# Patient Record
Sex: Male | Born: 1960 | Race: White | Hispanic: No | Marital: Married | Smoking: Former smoker
Health system: Southern US, Community
[De-identification: ages and names within clinical notes are randomized; demographics above are authoritative.]

## PROBLEM LIST (undated history)

## (undated) ENCOUNTER — Emergency Department (HOSPITAL_COMMUNITY): Disposition: A | Discharge status: Home / Self Care | Payer: BC Managed Care – PPO

## (undated) DIAGNOSIS — F17200 Nicotine dependence, unspecified, uncomplicated: Secondary | ICD-10-CM

## (undated) DIAGNOSIS — F1010 Alcohol abuse, uncomplicated: Secondary | ICD-10-CM

## (undated) DIAGNOSIS — K645 Perianal venous thrombosis: Secondary | ICD-10-CM

## (undated) DIAGNOSIS — G4730 Sleep apnea, unspecified: Secondary | ICD-10-CM

## (undated) DIAGNOSIS — L723 Sebaceous cyst: Secondary | ICD-10-CM

## (undated) DIAGNOSIS — J984 Other disorders of lung: Secondary | ICD-10-CM

## (undated) DIAGNOSIS — E079 Disorder of thyroid, unspecified: Secondary | ICD-10-CM

## (undated) DIAGNOSIS — R21 Rash and other nonspecific skin eruption: Secondary | ICD-10-CM

## (undated) DIAGNOSIS — F411 Generalized anxiety disorder: Secondary | ICD-10-CM

## (undated) DIAGNOSIS — I10 Essential (primary) hypertension: Secondary | ICD-10-CM

## (undated) DIAGNOSIS — R05 Cough: Secondary | ICD-10-CM

## (undated) DIAGNOSIS — M79606 Pain in leg, unspecified: Secondary | ICD-10-CM

## (undated) DIAGNOSIS — E785 Hyperlipidemia, unspecified: Secondary | ICD-10-CM

## (undated) DIAGNOSIS — E119 Type 2 diabetes mellitus without complications: Secondary | ICD-10-CM

## (undated) DIAGNOSIS — M109 Gout, unspecified: Secondary | ICD-10-CM

## (undated) DIAGNOSIS — M1990 Unspecified osteoarthritis, unspecified site: Secondary | ICD-10-CM

## (undated) DIAGNOSIS — N498 Inflammatory disorders of other specified male genital organs: Secondary | ICD-10-CM

## (undated) DIAGNOSIS — E039 Hypothyroidism, unspecified: Secondary | ICD-10-CM

## (undated) HISTORY — DX: Type 2 diabetes mellitus without complications: E11.9

## (undated) HISTORY — DX: Disorder of thyroid, unspecified: E07.9

## (undated) HISTORY — DX: Sleep apnea, unspecified: G47.30

## (undated) HISTORY — DX: Unspecified osteoarthritis, unspecified site: M19.90

## (undated) HISTORY — DX: Hyperlipidemia, unspecified: E78.5

## (undated) HISTORY — DX: Pain in leg, unspecified: M79.606

## (undated) HISTORY — DX: Essential (primary) hypertension: I10

## (undated) HISTORY — DX: Inflammatory disorders of other specified male genital organs: N49.8

## (undated) HISTORY — DX: Gout, unspecified: M10.9

## (undated) HISTORY — DX: Sebaceous cyst: L72.3

## (undated) HISTORY — DX: Other disorders of lung: J98.4

## (undated) HISTORY — PX: NASAL SEPTUM SURGERY: SHX37

## (undated) HISTORY — DX: Generalized anxiety disorder: F41.1

## (undated) HISTORY — PX: COLONOSCOPY: SHX174

## (undated) HISTORY — DX: Rash and other nonspecific skin eruption: R21

## (undated) HISTORY — DX: Alcohol abuse, uncomplicated: F10.10

## (undated) HISTORY — DX: Cough: R05

## (undated) HISTORY — DX: Nicotine dependence, unspecified, uncomplicated: F17.200

## (undated) HISTORY — DX: Perianal venous thrombosis: K64.5

## (undated) HISTORY — DX: Hypothyroidism, unspecified: E03.9

---

## 1998-07-04 ENCOUNTER — Other Ambulatory Visit: Admission: RE | Admit: 1998-07-04 | Discharge: 1998-07-04 | Payer: Self-pay | Admitting: Urology

## 1998-11-21 ENCOUNTER — Ambulatory Visit (HOSPITAL_COMMUNITY): Admission: RE | Admit: 1998-11-21 | Discharge: 1998-11-21 | Payer: Self-pay | Admitting: Neurosurgery

## 1998-12-04 ENCOUNTER — Ambulatory Visit (HOSPITAL_COMMUNITY): Admission: RE | Admit: 1998-12-04 | Discharge: 1998-12-04 | Payer: Self-pay | Admitting: Neurosurgery

## 1998-12-19 ENCOUNTER — Ambulatory Visit (HOSPITAL_COMMUNITY): Admission: RE | Admit: 1998-12-19 | Discharge: 1998-12-19 | Payer: Self-pay | Admitting: Neurosurgery

## 1999-03-16 ENCOUNTER — Ambulatory Visit: Admission: RE | Admit: 1999-03-16 | Discharge: 1999-03-16 | Payer: Self-pay | Admitting: Endocrinology

## 1999-12-16 ENCOUNTER — Encounter: Admission: RE | Admit: 1999-12-16 | Discharge: 2000-03-15 | Payer: Self-pay | Admitting: Endocrinology

## 2002-02-14 ENCOUNTER — Ambulatory Visit (HOSPITAL_COMMUNITY): Admission: RE | Admit: 2002-02-14 | Discharge: 2002-02-14 | Payer: Self-pay | Admitting: Endocrinology

## 2002-02-14 ENCOUNTER — Encounter: Payer: Self-pay | Admitting: Endocrinology

## 2002-06-16 ENCOUNTER — Emergency Department (HOSPITAL_COMMUNITY): Admission: EM | Admit: 2002-06-16 | Discharge: 2002-06-16 | Payer: Self-pay | Admitting: Emergency Medicine

## 2002-10-02 ENCOUNTER — Ambulatory Visit (HOSPITAL_COMMUNITY): Admission: RE | Admit: 2002-10-02 | Discharge: 2002-10-02 | Payer: Self-pay | Admitting: Endocrinology

## 2002-10-02 ENCOUNTER — Encounter: Payer: Self-pay | Admitting: Endocrinology

## 2003-06-13 ENCOUNTER — Encounter: Admission: RE | Admit: 2003-06-13 | Discharge: 2003-06-13 | Payer: Self-pay | Admitting: Endocrinology

## 2003-06-26 ENCOUNTER — Ambulatory Visit (HOSPITAL_BASED_OUTPATIENT_CLINIC_OR_DEPARTMENT_OTHER): Admission: RE | Admit: 2003-06-26 | Discharge: 2003-06-26 | Payer: Self-pay | Admitting: Internal Medicine

## 2003-08-29 ENCOUNTER — Ambulatory Visit (HOSPITAL_COMMUNITY): Admission: RE | Admit: 2003-08-29 | Discharge: 2003-08-30 | Payer: Self-pay | Admitting: Otolaryngology

## 2004-04-19 ENCOUNTER — Emergency Department (HOSPITAL_COMMUNITY): Admission: EM | Admit: 2004-04-19 | Discharge: 2004-04-19 | Payer: Self-pay | Admitting: Emergency Medicine

## 2004-07-13 ENCOUNTER — Ambulatory Visit: Payer: Self-pay | Admitting: Endocrinology

## 2004-08-01 ENCOUNTER — Ambulatory Visit: Payer: Self-pay | Admitting: Family Medicine

## 2004-10-17 ENCOUNTER — Ambulatory Visit: Payer: Self-pay | Admitting: Internal Medicine

## 2004-10-30 ENCOUNTER — Ambulatory Visit: Payer: Self-pay | Admitting: Endocrinology

## 2004-11-09 ENCOUNTER — Ambulatory Visit: Payer: Self-pay | Admitting: Professional

## 2004-11-19 ENCOUNTER — Ambulatory Visit: Payer: Self-pay | Admitting: Professional

## 2004-12-10 ENCOUNTER — Ambulatory Visit: Payer: Self-pay | Admitting: Professional

## 2004-12-17 ENCOUNTER — Ambulatory Visit: Payer: Self-pay | Admitting: Professional

## 2005-01-14 ENCOUNTER — Ambulatory Visit: Payer: Self-pay | Admitting: Professional

## 2005-01-26 ENCOUNTER — Ambulatory Visit: Payer: Self-pay | Admitting: Endocrinology

## 2005-01-28 ENCOUNTER — Ambulatory Visit: Payer: Self-pay | Admitting: Professional

## 2005-02-10 ENCOUNTER — Ambulatory Visit: Payer: Self-pay | Admitting: Critical Care Medicine

## 2005-03-26 ENCOUNTER — Ambulatory Visit: Payer: Self-pay | Admitting: Endocrinology

## 2005-03-29 ENCOUNTER — Ambulatory Visit: Payer: Self-pay | Admitting: Endocrinology

## 2005-04-29 ENCOUNTER — Ambulatory Visit: Payer: Self-pay | Admitting: Endocrinology

## 2005-11-08 ENCOUNTER — Ambulatory Visit: Payer: Self-pay | Admitting: Endocrinology

## 2005-11-10 ENCOUNTER — Ambulatory Visit: Payer: Self-pay | Admitting: Endocrinology

## 2006-07-05 ENCOUNTER — Ambulatory Visit: Payer: Self-pay | Admitting: Endocrinology

## 2006-07-05 LAB — CONVERTED CEMR LAB
AST: 42 units/L — ABNORMAL HIGH (ref 0–37)
Albumin: 4 g/dL (ref 3.5–5.2)
BUN: 7 mg/dL (ref 6–23)
Basophils Absolute: 0.1 10*3/uL (ref 0.0–0.1)
Bilirubin Urine: NEGATIVE
CO2: 28 meq/L (ref 19–32)
Chloride: 101 meq/L (ref 96–112)
Eosinophil percent: 3 % (ref 0.0–5.0)
Glomerular Filtration Rate, Af Am: 157 mL/min/{1.73_m2}
HCT: 43.1 % (ref 39.0–52.0)
Hemoglobin, Urine: NEGATIVE
Ketones, ur: NEGATIVE mg/dL
Leukocytes, UA: NEGATIVE
Lymphocytes Relative: 24.7 % (ref 12.0–46.0)
MCV: 88.3 fL (ref 78.0–100.0)
Neutro Abs: 5.3 10*3/uL (ref 1.4–7.7)
Platelets: 244 10*3/uL (ref 150–400)
RDW: 12.5 % (ref 11.5–14.6)
Sodium: 138 meq/L (ref 135–145)
TSH: 3.61 microintl units/mL (ref 0.35–5.50)
Total Protein: 6.6 g/dL (ref 6.0–8.3)
Urine Glucose: NEGATIVE mg/dL
Urobilinogen, UA: 0.2 (ref 0.0–1.0)
VLDL: 79 mg/dL — ABNORMAL HIGH (ref 0–40)

## 2006-07-11 ENCOUNTER — Ambulatory Visit: Payer: Self-pay | Admitting: Endocrinology

## 2006-11-16 ENCOUNTER — Ambulatory Visit: Payer: Self-pay | Admitting: Endocrinology

## 2007-04-11 ENCOUNTER — Ambulatory Visit: Payer: Self-pay | Admitting: Endocrinology

## 2007-05-31 ENCOUNTER — Ambulatory Visit: Payer: Self-pay | Admitting: Endocrinology

## 2007-05-31 LAB — CONVERTED CEMR LAB
ALT: 58 units/L — ABNORMAL HIGH (ref 0–53)
Alkaline Phosphatase: 66 units/L (ref 39–117)
Calcium: 9.1 mg/dL (ref 8.4–10.5)
Creatinine, Ser: 0.6 mg/dL (ref 0.4–1.5)
Direct LDL: 53 mg/dL
Glucose, Bld: 190 mg/dL — ABNORMAL HIGH (ref 70–99)
Hgb A1c MFr Bld: 9 % — ABNORMAL HIGH (ref 4.6–6.0)
Potassium: 4.1 meq/L (ref 3.5–5.1)
TSH: 6.39 microintl units/mL — ABNORMAL HIGH (ref 0.35–5.50)
Total Bilirubin: 1.4 mg/dL — ABNORMAL HIGH (ref 0.3–1.2)
Total Protein: 6.6 g/dL (ref 6.0–8.3)
Triglycerides: 441 mg/dL (ref 0–149)
VLDL: 88 mg/dL — ABNORMAL HIGH (ref 0–40)

## 2007-06-02 ENCOUNTER — Ambulatory Visit: Payer: Self-pay | Admitting: Endocrinology

## 2007-06-07 ENCOUNTER — Telehealth (INDEPENDENT_AMBULATORY_CARE_PROVIDER_SITE_OTHER): Payer: Self-pay | Admitting: *Deleted

## 2007-08-21 ENCOUNTER — Encounter: Payer: Self-pay | Admitting: Endocrinology

## 2007-09-19 ENCOUNTER — Ambulatory Visit: Payer: Self-pay | Admitting: Endocrinology

## 2007-09-19 DIAGNOSIS — E119 Type 2 diabetes mellitus without complications: Secondary | ICD-10-CM

## 2007-09-19 DIAGNOSIS — I10 Essential (primary) hypertension: Secondary | ICD-10-CM

## 2007-09-19 DIAGNOSIS — E039 Hypothyroidism, unspecified: Secondary | ICD-10-CM

## 2007-09-19 DIAGNOSIS — F418 Other specified anxiety disorders: Secondary | ICD-10-CM | POA: Insufficient documentation

## 2007-09-19 DIAGNOSIS — F411 Generalized anxiety disorder: Secondary | ICD-10-CM

## 2007-09-19 DIAGNOSIS — M1990 Unspecified osteoarthritis, unspecified site: Secondary | ICD-10-CM | POA: Insufficient documentation

## 2007-09-19 DIAGNOSIS — E785 Hyperlipidemia, unspecified: Secondary | ICD-10-CM

## 2007-09-19 DIAGNOSIS — R21 Rash and other nonspecific skin eruption: Secondary | ICD-10-CM | POA: Insufficient documentation

## 2007-09-19 HISTORY — DX: Hyperlipidemia, unspecified: E78.5

## 2007-09-19 HISTORY — DX: Generalized anxiety disorder: F41.1

## 2007-09-19 HISTORY — DX: Type 2 diabetes mellitus without complications: E11.9

## 2007-09-19 HISTORY — DX: Rash and other nonspecific skin eruption: R21

## 2007-09-19 HISTORY — DX: Essential (primary) hypertension: I10

## 2007-09-19 HISTORY — DX: Hypothyroidism, unspecified: E03.9

## 2007-09-19 HISTORY — DX: Unspecified osteoarthritis, unspecified site: M19.90

## 2007-10-06 ENCOUNTER — Telehealth (INDEPENDENT_AMBULATORY_CARE_PROVIDER_SITE_OTHER): Payer: Self-pay | Admitting: *Deleted

## 2007-10-09 ENCOUNTER — Telehealth (INDEPENDENT_AMBULATORY_CARE_PROVIDER_SITE_OTHER): Payer: Self-pay | Admitting: *Deleted

## 2007-10-24 ENCOUNTER — Encounter: Payer: Self-pay | Admitting: Endocrinology

## 2008-04-06 ENCOUNTER — Encounter: Payer: Self-pay | Admitting: Endocrinology

## 2008-04-08 ENCOUNTER — Telehealth (INDEPENDENT_AMBULATORY_CARE_PROVIDER_SITE_OTHER): Payer: Self-pay | Admitting: *Deleted

## 2008-04-11 ENCOUNTER — Ambulatory Visit: Payer: Self-pay | Admitting: Endocrinology

## 2008-05-16 ENCOUNTER — Telehealth (INDEPENDENT_AMBULATORY_CARE_PROVIDER_SITE_OTHER): Payer: Self-pay | Admitting: *Deleted

## 2008-05-20 ENCOUNTER — Telehealth (INDEPENDENT_AMBULATORY_CARE_PROVIDER_SITE_OTHER): Payer: Self-pay | Admitting: *Deleted

## 2008-05-22 ENCOUNTER — Ambulatory Visit: Payer: Self-pay | Admitting: Endocrinology

## 2008-05-22 DIAGNOSIS — R05 Cough: Secondary | ICD-10-CM

## 2008-05-22 DIAGNOSIS — R059 Cough, unspecified: Secondary | ICD-10-CM

## 2008-05-22 HISTORY — DX: Cough, unspecified: R05.9

## 2008-05-28 ENCOUNTER — Encounter (INDEPENDENT_AMBULATORY_CARE_PROVIDER_SITE_OTHER): Payer: Self-pay | Admitting: *Deleted

## 2008-05-30 ENCOUNTER — Ambulatory Visit: Payer: Self-pay | Admitting: Endocrinology

## 2008-06-02 LAB — CONVERTED CEMR LAB: Hgb A1c MFr Bld: 11.3 % — ABNORMAL HIGH

## 2008-06-28 ENCOUNTER — Ambulatory Visit: Payer: Self-pay | Admitting: Endocrinology

## 2008-07-05 ENCOUNTER — Telehealth (INDEPENDENT_AMBULATORY_CARE_PROVIDER_SITE_OTHER): Payer: Self-pay | Admitting: *Deleted

## 2008-08-01 ENCOUNTER — Ambulatory Visit: Payer: Self-pay | Admitting: Endocrinology

## 2008-08-01 ENCOUNTER — Encounter: Payer: Self-pay | Admitting: Internal Medicine

## 2008-08-01 DIAGNOSIS — L723 Sebaceous cyst: Secondary | ICD-10-CM

## 2008-08-01 HISTORY — DX: Sebaceous cyst: L72.3

## 2008-08-23 ENCOUNTER — Ambulatory Visit: Payer: Self-pay | Admitting: Internal Medicine

## 2008-08-23 DIAGNOSIS — K645 Perianal venous thrombosis: Secondary | ICD-10-CM

## 2008-08-23 HISTORY — DX: Perianal venous thrombosis: K64.5

## 2008-09-17 ENCOUNTER — Telehealth (INDEPENDENT_AMBULATORY_CARE_PROVIDER_SITE_OTHER): Payer: Self-pay | Admitting: *Deleted

## 2008-09-26 ENCOUNTER — Encounter: Payer: Self-pay | Admitting: Endocrinology

## 2008-10-09 ENCOUNTER — Telehealth (INDEPENDENT_AMBULATORY_CARE_PROVIDER_SITE_OTHER): Payer: Self-pay | Admitting: *Deleted

## 2008-10-10 ENCOUNTER — Telehealth (INDEPENDENT_AMBULATORY_CARE_PROVIDER_SITE_OTHER): Payer: Self-pay | Admitting: *Deleted

## 2008-10-22 ENCOUNTER — Telehealth: Payer: Self-pay | Admitting: Endocrinology

## 2008-10-22 ENCOUNTER — Ambulatory Visit: Payer: Self-pay | Admitting: Endocrinology

## 2008-10-23 LAB — CONVERTED CEMR LAB
Cholesterol: 174 mg/dL (ref 0–200)
Direct LDL: 53.7 mg/dL
Hgb A1c MFr Bld: 10.2 % — ABNORMAL HIGH (ref 4.6–6.0)
Triglycerides: 670 mg/dL (ref 0–149)
VLDL: 134 mg/dL — ABNORMAL HIGH (ref 0–40)

## 2008-10-24 ENCOUNTER — Ambulatory Visit: Payer: Self-pay | Admitting: Endocrinology

## 2008-10-24 DIAGNOSIS — F1010 Alcohol abuse, uncomplicated: Secondary | ICD-10-CM

## 2008-10-24 HISTORY — DX: Alcohol abuse, uncomplicated: F10.10

## 2008-11-04 ENCOUNTER — Telehealth (INDEPENDENT_AMBULATORY_CARE_PROVIDER_SITE_OTHER): Payer: Self-pay | Admitting: *Deleted

## 2008-11-18 ENCOUNTER — Telehealth (INDEPENDENT_AMBULATORY_CARE_PROVIDER_SITE_OTHER): Payer: Self-pay | Admitting: *Deleted

## 2008-12-27 ENCOUNTER — Telehealth (INDEPENDENT_AMBULATORY_CARE_PROVIDER_SITE_OTHER): Payer: Self-pay | Admitting: *Deleted

## 2009-01-09 ENCOUNTER — Telehealth: Payer: Self-pay | Admitting: Endocrinology

## 2009-01-31 ENCOUNTER — Telehealth (INDEPENDENT_AMBULATORY_CARE_PROVIDER_SITE_OTHER): Payer: Self-pay | Admitting: *Deleted

## 2009-01-31 ENCOUNTER — Ambulatory Visit: Payer: Self-pay | Admitting: Endocrinology

## 2009-02-01 LAB — CONVERTED CEMR LAB
Basophils Relative: 0 % (ref 0.0–3.0)
Bilirubin Urine: NEGATIVE
CO2: 29 meq/L (ref 19–32)
Cholesterol: 161 mg/dL (ref 0–200)
Direct LDL: 48.8 mg/dL
Eosinophils Absolute: 0.3 10*3/uL (ref 0.0–0.7)
Eosinophils Relative: 4.2 % (ref 0.0–5.0)
GFR calc non Af Amer: 152.86 mL/min (ref >60)
Glucose, Bld: 141 mg/dL — ABNORMAL HIGH (ref 70–99)
HCT: 41.7 % (ref 39.0–52.0)
Hemoglobin, Urine: NEGATIVE
Hemoglobin: 14.9 g/dL (ref 13.0–17.0)
Monocytes Relative: 7.9 % (ref 3.0–12.0)
Neutro Abs: 3.6 10*3/uL (ref 1.4–7.7)
PSA: 3.88 ng/mL (ref 0.10–4.00)
Platelets: 191 10*3/uL (ref 150.0–400.0)
Potassium: 3.8 meq/L (ref 3.5–5.1)
RDW: 12.7 % (ref 11.5–14.6)
TSH: 8.47 microintl units/mL — ABNORMAL HIGH (ref 0.35–5.50)
Total CHOL/HDL Ratio: 4
Triglycerides: 746 mg/dL — ABNORMAL HIGH (ref 0.0–149.0)
Urine Glucose: 250 mg/dL
WBC: 6.5 10*3/uL (ref 4.5–10.5)

## 2009-02-03 ENCOUNTER — Ambulatory Visit: Payer: Self-pay | Admitting: Endocrinology

## 2009-05-30 ENCOUNTER — Telehealth: Payer: Self-pay | Admitting: Endocrinology

## 2009-06-13 ENCOUNTER — Telehealth: Payer: Self-pay | Admitting: Endocrinology

## 2009-08-21 ENCOUNTER — Telehealth: Payer: Self-pay | Admitting: Endocrinology

## 2009-08-22 ENCOUNTER — Telehealth: Payer: Self-pay | Admitting: Endocrinology

## 2009-08-22 DIAGNOSIS — G4730 Sleep apnea, unspecified: Secondary | ICD-10-CM | POA: Insufficient documentation

## 2009-08-22 HISTORY — DX: Sleep apnea, unspecified: G47.30

## 2009-09-02 ENCOUNTER — Ambulatory Visit: Payer: Self-pay | Admitting: Pulmonary Disease

## 2009-09-02 ENCOUNTER — Telehealth: Payer: Self-pay | Admitting: Internal Medicine

## 2009-09-12 ENCOUNTER — Ambulatory Visit: Payer: Self-pay | Admitting: Endocrinology

## 2009-09-12 LAB — CONVERTED CEMR LAB
Cholesterol: 134 mg/dL (ref 0–200)
Triglycerides: 447 mg/dL — ABNORMAL HIGH (ref 0.0–149.0)
VLDL: 89.4 mg/dL — ABNORMAL HIGH (ref 0.0–40.0)

## 2009-09-17 ENCOUNTER — Ambulatory Visit: Payer: Self-pay | Admitting: Endocrinology

## 2009-10-16 ENCOUNTER — Ambulatory Visit: Payer: Self-pay | Admitting: Endocrinology

## 2010-01-05 ENCOUNTER — Telehealth: Payer: Self-pay | Admitting: Endocrinology

## 2010-01-19 ENCOUNTER — Telehealth: Payer: Self-pay | Admitting: Endocrinology

## 2010-01-22 ENCOUNTER — Telehealth: Payer: Self-pay | Admitting: Endocrinology

## 2010-03-23 ENCOUNTER — Telehealth: Payer: Self-pay | Admitting: Endocrinology

## 2010-03-27 ENCOUNTER — Telehealth: Payer: Self-pay | Admitting: Endocrinology

## 2010-04-14 ENCOUNTER — Telehealth: Payer: Self-pay | Admitting: Endocrinology

## 2010-04-16 ENCOUNTER — Ambulatory Visit: Payer: Self-pay | Admitting: Endocrinology

## 2010-04-16 ENCOUNTER — Telehealth: Payer: Self-pay | Admitting: Endocrinology

## 2010-04-16 LAB — CONVERTED CEMR LAB
Cholesterol: 159 mg/dL (ref 0–200)
Direct LDL: 72 mg/dL
Hgb A1c MFr Bld: 10.6 % — ABNORMAL HIGH (ref 4.6–6.5)
Triglycerides: 479 mg/dL — ABNORMAL HIGH (ref 0.0–149.0)
VLDL: 95.8 mg/dL — ABNORMAL HIGH (ref 0.0–40.0)

## 2010-04-17 ENCOUNTER — Ambulatory Visit: Payer: Self-pay | Admitting: Endocrinology

## 2010-04-17 ENCOUNTER — Encounter: Payer: Self-pay | Admitting: Endocrinology

## 2010-04-17 DIAGNOSIS — M109 Gout, unspecified: Secondary | ICD-10-CM

## 2010-04-17 DIAGNOSIS — F17200 Nicotine dependence, unspecified, uncomplicated: Secondary | ICD-10-CM

## 2010-04-17 HISTORY — DX: Nicotine dependence, unspecified, uncomplicated: F17.200

## 2010-04-17 HISTORY — DX: Gout, unspecified: M10.9

## 2010-04-30 ENCOUNTER — Telehealth: Payer: Self-pay | Admitting: Endocrinology

## 2010-05-27 ENCOUNTER — Ambulatory Visit: Payer: Self-pay | Admitting: Endocrinology

## 2010-05-27 DIAGNOSIS — N498 Inflammatory disorders of other specified male genital organs: Secondary | ICD-10-CM | POA: Insufficient documentation

## 2010-05-27 HISTORY — DX: Inflammatory disorders of other specified male genital organs: N49.8

## 2010-06-10 ENCOUNTER — Encounter: Payer: Self-pay | Admitting: Endocrinology

## 2010-06-10 LAB — CONVERTED CEMR LAB: Uric Acid, Serum: 7 mg/dL (ref 4.0–7.8)

## 2010-06-11 LAB — CONVERTED CEMR LAB: Fructosamine: 345 micromoles/L — ABNORMAL HIGH (ref <285)

## 2010-07-16 ENCOUNTER — Ambulatory Visit: Payer: Self-pay | Admitting: Endocrinology

## 2010-07-16 LAB — CONVERTED CEMR LAB
ALT: 27 units/L (ref 0–53)
BUN: 15 mg/dL (ref 6–23)
Basophils Absolute: 0 10*3/uL (ref 0.0–0.1)
Bilirubin Urine: NEGATIVE
Bilirubin, Direct: 0.1 mg/dL (ref 0.0–0.3)
Chloride: 96 meq/L (ref 96–112)
Cholesterol: 163 mg/dL (ref 0–200)
Creatinine, Ser: 0.7 mg/dL (ref 0.4–1.5)
Direct LDL: 63.5 mg/dL
Eosinophils Absolute: 0.2 10*3/uL (ref 0.0–0.7)
Eosinophils Relative: 2.9 % (ref 0.0–5.0)
Glucose, Bld: 277 mg/dL — ABNORMAL HIGH (ref 70–99)
HCT: 43.2 % (ref 39.0–52.0)
HDL: 37 mg/dL — ABNORMAL LOW (ref >39.00)
Leukocytes, UA: NEGATIVE
Lymphs Abs: 2.2 10*3/uL (ref 0.7–4.0)
MCV: 86.6 fL (ref 78.0–100.0)
Monocytes Absolute: 0.7 10*3/uL (ref 0.1–1.0)
Neutrophils Relative %: 61.5 % (ref 43.0–77.0)
Nitrite: NEGATIVE
PSA: 3.66 ng/mL (ref 0.10–4.00)
Platelets: 243 10*3/uL (ref 150.0–400.0)
RDW: 13.4 % (ref 11.5–14.6)
Specific Gravity, Urine: 1.02 (ref 1.000–1.030)
TSH: 3.86 microintl units/mL (ref 0.35–5.50)
Total Bilirubin: 0.6 mg/dL (ref 0.3–1.2)
VLDL: 115.8 mg/dL — ABNORMAL HIGH (ref 0.0–40.0)
WBC: 8.1 10*3/uL (ref 4.5–10.5)
pH: 6 (ref 5.0–8.0)

## 2010-07-20 ENCOUNTER — Encounter: Payer: Self-pay | Admitting: Internal Medicine

## 2010-07-20 ENCOUNTER — Ambulatory Visit: Payer: Self-pay | Admitting: Endocrinology

## 2010-07-20 DIAGNOSIS — R0989 Other specified symptoms and signs involving the circulatory and respiratory systems: Secondary | ICD-10-CM | POA: Insufficient documentation

## 2010-07-20 DIAGNOSIS — J984 Other disorders of lung: Secondary | ICD-10-CM

## 2010-07-20 HISTORY — DX: Other disorders of lung: J98.4

## 2010-09-13 LAB — CONVERTED CEMR LAB: TSH: 10.28 microintl units/mL — ABNORMAL HIGH (ref 0.35–5.50)

## 2010-09-15 NOTE — Progress Notes (Signed)
Summary: C-pap replacement mask  Phone Note Call from Patient Call back at Home Phone [phone]   Summary of Call: Patient called today regarding his c-pap mask. Patient needs a replacement mask and cannot remember what MD he saw in pulmonary a few years ago. Patient would like [place] to provide prescription for replacement mask to [facility]. Please advise. Initial call taken by: Lucious Groves,  August 22, 2009 3:26 PM  Follow-up for Phone Call        the mask is outside of the scope of my practice.  i requested appt back in pulmonary.  also, you are due to return here for f/u appt. Follow-up by: Minus Breeding MD,  August 22, 2009 3:50 PM  Additional Follow-up for Phone Call Additional follow up Details #1::        Left message on voicemail to call back to office. Additional Follow-up by: Lucious Groves,  August 25, 2009 10:47 AM  New Problems: SLEEP APNEA (ICD-780.57)   Additional Follow-up for Phone Call Additional follow up Details #2::    pt informed by [facility] while being told about appt with Pulmo Follow-up by: Margaret Pyle, CMA,  August 25, 2009 11:09 AM  New Problems: SLEEP APNEA (ICD-780.57)

## 2010-09-15 NOTE — Progress Notes (Signed)
Summary: metoprolol  Phone Note Refill Request Message from:  Fax from Pharmacy  Refills Requested: Medication #1:  METOPROLOL TARTRATE 50 MG TABS take 1 by mouth two times a day  Need physical appt for addtional refills   Dosage confirmed as above?Dosage Confirmed   Notes: 90 day supply Johnny Andrews [phone] please advise?   Method Requested: Electronic Initial call taken by: Brenton Grills MA,  March 23, 2010 3:26 PM  Follow-up for Phone Call        refill x 1 ov due Follow-up by: Minus Breeding MD,  March 23, 2010 3:38 PM  Additional Follow-up for Phone Call Additional follow up Details #1::        Appt scheduled 04/17/10 10:00am Additional Follow-up by: Brenton Grills MA,  March 23, 2010 3:48 PM    Prescriptions: METOPROLOL TARTRATE 50 MG TABS (METOPROLOL TARTRATE) take 1 by mouth two times a day  Need physical appt for addtional refills  #180 x 0   Entered by:   Brenton Grills MA   Authorized by:   Minus Breeding MD   Signed by:   Brenton Grills MA on 03/23/2010   Method used:   Electronically to        [facility] (retail)       [address]       [place], [place]  [address]       Ph: [phone]       Fax: [phone]   RxID:   [id number]

## 2010-09-15 NOTE — Assessment & Plan Note (Signed)
Summary: painful lump, cyst side leg/sae/cd   Vital Signs:  Patient profile:   50 year old male Height:      74 inches (187.96 cm) Weight:      338.13 pounds (153.70 kg) BMI:     43.57 O2 Sat:      96 % on Room air Temp:     98.5 degrees F (36.94 degrees C) oral Pulse rate:   96 / minute BP sitting:   148 / 86  (left arm) Cuff size:   large  Vitals Entered By: Brenton Grills MA (May 27, 2010 9:21 AM)  O2 Flow:  Room air CC: Pt c/o cyst between legs/aj Is Patient Diabetic? Yes   Referring Provider:  pcp Primary Provider:  Minus Breeding MD  CC:  Pt c/o cyst between legs/aj.  History of Present Illness: pt states few days of slight "bump," at the right scrotum, and assoc pain.  he gets 1 episode/year, of these infections.   he also has diffuse arthralgias, worst at the right shoulder, and pain at the left foot. no cbg record, but states cbg's are "all in the 100's."  he has successfully reduced his consumption of his alcohol.  Current Medications (verified): 1)  Diclofenac Sodium 75 Mg Tbec (Diclofenac Sodium) .... Two Times A Day As Needed Pain 2)  Metoprolol Tartrate 50 Mg Tabs (Metoprolol Tartrate) .... Take 1 By Mouth Two Times A Day  Need Physical Appt For Addtional Refills 3)  Alprazolam 0.25 Mg  Tabs (Alprazolam) .... 2 At Bedtime As Needed Sleep 4)  Prilosec Otc 20 Mg Tbec (Omeprazole Magnesium) .... Take Prn 5)  Levothyroxine Sodium 150 Mcg Tabs (Levothyroxine Sodium) .Marland Kitchen.. 1 Qd 6)  Bd Pen Needle Short U/f 31g X 8 Mm Misc (Insulin Pen Needle) .... 2/day 7)  Levemir Flexpen 100 Unit/ml Soln (Insulin Detemir) .... 280 Units Each Am 8)  Simvastatin 80 Mg Tabs (Simvastatin) .... 1/2 Tab Qhs 9)  Onetouch Ultra Test  Strp (Glucose Blood) .... Two Times A Day, and Lancets 250.01 10)  Fenofibrate 54 Mg Tabs (Fenofibrate) .Marland Kitchen.. 1 Once Daily  Allergies (verified): No Known Drug Allergies  Past History:  Past Medical History: Last updated: 08/01/2008 COUGH  (ICD-786.2) RASH AND OTHER NONSPECIFIC SKIN ERUPTION (ICD-782.1) HYPOTHYROIDISM (ICD-244.9) OSTEOARTHRITIS (ICD-715.90) HYPERTENSION (ICD-401.9) HYPERLIPIDEMIA (ICD-272.4) DIABETES MELLITUS, TYPE II (ICD-250.00) ANXIETY (ICD-300.00)  Review of Systems  The patient denies fever.         the right scrotum lesion is draining for the past day.    Physical Exam  General:  obese.  no distress  Genitalia:  right scrotum:  there is a 5 cm draining abscess.  no surrounding erythema.   Impression & Recommendations:  Problem # 1:  SCROTAL ABSCESS (ICD-608.4) Assessment New  Problem # 2:  DIABETES MELLITUS, TYPE II (ICD-250.00) Assessment: Improved  Problem # 3:  arthralgias uncertain etiology  Medications Added to Medication List This Visit: 1)  Doxycycline Hyclate 100 Mg Caps (Doxycycline hyclate) .Marland Kitchen.. 1 tab two times a day  Other Orders: TLB-Uric Acid, Blood (84550-URIC) T-Fructosamine (87564-33295) Est. Patient Level IV (18841)  Patient Instructions: 1)  take warm baths at least once daily 2)  doxycycline 100 mg two times a day 3)  blood tests are being ordered for you today.  please call [phone] to hear your test results. 4)  Please schedule a follow-up appointment in 1 month. Prescriptions: DOXYCYCLINE HYCLATE 100 MG CAPS (DOXYCYCLINE HYCLATE) 1 tab two times a day  #14 x 0  Entered and Authorized by:   Minus Breeding MD   Signed by:   Minus Breeding MD on 05/27/2010   Method used:   [facility] to        [facility] [phone][facility] (retail)       [address]       [place], [place]  [address]       Ph: [phone] or [phone]       Fax: [phone]   RxID:   [id number]

## 2010-09-15 NOTE — Progress Notes (Signed)
Summary: Rx refill req  Phone Note Call from Patient Call back at Home Phone [phone]   Caller: Patient Summary of Call: pt called stating that he was given samples of Benicar at last OV in place of Hyzaar. Pt was not given prescriptions and is requesting Rx [facility] [address] Initial call taken by: Margaret Pyle, CMA,  Jan 05, 2010 3:28 PM    New/Updated Medications: BENICAR HCT 40-25 MG TABS (OLMESARTAN MEDOXOMIL-HCTZ) 1 by mouth once daily Prescriptions: BENICAR HCT 40-25 MG TABS (OLMESARTAN MEDOXOMIL-HCTZ) 1 by mouth once daily  #30 x 11   Entered by:   Margaret Pyle, CMA   Authorized by:   Minus Breeding MD   Signed by:   Margaret Pyle, CMA on 01/05/2010   Method used:   Electronically to        [facility] [phone]* (retail)       [address]       [place], [place]  [address]       Ph: [phone] or [phone]       Fax: [phone]   RxID:   [id number]

## 2010-09-15 NOTE — Progress Notes (Signed)
Summary: Xanax/ Dr. Everardo All pt  Phone Note Call from Patient Call back at Home Phone [phone]   Caller: Patient Summary of Call: pt called requestin prior labs to [facility] 02.02.2011. Labs scheduled for 01.27.2011 (fasting). Pt also requests refill of Xanax. Okay to fill? Initial call taken by: Margaret Pyle, CMA,  September 02, 2009 10:55 AM  Follow-up for Phone Call        done hardcopy to LIM side B - dahlia  Follow-up by: Corwin Levins MD,  September 02, 2009 12:03 PM  Additional Follow-up for Phone Call Additional follow up Details #1::        pt informed via VM, rx faxed to pharmacy  Additional Follow-up by: Margaret Pyle, CMA,  September 02, 2009 1:15 PM    New/Updated Medications: ALPRAZOLAM 0.25 MG  TABS (ALPRAZOLAM) 2 at bedtime as needed sleep Prescriptions: ALPRAZOLAM 0.25 MG  TABS (ALPRAZOLAM) 2 at bedtime as needed sleep  #60 x 5   Entered and Authorized by:   Corwin Levins MD   Signed by:   Corwin Levins MD on 09/02/2009   Method used:   Print then Give to Patient   RxID:   [id number]

## 2010-09-15 NOTE — Progress Notes (Signed)
Summary: Benicar PA  Phone Note From Pharmacy   Details of Request: [facility] [phone] YT:[id number] Summary of Call: PA request--Benicar. Usually the preferred meds are Losartan, Diovan, and Micardis. Please advise. Initial call taken by: Lucious Groves,  January 22, 2010 8:46 AM  Follow-up for Phone Call        ov is due.  we'll address then Follow-up by: Minus Breeding MD,  January 22, 2010 9:27 AM  Additional Follow-up for Phone Call Additional follow up Details #1::        pt advised via VM to schedule appt. Can discuss PA/ ALT med for Benicar at OV. Additional Follow-up by: Margaret Pyle, CMA,  January 22, 2010 9:31 AM

## 2010-09-15 NOTE — Assessment & Plan Note (Signed)
Summary: UNDER ARM SWOLLEN AREA  STC   Vital Signs:  Patient profile:   50 year old male Height:      74 inches (187.96 cm) Weight:      360.38 pounds (163.81 kg) O2 Sat:      98 % on Room air Temp:     96.4 degrees F (35.78 degrees C) oral Pulse rate:   95 / minute BP sitting:   140 / 90  (left arm) Cuff size:   large  Vitals Entered By: Sydell Axon (October 16, 2009 10:49 AM)  O2 Flow:  Room air CC: swollen area under r arm   Referring Provider:  pcp Primary Provider:  Minus Breeding MD  CC:  swollen area under r arm.  History of Present Illness: pt states of 1 week of slight pain at the right axilla, and associated pain. pt states his hemorrhoids are bleeding again x a few days, and slight associated pain. pt says he ran out of test strips, but says it is "over 200."  Current Medications (verified): 1)  Diclofenac Sodium 75 Mg Tbec (Diclofenac Sodium) .... Two Times A Day As Needed Pain 2)  Metoprolol Tartrate 50 Mg Tabs (Metoprolol Tartrate) .... Take 1 By Mouth Two Times A Day  Need Physical Appt For Addtional Refills 3)  Hyzaar 100-25 Mg  Tabs (Losartan Potassium-Hctz) .... Take 1 By Mouth Qd 4)  Alprazolam 0.25 Mg  Tabs (Alprazolam) .... 2 At Bedtime As Needed Sleep 5)  Prilosec Otc 20 Mg Tbec (Omeprazole Magnesium) .... Take Prn 6)  Levothyroxine Sodium 150 Mcg Tabs (Levothyroxine Sodium) .Marland Kitchen.. 1 Qd 7)  Bd Pen Needle Short U/f 31g X 8 Mm Misc (Insulin Pen Needle) .... 2/day 8)  Levemir Flexpen 100 Unit/ml Soln (Insulin Detemir) .... 225 Units Each Am 9)  Simvastatin 80 Mg Tabs (Simvastatin) .... 1/2 Tab Qhs 10)  Fenofibrate Micronized 67 Mg Caps (Fenofibrate Micronized) .Marland Kitchen.. 1 Qd  Allergies (verified): No Known Drug Allergies  Past History:  Past Medical History: Last updated: 08/01/2008 COUGH (ICD-786.2) RASH AND OTHER NONSPECIFIC SKIN ERUPTION (ICD-782.1) HYPOTHYROIDISM (ICD-244.9) OSTEOARTHRITIS (ICD-715.90) HYPERTENSION (ICD-401.9) HYPERLIPIDEMIA  (ICD-272.4) DIABETES MELLITUS, TYPE II (ICD-250.00) ANXIETY (ICD-300.00)  Review of Systems  The patient denies hypoglycemia and fever.    Physical Exam  General:  morbidly obese.  no distress  Skin:  right anxilla: has 6x2 cm area of slight swelling/tenderness/erythema.  no fluctuance or drainage.   Impression & Recommendations:  Problem # 1:  cellulitis with ? small pustule, at the right axilla  Problem # 2:  DIABETES MELLITUS, TYPE II (ICD-250.00)  Orders: Est. Patient Level IV (28315)  Problem # 3:  hemorrhoids recurrent  Medications Added to Medication List This Visit: 1)  Levemir Flexpen 100 Unit/ml Soln (Insulin detemir) .... 280 units each am 2)  Doxycycline Hyclate 100 Mg Caps (Doxycycline hyclate) .Marland Kitchen.. 1 two times a day 3)  Anusol-hc 2.5 % Crea (Hydrocortisone) .... Three times a day as needed for hemorrhoids 4)  Onetouch Ultra Test Strp (Glucose blood) .... Two times a day, and lancets 250.01   Patient Instructions: 1)  same medications for blood pressure. 2)  increase levemir to 280 units each am. 3)  Please schedule a follow-up appointment in 6 weeks. 4)  doxycycline 100 mg two times a day.   5)  return sooner if the right armpit area gets worse.  keep the area clean as best you can. 6)  anusol-hc cream three times a day as needed  for hemorrhoids. 7)  take metamucil, stool softener, and citrucel daily. 8)  check your blood sugar 2 times a day.  vary the time of day when you check, between before the 3 meals, and at bedtime.  also check if you have symptoms of your blood sugar being too high or too low.  please keep a record of the readings and bring it to your next appointment here.  please call us sooner if you are having low blood sugar episodes. 9)  here are some samples of benicar-hct 40/25, to take 1 once daily, instead of hyzaar Prescriptions: LEVEMIR FLEXPEN 100 UNIT/ML SOLN (INSULIN DETEMIR) 280 units each am  #7 boxes x 11   Entered and Authorized  by:   Minus Breeding MD   Signed by:   Minus Breeding MD on 10/16/2009   Method used:   Electronically to        [facility] [phone]* (retail)       [address]       [place], [place]  [address]       Ph: [phone] or [phone]       Fax: [phone]   RxID:   [id number] ONETOUCH ULTRA TEST  STRP (GLUCOSE BLOOD) two times a day, and lancets 250.01  #100 x 11   Entered and Authorized by:   Minus Breeding MD   Signed by:   Minus Breeding MD on 10/16/2009   Method used:   Electronically to        [facility] [phone]* (retail)       [address]       [place], [place]  [address]       Ph: [phone] or [phone]       Fax: [phone]   RxID:   [id number] ANUSOL-HC 2.5 % CREA (HYDROCORTISONE) three times a day as needed for hemorrhoids  #1 med tube x 2   Entered and Authorized by:   Minus Breeding MD   Signed by:   Minus Breeding MD on 10/16/2009   Method used:   Electronically to        [facility] [phone]* (retail)       [address]       [place], [place]  [address]       Ph: [phone] or [phone]       Fax: [phone]   RxID:   [id number] DOXYCYCLINE HYCLATE 100 MG CAPS (DOXYCYCLINE HYCLATE) 1 two times a day  #20 x 0   Entered and Authorized by:   Minus Breeding MD   Signed by:   Minus Breeding MD on 10/16/2009   Method used:   Electronically to        [facility] [phone]* (retail)       [address]       [place], [place]  [address]       Ph: [phone] or [phone]       Fax: [phone]   RxID:   [id number]

## 2010-09-15 NOTE — Progress Notes (Signed)
  Phone Note Refill Request Message from:  Fax from Pharmacy on August 21, 2009 2:39 PM  Refills Requested: Medication #1:  SIMVASTATIN 80 MG TABS TAKE 1 by mouth QD   Dosage confirmed as above?Dosage Confirmed Initial call taken by: Josph Macho CMA,  August 21, 2009 2:39 PM    Prescriptions: SIMVASTATIN 80 MG TABS (SIMVASTATIN) TAKE 1 by mouth QD  #90 x 2   Entered by:   Josph Macho CMA   Authorized by:   Minus Breeding MD   Signed by:   Josph Macho CMA on 08/21/2009   Method used:   Electronically to        [facility] (retail)       [address]       [place], [place]  [address]       Ph: [phone]       Fax: [phone]   RxID:   [id number]

## 2010-09-15 NOTE — Progress Notes (Signed)
Summary: alprazolam  Phone Note Refill Request Message from:  Pharmacy on April 30, 2010 11:46 AM  Refills Requested: Medication #1:  ALPRAZOLAM 0.25 MG  TABS 2 at bedtime as needed sleep   Dosage confirmed as above?Dosage Confirmed   Last Refilled: 03/27/2010   Notes: [facility] fax [phone] Initial call taken by: Brenton Grills MA,  April 30, 2010 11:47 AM  Follow-up for Phone Call        i printed Follow-up by: Minus Breeding MD,  April 30, 2010 12:09 PM  Additional Follow-up for Phone Call Additional follow up Details #1::        [facility]ed script to [facility] @ [phone] Additional Follow-up by: Orlan Leavens RMA,  April 30, 2010 1:40 PM    Prescriptions: ALPRAZOLAM 0.25 MG  TABS (ALPRAZOLAM) 2 at bedtime as needed sleep  #60 x 5   Entered and Authorized by:   Minus Breeding MD   Signed by:   Minus Breeding MD on 04/30/2010   Method used:   Print then Give to Patient   RxID:   [id number]

## 2010-09-15 NOTE — Assessment & Plan Note (Signed)
Summary: FOLLOW UP-LB   Vital Signs:  Patient profile:   50 year old male Height:      74 inches (187.96 cm) Weight:      365.38 pounds (166.08 kg) O2 Sat:      94 % on Room air Temp:     97.2 degrees F (36.22 degrees C) oral Pulse rate:   83 / minute BP sitting:   128 / 84  (left arm) Cuff size:   large  Vitals Entered By: Josph Macho CMA (September 17, 2009 9:30 AM)  O2 Flow:  Room air CC: Follow-up visit/ CF Is Patient Diabetic? Yes   Referring Provider:  pcp Primary Provider:  Minus Breeding MD  CC:  Follow-up visit/ CF.  History of Present Illness: no cbg record, but states cbg was low once when he took his am insulin but did not eat.  he says there is not much trend in his cbg throughout the day. he is concerned that 4-5 beers/day is affecting his health. he takes zocor as rx'ed.   Current Medications (verified): 1)  Diclofenac Sodium 75 Mg Tbec (Diclofenac Sodium) .... Two Times A Day As Needed Pain 2)  Metoprolol Tartrate 50 Mg Tabs (Metoprolol Tartrate) .... Take 1 By Mouth Two Times A Day  Need Physical Appt For Addtional Refills 3)  Simvastatin 80 Mg Tabs (Simvastatin) .... Take 1 By Mouth Qd 4)  Hyzaar 100-25 Mg  Tabs (Losartan Potassium-Hctz) .... Take 1 By Mouth Qd 5)  Alprazolam 0.25 Mg  Tabs (Alprazolam) .... 2 At Bedtime As Needed Sleep 6)  Prilosec Otc 20 Mg Tbec (Omeprazole Magnesium) .... Take Prn 7)  Humalog Mix 50/50 Kwikpen 50-50 % Susp (Insulin Lispro Prot & Lispro) .Marland Kitchen.. 120 Units Bid 8)  Levothyroxine Sodium 150 Mcg Tabs (Levothyroxine Sodium) .Marland Kitchen.. 1 Qd 9)  Bd Pen Needle Short U/f 31g X 8 Mm Misc (Insulin Pen Needle) .... Use As Directed  Allergies (verified): No Known Drug Allergies  Past History:  Past Medical History: Last updated: 08/01/2008 COUGH (ICD-786.2) RASH AND OTHER NONSPECIFIC SKIN ERUPTION (ICD-782.1) HYPOTHYROIDISM (ICD-244.9) OSTEOARTHRITIS (ICD-715.90) HYPERTENSION (ICD-401.9) HYPERLIPIDEMIA (ICD-272.4) DIABETES  MELLITUS, TYPE II (ICD-250.00) ANXIETY (ICD-300.00)  Review of Systems  The patient denies syncope.         denies hypoglycemia  Physical Exam  General:  obese.  no distress  Psych:  Alert and cooperative; normal mood and affect; normal attention span and concentration.   Additional Exam:  Hemoglobin A1C       [H]  10.1 %                      4.6-6.5 Cholesterol               134 mg/dL                   0-272 Triglycerides        [H]  447.0 mg/dL          Impression & Recommendations:  Problem # 1:  DIABETES MELLITUS, TYPE II (ICD-250.00) needs increased rx  Problem # 2:  HYPERLIPIDEMIA (ICD-272.4) needs increaed rx for tg's  Problem # 3:  ALCOHOL USE (ICD-305.00)  Medications Added to Medication List This Visit: 1)  Bd Pen Needle Short U/f 31g X 8 Mm Misc (Insulin pen needle) .... 2/day 2)  Levemir Flexpen 100 Unit/ml Soln (Insulin detemir) .... 225 units each am 3)  Simvastatin 80 Mg Tabs (Simvastatin) .... 1/2 tab qhs  4)  Fenofibrate Micronized 67 Mg Caps (Fenofibrate micronized) .Marland Kitchen.. 1 qd  Other Orders: Est. Patient Level IV ([address])  Patient Instructions: 1)  same medications for blood pressure. 2)  change insulin to levemir 225 units each am. 3)  Please schedule a follow-up appointment in 6 weeks. 4)  reduce simvastatin to 1/2 of 80 mg at bedtime. 5)  start fenofibrate 67 mg once daily. 6)  please reconsider weight-loss surgery.  i am happy to refer you to an informational meeting if you wish. 7)  minimize or discontinue alcohol intake Prescriptions: FENOFIBRATE MICRONIZED 67 MG CAPS (FENOFIBRATE MICRONIZED) 1 qd  #30 x 11   Entered and Authorized by:   Minus Breeding MD   Signed by:   Minus Breeding MD on 09/17/2009   Method used:   Electronically to        [facility] [phone]* (retail)       [address]       [place], [place]  [address]       Ph: [phone] or [phone]       Fax: [phone]   RxID:   [id number] SIMVASTATIN 80 MG TABS  (SIMVASTATIN) 1/2 tab qhs  #90 x 3   Entered and Authorized by:   Minus Breeding MD   Signed by:   Minus Breeding MD on 09/17/2009   Method used:   Electronically to        [facility] (retail)       [address]       [place], [place]  [address]       Ph: [phone]       Fax: [phone]   RxID:   [id number] BD PEN NEEDLE SHORT U/F 31G X 8 MM MISC (INSULIN PEN NEEDLE) 2/day  #60 x 11   Entered and Authorized by:   Minus Breeding MD   Signed by:   Minus Breeding MD on 09/17/2009   Method used:   Electronically to        [facility] [phone]* (retail)       [address]       [place], [place]  [address]       Ph: [phone] or [phone]       Fax: [phone]   RxID:   [id number] LEVEMIR FLEXPEN 100 UNIT/ML SOLN (INSULIN DETEMIR) 225 units each am  #5 boxes x 11   Entered and Authorized by:   Minus Breeding MD   Signed by:   Minus Breeding MD on 09/17/2009   Method used:   Electronically to        [facility] [phone]* (retail)       [address]       [place], [place]  [address]       Ph: [phone] or [phone]       Fax: [phone]   RxID:   [id number]

## 2010-09-15 NOTE — Progress Notes (Signed)
  Phone Note Call from Patient Call back at Home Phone [phone]   Caller: Patient Call For: Minus Breeding MD Summary of Call: pt requesing labs a1c,cholesterol and whatever labs needed prior to 9/2 appt. Please let pt know if he can do labs.  Initial call taken by: Verdell Face,  April 14, 2010 4:57 PM  Follow-up for Phone Call        pt notified. pt will come in for labs on 04/16/10 before appointment Follow-up by: Brenton Grills MA,  April 15, 2010 8:34 AM

## 2010-09-15 NOTE — Progress Notes (Signed)
Summary: Rx question  Phone Note From Pharmacy   Summary of Call: [facility] sent fax regarding pts Fenofibrate. They can no longer get 67 mg capsule in. They want to know if they can change rx to 54 mg. Initial call taken by: Brenton Grills MA,  April 16, 2010 12:02 PM  Follow-up for Phone Call        ok, x 1 only, as ov is overdue Follow-up by: Minus Breeding MD,  April 16, 2010 1:04 PM    New/Updated Medications: FENOFIBRATE 54 MG TABS (FENOFIBRATE) 1 once daily Prescriptions: FENOFIBRATE 54 MG TABS (FENOFIBRATE) 1 once daily  #30 x 1   Entered by:   Brenton Grills MA   Authorized by:   Minus Breeding MD   Signed by:   Brenton Grills MA on 04/16/2010   Method used:   Electronically to        [facility] [phone]* (retail)       [address]       [place], [place]  [address]       Ph: [phone] or [phone]       Fax: [phone]   RxID:   [id number]

## 2010-09-15 NOTE — Assessment & Plan Note (Signed)
Summary: FOLLOW UP FOR  PER MD-LB   Vital Signs:  Patient profile:   49 year old male Height:      74 inches (187.96 cm) Weight:      338.25 pounds (153.75 kg) BMI:     43.59 O2 Sat:      97 % Temp:     98.2 degrees F (36.78 degrees C) Pulse rate:   65 / minute Pulse rhythm:   regular BP sitting:   128 / 86  (left arm)  Vitals Entered By: Jarome Lamas (April 17, 2010 10:27 AM) CC: follow-up visit/pt is no longer taking doxycycline, Anusol, or Benicar/aj Is Patient Diabetic? Yes   Referring Provider:  pcp Primary Provider:  Minus Breeding MD  CC:  follow-up visit/pt is no longer taking doxycycline, Anusol, and or Benicar/aj.  History of Present Illness: htn:  he has been off the benicar x 3 mos. he has lost weight, due to his efforts.   no alcohol x 6 days--says this is a good trend for him.  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  no cbg record, but states cbg's are consistently in the 200's. he has few days of slight pain at the mtp of the right great toe, but no assoc numbness.   Current Medications (verified): 1)  Diclofenac Sodium 75 Mg Tbec (Diclofenac Sodium) .... Two Times A Day As Needed Pain 2)  Metoprolol Tartrate 50 Mg Tabs (Metoprolol Tartrate) .... Take 1 By Mouth Two Times A Day  Need Physical Appt For Addtional Refills 3)  Benicar Hct 40-25 Mg Tabs (Olmesartan Medoxomil-Hctz) .Marland Kitchen.. 1 By Mouth Once Daily 4)  Alprazolam 0.25 Mg  Tabs (Alprazolam) .... 2 At Bedtime As Needed Sleep 5)  Prilosec Otc 20 Mg Tbec (Omeprazole Magnesium) .... Take Prn 6)  Levothyroxine Sodium 150 Mcg Tabs (Levothyroxine Sodium) .Marland Kitchen.. 1 Qd 7)  Bd Pen Needle Short U/f 31g X 8 Mm Misc (Insulin Pen Needle) .... 2/day 8)  Levemir Flexpen 100 Unit/ml Soln (Insulin Detemir) .... 280 Units Each Am 9)  Simvastatin 80 Mg Tabs (Simvastatin) .... 1/2 Tab Qhs 10)  Fenofibrate Micronized 67 Mg Caps (Fenofibrate Micronized) .Marland Kitchen.. 1 Qd 11)  Doxycycline  Hyclate 100 Mg Caps (Doxycycline Hyclate) .Marland Kitchen.. 1 Two Times A Day 12)  Anusol-Hc 2.5 % Crea (Hydrocortisone) .... Three Times A Day As Needed For Hemorrhoids 13)  Onetouch Ultra Test  Strp (Glucose Blood) .... Two Times A Day, and Lancets 250.01 14)  Fenofibrate 54 Mg Tabs (Fenofibrate) .Marland Kitchen.. 1 Once Daily  Allergies (verified): No Known Drug Allergies  Past History:  Past Medical History: Last updated: 08/01/2008 COUGH (ICD-786.2) RASH AND OTHER NONSPECIFIC SKIN ERUPTION (ICD-782.1) HYPOTHYROIDISM (ICD-244.9) OSTEOARTHRITIS (ICD-715.90) HYPERTENSION (ICD-401.9) HYPERLIPIDEMIA (ICD-272.4) DIABETES MELLITUS, TYPE II (ICD-250.00) ANXIETY (ICD-300.00)  Review of Systems  The patient denies fever and dyspnea on exertion.    Physical Exam  General:  obese.  no distress  Pulses:  dorsalis pedis intact bilat.  Extremities:  no deformity.  no ulcer on the feet.  feet are of normal color and temp.  no edema right great toe mtp:  slight erythema/warmth/tenderness  Neurologic:  sensation is intact to touch on the feet.     Impression & Recommendations:  Problem # 1:  DIABETES MELLITUS, TYPE II (ICD-250.00) needs increased rx  Problem # 2:  GOUT (ICD-274.9) Assessment: Improved is prob the cause of toe pain  Problem # 3:  ALCOHOL USE (ICD-305.00)  Assessment: Improved  Problem # 4:  HYPERTENSION (ICD-401.9) improved prob due to improvement in #3  Other Orders: EKG w/ Interpretation (93000) Est. Patient Level IV (11914)  Patient Instructions: 1)  stay-off the benicar for now. 2)  continue your abstinence from alcohol. 3)  good diet and exercise habits significanly improve the control of your diabetes.  please let me know if you wish to be referred to a dietician.  high blood sugar is very risky to your health.  you should see an eye doctor every year. 4)  controlling your blood pressure and cholesterol drastically reduces the damage diabetes does to your body.  this also  applies to quitting smoking.  please discuss these with your doctor.  you should take an aspirin every day, unless you have been advised by a doctor not to.   5)  for now, just take levemir 280 units each am.   6)  continue the same fenofibrate for now also.  abstinence from alcohol and improvement in your diabetes will improve the triglycerides 7)  Please schedule a follow-up appointment in 2 weeks.

## 2010-09-15 NOTE — Assessment & Plan Note (Signed)
Summary: SLEEP APNEA///kp   Visit Type:  Initial Consult Copy to:  pcp Primary Provider/Referring Provider:  Minus Breeding MD  CC:  Pt here for sleep consult. Pt states need new mask for CPAP machine . Pt has used [facility] in the past..  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 He was diagnosed with obstructive sleep apnea 50 yrs ago based on overnight PSG where he could not sleep due to multiple arousals & placed on a nasal CPAP. he has done well sinc ethen except for  awt gain of about 20 lbs & some sonnolence after lunch & when inactive. Epworth Sleepiness Score is 12/24.He drinks a six-pack almost everyday from 5.30 to dinner time -8.30p. Bedtime is 10p, sleep latency is minimal, 2-3 awakenings for BR visits, gets up at 6 30 A feeling tired with dryness of mouth. He sometimes wakes up at 2 A & then needs to take 0.25 mg of xanax for insomnia. There is no history suggestive of cataplexy, sleep paralysis or parasomnias  reports compliance with CPAP, wife has not noted snoring, leak + occasional  Preventive Screening-Counseling & Management  Alcohol-Tobacco     Alcohol drinks/day: >5     Alcohol type: beer   History of Present Illness: Sleep Apnea  What time do you typically go to bed?(between what hours): 10pm  How long does it take you to fall asleep? 2 min  How many times during the night do you wake up? 3 x  What time do you get out of bed to start your day? 6:30  Do you drive or operate heavy machinery in your occupation? yes  How much has your weight changed (up or down) over the past two years? (in pounds): up 20 lbs  Have you ever had a sleep study before?  If yes,when and where: not completed  Do you currently use CPAP ? If so , at what pressure? yes ?  Do you wear oxygen at any time? If yes, how many liters per minute? no Current Medications (verified): 1)  Diclofenac Sodium 75 Mg Tbec (Diclofenac Sodium) ....  Two Times A Day As Needed Pain 2)  Metoprolol Tartrate 50 Mg Tabs (Metoprolol Tartrate) .... Take 1 By Mouth Two Times A Day  Need Physical Appt For Addtional Refills 3)  Simvastatin 80 Mg Tabs (Simvastatin) .... Take 1 By Mouth Qd 4)  Hyzaar 100-25 Mg  Tabs (Losartan Potassium-Hctz) .... Take 1 By Mouth Qd 5)  Alprazolam 0.25 Mg  Tabs (Alprazolam) .... 2 At Bedtime As Needed Sleep 6)  Prilosec Otc 20 Mg Tbec (Omeprazole Magnesium) .... Take Prn 7)  Humalog Mix 50/50 Kwikpen 50-50 % Susp (Insulin Lispro Prot & Lispro) .Marland Kitchen.. 120 Units Bid 8)  Levothyroxine Sodium 150 Mcg Tabs (Levothyroxine Sodium) .Marland Kitchen.. 1 Qd 9)  Bd Pen Needle Short U/f 31g X 8 Mm Misc (Insulin Pen Needle) .... Use As Directed  Allergies (verified): No Known Drug Allergies  Past History:  Past Medical History: Last updated: 08/01/2008 COUGH (ICD-786.2) RASH AND OTHER NONSPECIFIC SKIN ERUPTION (ICD-782.1) HYPOTHYROIDISM (ICD-244.9) OSTEOARTHRITIS (ICD-715.90) HYPERTENSION (ICD-401.9) HYPERLIPIDEMIA (ICD-272.4) DIABETES MELLITUS, TYPE II (ICD-250.00) ANXIETY (ICD-300.00)  Past Surgical History: Last updated: 08/23/2008 Denies surgical history  Family History: Last updated: 02/03/2009 mother had breast cancer  Social History: Last updated: 08/23/2008 married works Hospital doctor at [employer]  Never Smoked Alcohol use-no Drug use-no Regular exercise-no  Social History: Alcohol drinks/day:  >5  Review of Systems  The patient complains of shortness of breath with activity, productive cough, and anxiety.  The patient denies shortness of breath at rest, non-productive cough, coughing up blood, chest pain, irregular heartbeats, acid heartburn, indigestion, loss of appetite, weight change, abdominal pain, difficulty swallowing, sore throat, tooth/dental problems, headaches, nasal congestion/difficulty breathing through nose, sneezing, itching, ear ache, depression, hand/feet swelling, joint stiffness or pain, rash, change in  color of mucus, and fever.    Vital Signs:  Patient profile:   50 year old male Height:      74 inches Weight:      373.13 pounds O2 Sat:      95 % on Room air Temp:     98.1 degrees F oral Pulse rate:   92 / minute BP sitting:   158 / 86  (left arm) Cuff size:   regular  Vitals Entered By: Zackery Barefoot CMA (September 02, 2009 3:44 PM)  O2 Flow:  Room air CC: Pt here for sleep consult. Pt states need new mask for CPAP machine . Pt has used [facility] in the past. Comments Medications reviewed with patient Zackery Barefoot [facility]  September 02, 2009 3:44 PM    Physical Exam  Additional Exam:  wt 373 September 02, 2009  Gen. Pleasant,obese, in no distress, normal affect ENT - no lesions, no post nasal drip, class 3 airway Neck: No JVD, no thyromegaly, no carotid bruits Lungs: no use of accessory muscles, no dullness to percussion, clear without rales or rhonchi  Cardiovascular: Rhythm regular, heart sounds  normal, no murmurs or gallops, no peripheral edema Abdomen: soft and non-tender, no hepatosplenomegaly, BS normal. Musculoskeletal: No deformities, no cyanosis or clubbing Neuro:  alert, non focal     Impression & Recommendations:  Problem # 1:  SLEEP APNEA (ICD-780.57) The pathophysiology of obstructive sleep apnea, it's cardiovascular consequences and modes of treatment including CPAP were discussed with the patient in great detail.  Rx sent for supplies. Compliance encouraged, wt loss emphasized, asked to avoid meds with sedative side effects, cautioned against driving when sleepy.  If symptomatic excessive daytime somnolence , will obtain CPAP download & review. Avoid alcohol 3 hrs priro to bedtime. Orders: DME Referral (DME) Consultation Level III (04540)  Problem # 2:  COUGH (ICD-786.2) smoking cessation discussed  Patient Instructions: 1)  Copy sent to: Dr Everardo All 2)  Please schedule a follow-up appointment in 1 year. 3)  Rx sent for supplies 4)  Call us sooner if  increased somnolence, snoring noted    Appended Document: SLEEP APNEA///kp reviewed PSG 7/00 >> severe obstructive sleep apnea with RDI 125/h, wt 335 rpt 11/04, wt 350 >> severe obstructive sleep apnea RDI 54/h, corrected su optimally with CPAP 8 cm,  - could not tolerate due to nasal congestion

## 2010-09-15 NOTE — Progress Notes (Signed)
Summary: levothyroxine  Phone Note Refill Request Message from:  Fax from Pharmacy on January 19, 2010 9:42 AM  Refills Requested: Medication #1:  LEVOTHYROXINE SODIUM 150 MCG TABS 1 qd   Last Refilled: 01/18/2010  Method Requested: Electronic Initial call taken by: Orlan Leavens,  January 19, 2010 9:42 AM    Prescriptions: LEVOTHYROXINE SODIUM 150 MCG TABS (LEVOTHYROXINE SODIUM) 1 qd  #30 x 6   Entered by:   Orlan Leavens   Authorized by:   Minus Breeding MD   Signed by:   Orlan Leavens on 01/19/2010   Method used:   Electronically to        [facility] (retail)       [address]       [place], [place]  [address]       Ph: [phone]       Fax: [phone]   RxID:   [id number]

## 2010-09-15 NOTE — Progress Notes (Signed)
Summary: Alprazolam  Phone Note Refill Request Message from:  Fax from Pharmacy  Refills Requested: Medication #1:  ALPRAZOLAM 0.25 MG  TABS 2 at bedtime as needed sleep   Dosage confirmed as above?Dosage Confirmed   Last Refilled: 09/02/2009   Notes: 90 day supply requested [facility] [phone] Initial call taken by: Brenton Grills MA,  March 27, 2010 9:50 AM  Follow-up for Phone Call        refill x 30 days pending ov Follow-up by: Minus Breeding MD,  March 27, 2010 9:58 AM    Prescriptions: ALPRAZOLAM 0.25 MG  TABS (ALPRAZOLAM) 2 at bedtime as needed sleep  #60 x 0   Entered by:   Brenton Grills MA   Authorized by:   Minus Breeding MD   Signed by:   Brenton Grills MA on 03/27/2010   Method used:   Telephoned to ...       [facility] (retail)       [address]       [place], [place]  [address]       Ph: [phone]       Fax: [phone]   RxID:   [id number]

## 2010-09-17 NOTE — Assessment & Plan Note (Signed)
Summary: cpx-lb   Vital Signs:  Patient profile:   50 year old male Height:      74 inches (187.96 cm) Weight:      338.38 pounds (153.81 kg) BMI:     43.60 O2 Sat:      97 % on Room air Temp:     98.5 degrees F (36.94 degrees C) oral Pulse rate:   80 / minute Pulse rhythm:   regular BP sitting:   130 / 88  (left arm) Cuff size:   large  Vitals Entered By: Brenton Grills CMA Duncan Dull) (July 20, 2010 11:10 AM)  O2 Flow:  Room air CC: Physical/pt is no longer taking Doxycycline Hyclate/aj Is Patient Diabetic? Yes   Referring Provider:  pcp Primary Provider:  Minus Breeding MD  CC:  Physical/pt is no longer taking Doxycycline Hyclate/aj.  History of Present Illness: here for regular wellness examination.  He's feeling pretty well in general.  Current Medications (verified): 1)  Diclofenac Sodium 75 Mg Tbec (Diclofenac Sodium) .... Two Times A Day As Needed Pain 2)  Metoprolol Tartrate 50 Mg Tabs (Metoprolol Tartrate) .... Take 1 By Mouth Two Times A Day  Need Physical Appt For Addtional Refills 3)  Alprazolam 0.25 Mg  Tabs (Alprazolam) .... 2 At Bedtime As Needed Sleep 4)  Prilosec Otc 20 Mg Tbec (Omeprazole Magnesium) .... Take Prn 5)  Levothyroxine Sodium 150 Mcg Tabs (Levothyroxine Sodium) .Marland Kitchen.. 1 Qd 6)  Bd Pen Needle Short U/f 31g X 8 Mm Misc (Insulin Pen Needle) .... 2/day 7)  Levemir Flexpen 100 Unit/ml Soln (Insulin Detemir) .... 280 Units Each Am 8)  Simvastatin 80 Mg Tabs (Simvastatin) .... 1/2 Tab Qhs 9)  Onetouch Ultra Test  Strp (Glucose Blood) .... Two Times A Day, and Lancets 250.01 10)  Fenofibrate 54 Mg Tabs (Fenofibrate) .Marland Kitchen.. 1 Once Daily 11)  Doxycycline Hyclate 100 Mg Caps (Doxycycline Hyclate) .Marland Kitchen.. 1 Tab Two Times A Day  Allergies (verified): No Known Drug Allergies  Past History:  Past Medical History: COUGH (ICD-786.2) RASH AND OTHER NONSPECIFIC SKIN ERUPTION (ICD-782.1) HYPOTHYROIDISM (ICD-244.9) OSTEOARTHRITIS (ICD-715.90) HYPERTENSION  (ICD-401.9) HYPERLIPIDEMIA (ICD-272.4) DIABETES MELLITUS, TYPE II (ICD-250.00) ANXIETY (ICD-300.00)  pulm: dr Vassie Loll opthal: dr [place]  Family History: Reviewed history from 02/03/2009 and no changes required. mother had breast cancer  Social History: Reviewed history from 08/23/2008 and no changes required. married works Hospital doctor at [employer] Alcohol use-pt says he needs to  minimize. Drug use-no Regular exercise-no  Review of Systems  The patient denies fever, weight loss, weight gain, vision loss, decreased hearing, chest pain, syncope, dyspnea on exertion, prolonged cough, headaches, abdominal pain, melena, hematochezia, severe indigestion/heartburn, hematuria, suspicious skin lesions, and depression.    Physical Exam  General:  morbidly obese.  no distress  Head:  head: no deformity eyes: no periorbital swelling, no proptosis external nose and ears are normal mouth: no lesion seen Ears:  TM's intact and clear with normal canals (slight cerumen at the left tm), with grossly normal hearing.   Neck:  Supple without thyroid enlargement or tenderness.  Lungs:  Clear to auscultation bilaterally. Normal respiratory effort.  Heart:  Regular rate and rhythm without murmurs or gallops noted. Normal S1,S2.   Abdomen:  abdomen is soft, nontender.  no hepatosplenomegaly.   not distended.  there is a self-reducing ventral hernia.  Rectal:  normal external and internal exam.  heme neg  Prostate:  Normal size prostate without masses or tenderness.  Msk:  muscle bulk  and strength are grossly normal.  no obvious joint swelling.  gait is normal and steady  Neurologic:  cn 2-12 grossly intact.   readily moves all 4's.   sensation is intact to touch on the feet  Skin:  normal texture and temp.  no rash.  not diaphoretic multiple skin tags Cervical Nodes:  No significant adenopathy.  Psych:  Alert and cooperative; normal mood and affect; normal attention span and concentration.     Additional Exam:  SEPARATE EVALUATION FOLLOWS--EACH PROBLEM HERE IS NEW, NOT RESPONDING TO TREATMENT, OR POSES SIGNIFICANT RISK TO THE PATIENT'S HEALTH: HISTORY OF THE PRESENT ILLNESS: no cbg record, but states cbg's vary 180-320. pt takes and tolerates zocor well. PAST MEDICAL HISTORY reviewed and up to date today REVIEW OF SYSTEMS: denies hypoglycemia PHYSICAL EXAMINATION: no deformity.  no ulcer on the feet.  feet are of normal color and temp.  no edema sensation is intact to touch on the feet see vs page LABS: a1c and lipids are noted IMPRESSION: dm, needs increased rx PLAN: see instruction sheet.     Impression & Recommendations:  Problem # 1:  ROUTINE GENERAL MEDICAL EXAM@HEALTH  CARE FACL (ICD-V70.0)  Medications Added to Medication List This Visit: 1)  Metoprolol Tartrate 50 Mg Tabs (Metoprolol tartrate) .... Take 1 by mouth two times a day 2)  Levothyroxine Sodium 150 Mcg Tabs (Levothyroxine sodium) .Marland Kitchen.. 1 tab once daily 3)  Levemir Flexpen 100 Unit/ml Soln (Insulin detemir) .... 325 units each am 4)  Simvastatin 40 Mg Tabs (Simvastatin) .... 1/2 tab at bedtime 5)  Fenofibrate Micronized 134 Mg Caps (Fenofibrate micronized) .Marland Kitchen.. 1 tab once daily  Other Orders: Est. Patient Level III (59563) Est. Patient 40-64 years (87564)  Patient Instructions: 1)  go to lab in 1 month for a1c 250.01, and chest x-ray 518.89. 2)  reduce simvastatin to 1/2 of 40 mg once daily. 3)  increase fenofibrate to 134 mg once daily. 4)  please consider these measures for your health:  minimize alcohol.  do not use tobacco products.  have a colonoscopy at least every 10 years from age 29.  keep firearms safely stored.  always use seat belts.  have working smoke alarms in your home.  see an eye doctor and dentist regularly.  never drive under the influence of alcohol or drugs (including prescription drugs).  those with fair skin should take precautions against the sun. 5)  please let me know what  your wishes would be, if artificial life support measures should become necessary.  it is critically important to prevent falling down (keep floor areas well-lit, dry, and free of loose objects). 6)  increase levemir to 325 units each am. 7)  check your blood sugar 2 times a day.  vary the time of day when you check, between before the 3 meals, and at bedtime.  also check if you have symptoms of your blood sugar being too high or too low.  please keep a record of the readings and bring it to your next appointment here.  please call us sooner if you are having low blood sugar episodes.   Prescriptions: ONETOUCH ULTRA TEST  STRP (GLUCOSE BLOOD) two times a day, and lancets 250.01  #100 x 11   Entered and Authorized by:   Minus Breeding MD   Signed by:   Minus Breeding MD on 07/20/2010   Method used:   Electronically to        [facility] (retail)  [address]       [place], [place]  [address]       Ph: [phone] or [phone]       Fax: [phone]   RxID:   [id number] BD PEN NEEDLE SHORT U/F 31G X 8 MM MISC (INSULIN PEN NEEDLE) 2/day  #60 x 11   Entered and Authorized by:   Minus Breeding MD   Signed by:   Minus Breeding MD on 07/20/2010   Method used:   Electronically to        [facility] (retail)       [address]       [place], [place]  [address]       Ph: [phone] or [phone]       Fax: [phone]   RxID:   [id number] LEVOTHYROXINE SODIUM 150 MCG TABS (LEVOTHYROXINE SODIUM) 1 tab once daily  #30 x 11   Entered and Authorized by:   Minus Breeding MD   Signed by:   Minus Breeding MD on 07/20/2010   Method used:   Electronically to        [facility] (retail)       [address]       [place], [place]  [address]       Ph: [phone] or [phone]       Fax: [phone]   RxID:   [id number] METOPROLOL TARTRATE 50 MG TABS (METOPROLOL TARTRATE) take 1 by mouth two times a day  #60 x 11   Entered and Authorized by:   Minus Breeding  MD   Signed by:   Minus Breeding MD on 07/20/2010   Method used:   Electronically to        [facility] (retail)       [address]       [place], [place]  [address]       Ph: [phone] or [phone]       Fax: [phone]   RxID:   [id number] DICLOFENAC SODIUM 75 MG TBEC (DICLOFENAC SODIUM) two times a day as needed pain  #60 x 11   Entered and Authorized by:   Minus Breeding MD   Signed by:   Minus Breeding MD on 07/20/2010   Method used:   Electronically to        [facility] (retail)       [address]       [place], [place]  [address]       Ph: [phone] or [phone]       Fax: [phone]   RxID:   [id number] LEVEMIR FLEXPEN 100 UNIT/ML SOLN (INSULIN DETEMIR) 325 units each am  #8 boxes x 1   Entered and Authorized by:   Minus Breeding MD   Signed by:   Minus Breeding MD on 07/20/2010   Method used:   Electronically to        [facility] (retail)       [address]       [place], [place]  [address]       Ph: [phone] or [phone]       Fax: [phone]   RxID:   [id number] FENOFIBRATE MICRONIZED 134 MG CAPS (FENOFIBRATE MICRONIZED) 1 tab once daily  #30 x 11   Entered and Authorized by:   Minus Breeding MD   Signed by:   Minus Breeding MD on 07/20/2010  Method used:   Electronically to        [facility] (retail)       [address]       [place], [place]  [address]       Ph: [phone] or [phone]       Fax: [phone]   RxID:   [id number] SIMVASTATIN 40 MG TABS (SIMVASTATIN) 1/2 tab at bedtime  #30 x 5   Entered and Authorized by:   Minus Breeding MD   Signed by:   Minus Breeding MD on 07/20/2010   Method used:   Electronically to        [facility] (retail)       [address]       [place], [place]  [address]       Ph: [phone] or [phone]       Fax: [phone]   RxID:   [id number]    Orders Added: 1)  Est. Patient Level III [57322] 2)  Est. Patient 40-64 years [phone]

## 2010-09-17 NOTE — Letter (Signed)
Summary: 2010 [facility]  2010 [facility]   Imported By: Lester [place] 08/04/2010 10:12:03  _____________________________________________________________________  External Attachment:    Type:   Image     Comment:   External Document

## 2010-09-30 ENCOUNTER — Telehealth: Payer: Self-pay | Admitting: Endocrinology

## 2010-10-07 NOTE — Progress Notes (Signed)
Summary: Alprazolam  Phone Note Refill Request Message from:  Patient  Refills Requested: Medication #1:  ALPRAZOLAM 0.25 MG  TABS 2 at bedtime as needed sleep [facility] - pt req a call when complete, he is out of med today  Initial call taken by: Lamar Sprinkles, CMA,  September 30, 2010 2:05 PM  Follow-up for Phone Call        i printed Follow-up by: Minus Breeding MD,  October 01, 2010 7:52 AM  Additional Follow-up for Phone Call Additional follow up Details #1::        Rx faxed to pharmacy Additional Follow-up by: Margaret Pyle, CMA,  October 01, 2010 8:18 AM    Prescriptions: ALPRAZOLAM 0.25 MG  TABS (ALPRAZOLAM) 2 at bedtime as needed sleep  #60 x 1   Entered and Authorized by:   Minus Breeding MD   Signed by:   Minus Breeding MD on 10/01/2010   Method used:   Print then Give to Patient   RxID:   [id number]

## 2010-11-24 ENCOUNTER — Other Ambulatory Visit: Payer: Self-pay | Admitting: Endocrinology

## 2010-11-25 NOTE — Telephone Encounter (Signed)
Rx telephoned in to pharmacy, error when sent electronically.

## 2010-12-29 NOTE — Assessment & Plan Note (Signed)
[facility]                                 ON-CALL NOTE   DAKODA, LAVENTURE                      MRN:          [id number]  DATE:03/31/2007                            DOB:          09-21-1960    Telephone # [phone] at 7:16 p.m. on March 31, 2007.   He was originally calling about some medication that was called in but  when I called him back he states he called the pharmacy back and his  medicine was there so he no longer needed [place].     Lelon Perla, DO  Electronically Signed    Shawnie Dapper  DD: 03/31/2007  DT: 04/01/2007  Job #: [id number]   cc:   Gregary Signs A. Everardo All, MD

## 2010-12-31 ENCOUNTER — Other Ambulatory Visit: Payer: Self-pay | Admitting: *Deleted

## 2010-12-31 NOTE — Telephone Encounter (Signed)
R'cd fax from [facility] for refill of pt's Alprazolam  Last Filled-12/04/2010

## 2011-01-01 ENCOUNTER — Other Ambulatory Visit: Payer: Self-pay | Admitting: Endocrinology

## 2011-01-01 ENCOUNTER — Encounter: Payer: Self-pay | Admitting: *Deleted

## 2011-01-01 MED ORDER — ALPRAZOLAM 0.25 MG PO TABS
0.2500 mg | ORAL_TABLET | Freq: Every evening | ORAL | Status: DC | PRN
Start: 2011-01-01 — End: 2011-02-05

## 2011-01-01 NOTE — Telephone Encounter (Signed)
i printed 

## 2011-01-01 NOTE — Op Note (Signed)
NAME:  Johnny Andrews, Johnny Andrews                       ACCOUNT NO.:  [id number]   MEDICAL RECORD NO.:  [id number]                   PATIENT TYPE:  OIB   LOCATION:  2550                                 FACILITY:  [facility]   PHYSICIAN:  Suzanna Obey, M.D.                    DATE OF BIRTH:  1961/08/05   DATE OF PROCEDURE:  08/29/2003  DATE OF DISCHARGE:                                 OPERATIVE REPORT   PREOPERATIVE DIAGNOSIS:  Deviated septum and turbinate hypertrophy with  obstructive sleep apnea.   POSTOPERATIVE DIAGNOSIS:  Deviated septum and turbinate hypertrophy with  obstructive sleep apnea.   PROCEDURE:  Septoplasty and submucous resection of inferior turbinates.   ANESTHESIA:  General endotracheal tube.   ESTIMATED BLOOD LOSS:  Less than 25 mL.   INDICATIONS FOR PROCEDURE:  This is a 50 year old who has had significant  obstructive sleep apnea and is not able to tolerate the CPAP.  He has sleep  apnea that is significant enough to where the palatal surgery probably would  not benefit him profoundly and as long as he is able to tolerate the CPAP,  that would be a better mode of treatment.  His nose is a problem with a  deviation of the septum to the right and turbinate hypertrophy.  He was  informed of the risks and benefits of the procedure including bleeding,  infection, septal perforation, change in the external appearance of the  nose, chronic crusting and drying, numbness of the teeth, and risks of the  anesthetic.  All questions were answered and consent was obtained.   DESCRIPTION OF PROCEDURE:  The patient was taken to the operating room and  placed in the supine position.  After adequate general endotracheal tube  anesthesia was prepped and draped in the usual sterile fashion.  1%  lidocaine with 1:100,000 epinephrine was injected into the septum and  inferior turbinates.  A right hemitransfixion incision was performed raising  the mucoperichondrial and osteal flap.  All of  the cartilage and bone seemed  to be still present even though he has had a previous septoplasty.  The  cartilage was divided about 2 cm posterior to the caudal strut and the  deviated portion of the cartilage was removed posteriorly.  The deviated  portion of the bone was removed with the Jansen-Middleton forceps.  The  inferior spur was removed with a 4 mm osteotome.  This corrected the septal  deflection.  The inferior turbinates were outfractured and a midline  incision made with a 15 blade and mucosal flap elevated superiorly.  The  left side had an obvious previous resection posteriorly, but there was still  a large piece posteriorly that was obstructing the choana and anterior the  turbinate was huge.  This anterior portion was trimmed and the posterior  aspect was cauterized to remove it completely to open up the back side of  the nose.  The right side was trimmed with the turbinate scissors.  The edge  was cauterized with suction cautery.  Flaps were laid back down over both  raw surfaces and the turbinates were outfractured.  The hemitransfixion  incision was closed with interrupted 4-0 chromic and a 4-0 plain gut  quilting stitch placed through the septum.  Oral cavity and oropharynx were  suctioned out of all blood and debris as well as the nasopharynx under  direct visualization.  This  was done with nasal endoscopy which was how the turbinate reduction was  performed.  The Telfa roll soaked in bacitracin was placed into the nose  bilaterally and secured with a 3-0 nylon.  The patient was awakened, brought  to the recovery room in stable condition.  Needle, sponge, and instrument  counts correct.                                               Suzanna Obey, M.D.    Cordelia Pen  D:  08/29/2003  T:  08/29/2003  Job:  [id number]   cc:   Shan Levans, M.D. [facility]   Sean A. Everardo All, M.D. [facility]

## 2011-01-01 NOTE — Telephone Encounter (Signed)
Please preload this med, and i'll refill x 1 Ov is due

## 2011-01-04 NOTE — Telephone Encounter (Signed)
Rx faxed to pharmacy, see previous refill req

## 2011-02-05 ENCOUNTER — Other Ambulatory Visit: Payer: Self-pay

## 2011-02-05 MED ORDER — ALPRAZOLAM 0.25 MG PO TABS: 0.5000 mg | ORAL_TABLET | Freq: Every evening | ORAL | Status: DC | PRN

## 2011-02-05 NOTE — Telephone Encounter (Signed)
Rx faxed to pharmacy  

## 2011-03-01 ENCOUNTER — Ambulatory Visit: Payer: Self-pay | Admitting: Endocrinology

## 2011-03-31 ENCOUNTER — Other Ambulatory Visit: Payer: Self-pay | Admitting: *Deleted

## 2011-03-31 MED ORDER — INSULIN DETEMIR 100 UNIT/ML ~~LOC~~ SOLN: SUBCUTANEOUS | Status: DC

## 2011-03-31 NOTE — Telephone Encounter (Signed)
R'cd fax from [facility] for refill of pt's Levemir   Last OV-012/12/2009  Last filled-02/23/2011

## 2011-04-11 ENCOUNTER — Other Ambulatory Visit: Payer: Self-pay | Admitting: Endocrinology

## 2011-04-15 ENCOUNTER — Other Ambulatory Visit: Payer: Self-pay

## 2011-04-15 MED ORDER — ALPRAZOLAM 0.25 MG PO TABS: 0.5000 mg | ORAL_TABLET | Freq: Every evening | ORAL | Status: DC | PRN

## 2011-04-15 NOTE — Telephone Encounter (Signed)
Rx faxed to [facility], pt has acute visit tomorrow, he will make an OV tomorrow.

## 2011-04-15 NOTE — Telephone Encounter (Signed)
i printed Ov is due 

## 2011-04-16 ENCOUNTER — Ambulatory Visit (INDEPENDENT_AMBULATORY_CARE_PROVIDER_SITE_OTHER): Payer: BC Managed Care – PPO | Admitting: Internal Medicine

## 2011-04-16 DIAGNOSIS — I10 Essential (primary) hypertension: Secondary | ICD-10-CM

## 2011-04-16 DIAGNOSIS — E119 Type 2 diabetes mellitus without complications: Secondary | ICD-10-CM

## 2011-04-16 DIAGNOSIS — R059 Cough, unspecified: Secondary | ICD-10-CM

## 2011-04-16 DIAGNOSIS — R05 Cough: Secondary | ICD-10-CM

## 2011-04-16 MED ORDER — AZITHROMYCIN 500 MG PO TABS: 500.0000 mg | ORAL_TABLET | Freq: Every day | ORAL | Status: DC

## 2011-04-16 NOTE — Assessment & Plan Note (Signed)
Subopitmal control.  NO change in medication. Needs life-style: exercise, weight loss

## 2011-04-16 NOTE — Progress Notes (Signed)
  Subjective:    Patient ID: Johnny Andrews, male    DOB: September 21, 1960, 50 y.o.   MRN: [id number]  HPI Johnny Andrews presents with a several day h/o cough and nasal drainage. No fever, no sinus pain. Scratchy throat. No SOB.  He is a poorly controlled diabetic. Talked at length about life-style management: exercise, diet.  I have reviewed the patient's medical history in detail and updated the computerized patient record.    Review of Systems System review is negative for any constitutional, cardiac, pulmonary, GI or neuro symptoms or complaints     Objective:   Physical Exam Vitals - noted and BP mildly elevated Gen'l- obese white male in no distress HEENT - TMs normal, throat clear, no sinus tenderness Chest - clear Cor - RRR       Assessment & Plan:

## 2011-04-16 NOTE — Patient Instructions (Signed)
URI - concern for viral infection vs allergic type symptoms. No evidence today of a bacterial infection. Plan - robitussin DM equivalent ( dextromethorophan, guafenesin) as directed; vitamin C 1500mg  daily, hydrate, claritin 10 mg once a day ( antihistamine), Tri-pak if needed for bacterial symptoms = fever, bitter metallic snot or mucus, facial pain.  Diabetes - see Dr. Everardo All for medication adjustment. REALLY work on diet: NO sugar, low carbs. You need to read labels! You need to watch the portion size when you eat. You need to exercise every day - a goal of 30 minutes of exercise that gets your heart rate to 130.   Blood pressure - running a little high. The life-style management will dramatically help with blood pressure too.   Upper Respiratory Infection (URI), Adult You have an upper respiratory infection (URI). This is also known as the common cold. The upper respiratory tract includes the nose, sinuses, throat, trachea, and bronchi (airways leading to the lungs).   CAUSE Many different viruses can infect the tissues lining the upper respiratory tract. The tissues become irritated and inflamed and often become very moist. A cold is contagious. You can easily spread the virus to others by oral contact. This includes kissing, sharing a glass, and coughing or sneezing. It can also be spread by touching your mouth or nose and then touching a surface which is then touched by another person. Symptoms typically develop one to three days after you come in contact with a cold virus. SYMPTOMS Symptoms vary from person to person. They may include runny nose, sneezing, nasal congestion, sinus irritation, sore throat, laryngitis, or cough. Fatigue, muscle aches, headache, and low grade fever may also occur. DIAGNOSIS You may diagnose your own upper respiratory infection based on familiar symptoms, since most people get a cold 2-3 times a year. Your caregiver can confirm this based on your examination. Most  importantly, your caregiver can check that your symptoms are not due to another disease such as strep throat, sinusitis, pneumonia, asthma, epiglottis, or others. Blood tests, throat tests, and x-rays are not necessary to diagnose an upper respiratory infection.   PREVENTION The best way to protect against getting a cold is to practice good hygiene. Avoid oral or hand contact with people with cold symptoms. Wash hands often if contact occurs. There is no clear evidence that vitamin C, vitamin E, Echinacea, or exercise reduces the chance of developing an upper respiratory infection. PROGNOSIS An upper respiratory infection is benign. Most people improve within a week, but symptoms can last two weeks. A residual cough may last a week longer. Complications can develop. RISKS AND COMPLICATIONS You may be at risk for a more severe case of the common cold if you smoke cigarettes, have chronic heart or lung disease, or if you have a weakened immune system. Bacterial sinusitis, middle ear infections, and bacterial pneumonia can complicate the common cold. The common cold can worsen asthma and COPD (chronic obstructive pulmonary disease). Sometimes these complications can be life threatening. TREATMENT Treatment is directed at relieving symptoms. There is no cure. Antibiotics are not effective. In fact, improper use of antibiotics can lead to the development of drug resistant bacteria. Non-medical treatment is safe and may be helpful:  Increased fluid intake.   Inhale heated mist or steam (vaporizer or shower).   Sip chicken soup.   Get plenty of rest.   Use gargles or lozenges for comfort.  Zinc gel and zinc lozenges, taken in the first 24 hours of the common  cold, can shorten the duration and lessen the severity of symptoms. Pain medications may help fever, muscle aches, and throat pain. A variety of non-prescription medications are available to treat congestion and runny nose. Your caregiver can make  recommendations and may suggest nasal or lung inhalers for other symptoms. Once again, antibiotics are not effective for upper respiratory infections.   HOME CARE INSTRUCTIONS  Only take over-the-counter or prescription medicines for pain, discomfort, or fever as directed by your caregiver.   Use a warm mist humidifier or inhale steam from a shower to increase air moisture. This may keep secretions moist and make it easier to breathe.   Drink plenty of clear liquids. You are drinking enough fluids if the urine remains a light yellow color rather than a dark color.   Rest as needed.   Return to work when your temperature has returned to normal or as your caregiver advises. You may need to stay home longer to avoid infecting others. You can also use a face mask and careful hand washing to prevent spread of virus.  SEEK MEDICAL CARE IF:  After the first few days, you feel you are getting worse rather than better.   You need your caregiver's advice about medications to control symptoms.   You develop symptoms that you think suggest complications such as pneumonia, sinusitis, or strep throat.  SEEK IMMEDIATE MEDICAL CARE IF:  You develop an oral temperature above 100 or if the fever lasts more than 2 days.   You develop severe or persistent headache, ear pain, sinus pain, or chest pain.   You develop a prolonged cough, cough up blood, have a change in your usual mucus (if you have chronic lung disease), or develop wheezing.   You develop sore muscles, stiff neck, or severe headache not controlled with medications.  Document Released: 01/26/2001 Document Re-Released: 05/11/2008 [facility] Patient Information 2011 [place], [place].

## 2011-04-16 NOTE — Assessment & Plan Note (Signed)
No evidence of  Bacterial infection.   Plan - supportive care, see pt instructions           Tri-pak if symptoms get worse           Robitussin DM or equivalent, Allegra, etc.

## 2011-04-16 NOTE — Assessment & Plan Note (Signed)
Poor control - discussed life-style management. He will continue present meds and see Dr. Everardo All. He is encourged to improve his diet and to exercise every day.

## 2011-06-10 ENCOUNTER — Other Ambulatory Visit: Payer: Self-pay

## 2011-06-10 MED ORDER — ALPRAZOLAM 0.25 MG PO TABS: 0.5000 mg | ORAL_TABLET | Freq: Every evening | ORAL | Status: DC | PRN

## 2011-06-10 NOTE — Telephone Encounter (Signed)
Rx faxed to [facility], pt states he will callback to make an appointment.

## 2011-06-10 NOTE — Telephone Encounter (Signed)
i refilled F/u ov is due

## 2011-07-26 ENCOUNTER — Other Ambulatory Visit: Payer: Self-pay | Admitting: Endocrinology

## 2011-07-29 ENCOUNTER — Other Ambulatory Visit: Payer: Self-pay | Admitting: Endocrinology

## 2011-08-05 ENCOUNTER — Other Ambulatory Visit: Payer: Self-pay | Admitting: Endocrinology

## 2011-08-12 ENCOUNTER — Other Ambulatory Visit: Payer: Self-pay | Admitting: *Deleted

## 2011-08-12 MED ORDER — ALPRAZOLAM 0.25 MG PO TABS: 0.5000 mg | ORAL_TABLET | Freq: Every evening | ORAL | Status: DC | PRN

## 2011-08-12 NOTE — Telephone Encounter (Signed)
Faxed hardcopy to pharmacy. 

## 2011-08-12 NOTE — Telephone Encounter (Signed)
Done hardcopy to robin  

## 2011-08-12 NOTE — Telephone Encounter (Signed)
R'cd fax from [facility] for refill of Alprazolam-last written 06/10/2011 #60 with 1 refill-please advise in SAE's absence-thanks

## 2011-09-12 ENCOUNTER — Other Ambulatory Visit: Payer: Self-pay | Admitting: Endocrinology

## 2011-10-08 ENCOUNTER — Other Ambulatory Visit: Payer: Self-pay

## 2011-10-08 MED ORDER — ALPRAZOLAM 0.25 MG PO TABS: 0.5000 mg | ORAL_TABLET | Freq: Every evening | ORAL | Status: DC | PRN

## 2011-10-08 NOTE — Telephone Encounter (Signed)
i refilled x 1 Ov is due 

## 2011-10-11 NOTE — Telephone Encounter (Signed)
Faxed hardcopy to pharmacy. 

## 2011-10-19 ENCOUNTER — Other Ambulatory Visit: Payer: Self-pay | Admitting: Endocrinology

## 2011-10-27 ENCOUNTER — Other Ambulatory Visit: Payer: Self-pay | Admitting: Endocrinology

## 2011-11-06 ENCOUNTER — Other Ambulatory Visit: Payer: Self-pay | Admitting: Endocrinology

## 2011-11-08 ENCOUNTER — Other Ambulatory Visit: Payer: Self-pay | Admitting: *Deleted

## 2011-11-08 MED ORDER — ALPRAZOLAM 0.25 MG PO TABS: 0.5000 mg | ORAL_TABLET | Freq: Every evening | ORAL | Status: DC | PRN

## 2011-11-08 NOTE — Telephone Encounter (Signed)
i printed refill x 1 No further refill until ov

## 2011-11-08 NOTE — Telephone Encounter (Signed)
Rx faxed to [facility]. Informed pt to make OV for additional refills. Pt states that he will callback to make regular physical appointment.

## 2011-11-08 NOTE — Telephone Encounter (Signed)
R'cd fax from [facility] for refill of Alprazolam-last written 10/08/2011 #60 with 0 refills-please advise.

## 2011-11-09 ENCOUNTER — Telehealth: Payer: Self-pay | Admitting: Endocrinology

## 2011-11-09 NOTE — Telephone Encounter (Signed)
The pt made his cpe appt and is hoping to get his xanax refilled.  I told the patient it looked like it was sent yesterday (according to the med profile) but the patient stated [facility] afternoon and they did not have the rx.  Thanks!

## 2011-11-09 NOTE — Telephone Encounter (Signed)
Called pharmacy, rx not received. Rx called into pharmacy. Pt informed.

## 2011-11-13 ENCOUNTER — Encounter (HOSPITAL_COMMUNITY): Payer: Self-pay | Admitting: *Deleted

## 2011-11-13 ENCOUNTER — Emergency Department (HOSPITAL_COMMUNITY)
Admission: EM | Admit: 2011-11-13 | Discharge: 2011-11-13 | Disposition: A | Discharge status: Home / Self Care | Payer: BC Managed Care – PPO | Attending: Emergency Medicine | Admitting: Emergency Medicine

## 2011-11-13 DIAGNOSIS — L02219 Cutaneous abscess of trunk, unspecified: Secondary | ICD-10-CM | POA: Insufficient documentation

## 2011-11-13 DIAGNOSIS — I10 Essential (primary) hypertension: Secondary | ICD-10-CM | POA: Insufficient documentation

## 2011-11-13 DIAGNOSIS — L03319 Cellulitis of trunk, unspecified: Secondary | ICD-10-CM | POA: Insufficient documentation

## 2011-11-13 DIAGNOSIS — Z79899 Other long term (current) drug therapy: Secondary | ICD-10-CM | POA: Insufficient documentation

## 2011-11-13 DIAGNOSIS — E119 Type 2 diabetes mellitus without complications: Secondary | ICD-10-CM | POA: Insufficient documentation

## 2011-11-13 DIAGNOSIS — M109 Gout, unspecified: Secondary | ICD-10-CM | POA: Insufficient documentation

## 2011-11-13 DIAGNOSIS — F17200 Nicotine dependence, unspecified, uncomplicated: Secondary | ICD-10-CM | POA: Insufficient documentation

## 2011-11-13 DIAGNOSIS — L0291 Cutaneous abscess, unspecified: Secondary | ICD-10-CM

## 2011-11-13 DIAGNOSIS — E785 Hyperlipidemia, unspecified: Secondary | ICD-10-CM | POA: Insufficient documentation

## 2011-11-13 DIAGNOSIS — E039 Hypothyroidism, unspecified: Secondary | ICD-10-CM | POA: Insufficient documentation

## 2011-11-13 DIAGNOSIS — G4730 Sleep apnea, unspecified: Secondary | ICD-10-CM | POA: Insufficient documentation

## 2011-11-13 MED ORDER — HYDROCORTISONE 2.5 % RE CREA: TOPICAL_CREAM | RECTAL | Status: AC

## 2011-11-13 MED ORDER — OXYCODONE-ACETAMINOPHEN 5-325 MG PO TABS: 1.0000 | ORAL_TABLET | ORAL | Status: AC | PRN

## 2011-11-13 MED ORDER — OXYCODONE-ACETAMINOPHEN 5-325 MG PO TABS
2.0000 | ORAL_TABLET | Freq: Once | ORAL | Status: AC
  Administered 2011-11-13: 2 via ORAL
  Filled 2011-11-13: qty 2

## 2011-11-13 MED ORDER — SULFAMETHOXAZOLE-TRIMETHOPRIM 800-160 MG PO TABS: 1.0000 | ORAL_TABLET | Freq: Two times a day (BID) | ORAL | Status: AC

## 2011-11-13 MED ORDER — LIDOCAINE HCL 2 % IJ SOLN
INTRAMUSCULAR | Status: AC
  Filled 2011-11-13: qty 1

## 2011-11-13 NOTE — ED Notes (Signed)
Pt from home with reports of a swollen, painful area that started on Thursday to the area between scrotum and rectum. Pt denies injury, hx of hemorrhoids but reports that area of concern is not in the rectum but between.

## 2011-11-13 NOTE — Discharge Instructions (Signed)
FOLLOW UP WITH DR. Everardo All IN 2 DAYS FOR RECHECK OF ABSCESS AND TO HAVE PACKING REMOVED. RETURN HERE WITH ANY WORSENING SYMPTOMS PRIOR TO 2 DAYS INCLUDING FEVER, SEVERE PAIN. TAKE MEDICATIONS AS PRESCRIBED.  Abscess An abscess (boil or furuncle) is an infected area that contains a collection of pus.  SYMPTOMS Signs and symptoms of an abscess include pain, tenderness, redness, or hardness. You may feel a moveable soft area under your skin. An abscess can occur anywhere in the body.  TREATMENT  A surgical cut (incision) may be made over your abscess to drain the pus. Gauze may be packed into the space or a drain may be looped through the abscess cavity (pocket). This provides a drain that will allow the cavity to heal from the inside outwards. The abscess may be painful for a few days, but should feel much better if it was drained.  Your abscess, if seen early, may not have localized and may not have been drained. If not, another appointment may be required if it does not get better on its own or with medications. HOME CARE INSTRUCTIONS   Only take over-the-counter or prescription medicines for pain, discomfort, or fever as directed by your caregiver.   Take your antibiotics as directed if they were prescribed. Finish them even if you start to feel better.   Keep the skin and clothes clean around your abscess.   If the abscess was drained, you will need to use gauze dressing to collect any draining pus. Dressings will typically need to be changed 3 or more times a day.   The infection may spread by skin contact with others. Avoid skin contact as much as possible.   Practice good hygiene. This includes regular hand washing, cover any draining skin lesions, and do not share personal care items.   If you participate in sports, do not share athletic equipment, towels, whirlpools, or personal care items. Shower after every practice or tournament.   If a draining area cannot be adequately covered:     Do not participate in sports.   Children should not participate in day care until the wound has healed or drainage stops.   If your caregiver has given you a follow-up appointment, it is very important to keep that appointment. Not keeping the appointment could result in a much worse infection, chronic or permanent injury, pain, and disability. If there is any problem keeping the appointment, you must call back to this facility for assistance.  SEEK MEDICAL CARE IF:   You develop increased pain, swelling, redness, drainage, or bleeding in the wound site.   You develop signs of generalized infection including muscle aches, chills, fever, or a general ill feeling.   You have an oral temperature above 102 F (38.9 C).  MAKE SURE YOU:   Understand these instructions.   Will watch your condition.   Will get help right away if you are not doing well or get worse.  Document Released: 05/12/2005 Document Revised: 07/22/2011 Document Reviewed: 03/05/2008 [facility] Patient Information 2012 [place], [place].

## 2011-11-13 NOTE — ED Provider Notes (Signed)
Medical screening examination/treatment/procedure(s) were performed by non-physician practitioner and as supervising physician I was immediately available for consultation/collaboration.       Shelda Jakes, MD 11/13/11 1115

## 2011-11-13 NOTE — ED Provider Notes (Signed)
History     CSN: [id number]  Arrival date & time 11/13/11  0905   First MD Initiated Contact with Patient 11/13/11 1044      Chief Complaint  Patient presents with  . Hemorrhoids  . Abscess    (Consider location/radiation/quality/duration/timing/severity/associated sxs/prior treatment) Patient is a 51 y.o. male presenting with abscess. The history is provided by the patient.  Abscess  This is a new problem. The current episode started less than one week ago. The problem occurs continuously. The problem has been gradually worsening. Affected Location: C/O perirectal pain and swelling with pain extending forward to scrotum. The problem is moderate. The abscess is characterized by painfulness and swelling. Pertinent negatives include no fever. Associated symptoms comments: He has a history of hemorrhoids and has been using steroidal cream without improvement. . There were no sick contacts.    Past Medical History  Diagnosis Date  . HYPOTHYROIDISM 09/19/2007  . DIABETES MELLITUS, TYPE II 09/19/2007  . HYPERLIPIDEMIA 09/19/2007  . GOUT 04/17/2010  . ANXIETY 09/19/2007  . ALCOHOL USE 10/24/2008  . SMOKER 04/17/2010  . HYPERTENSION 09/19/2007  . External thrombosed hemorrhoids 08/23/2008  . OTHER DISEASES OF LUNG NOT ELSEWHERE CLASSIFIED 07/20/2010  . SCROTAL ABSCESS 05/27/2010  . Sebaceous cyst 08/01/2008  . OSTEOARTHRITIS 09/19/2007  . SLEEP APNEA 08/22/2009  . Rash and other nonspecific skin eruption 09/19/2007  . Cough 05/22/2008    Past Surgical History  Procedure Date  . Nasal septum surgery     Family History  Problem Relation Age of Onset  . Cancer Mother     Breast Cancer    History  Substance Use Topics  . Smoking status: Current Everyday Smoker -- 0.5 packs/day  . Smokeless tobacco: Never Used  . Alcohol Use: Yes     pt says he needs to minimize      Review of Systems  Constitutional: Negative for fever and chills.  Gastrointestinal: Negative.   Genitourinary:       See  HPI.  Musculoskeletal: Negative.   Skin: Negative.     Allergies  Review of patient's allergies indicates no known allergies.  Home Medications   Current Outpatient Rx  Name Route Sig Dispense Refill  . ALPRAZOLAM 0.25 MG PO TABS Oral Take 0.5 mg by mouth at bedtime as needed. For sleep    . BD PEN NEEDLE ULTRAFINE 29G X 12.7MM MISC  USE TWICE A DAY AS DIRECTED 100 each 3  . DICLOFENAC SODIUM 75 MG PO TBEC  TAKE 1 TABLET TWICE A DAY AS NEEDED FOR PAIN 180 tablet 0  . FENOFIBRATE MICRONIZED 134 MG PO CAPS  TAKE ONE CAPSULE BY MOUTH EVERY DAY 30 capsule 4  . GLUCOSE BLOOD VI STRP  Two times a day dx 250.01     . INSULIN PEN NEEDLE 31G X 8 MM MISC  Use as directed two times a day    . LEVEMIR FLEXPEN 100 UNIT/ML [place] SOLN  INJECT 325 UNITS EVERY MORNING AS DIRECTED 100 mL 5  . LEVOTHYROXINE SODIUM 150 MCG PO TABS  TAKE 1 TABLET EVERY DAY 30 tablet 3  . METOPROLOL TARTRATE 50 MG PO TABS  TAKE 1 TABLET BY MOUTH TWICE A DAY 60 tablet 4  . SIMVASTATIN 40 MG PO TABS Oral Take 20 mg by mouth at bedtime. Take 1/2 at bedtime    . OMEPRAZOLE MAGNESIUM 20 MG PO TBEC Oral Take 20 mg by mouth daily as needed. For indigestion      BP 161/72  Pulse 97  Temp(Src) 97.8 F (36.6 C) (Oral)  Resp 18  SpO2 99%  Physical Exam  Constitutional: He is oriented to person, place, and time. He appears well-developed and well-nourished.  Neck: Normal range of motion.  Pulmonary/Chest: Effort normal.  Genitourinary:       Scrotum non-tender, without swelling. No testicular tenderness. Large area of significant tenderness, induration and redness in peroneum c/w cutaneous abscess without active drainage. Large peri-rectal skin tag without tenderness, thrombosis or inflammation. Anus is non-tender. No bleeding.   Musculoskeletal: Normal range of motion.  Neurological: He is alert and oriented to person, place, and time.  Skin: Skin is warm and dry.  Psychiatric: He has a normal mood and affect.    ED Course    Procedures (including critical care time) INCISION AND DRAINAGE Performed by: Langley Adie A Consent: Verbal consent obtained. Risks and benefits: risks, benefits and alternatives were discussed Type: abscess  Body area: perineum  Anesthesia: local infiltration  Local anesthetic: lidocaine 1% w/o epinephrine  Anesthetic total: 2 ml  Complexity: complex Blunt dissection to break up loculations  Drainage: purulent  Drainage amount: large  Packing material: 1/4 in iodoform gauze  Patient tolerance: Patient tolerated the procedure well with no immediate complications.    Labs Reviewed - No data to display No results found.   No diagnosis found.    MDM          Rodena Medin, PA-C 11/13/11 1109

## 2011-11-15 ENCOUNTER — Telehealth: Payer: Self-pay | Admitting: Pulmonary Disease

## 2011-11-15 ENCOUNTER — Telehealth: Payer: Self-pay

## 2011-11-15 LAB — WOUND CULTURE

## 2011-11-15 NOTE — Telephone Encounter (Signed)
Spoke with pt. He is requesting an order for "gel cpap mask"- states that he can buy one on line and this would be cheaper than his DME. Pt states that his mask is "basically falling apart" and needs new mask asap. I advised will be happy to ask Dr. Vassie Loll about prescription for a new mask, but needs ov to be scheduled since he has been non compliant with followup. He was last seen 09/02/09 and was told to follow up in 1 yr. He refused ov stating can not afford and this is not necessary since he is doing fine and nothing has changed. I advised will send msg to [facility] and see if he feels ok with giving rx for new mask. Please advise thanks!

## 2011-11-15 NOTE — Telephone Encounter (Signed)
I spoke with pt and he states he is going to see his pcp tomorrow and work this out with him. Will sign off message

## 2011-11-15 NOTE — Telephone Encounter (Signed)
Needs OV - if not seen in 2 yrs OK to make with TP if this si the only issue

## 2011-11-15 NOTE — Telephone Encounter (Signed)
[facility] Triage Call Report Triage Record Num: [id number] Operator: Micah Flesher Patient Name: Johnny Andrews Call Date & Time: 11/12/2011 2:08:05PM Patient Phone: [phone] PCP: Patient Gender: Male PCP Fax : Patient DOB: [birth year] [facility] Name[facility] Reason for Call: Caller: Johnny Andrews/Patient; PCP: Romero Belling; CB#: [phone]; Call regarding Hemorrhoids; Onset- approx several days ago. Guideline: Rectal Symptoms. Disposition: See ED Immediately due to "Constant pain in rectum or rectal area AND swelling, boil or abscess around rectum, with or without foul-smelling pus-like discharge." Pt rather wait till next week for an appt. Protocol(s) Used: Rectal Symptoms Recommended Outcome per Protocol: See ED Immediately Reason for Outcome: Constant pain in rectum or rectal area AND swelling, boil or abscess around rectum, with or without foul-smelling, pus-like drainage Care Advice: ~ IMMEDIATE ACTION 11/12/2011 2:23:12PM Page 1 of 1 [email or web address]

## 2011-11-16 ENCOUNTER — Encounter: Payer: Self-pay | Admitting: Internal Medicine

## 2011-11-16 ENCOUNTER — Ambulatory Visit (INDEPENDENT_AMBULATORY_CARE_PROVIDER_SITE_OTHER): Payer: BC Managed Care – PPO | Admitting: Internal Medicine

## 2011-11-16 VITALS — BP 144/82 | HR 92 | Temp 97.3°F | Ht 75.0 in | Wt 348.1 lb

## 2011-11-16 DIAGNOSIS — K612 Anorectal abscess: Secondary | ICD-10-CM

## 2011-11-16 DIAGNOSIS — I10 Essential (primary) hypertension: Secondary | ICD-10-CM

## 2011-11-16 DIAGNOSIS — E119 Type 2 diabetes mellitus without complications: Secondary | ICD-10-CM

## 2011-11-16 DIAGNOSIS — K611 Rectal abscess: Secondary | ICD-10-CM

## 2011-11-16 NOTE — Assessment & Plan Note (Signed)
Improved, to finish antibx,  to f/u any worsening symptoms or concerns  

## 2011-11-16 NOTE — Progress Notes (Signed)
Subjective:    Patient ID: Johnny Andrews, male    DOB: [birth year], 51 y.o.   MRN: [id number]  HPI  Here to f/u left perirectal abscess, seen and tx in ER 3 days ago, advised f/u here;  Packing fallen out, pain/red/sweling much improved, no further drainage, no fever, chills.  No new complaints.   Needs new CPAP mask, trying to avoid cost of pulm f/u.  Pt denies chest pain, increased sob or doe, wheezing, orthopnea, PND, increased LE swelling, palpitations, dizziness or syncope.  Pt denies new neurological symptoms such as new headache, or facial or extremity weakness or numbness   Pt denies polydipsia, polyuria.  Sees Dr Glenna Fellows for DM.  No recent a1c on record.  Not seen in quite some time.   Lab Results  Component Value Date   HGBA1C 10.6* 04/16/2010    Past Medical History  Diagnosis Date  . HYPOTHYROIDISM 09/19/2007  . DIABETES MELLITUS, TYPE II 09/19/2007  . HYPERLIPIDEMIA 09/19/2007  . GOUT 04/17/2010  . ANXIETY 09/19/2007  . ALCOHOL USE 10/24/2008  . SMOKER 04/17/2010  . HYPERTENSION 09/19/2007  . External thrombosed hemorrhoids 08/23/2008  . OTHER DISEASES OF LUNG NOT ELSEWHERE CLASSIFIED 07/20/2010  . SCROTAL ABSCESS 05/27/2010  . Sebaceous cyst 08/01/2008  . OSTEOARTHRITIS 09/19/2007  . SLEEP APNEA 08/22/2009  . Rash and other nonspecific skin eruption 09/19/2007  . Cough 05/22/2008   Past Surgical History  Procedure Date  . Nasal septum surgery     reports that he has been smoking.  He has never used smokeless tobacco. He reports that he drinks alcohol. He reports that he does not use illicit drugs. family history includes Cancer in his mother. No Known Allergies Current Outpatient Prescriptions on File Prior to Visit  Medication Sig Dispense Refill  . ALPRAZolam (XANAX) 0.25 MG tablet Take 0.5 mg by mouth at bedtime as needed. For sleep      . BD ULTRA-FINE PEN NEEDLES 29G X 12.7MM MISC USE TWICE A DAY AS DIRECTED  100 each  3  . diclofenac (VOLTAREN) 75 MG EC tablet TAKE 1 TABLET  TWICE A DAY AS NEEDED FOR PAIN  180 tablet  0  . fenofibrate micronized (LOFIBRA) 134 MG capsule TAKE ONE CAPSULE BY MOUTH EVERY DAY  30 capsule  4  . glucose blood (ONE TOUCH ULTRA TEST) test strip Two times a day dx 250.01       . hydrocortisone (ANUSOL-HC) 2.5 % rectal cream Apply rectally 2 times daily  30 g  0  . Insulin Pen Needle (B-D ULTRAFINE III SHORT PEN) 31G X 8 MM MISC Use as directed two times a day      . LEVEMIR FLEXPEN 100 UNIT/ML injection INJECT 325 UNITS EVERY MORNING AS DIRECTED  100 mL  5  . levothyroxine (SYNTHROID, LEVOTHROID) 150 MCG tablet TAKE 1 TABLET EVERY DAY  30 tablet  3  . metoprolol (LOPRESSOR) 50 MG tablet TAKE 1 TABLET BY MOUTH TWICE A DAY  60 tablet  4  . omeprazole (PRILOSEC OTC) 20 MG tablet Take 20 mg by mouth daily as needed. For indigestion      . oxyCODONE-acetaminophen (PERCOCET) 5-325 MG per tablet Take 1 tablet by mouth every 4 (four) hours as needed for pain.  12 tablet  0  . simvastatin (ZOCOR) 40 MG tablet Take 20 mg by mouth at bedtime. Take 1/2 at bedtime      . sulfamethoxazole-trimethoprim (SEPTRA DS) 800-160 MG per tablet Take 1 tablet by mouth every  12 (twelve) hours.  14 tablet  0   Review of Systems Review of Systems  Constitutional: Negative for diaphoresis and unexpected weight change.  Gastrointestinal: Negative for vomiting and blood in stool.  Genitourinary: Negative for hematuria and decreased urine volume.  Musculoskeletal: Negative for gait problem.    Objective:   Physical Exam BP 144/82  Pulse 92  Temp(Src) 97.3 F (36.3 C) (Oral)  Ht 6\' 3"  (1.905 m)  Wt 348 lb 2 oz (157.908 kg)  BMI 43.51 kg/m2  SpO2 95% Physical Exam  VS noted, morbid obese.  HENT: Head: Normocephalic.  Right Ear: External ear normal.  Left Ear: External ear normal.  Eyes: Conjunctivae and EOM are normal. Pupils are equal, round, and reactive to light.  Neck: Normal range of motion. Neck supple.  Cardiovascular: Normal rate and regular rhythm.     Pulmonary/Chest: Effort normal and breath sounds normal.  Skin: Skin is warm. No erythema. excet for 1 cm area left perirectal abscess with minor tender, nonfluctuant, nondraining Psychiatric: Pt behavior is normal. Thought content normal.     Assessment & Plan:

## 2011-11-16 NOTE — Assessment & Plan Note (Addendum)
Uncontrolled by a1c most recent, no recent endo f/u; encouraged pt to make f/u appt Lab Results  Component Value Date   HGBA1C 10.6* 04/16/2010

## 2011-11-16 NOTE — Assessment & Plan Note (Signed)
stable overall by hx and exam but not clear as well controlled as should be, goal < 140.90 at minimumn, most recent data reviewed with pt, and pt to continue medical treatment as before  BP Readings from Last 3 Encounters:  11/16/11 144/82  11/13/11 161/72  04/16/11 140/94

## 2011-11-16 NOTE — Patient Instructions (Signed)
No further packing to remove Please finish your antibiotic Continue all other medications as before Please have the pharmacy call with any refills you may need. Please call your pulmonary who should handle the CPAP supplies

## 2011-11-16 NOTE — ED Notes (Signed)
+   wound  Moderate group B strep Chart sent to EDP office for review.

## 2011-11-23 ENCOUNTER — Encounter: Payer: Self-pay | Admitting: Pulmonary Disease

## 2011-11-24 ENCOUNTER — Telehealth: Payer: Self-pay | Admitting: *Deleted

## 2011-11-24 ENCOUNTER — Ambulatory Visit (INDEPENDENT_AMBULATORY_CARE_PROVIDER_SITE_OTHER): Payer: BC Managed Care – PPO | Admitting: Pulmonary Disease

## 2011-11-24 ENCOUNTER — Encounter: Payer: Self-pay | Admitting: Pulmonary Disease

## 2011-11-24 VITALS — BP 138/96 | HR 112 | Temp 97.8°F | Ht 75.0 in | Wt 348.8 lb

## 2011-11-24 DIAGNOSIS — E119 Type 2 diabetes mellitus without complications: Secondary | ICD-10-CM

## 2011-11-24 DIAGNOSIS — G4730 Sleep apnea, unspecified: Secondary | ICD-10-CM

## 2011-11-24 DIAGNOSIS — F17200 Nicotine dependence, unspecified, uncomplicated: Secondary | ICD-10-CM

## 2011-11-24 DIAGNOSIS — Z0000 Encounter for general adult medical examination without abnormal findings: Secondary | ICD-10-CM

## 2011-11-24 DIAGNOSIS — Z0389 Encounter for observation for other suspected diseases and conditions ruled out: Secondary | ICD-10-CM

## 2011-11-24 MED ORDER — ADULT MASK MISC: 2.0000 | Freq: Once | Status: AC

## 2011-11-24 NOTE — Telephone Encounter (Signed)
Message copied by Carin Primrose on Wed Nov 24, 2011  3:25 PM ------      Message from: Newell Coral      Created: Tue Nov 09, 2011  4:01 PM      Regarding: cpe sch, needs labs       The pt is hoping to get labs done before his cpe.  He is hoping a liver panel also.

## 2011-11-24 NOTE — Assessment & Plan Note (Signed)
Weight loss encouraged, compliance with goal of at least 4-6 hrs every night is the expectation. Advised against medications with sedative side effects Cautioned against driving when sleepy - understanding that sleepiness will vary on a day to day basis Chk download

## 2011-11-24 NOTE — Assessment & Plan Note (Signed)
He enquired about nicotine patches & I have referred him to the smoking cessation program We discussed lung cancer screening with chest CT - discussed false positive results & he would prefer to avoid 'needless anxiety'

## 2011-11-24 NOTE — Telephone Encounter (Signed)
Labs enter into Epic for CPX appointment.

## 2011-11-24 NOTE — Patient Instructions (Signed)
We will have DME check out the CPAP pressure Rx for new mask OK to take allergy pill - claritin Nicotine patches ok - smoking cessation program at [facility] - phone no

## 2011-11-24 NOTE — Progress Notes (Signed)
  Subjective:    Patient ID: Johnny Andrews, male    DOB: 1961/07/18, 51 y.o.   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  PSG 7/00 >> severe obstructive sleep apnea with RDI 125/h, wt 335  rpt 11/04, wt 350 >> severe obstructive sleep apnea RDI 54/h, corrected sub- optimally with CPAP 8 cm .He drinks a six-pack almost everyday from 5.30 to dinner time -8.30p. Bedtime is 10p, sleep latency is minimal, 2-3 awakenings for BR visits, gets up at 6 30 A feeling tired with dryness of mouth. He sometimes wakes up at 2 A & then needs to take 0.25 mg of xanax for insomnia.   reports compliance with CPAP, wife has not noted snoring, leak + occasional   11/24/2011  2 yr FU - He is requesting an order for "gel cpap mask"- states that he can buy one on line and this would be cheaper than his DME. Pt states that his mask is "basically falling apart" and needs new mask He was last seen 09/02/09 and was told to follow up in 1 yr.  Does not use humidifier, no snoring  Uses xanax for anxiety/ insomnia on occasion  Got the 'flu' 1 month ago , now has nasal stuffiness -?allergies Smokes 1/2 PPD, enquires about cancer screening & nicotine patches Wt remains 348 lbs   Review of Systems Patient denies significant dyspnea,cough, hemoptysis,  chest pain, palpitations, pedal edema, orthopnea, paroxysmal nocturnal dyspnea, lightheadedness, nausea, vomiting, abdominal or  leg pains      Objective:   Physical Exam  Gen. Pleasant, obese, in no distress ENT - no lesions, no post nasal drip Neck: No JVD, no thyromegaly, no carotid bruits Lungs: no use of accessory muscles, no dullness to percussion, decreased without rales or rhonchi  Cardiovascular: Rhythm regular, heart sounds  normal, no murmurs or gallops, no peripheral edema Musculoskeletal: No deformities, no cyanosis or clubbing , no tremors       Assessment & Plan:

## 2011-11-30 ENCOUNTER — Ambulatory Visit (INDEPENDENT_AMBULATORY_CARE_PROVIDER_SITE_OTHER): Payer: BC Managed Care – PPO | Admitting: Endocrinology

## 2011-11-30 ENCOUNTER — Encounter: Payer: Self-pay | Admitting: Endocrinology

## 2011-11-30 ENCOUNTER — Other Ambulatory Visit (INDEPENDENT_AMBULATORY_CARE_PROVIDER_SITE_OTHER): Payer: BC Managed Care – PPO

## 2011-11-30 VITALS — BP 134/82 | HR 103 | Temp 98.4°F | Ht 75.0 in | Wt 348.4 lb

## 2011-11-30 DIAGNOSIS — Z0389 Encounter for observation for other suspected diseases and conditions ruled out: Secondary | ICD-10-CM

## 2011-11-30 DIAGNOSIS — R972 Elevated prostate specific antigen [PSA]: Secondary | ICD-10-CM

## 2011-11-30 DIAGNOSIS — Z0000 Encounter for general adult medical examination without abnormal findings: Secondary | ICD-10-CM

## 2011-11-30 DIAGNOSIS — F411 Generalized anxiety disorder: Secondary | ICD-10-CM

## 2011-11-30 DIAGNOSIS — E119 Type 2 diabetes mellitus without complications: Secondary | ICD-10-CM

## 2011-11-30 LAB — URINALYSIS, ROUTINE W REFLEX MICROSCOPIC
Specific Gravity, Urine: 1.025 (ref 1.000–1.030)
Urine Glucose: >=1000
Urobilinogen, UA: 0.2 (ref 0.0–1.0)

## 2011-11-30 LAB — BASIC METABOLIC PANEL
BUN: 11 mg/dL (ref 6–23)
Calcium: 9.3 mg/dL (ref 8.4–10.5)
Creatinine, Ser: 0.9 mg/dL (ref 0.4–1.5)
GFR: 97.11 mL/min (ref >60.00)
Glucose, Bld: 344 mg/dL — ABNORMAL HIGH (ref 70–99)

## 2011-11-30 LAB — CBC WITH DIFFERENTIAL/PLATELET
Basophils Relative: 0.1 % (ref 0.0–3.0)
Eosinophils Absolute: 0.1 10*3/uL (ref 0.0–0.7)
Eosinophils Relative: 1.5 % (ref 0.0–5.0)
HCT: 44.9 % (ref 39.0–52.0)
Lymphs Abs: 0.7 10*3/uL (ref 0.7–4.0)
MCHC: 34.4 g/dL (ref 30.0–36.0)
MCV: 87.6 fl (ref 78.0–100.0)
Monocytes Absolute: 0.6 10*3/uL (ref 0.1–1.0)
Neutro Abs: 6.4 10*3/uL (ref 1.4–7.7)
Neutrophils Relative %: 82.4 % — ABNORMAL HIGH (ref 43.0–77.0)
RBC: 5.13 Mil/uL (ref 4.22–5.81)
WBC: 7.7 10*3/uL (ref 4.5–10.5)

## 2011-11-30 LAB — PSA: PSA: 4.64 ng/mL — ABNORMAL HIGH (ref 0.10–4.00)

## 2011-11-30 LAB — HEPATIC FUNCTION PANEL
AST: 40 U/L — ABNORMAL HIGH (ref 0–37)
Albumin: 3.6 g/dL (ref 3.5–5.2)
Total Bilirubin: 0.6 mg/dL (ref 0.3–1.2)

## 2011-11-30 LAB — LIPID PANEL
Cholesterol: 172 mg/dL (ref 0–200)
HDL: 40.9 mg/dL (ref >39.00)
Total CHOL/HDL Ratio: 4
Triglycerides: 613 mg/dL — ABNORMAL HIGH (ref 0.0–149.0)
VLDL: 122.6 mg/dL — ABNORMAL HIGH (ref 0.0–40.0)

## 2011-11-30 LAB — HEMOGLOBIN A1C: Hgb A1c MFr Bld: 11.4 % — ABNORMAL HIGH (ref 4.6–6.5)

## 2011-11-30 MED ORDER — ALPRAZOLAM 1 MG PO TABS: 1.0000 mg | ORAL_TABLET | Freq: Every evening | ORAL | Status: DC | PRN

## 2011-11-30 MED ORDER — LEVOTHYROXINE SODIUM 175 MCG PO TABS: 175.0000 ug | ORAL_TABLET | Freq: Every day | ORAL | Status: DC

## 2011-11-30 NOTE — Progress Notes (Signed)
Subjective:    Patient ID: Johnny Andrews, male    DOB: Dec 03, 1960, 51 y.o.   MRN: [id number]  HPI Pt says he has h/o low-back pain, but it has been much better over the past few years.  He now has few weeks of moderate pain at the lower back, but no assoc numbness.  He feels this was caused by excessive lifting and pushing/pulling, but no specific injury.  voltaren helps, but does not relieve it.   no cbg record, but states cbg's are persistently over 200. Past Medical History  Diagnosis Date  . HYPOTHYROIDISM 09/19/2007  . DIABETES MELLITUS, TYPE II 09/19/2007  . HYPERLIPIDEMIA 09/19/2007  . GOUT 04/17/2010  . ANXIETY 09/19/2007  . ALCOHOL USE 10/24/2008  . SMOKER 04/17/2010  . HYPERTENSION 09/19/2007  . External thrombosed hemorrhoids 08/23/2008  . OTHER DISEASES OF LUNG NOT ELSEWHERE CLASSIFIED 07/20/2010  . SCROTAL ABSCESS 05/27/2010  . Sebaceous cyst 08/01/2008  . OSTEOARTHRITIS 09/19/2007  . SLEEP APNEA 08/22/2009  . Rash and other nonspecific skin eruption 09/19/2007  . Cough 05/22/2008    Past Surgical History  Procedure Date  . Nasal septum surgery     History   Social History  . Marital Status: Married    Spouse Name: N/A    Number of Children: N/A  . Years of Education: N/A   Occupational History  . [employer]   Social History Main Topics  . Smoking status: Current Everyday Smoker -- 0.5 packs/day  . Smokeless tobacco: Never Used  . Alcohol Use: Yes     pt says he needs to minimize  . Drug Use: No  . Sexually Active:    Other Topics Concern  . Not on file   Social History Narrative   Regular exercise-no    Current Outpatient Prescriptions on File Prior to Visit  Medication Sig Dispense Refill  . BD ULTRA-FINE PEN NEEDLES 29G X 12.7MM MISC USE TWICE A DAY AS DIRECTED  100 each  3  . diclofenac (VOLTAREN) 75 MG EC tablet TAKE 1 TABLET TWICE A DAY AS NEEDED FOR PAIN  180 tablet  0  . fenofibrate micronized (LOFIBRA) 134 MG capsule TAKE ONE CAPSULE BY MOUTH EVERY DAY   30 capsule  4  . glucose blood (ONE TOUCH ULTRA TEST) test strip Two times a day dx 250.01       . insulin detemir (LEVEMIR FLEXPEN) 100 UNIT/ML injection       . Insulin Pen Needle (B-D ULTRAFINE III SHORT PEN) 31G X 8 MM MISC Use as directed two times a day      . metoprolol (LOPRESSOR) 50 MG tablet TAKE 1 TABLET BY MOUTH TWICE A DAY  60 tablet  4  . omeprazole (PRILOSEC OTC) 20 MG tablet Take 20 mg by mouth daily as needed. For indigestion      . Respiratory Therapy Supplies (ADULT MASK) MISC 2 Devices by Does not apply route once.  2 each  0  . simvastatin (ZOCOR) 40 MG tablet Take 20 mg by mouth at bedtime.       Marland Kitchen levothyroxine (SYNTHROID, LEVOTHROID) 175 MCG tablet Take 1 tablet (175 mcg total) by mouth daily.  30 tablet  11    No Known Allergies  Family History  Problem Relation Age of Onset  . Breast cancer Mother     Breast Cancer    BP 134/82  Pulse 103  Temp(Src) 98.4 F (36.9 C) (Oral)  Ht 6\' 3"  (1.905 m)  Wt 348  lb 6.4 oz (158.033 kg)  BMI 43.55 kg/m2  SpO2 96%   Review of Systems  Respiratory: Negative for shortness of breath.   Cardiovascular: Negative for chest pain.  Gastrointestinal: Negative for blood in stool.  Genitourinary: Negative for hematuria.  Skin: Negative for rash.  Hematological: Negative for adenopathy.  Psychiatric/Behavioral: The patient is nervous/anxious.    Denies bowel or bladder retention. He has a few days of fatigue, tremor, excessive diaphoresis, myalgias, headache, and diarrhea.  No fever.  denies hypoglycemia. He has nausea but no vomiting.    Objective:   Physical Exam VITAL SIGNS:  See vs page GENERAL: no distress Spine: nontender. Gait: normal and steady ABDOMEN:  abdomen is soft, nontender.  no hepatosplenomegaly. not distended.  no hernia   Lab Results  Component Value Date   WBC 7.7 11/30/2011   HGB 15.4 11/30/2011   HCT 44.9 11/30/2011   PLT 224.0 11/30/2011   GLUCOSE 344* 11/30/2011   CHOL 172 11/30/2011   TRIG  613.0* 11/30/2011   HDL 40.90 11/30/2011   LDLDIRECT 58.7 11/30/2011   ALT 41 11/30/2011   AST 40* 11/30/2011   NA 134* 11/30/2011   K 4.1 11/30/2011   CL 98 11/30/2011   CREATININE 0.9 11/30/2011   BUN 11 11/30/2011   CO2 27 11/30/2011   TSH 7.96* 11/30/2011   PSA 4.64* 11/30/2011   HGBA1C 11.4* 11/30/2011      Assessment & Plan:  Low-back pain, recurrent DM.  needs increased rx.  therapy limited by noncompliance.  i'll do the best i can. elev psa.  new

## 2011-11-30 NOTE — Patient Instructions (Addendum)
blood tests are being requested for you today.  You will receive a letter with results. I hope you feel better soon.  If you don't feel better by next week, please call back. Increase insulin to 375 units each morning.  Take aprazolam 1 mg, 1 at night as needed for anxiety.   Refer to a psychiatry specialist.  you will receive a phone call, about a day and time for an appointment Please come in soon for a regular physical.

## 2011-12-04 ENCOUNTER — Other Ambulatory Visit: Payer: Self-pay | Admitting: Endocrinology

## 2011-12-06 ENCOUNTER — Other Ambulatory Visit: Payer: Self-pay | Admitting: Endocrinology

## 2011-12-06 ENCOUNTER — Other Ambulatory Visit: Payer: Self-pay | Admitting: *Deleted

## 2011-12-06 MED ORDER — INSULIN DETEMIR 100 UNIT/ML ~~LOC~~ SOLN: 375.0000 [IU] | Freq: Every morning | SUBCUTANEOUS | Status: DC

## 2011-12-06 NOTE — Telephone Encounter (Signed)
R'cd call from [facility] pharmacy for updated Levemir rx- dosage increase.

## 2011-12-13 ENCOUNTER — Ambulatory Visit (INDEPENDENT_AMBULATORY_CARE_PROVIDER_SITE_OTHER): Payer: BC Managed Care – PPO | Admitting: Endocrinology

## 2011-12-13 ENCOUNTER — Encounter: Payer: Self-pay | Admitting: Endocrinology

## 2011-12-13 VITALS — BP 132/92 | HR 95 | Temp 98.1°F | Ht 75.0 in | Wt 344.0 lb

## 2011-12-13 DIAGNOSIS — E119 Type 2 diabetes mellitus without complications: Secondary | ICD-10-CM

## 2011-12-13 DIAGNOSIS — Z23 Encounter for immunization: Secondary | ICD-10-CM

## 2011-12-13 NOTE — Progress Notes (Signed)
Subjective:    Patient ID: Johnny Andrews, male    DOB: [birth year], 51 y.o.   MRN: [id number]  HPI here for regular wellness examination.  He's feeling pretty well in general, and says chronic med probs are stable, except as noted below.   Past Medical History  Diagnosis Date  . HYPOTHYROIDISM 09/19/2007  . DIABETES MELLITUS, TYPE II 09/19/2007  . HYPERLIPIDEMIA 09/19/2007  . GOUT 04/17/2010  . ANXIETY 09/19/2007  . ALCOHOL USE 10/24/2008  . SMOKER 04/17/2010  . HYPERTENSION 09/19/2007  . External thrombosed hemorrhoids 08/23/2008  . OTHER DISEASES OF LUNG NOT ELSEWHERE CLASSIFIED 07/20/2010  . SCROTAL ABSCESS 05/27/2010  . Sebaceous cyst 08/01/2008  . OSTEOARTHRITIS 09/19/2007  . SLEEP APNEA 08/22/2009  . Rash and other nonspecific skin eruption 09/19/2007  . Cough 05/22/2008    Past Surgical History  Procedure Date  . Nasal septum surgery     History   Social History  . Marital Status: Married    Spouse Name: N/A    Number of Children: N/A  . Years of Education: N/A   Occupational History  . [employer]   Social History Main Topics  . Smoking status: Current Everyday Smoker -- 0.5 packs/day  . Smokeless tobacco: Never Used  . Alcohol Use: Yes     pt says he needs to minimize  . Drug Use: No  . Sexually Active:    Other Topics Concern  . Not on file   Social History Narrative   Regular exercise-no    Current Outpatient Prescriptions on File Prior to Visit  Medication Sig Dispense Refill  . ALPRAZolam (XANAX) 1 MG tablet Take 1 tablet (1 mg total) by mouth at bedtime as needed for sleep.  30 tablet  2  . BD ULTRA-FINE PEN NEEDLES 29G X 12.7MM MISC USE TWICE A DAY AS DIRECTED  100 each  3  . diclofenac (VOLTAREN) 75 MG EC tablet TAKE 1 TABLET TWICE A DAY AS NEEDED FOR PAIN  180 tablet  1  . fenofibrate micronized (LOFIBRA) 134 MG capsule TAKE ONE CAPSULE BY MOUTH EVERY DAY  30 capsule  4  . glucose blood (ONE TOUCH ULTRA TEST) test strip Two times a day dx 250.01       .  insulin detemir (LEVEMIR) 100 UNIT/ML injection Inject 450 Units into the skin every morning. As directed      . Insulin Pen Needle (B-D ULTRAFINE III SHORT PEN) 31G X 8 MM MISC Use as directed two times a day      . levothyroxine (SYNTHROID, LEVOTHROID) 175 MCG tablet Take 1 tablet (175 mcg total) by mouth daily.  30 tablet  11  . metoprolol (LOPRESSOR) 50 MG tablet TAKE 1 TABLET BY MOUTH TWICE A DAY  60 tablet  4  . omeprazole (PRILOSEC OTC) 20 MG tablet Take 20 mg by mouth daily as needed. For indigestion      . Respiratory Therapy Supplies (ADULT MASK) MISC 2 Devices by Does not apply route once.  2 each  0  . simvastatin (ZOCOR) 40 MG tablet Take 20 mg by mouth at bedtime.         No Known Allergies  Family History  Problem Relation Age of Onset  . Breast cancer Mother     Breast Cancer    BP 132/92  Pulse 95  Temp(Src) 98.1 F (36.7 C) (Oral)  Ht 6\' 3"  (1.905 m)  Wt 344 lb (156.037 kg)  BMI 43.00 kg/m2  SpO2 97%  Review of Systems  Constitutional: Negative for fever.  HENT: Negative for hearing loss.   Eyes: Negative for visual disturbance.  Respiratory: Negative for shortness of breath.   Cardiovascular: Negative for chest pain.  Gastrointestinal: Negative for anal bleeding.  Genitourinary: Negative for hematuria and difficulty urinating.  Musculoskeletal: Negative for back pain.  Skin: Negative for rash.  Neurological: Negative for syncope and numbness.  Hematological: Does not bruise/bleed easily.  Psychiatric/Behavioral: Negative for dysphoric mood.      Objective:   Physical Exam VS: see vs page GEN: no distress HEAD: head: no deformity eyes: no periorbital swelling, no proptosis external nose and ears are normal mouth: no lesion seen NECK: supple, thyroid is not enlarged CHEST WALL: no deformity LUNGS: clear to auscultation BREASTS:  No gynecomastia CV: reg rate and rhythm, no murmur ABD: abdomen is soft, nontender.  no hepatosplenomegaly.  not  distended.  no hernia GENITALIA/RECTAL/PROSTATE:  Will see urol soon MUSCULOSKELETAL: muscle bulk and strength are grossly normal.  no obvious joint swelling.  gait is normal and steady EXTEMITIES: no deformity.  no ulcer on the feet.  feet are of normal color and temp.  no edema.  There is bilateral onychomycosis. NEURO:  cn 2-12 grossly intact.   readily moves all 4's.  SKIN:  Normal texture and temperature.  No rash or suspicious lesion is visible.   NODES:  None palpable at the neck.   PSYCH: alert, oriented x3.  Does not appear anxious nor depressed.         Assessment & Plan:  Wellness visit today, with problems stable, except as noted.   SEPARATE EVALUATION FOLLOWS--EACH PROBLEM HERE IS NEW, NOT RESPONDING TO TREATMENT, OR POSES SIGNIFICANT RISK TO THE PATIENT'S HEALTH: HISTORY OF THE PRESENT ILLNESS: Pt says cbg's are still elevated on days when he is inactive.  However, on days when he is active, he has mild hypoglycemia.   PAST MEDICAL HISTORY reviewed and up to date today.   REVIEW OF SYSTEMS: Denies weight change PHYSICAL EXAMINATION: VITAL SIGNS:  See vs page GENERAL: no distress Pulses: dorsalis pedis intact bilat.  no carotid bruit Neuro:  sensation is intact to touch on the feet, but slightly decreased from normal LAB/XRAY RESULTS: Lab Results  Component Value Date   HGBA1C 11.4* 11/30/2011  IMPRESSION: DM.  needs increased rx HTN, with ? Of situational component. PLAN: See instruction page We'll recheck BP upon return.

## 2011-12-13 NOTE — Patient Instructions (Addendum)
On days when you are not active, take levemir 450 unit that morning.  On days when you are active, take only 200 units.   please consider these measures for your health:  minimize alcohol.  do not use tobacco products.  have a colonoscopy at least every 10 years from age 51.  keep firearms safely stored.  always use seat belts.  have working smoke alarms in your home.  see an eye doctor and dentist regularly.  never drive under the influence of alcohol or drugs (including prescription drugs).  those with fair skin should take precautions against the sun. Please come back for a follow-up appointment in 3 months. good diet and exercise habits significanly improve the control of your diabetes.  please let me know if you wish to be referred to a dietician.  high blood sugar is very risky to your health.  you should see an eye doctor every year. controlling your blood pressure and cholesterol drastically reduces the damage diabetes does to your body.  this also applies to quitting smoking.  please discuss these with your doctor.  you should take an aspirin every day, unless you have been advised by a doctor not to. check your blood sugar 2 times a day.  vary the time of day when you check, between before the 3 meals, and at bedtime.  also check if you have symptoms of your blood sugar being too high or too low.  please keep a record of the readings and bring it to your next appointment here.  please call us sooner if your blood sugar goes below 70, or if it stays over 200.

## 2011-12-19 ENCOUNTER — Emergency Department (HOSPITAL_COMMUNITY)
Admission: EM | Admit: 2011-12-19 | Discharge: 2011-12-19 | Disposition: A | Discharge status: Home / Self Care | Payer: BC Managed Care – PPO | Source: Home / Self Care | Attending: Emergency Medicine | Admitting: Emergency Medicine

## 2011-12-19 ENCOUNTER — Encounter (HOSPITAL_COMMUNITY): Payer: Self-pay | Admitting: Emergency Medicine

## 2011-12-19 DIAGNOSIS — K644 Residual hemorrhoidal skin tags: Secondary | ICD-10-CM

## 2011-12-19 DIAGNOSIS — K645 Perianal venous thrombosis: Secondary | ICD-10-CM

## 2011-12-19 MED ORDER — DOXYCYCLINE HYCLATE 100 MG PO CAPS: 100.0000 mg | ORAL_CAPSULE | Freq: Two times a day (BID) | ORAL | Status: DC

## 2011-12-19 MED ORDER — HYDROCODONE-ACETAMINOPHEN 5-500 MG PO TABS: 1.0000 | ORAL_TABLET | Freq: Four times a day (QID) | ORAL | Status: AC | PRN

## 2011-12-19 NOTE — ED Provider Notes (Addendum)
History     CSN: [id number]  Arrival date & time 12/19/11  1226   First MD Initiated Contact with Patient 12/19/11 1249      Chief Complaint  Patient presents with  . Cyst    (Consider location/radiation/quality/duration/timing/severity/associated sxs/prior treatment) HPI Comments: Images be notices a new abscess on his left perirectal area. It's been about 4 days and has been increasing in size and tenderness. Describes as area continues to be irritating him and he not too long ago had another infection in the same area. Denies any fevers, diarrheas or abdominal pain.  The history is provided by the patient.    Past Medical History  Diagnosis Date  . HYPOTHYROIDISM 09/19/2007  . DIABETES MELLITUS, TYPE II 09/19/2007  . HYPERLIPIDEMIA 09/19/2007  . GOUT 04/17/2010  . ANXIETY 09/19/2007  . ALCOHOL USE 10/24/2008  . SMOKER 04/17/2010  . HYPERTENSION 09/19/2007  . External thrombosed hemorrhoids 08/23/2008  . OTHER DISEASES OF LUNG NOT ELSEWHERE CLASSIFIED 07/20/2010  . SCROTAL ABSCESS 05/27/2010  . Sebaceous cyst 08/01/2008  . OSTEOARTHRITIS 09/19/2007  . SLEEP APNEA 08/22/2009  . Rash and other nonspecific skin eruption 09/19/2007  . Cough 05/22/2008    Past Surgical History  Procedure Date  . Nasal septum surgery     Family History  Problem Relation Age of Onset  . Breast cancer Mother     Breast Cancer    History  Substance Use Topics  . Smoking status: Current Everyday Smoker -- 0.5 packs/day  . Smokeless tobacco: Never Used  . Alcohol Use: Yes     pt says he needs to minimize      Review of Systems  Constitutional: Negative for fever and diaphoresis.  Gastrointestinal: Negative for abdominal pain and diarrhea.  Skin: Negative for rash.    Allergies  Review of patient's allergies indicates no known allergies.  Home Medications   Current Outpatient Rx  Name Route Sig Dispense Refill  . ALPRAZOLAM 1 MG PO TABS Oral Take 1 tablet (1 mg total) by mouth at bedtime as  needed for sleep. 30 tablet 2  . BD PEN NEEDLE ULTRAFINE 29G X 12.7MM MISC  USE TWICE A DAY AS DIRECTED 100 each 3  . DICLOFENAC SODIUM 75 MG PO TBEC  TAKE 1 TABLET TWICE A DAY AS NEEDED FOR PAIN 180 tablet 1  . DOXYCYCLINE HYCLATE 100 MG PO CAPS Oral Take 1 capsule (100 mg total) by mouth 2 (two) times daily. 20 capsule 0  . FENOFIBRATE MICRONIZED 134 MG PO CAPS  TAKE ONE CAPSULE BY MOUTH EVERY DAY 30 capsule 4  . GLUCOSE BLOOD VI STRP  Two times a day dx 250.01     . HYDROCODONE-ACETAMINOPHEN 5-500 MG PO TABS Oral Take 1-2 tablets by mouth every 6 (six) hours as needed for pain. 15 tablet 0  . INSULIN DETEMIR 100 UNIT/ML [place] SOLN Subcutaneous Inject 450 Units into the skin every morning. As directed    . INSULIN PEN NEEDLE 31G X 8 MM MISC  Use as directed two times a day    . LEVOTHYROXINE SODIUM 175 MCG PO TABS Oral Take 1 tablet (175 mcg total) by mouth daily. 30 tablet 11  . METOPROLOL TARTRATE 50 MG PO TABS  TAKE 1 TABLET BY MOUTH TWICE A DAY 60 tablet 4  . OMEPRAZOLE MAGNESIUM 20 MG PO TBEC Oral Take 20 mg by mouth daily as needed. For indigestion    . ADULT MASK MISC Does not apply 2 Devices by Does not apply  route once. 2 each 0    Mask of choice  . SIMVASTATIN 40 MG PO TABS Oral Take 20 mg by mouth at bedtime.       BP 170/100  Pulse 93  Temp(Src) 97.6 F (36.4 C) (Oral)  Resp 20  SpO2 95%  Physical Exam  Nursing note and vitals reviewed. Constitutional: No distress.  Skin:       ED Course  Thrombectomy Performed by: Luismiguel Lamere Authorized by: Jimmie Molly Consent: Verbal consent obtained. Written consent not obtained. Consent given by: patient Patient understanding: patient states understanding of the procedure being performed Patient identity confirmed: verbally with patient Local anesthesia used: yes Anesthesia: local infiltration Local anesthetic: lidocaine 1% with epinephrine Anesthetic total: 6 ml Comments: Social localized duration use 11 blade opened a 1 cm  linear laceration obtained frank moderate amount of pus followed subsequently with 2-3 big blood clots   (including critical care time)   Labs Reviewed  CULTURE, ROUTINE-ABSCESS   No results found.   1. Thrombosed hemorrhoids   2. External hemorrhoids       MDM  Patient presented to urgent care today with a recurrent localize infection which on exam and procedure turn out to be a thrombosed and infected external hemorrhoid. samples cultured and patient was started with antibiotic treatment.      Jimmie Molly, MD 12/19/11 1732  Jimmie Molly, MD 12/19/11 [phone]

## 2011-12-19 NOTE — Discharge Instructions (Signed)
Take his medications as prescribed change pad as needed we discuss other measures that could be prevented. You had a thrombosed external hemorrhoids with secondarily infected. We have to obtain a sample for cultures. Try to avoid pressure in the area or sitting for long periods of time. Follow up with your doctor return if any further concerns or changes.

## 2011-12-19 NOTE — ED Notes (Signed)
Abscess inner thigh, left was seen at [facility] long a month go, site i/d, packing.  Reports site improved and healed.  approx 3-4 days ago noticed fullness in same area, pain is worsening, no comfortable position "constant irritant"

## 2011-12-21 ENCOUNTER — Telehealth (HOSPITAL_COMMUNITY): Payer: Self-pay | Admitting: *Deleted

## 2011-12-21 LAB — CULTURE, ROUTINE-ABSCESS

## 2011-12-21 NOTE — ED Notes (Signed)
Abscess perineal region: Abundant E. Coli.  Pt. treated with Doxycycline- not on sensitivity report.  Lab shown to Dr. Tressia Danas and she said to call for clinical improvement. If not improved change antibiotic to Cipro 500 mg. BID x 7 days # 14. I called and left message to call. Johnny Andrews 12/21/2011

## 2011-12-23 ENCOUNTER — Telehealth (HOSPITAL_COMMUNITY): Payer: Self-pay | Admitting: *Deleted

## 2011-12-23 NOTE — ED Notes (Signed)
I called pt. and he said it was a little better but sounded anxious about E. Coli. I explained that the I and D usually will clear it up but the antibiotic should make it completely go away. It should not come back. Pt. wants to switch to the other antibiotic and wants it called to [facility] on [address]  Rx. called to pharmacist @ [phone]. Vassie Moselle 12/23/2011

## 2012-01-13 ENCOUNTER — Other Ambulatory Visit: Payer: Self-pay | Admitting: Endocrinology

## 2012-02-17 ENCOUNTER — Other Ambulatory Visit: Payer: Self-pay | Admitting: Endocrinology

## 2012-03-13 ENCOUNTER — Other Ambulatory Visit: Payer: Self-pay | Admitting: *Deleted

## 2012-03-13 MED ORDER — ALPRAZOLAM 1 MG PO TABS: 1.0000 mg | ORAL_TABLET | Freq: Every evening | ORAL | Status: DC | PRN

## 2012-03-13 NOTE — Telephone Encounter (Signed)
Rx faxed to [facility], pt informed to schedule F/U OV with MD. Pt states that he is having some financial difficulties and will try to schedule an OV when he can.

## 2012-03-13 NOTE — Telephone Encounter (Signed)
R'cd fax from [facility] for refill of Alprazolam-last written 11/30/2011 #30 with 2 refills-please advise.

## 2012-03-13 NOTE — Telephone Encounter (Signed)
i printed Ov is due 

## 2012-04-11 ENCOUNTER — Other Ambulatory Visit: Payer: Self-pay | Admitting: Endocrinology

## 2012-04-19 ENCOUNTER — Other Ambulatory Visit: Payer: Self-pay | Admitting: *Deleted

## 2012-04-19 NOTE — Telephone Encounter (Signed)
R'cd fax from [facility] for refill of Alprazolam last written 03/13/2012 #30 with 0 refills-please advise.

## 2012-04-20 MED ORDER — ALPRAZOLAM 1 MG PO TABS: 1.0000 mg | ORAL_TABLET | Freq: Every evening | ORAL | Status: AC | PRN

## 2012-04-20 NOTE — Telephone Encounter (Signed)
Rx faxed to [facility]. Pt informed no additional refills without OV.

## 2012-04-20 NOTE — Telephone Encounter (Signed)
i printed No more refills until ov

## 2012-05-29 ENCOUNTER — Other Ambulatory Visit: Payer: Self-pay | Admitting: Endocrinology

## 2012-05-29 ENCOUNTER — Other Ambulatory Visit: Payer: Self-pay | Admitting: General Practice

## 2012-05-29 DIAGNOSIS — E039 Hypothyroidism, unspecified: Secondary | ICD-10-CM

## 2012-05-29 DIAGNOSIS — E119 Type 2 diabetes mellitus without complications: Secondary | ICD-10-CM

## 2012-05-29 NOTE — Telephone Encounter (Signed)
Please refill x 1  

## 2012-05-29 NOTE — Telephone Encounter (Signed)
Pt called to request refill of XANAX, med last filled on 04/20/2012. Pt made an appt for 06/23/12. Ok to refill?

## 2012-05-30 NOTE — Telephone Encounter (Signed)
rxfaxed 05/29/12

## 2012-06-11 ENCOUNTER — Other Ambulatory Visit: Payer: Self-pay | Admitting: Endocrinology

## 2012-06-21 ENCOUNTER — Other Ambulatory Visit (INDEPENDENT_AMBULATORY_CARE_PROVIDER_SITE_OTHER): Payer: BC Managed Care – PPO

## 2012-06-21 DIAGNOSIS — E039 Hypothyroidism, unspecified: Secondary | ICD-10-CM

## 2012-06-21 DIAGNOSIS — E119 Type 2 diabetes mellitus without complications: Secondary | ICD-10-CM

## 2012-06-23 ENCOUNTER — Ambulatory Visit (INDEPENDENT_AMBULATORY_CARE_PROVIDER_SITE_OTHER): Payer: BC Managed Care – PPO | Admitting: Endocrinology

## 2012-06-23 VITALS — BP 160/82 | HR 84 | Temp 97.6°F | Wt 346.0 lb

## 2012-06-23 DIAGNOSIS — E119 Type 2 diabetes mellitus without complications: Secondary | ICD-10-CM

## 2012-06-23 DIAGNOSIS — Z23 Encounter for immunization: Secondary | ICD-10-CM

## 2012-06-23 MED ORDER — LEVOTHYROXINE SODIUM 200 MCG PO TABS: 200.0000 ug | ORAL_TABLET | Freq: Every day | ORAL | Status: DC

## 2012-06-23 MED ORDER — TRAZODONE HCL 150 MG PO TABS: 150.0000 mg | ORAL_TABLET | Freq: Every day | ORAL | Status: DC

## 2012-06-23 MED ORDER — INSULIN GLARGINE 100 UNIT/ML ~~LOC~~ SOLN: 200.0000 [IU] | Freq: Every morning | SUBCUTANEOUS | Status: DC

## 2012-06-23 NOTE — Progress Notes (Signed)
Subjective:    Patient ID: Johnny Andrews, male    DOB: 1961/05/01, 51 y.o.   MRN: [id number]  HPI The state of at least three ongoing medical problems is addressed today, with interval history of each noted here: Pt returns for f/u of insulin-requiring DM (dx'ed 1997; no known complications; characterized by severe insulin resistance; he needs a simple qd insulin regimen; therapy has been limited by noncompliance with f/u ov's and cbg recording, being chronically late for appts, alcoholism, morbid obesity, and intermittent crash dieting). no cbg record, but states cbg's are persistently high.  He says he never misses his insulin.   Insomnia: he wishes to continue xanax, but he continue to consume alcohol. Hypothyroidism: denies weight change Past Medical History  Diagnosis Date  . HYPOTHYROIDISM 09/19/2007  . DIABETES MELLITUS, TYPE II 09/19/2007  . HYPERLIPIDEMIA 09/19/2007  . GOUT 04/17/2010  . ANXIETY 09/19/2007  . ALCOHOL USE 10/24/2008  . SMOKER 04/17/2010  . HYPERTENSION 09/19/2007  . External thrombosed hemorrhoids 08/23/2008  . OTHER DISEASES OF LUNG NOT ELSEWHERE CLASSIFIED 07/20/2010  . SCROTAL ABSCESS 05/27/2010  . Cyst of skin and subcutaneous tissue 08/01/2008  . OSTEOARTHRITIS 09/19/2007  . SLEEP APNEA 08/22/2009  . Rash and other nonspecific skin eruption 09/19/2007  . Cough 05/22/2008    Past Surgical History  Procedure Date  . Nasal septum surgery     History   Social History  . Marital Status: Married    Spouse Name: N/A    Number of Children: N/A  . Years of Education: N/A   Occupational History  . [employer]   Social History Main Topics  . Smoking status: Current Every Day Smoker -- 0.5 packs/day  . Smokeless tobacco: Never Used  . Alcohol Use: Yes     Comment: pt says he needs to minimize  . Drug Use: No  . Sexually Active:    Other Topics Concern  . Not on file   Social History Narrative   Regular exercise-no    Current Outpatient Prescriptions on File  Prior to Visit  Medication Sig Dispense Refill  . BD ULTRA-FINE PEN NEEDLES 29G X 12.7MM MISC USE TWICE A DAY AS DIRECTED  100 each  3  . diclofenac (VOLTAREN) 75 MG EC tablet TAKE 1 TABLET TWICE A DAY AS NEEDED FOR PAIN  180 tablet  0  . fenofibrate micronized (LOFIBRA) 134 MG capsule TAKE ONE CAPSULE BY MOUTH EVERY DAY  30 capsule  7  . glucose blood (ONE TOUCH ULTRA TEST) test strip Two times a day dx 250.01       . Insulin Pen Needle (B-D ULTRAFINE III SHORT PEN) 31G X 8 MM MISC Use as directed two times a day      . metoprolol (LOPRESSOR) 50 MG tablet TAKE 1 TABLET BY MOUTH TWICE A DAY  60 tablet  7  . omeprazole (PRILOSEC OTC) 20 MG tablet Take 20 mg by mouth daily as needed. For indigestion      . Respiratory Therapy Supplies (ADULT MASK) MISC 2 Devices by Does not apply route once.  2 each  0  . simvastatin (ZOCOR) 40 MG tablet TAKE 1/2 TABLET AT BEDTIME  30 tablet  5  . insulin glargine (LANTUS SOLOSTAR) 100 UNIT/ML injection Inject 200 Units into the skin every morning.  75 mL  PRN  . traZODone (DESYREL) 150 MG tablet Take 1 tablet (150 mg total) by mouth at bedtime.  30 tablet  3    No Known Allergies  Family History  Problem Relation Age of Onset  . Breast cancer Mother     Breast Cancer    BP 160/82  Pulse 84  Temp 97.6 F (36.4 C) (Oral)  Wt 346 lb (156.945 kg)  SpO2 98%  Review of Systems denies hypoglycemia and edema.      Objective:   Physical Exam VITAL SIGNS:  See vs page GENERAL: no distress.  Morbid obesity. Pulses: dorsalis pedis intact bilat.   Feet: no deformity.  no ulcer on the feet.  feet are of normal color and temp.  no edema Neuro: sensation is intact to touch on the feet, but decreased from normal   Lab Results  Component Value Date   HGBA1C 11.5* 06/21/2012   Lab Results  Component Value Date   TSH 6.43* 06/21/2012      Assessment & Plan:  DM: poor control for the reasons noted in HPI. Hypothyroidism, needs increased rx Alcoholism:  this is a contraindication to xanax

## 2012-06-23 NOTE — Patient Instructions (Addendum)
Please increase the levothyroxine.  i have sent a prescription to your pharmacy. Please change levemir to lantus. i have sent a prescription to your pharmacy. Please change alprazolam to trazodone.  i have sent a prescription to your pharmacy.   Please come back for a follow-up appointment for 1 month.

## 2012-08-22 ENCOUNTER — Other Ambulatory Visit: Payer: Self-pay | Admitting: *Deleted

## 2012-08-22 MED ORDER — DICLOFENAC SODIUM 75 MG PO TBEC: 75.0000 mg | DELAYED_RELEASE_TABLET | Freq: Two times a day (BID) | ORAL | Status: DC

## 2012-08-28 ENCOUNTER — Ambulatory Visit (INDEPENDENT_AMBULATORY_CARE_PROVIDER_SITE_OTHER): Payer: BC Managed Care – PPO | Admitting: Endocrinology

## 2012-08-28 VITALS — BP 140/74 | HR 93 | Temp 97.8°F | Wt 353.0 lb

## 2012-08-28 DIAGNOSIS — E119 Type 2 diabetes mellitus without complications: Secondary | ICD-10-CM

## 2012-08-28 DIAGNOSIS — M545 Low back pain: Secondary | ICD-10-CM

## 2012-08-28 LAB — BASIC METABOLIC PANEL
Calcium: 9.3 mg/dL (ref 8.4–10.5)
GFR: 134.95 mL/min (ref >60.00)
Glucose, Bld: 286 mg/dL — ABNORMAL HIGH (ref 70–99)
Potassium: 4.4 mEq/L (ref 3.5–5.1)
Sodium: 133 mEq/L — ABNORMAL LOW (ref 135–145)

## 2012-08-28 LAB — SEDIMENTATION RATE: Sed Rate: 11 mm/hr (ref 0–22)

## 2012-08-28 MED ORDER — TRAMADOL HCL 50 MG PO TABS: 50.0000 mg | ORAL_TABLET | Freq: Four times a day (QID) | ORAL | Status: DC | PRN

## 2012-08-28 NOTE — Patient Instructions (Addendum)
Let's recheck the MRI.  you will receive a phone call, about a day and time for an appointment.   blood tests are being requested for you today.  We'll contact you with results.  Change trazodone to diphenhydramine 25-50 mg at bedtime as needed for sleep.   Please come back for a follow-up appointment in 3 months.     Insomnia Insomnia is frequent trouble falling and/or staying asleep. Insomnia can be a long term problem or a short term problem. Both are common. Insomnia can be a short term problem when the wakefulness is related to a certain stress or worry. Long term insomnia is often related to ongoing stress during waking hours and/or poor sleeping habits. Overtime, sleep deprivation itself can make the problem worse. Every little thing feels more severe because you are overtired and your ability to cope is decreased. CAUSES    Stress, anxiety, and depression.   Poor sleeping habits.   Distractions such as TV in the bedroom.   Naps close to bedtime.   Engaging in emotionally charged conversations before bed.   Technical reading before sleep.   Alcohol and other sedatives. They may make the problem worse. They can hurt normal sleep patterns and normal dream activity.   Stimulants such as caffeine for several hours prior to bedtime.   Pain syndromes and shortness of breath can cause insomnia.   Exercise late at night.   Changing time zones may cause sleeping problems (jet lag).  It is sometimes helpful to have someone observe your sleeping patterns. They should look for periods of not breathing during the night (sleep apnea). They should also look to see how long those periods last. If you live alone or observers are uncertain, you can also be observed at a sleep clinic where your sleep patterns will be professionally monitored. Sleep apnea requires a checkup and treatment. Give your caregivers your medical history. Give your caregivers observations your family has made about your  sleep.   SYMPTOMS    Not feeling rested in the morning.   Anxiety and restlessness at bedtime.   Difficulty falling and staying asleep.  TREATMENT    Your caregiver may prescribe treatment for an underlying medical disorders. Your caregiver can give advice or help if you are using alcohol or other drugs for self-medication. Treatment of underlying problems will usually eliminate insomnia problems.   Medications can be prescribed for short time use. They are generally not recommended for lengthy use.   Over-the-counter sleep medicines are not recommended for lengthy use. They can be habit forming.   You can promote easier sleeping by making lifestyle changes such as:   Using relaxation techniques that help with breathing and reduce muscle tension.   Exercising earlier in the day.   Changing your diet and the time of your last meal. No night time snacks.   Establish a regular time to go to bed.   Counseling can help with stressful problems and worry.   Soothing music and white noise may be helpful if there are background noises you cannot remove.   Stop tedious detailed work at least one hour before bedtime.  HOME CARE INSTRUCTIONS    Keep a diary. Inform your caregiver about your progress. This includes any medication side effects. See your caregiver regularly. Take note of:   Times when you are asleep.   Times when you are awake during the night.   The quality of your sleep.   How you feel the next day.  This information will help your caregiver care for you.  Get out of bed if you are still awake after 15 minutes. Read or do some quiet activity. Keep the lights down. Wait until you feel sleepy and go back to bed.   Keep regular sleeping and waking hours. Avoid naps.   Exercise regularly.   Avoid distractions at bedtime. Distractions include watching television or engaging in any intense or detailed activity like attempting to balance the household checkbook.    Develop a bedtime ritual. Keep a familiar routine of bathing, brushing your teeth, climbing into bed at the same time each night, listening to soothing music. Routines increase the success of falling to sleep faster.   Use relaxation techniques. This can be using breathing and muscle tension release routines. It can also include visualizing peaceful scenes. You can also help control troubling or intruding thoughts by keeping your mind occupied with boring or repetitive thoughts like the old concept of counting sheep. You can make it more creative like imagining planting one beautiful flower after another in your backyard garden.   During your day, work to eliminate stress. When this is not possible use some of the previous suggestions to help reduce the anxiety that accompanies stressful situations.  MAKE SURE YOU:    Understand these instructions.   Will watch your condition.   Will get help right away if you are not doing well or get worse.  Document Released: 07/30/2000 Document Revised: 10/25/2011 Document Reviewed: 08/30/2007 [facility] Patient Information 2013 [place], [place].

## 2012-08-28 NOTE — Progress Notes (Signed)
Subjective:    Patient ID: Johnny Andrews, male    DOB: [birth year], 52 y.o.   MRN: [id number]  HPI Pt returns for f/u of insulin-requiring DM (dx'ed 1997; no known complications; characterized by severe insulin resistance; he needs a simple qd insulin regimen; therapy has been chronically limited by noncompliance with f/u ov's and cbg recording, being late for appts, alcoholism, morbid obesity, and intermittent crash dieting). no cbg record, but states cbg's are in the mid-100's.  He says he never misses his insulin.  denies hypoglycemia.   Pt states 1 week of moderate pain at the lower back, with assoc pain at the right buttock.  pain is worse with sitting or lying supine.  No assoc numbness.  No help with voltaren, even at a double dosage.  He says desyrel does not help insomnia, and causes dizziness.  He may consume 3-5 beers in the evening.   Past Medical History  Diagnosis Date  . HYPOTHYROIDISM 09/19/2007  . DIABETES MELLITUS, TYPE II 09/19/2007  . HYPERLIPIDEMIA 09/19/2007  . GOUT 04/17/2010  . ANXIETY 09/19/2007  . ALCOHOL USE 10/24/2008  . SMOKER 04/17/2010  . HYPERTENSION 09/19/2007  . External thrombosed hemorrhoids 08/23/2008  . OTHER DISEASES OF LUNG NOT ELSEWHERE CLASSIFIED 07/20/2010  . SCROTAL ABSCESS 05/27/2010  . Cyst of skin and subcutaneous tissue 08/01/2008  . OSTEOARTHRITIS 09/19/2007  . SLEEP APNEA 08/22/2009  . Rash and other nonspecific skin eruption 09/19/2007  . Cough 05/22/2008    Past Surgical History  Procedure Date  . Nasal septum surgery     History   Social History  . Marital Status: Married    Spouse Name: N/A    Number of Children: N/A  . Years of Education: N/A   Occupational History  . [employer]   Social History Main Topics  . Smoking status: Current Every Day Smoker -- 0.5 packs/day  . Smokeless tobacco: Never Used  . Alcohol Use: Yes     Comment: pt says he needs to minimize  . Drug Use: No  . Sexually Active:    Other Topics Concern  . Not on  file   Social History Narrative   Regular exercise-no    Current Outpatient Prescriptions on File Prior to Visit  Medication Sig Dispense Refill  . BD ULTRA-FINE PEN NEEDLES 29G X 12.7MM MISC USE TWICE A DAY AS DIRECTED  100 each  3  . diclofenac (VOLTAREN) 75 MG EC tablet Take 1 tablet (75 mg total) by mouth 2 (two) times daily. As needed for pain  180 tablet  0  . diphenhydrAMINE (BENADRYL) 50 MG capsule Take 50 mg by mouth at bedtime as needed.      . fenofibrate micronized (LOFIBRA) 134 MG capsule TAKE ONE CAPSULE BY MOUTH EVERY DAY  30 capsule  7  . glucose blood (ONE TOUCH ULTRA TEST) test strip Two times a day dx 250.01       . Insulin Pen Needle (B-D ULTRAFINE III SHORT PEN) 31G X 8 MM MISC Use as directed two times a day      . levothyroxine (SYNTHROID, LEVOTHROID) 200 MCG tablet Take 1 tablet (200 mcg total) by mouth daily.  30 tablet  11  . metoprolol (LOPRESSOR) 50 MG tablet TAKE 1 TABLET BY MOUTH TWICE A DAY  60 tablet  7  . omeprazole (PRILOSEC OTC) 20 MG tablet Take 20 mg by mouth daily as needed. For indigestion      . Respiratory Therapy Supplies (ADULT MASK) MISC  2 Devices by Does not apply route once.  2 each  0  . simvastatin (ZOCOR) 40 MG tablet TAKE 1/2 TABLET AT BEDTIME  30 tablet  5  . insulin glargine (LANTUS) 100 UNIT/ML injection Inject 250 Units into the skin every morning.  10 mL  6    No Known Allergies  Family History  Problem Relation Age of Onset  . Breast cancer Mother     Breast Cancer    BP 140/74  Pulse 93  Temp 97.8 F (36.6 C) (Oral)  Wt 353 lb (160.12 kg)  SpO2 96%   Review of Systems Denies bowel and bladder retention.      Objective:   Physical Exam VITAL SIGNS:  See vs page.   GENERAL: no distress.   Spine: nontender.   Gait: normal and steady. Neuro: sensation is intact to touch on the lower extremities.    Lab Results  Component Value Date   HGBA1C 11.4* 08/28/2012      Assessment & Plan:  Low-back ain, recurrent,  uncertain etiology DM, therapy limited by noncompliance.  i'll do the best i can.  needs increased rx HTN, possibly exac by pain Insomnia, exac by alcoholism.

## 2012-08-29 LAB — HEMOGLOBIN A1C: Hgb A1c MFr Bld: 11.4 % — ABNORMAL HIGH (ref 4.6–6.5)

## 2012-08-30 ENCOUNTER — Other Ambulatory Visit: Payer: Self-pay

## 2012-08-30 MED ORDER — INSULIN GLARGINE 100 UNIT/ML ~~LOC~~ SOLN: 250.0000 [IU] | Freq: Every morning | SUBCUTANEOUS | Status: DC

## 2012-09-01 ENCOUNTER — Telehealth: Payer: Self-pay

## 2012-09-01 ENCOUNTER — Telehealth: Payer: Self-pay | Admitting: Endocrinology

## 2012-09-01 DIAGNOSIS — M545 Low back pain: Secondary | ICD-10-CM

## 2012-09-01 NOTE — Telephone Encounter (Signed)
Johnny Andrews, [facility], please change pt's order for with and without in order for ins. pay

## 2012-09-01 NOTE — Telephone Encounter (Signed)
Please refer this call to [facility] 

## 2012-09-01 NOTE — Telephone Encounter (Signed)
The patient called to request that his referral for an MRI be sent to [facility], whose phone number is [phone].  The patient requests return call to [phone] to let him know this has been handled.  The patient requests referral appointment be made after the 1st of February.

## 2012-09-01 NOTE — Telephone Encounter (Signed)
i reordered 

## 2012-09-05 NOTE — Telephone Encounter (Signed)
Please refer this call to [facility]

## 2012-09-05 NOTE — Telephone Encounter (Signed)
Patient last seen August of 12. Dr. Everardo All ordered the MRI presumably based on exam findings. Will ask [facility] to change the location but the order is Dr. George Hugh. I will need to see the patient if I am expected to order the test.

## 2012-09-07 ENCOUNTER — Telehealth: Payer: Self-pay | Admitting: *Deleted

## 2012-09-12 ENCOUNTER — Telehealth: Payer: Self-pay

## 2012-09-12 MED ORDER — INSULIN GLARGINE 100 UNIT/ML ~~LOC~~ SOLN: 250.0000 [IU] | SUBCUTANEOUS | Status: DC

## 2012-09-12 NOTE — Telephone Encounter (Signed)
Pt advised and states an understanding 

## 2012-09-12 NOTE — Telephone Encounter (Signed)
Pt called stating that his rx should have been for lantus pens, not vials also dosage was incorrect on rx, please resend

## 2012-09-12 NOTE — Telephone Encounter (Signed)
i have sent a prescription to your pharmacy, for pens Please increase to 250 units qam

## 2012-09-14 NOTE — Telephone Encounter (Signed)
Encounter opened in error

## 2012-09-18 ENCOUNTER — Other Ambulatory Visit: Payer: BC Managed Care – PPO

## 2012-10-26 ENCOUNTER — Telehealth: Payer: Self-pay | Admitting: *Deleted

## 2012-10-26 NOTE — Telephone Encounter (Signed)
Left msg on triage stating needing to confirm has pt seen md last 6 months. Also need confirm fax #. Called back spoke with rep gave her Dr. Everardo All new fax # & office #...Raechel Chute

## 2012-11-10 ENCOUNTER — Telehealth: Payer: Self-pay | Admitting: Endocrinology

## 2012-11-10 NOTE — Telephone Encounter (Signed)
Called the patient, he stated he "was in the middle of something" and would call back when he was ready to schedule a follow up appointment.

## 2012-11-13 ENCOUNTER — Other Ambulatory Visit: Payer: Self-pay | Admitting: *Deleted

## 2012-11-13 MED ORDER — DICLOFENAC SODIUM 75 MG PO TBEC: 75.0000 mg | DELAYED_RELEASE_TABLET | Freq: Two times a day (BID) | ORAL | Status: DC

## 2012-12-08 ENCOUNTER — Other Ambulatory Visit: Payer: Self-pay

## 2012-12-08 MED ORDER — FENOFIBRATE MICRONIZED 134 MG PO CAPS: ORAL_CAPSULE | ORAL | Status: DC

## 2012-12-11 ENCOUNTER — Other Ambulatory Visit: Payer: Self-pay | Admitting: *Deleted

## 2012-12-11 MED ORDER — INSULIN DETEMIR 100 UNIT/ML ~~LOC~~ SOLN: SUBCUTANEOUS | Status: DC

## 2013-01-09 ENCOUNTER — Other Ambulatory Visit: Payer: Self-pay

## 2013-01-09 MED ORDER — METOPROLOL TARTRATE 50 MG PO TABS: ORAL_TABLET | ORAL | Status: DC

## 2013-02-05 ENCOUNTER — Ambulatory Visit
Admission: RE | Admit: 2013-02-05 | Discharge: 2013-02-05 | Disposition: A | Discharge status: Home / Self Care | Payer: BC Managed Care – PPO | Source: Ambulatory Visit | Attending: Endocrinology | Admitting: Endocrinology

## 2013-02-05 ENCOUNTER — Ambulatory Visit (INDEPENDENT_AMBULATORY_CARE_PROVIDER_SITE_OTHER): Payer: BC Managed Care – PPO | Admitting: Endocrinology

## 2013-02-05 ENCOUNTER — Encounter: Payer: Self-pay | Admitting: Endocrinology

## 2013-02-05 ENCOUNTER — Other Ambulatory Visit: Payer: Self-pay | Admitting: *Deleted

## 2013-02-05 VITALS — BP 142/88 | HR 83 | Temp 98.8°F | Resp 12 | Ht 75.0 in | Wt 365.0 lb

## 2013-02-05 DIAGNOSIS — R05 Cough: Secondary | ICD-10-CM

## 2013-02-05 DIAGNOSIS — R059 Cough, unspecified: Secondary | ICD-10-CM

## 2013-02-05 DIAGNOSIS — I10 Essential (primary) hypertension: Secondary | ICD-10-CM

## 2013-02-05 DIAGNOSIS — E785 Hyperlipidemia, unspecified: Secondary | ICD-10-CM

## 2013-02-05 DIAGNOSIS — E291 Testicular hypofunction: Secondary | ICD-10-CM

## 2013-02-05 DIAGNOSIS — E039 Hypothyroidism, unspecified: Secondary | ICD-10-CM

## 2013-02-05 DIAGNOSIS — N529 Male erectile dysfunction, unspecified: Secondary | ICD-10-CM

## 2013-02-05 DIAGNOSIS — M109 Gout, unspecified: Secondary | ICD-10-CM

## 2013-02-05 DIAGNOSIS — Z79899 Other long term (current) drug therapy: Secondary | ICD-10-CM

## 2013-02-05 DIAGNOSIS — E119 Type 2 diabetes mellitus without complications: Secondary | ICD-10-CM

## 2013-02-05 DIAGNOSIS — R972 Elevated prostate specific antigen [PSA]: Secondary | ICD-10-CM

## 2013-02-05 LAB — URINALYSIS, ROUTINE W REFLEX MICROSCOPIC
Bilirubin Urine: NEGATIVE
Ketones, ur: NEGATIVE
Specific Gravity, Urine: >=1.03 (ref 1.000–1.030)
Urine Glucose: >=1000
pH: 6 (ref 5.0–8.0)

## 2013-02-05 LAB — BASIC METABOLIC PANEL
CO2: 28 mEq/L (ref 19–32)
Chloride: 99 mEq/L (ref 96–112)
GFR: 134.72 mL/min (ref >60.00)
Glucose, Bld: 297 mg/dL — ABNORMAL HIGH (ref 70–99)
Potassium: 4.2 mEq/L (ref 3.5–5.1)
Sodium: 135 mEq/L (ref 135–145)

## 2013-02-05 LAB — CBC WITH DIFFERENTIAL/PLATELET
Basophils Relative: 1.1 % (ref 0.0–3.0)
HCT: 43.2 % (ref 39.0–52.0)
Hemoglobin: 14.8 g/dL (ref 13.0–17.0)
Lymphocytes Relative: 29.4 % (ref 12.0–46.0)
Lymphs Abs: 2.4 10*3/uL (ref 0.7–4.0)
MCHC: 34.2 g/dL (ref 30.0–36.0)
Monocytes Relative: 7.7 % (ref 3.0–12.0)
Neutro Abs: 4.7 10*3/uL (ref 1.4–7.7)
RBC: 4.93 Mil/uL (ref 4.22–5.81)

## 2013-02-05 LAB — HEPATIC FUNCTION PANEL
AST: 28 U/L (ref 0–37)
Albumin: 3.6 g/dL (ref 3.5–5.2)
Alkaline Phosphatase: 81 U/L (ref 39–117)
Total Bilirubin: 0.6 mg/dL (ref 0.3–1.2)

## 2013-02-05 LAB — LIPID PANEL
Cholesterol: 223 mg/dL — ABNORMAL HIGH (ref 0–200)
HDL: 39.2 mg/dL (ref >39.00)
Triglycerides: 809 mg/dL — ABNORMAL HIGH (ref 0.0–149.0)

## 2013-02-05 LAB — HEMOGLOBIN A1C: Hgb A1c MFr Bld: 11.2 % — ABNORMAL HIGH (ref 4.6–6.5)

## 2013-02-05 LAB — PSA: PSA: 2.94 ng/mL (ref 0.10–4.00)

## 2013-02-05 MED ORDER — INSULIN GLARGINE 100 UNIT/ML SOLOSTAR PEN: 300.0000 [IU] | PEN_INJECTOR | Freq: Every day | SUBCUTANEOUS | Status: DC

## 2013-02-05 MED ORDER — SIMVASTATIN 40 MG PO TABS: ORAL_TABLET | ORAL | Status: DC

## 2013-02-05 NOTE — Patient Instructions (Addendum)
blood tests are being requested for you today.  We'll contact you with results. Please come in soon for a regular physical.   

## 2013-02-05 NOTE — Progress Notes (Signed)
Subjective:    Patient ID: Johnny Andrews, male    DOB: 10-25-1960, 52 y.o.   MRN: [id number]  HPI Pt returns for f/u of insulin-requiring DM (dx'ed 1997; He has mild if any neuropathy of the lower extremities.  no known associated complications; characterized by severe insulin resistance; he needs a simple qd insulin regimen; therapy has been chronically limited by noncompliance with f/u ov's and cbg recording, being late for appts, alcoholism, morbid obesity, and intermittent crash dieting). He says he never misses his insulin.  He denies hypoglycemia.  He has quit smoking.  He takes lantus, 250 units qam.  this was changed a few mos ago, apparently due to insurance demand.  no cbg record, but states cbg's are 160-320.   Past Medical History  Diagnosis Date  . HYPOTHYROIDISM 09/19/2007  . DIABETES MELLITUS, TYPE II 09/19/2007  . HYPERLIPIDEMIA 09/19/2007  . GOUT 04/17/2010  . ANXIETY 09/19/2007  . ALCOHOL USE 10/24/2008  . SMOKER 04/17/2010  . HYPERTENSION 09/19/2007  . External thrombosed hemorrhoids 08/23/2008  . OTHER DISEASES OF LUNG NOT ELSEWHERE CLASSIFIED 07/20/2010  . SCROTAL ABSCESS 05/27/2010  . Sebaceous cyst 08/01/2008  . OSTEOARTHRITIS 09/19/2007  . SLEEP APNEA 08/22/2009  . Rash and other nonspecific skin eruption 09/19/2007  . Cough 05/22/2008    Past Surgical History  Procedure Laterality Date  . Nasal septum surgery      History   Social History  . Marital Status: Married    Spouse Name: N/A    Number of Children: N/A  . Years of Education: N/A   Occupational History  . [employer]   Social History Main Topics  . Smoking status: Former Smoker -- 0.50 packs/day    Quit date: 11/05/2012  . Smokeless tobacco: Never Used  . Alcohol Use: Yes     Comment: pt says he needs to minimize  . Drug Use: No  . Sexually Active: Not on file   Other Topics Concern  . Not on file   Social History Narrative   Regular exercise-no    Current Outpatient Prescriptions on File Prior to  Visit  Medication Sig Dispense Refill  . BD ULTRA-FINE PEN NEEDLES 29G X 12.7MM MISC USE TWICE A DAY AS DIRECTED  100 each  3  . diclofenac (VOLTAREN) 75 MG EC tablet Take 1 tablet (75 mg total) by mouth 2 (two) times daily. As needed for pain  180 tablet  0  . diphenhydrAMINE (BENADRYL) 50 MG capsule Take 50 mg by mouth at bedtime as needed.      . fenofibrate micronized (LOFIBRA) 134 MG capsule TAKE ONE CAPSULE BY MOUTH EVERY DAY  30 capsule  7  . glucose blood (ONE TOUCH ULTRA TEST) test strip Two times a day dx 250.01       . Insulin Pen Needle (B-D ULTRAFINE III SHORT PEN) 31G X 8 MM MISC Use as directed two times a day      . levothyroxine (SYNTHROID, LEVOTHROID) 200 MCG tablet Take 1 tablet (200 mcg total) by mouth daily.  30 tablet  11  . metoprolol (LOPRESSOR) 50 MG tablet TAKE 1 TABLET BY MOUTH TWICE A DAY  60 tablet  7  . omeprazole (PRILOSEC OTC) 20 MG tablet Take 20 mg by mouth daily as needed. For indigestion      . Respiratory Therapy Supplies (ADULT MASK) MISC 2 Devices by Does not apply route once.  2 each  0  . traMADol (ULTRAM) 50 MG tablet Take 1 tablet (  50 mg total) by mouth every 6 (six) hours as needed for pain.  50 tablet  3   No current facility-administered medications on file prior to visit.    No Known Allergies  Family History  Problem Relation Age of Onset  . Breast cancer Mother     Breast Cancer    BP 142/88  Pulse 83  Temp(Src) 98.8 F (37.1 C) (Oral)  Resp 12  Ht 6\' 3"  (1.905 m)  Wt 365 lb (165.563 kg)  BMI 45.62 kg/m2  SpO2 96%  Review of Systems He has weight gain and LOC.  He has a slight cough    Objective:   Physical Exam VITAL SIGNS:  See vs page GENERAL: no distress  Lab Results  Component Value Date   WBC 8.0 02/05/2013   HGB 14.8 02/05/2013   HCT 43.2 02/05/2013   PLT 216.0 02/05/2013   GLUCOSE 297* 02/05/2013   CHOL 223* 02/05/2013   TRIG 809.0* 02/05/2013   HDL 39.20 02/05/2013   LDLDIRECT 88.3 02/05/2013   ALT 30 02/05/2013    AST 28 02/05/2013   NA 135 02/05/2013   K 4.2 02/05/2013   CL 99 02/05/2013   CREATININE 0.7 02/05/2013   BUN 12 02/05/2013   CO2 28 02/05/2013   TSH 7.50* 02/05/2013   PSA 2.94 02/05/2013   HGBA1C 11.2* 02/05/2013   MICROALBUR 96.5* 02/05/2013   Lab Results  Component Value Date   TESTOSTERONE 150.32* 02/05/2013      Assessment & Plan:  DM: therapy limited by noncompliance.  i'll do the best i can.  He needs increased rx. Hypogonadism: central, idiopathic. Hypothyroidism.  tsh is close enough to normal that he can continue same rx for now.

## 2013-02-06 ENCOUNTER — Ambulatory Visit: Payer: BC Managed Care – PPO

## 2013-02-06 DIAGNOSIS — Z79899 Other long term (current) drug therapy: Secondary | ICD-10-CM

## 2013-02-06 LAB — PROLACTIN: Prolactin: 3.9 ng/mL (ref 2.1–17.1)

## 2013-02-06 LAB — LUTEINIZING HORMONE: LH: 2.78 m[IU]/mL (ref 1.50–9.30)

## 2013-02-07 ENCOUNTER — Other Ambulatory Visit: Payer: Self-pay | Admitting: Endocrinology

## 2013-02-07 MED ORDER — CLOMIPHENE CITRATE 50 MG PO TABS: ORAL_TABLET | ORAL | Status: DC

## 2013-02-20 ENCOUNTER — Telehealth: Payer: Self-pay

## 2013-02-20 NOTE — Telephone Encounter (Signed)
Dr Everardo All sent Clomiphene, not testosterone (see lab results note) >> please advise him that this is a po med to increase testosterone, he can go to pick it up and start.

## 2013-02-20 NOTE — Telephone Encounter (Signed)
Pt called stating he did not receive a rx for testosterone, based on his lab results [address]

## 2013-02-21 NOTE — Telephone Encounter (Signed)
Pt advised and states an understanding 

## 2013-04-13 ENCOUNTER — Other Ambulatory Visit: Payer: Self-pay

## 2013-04-13 MED ORDER — DICLOFENAC SODIUM 75 MG PO TBEC: 75.0000 mg | DELAYED_RELEASE_TABLET | Freq: Two times a day (BID) | ORAL | Status: DC

## 2013-05-09 ENCOUNTER — Other Ambulatory Visit: Payer: Self-pay | Admitting: *Deleted

## 2013-05-09 ENCOUNTER — Other Ambulatory Visit: Payer: Self-pay

## 2013-05-11 ENCOUNTER — Other Ambulatory Visit: Payer: Self-pay | Admitting: *Deleted

## 2013-05-11 MED ORDER — INSULIN PEN NEEDLE 32G X 4 MM MISC: Status: DC

## 2013-05-18 ENCOUNTER — Telehealth: Payer: Self-pay | Admitting: Endocrinology

## 2013-05-18 NOTE — Telephone Encounter (Signed)
done

## 2013-07-30 ENCOUNTER — Other Ambulatory Visit: Payer: Self-pay | Admitting: *Deleted

## 2013-07-30 MED ORDER — LEVOTHYROXINE SODIUM 200 MCG PO TABS: 200.0000 ug | ORAL_TABLET | Freq: Every day | ORAL | Status: DC

## 2013-08-21 ENCOUNTER — Other Ambulatory Visit: Payer: Self-pay | Admitting: Endocrinology

## 2013-08-21 NOTE — Telephone Encounter (Signed)
please call patient: Refill x 1 Ov is due

## 2013-10-01 ENCOUNTER — Other Ambulatory Visit: Payer: Self-pay

## 2013-10-01 ENCOUNTER — Emergency Department (INDEPENDENT_AMBULATORY_CARE_PROVIDER_SITE_OTHER)
Admission: EM | Admit: 2013-10-01 | Discharge: 2013-10-01 | Disposition: A | Discharge status: Home / Self Care | Payer: BC Managed Care – PPO | Source: Home / Self Care | Attending: Family Medicine | Admitting: Family Medicine

## 2013-10-01 ENCOUNTER — Encounter (HOSPITAL_COMMUNITY): Payer: Self-pay | Admitting: Emergency Medicine

## 2013-10-01 DIAGNOSIS — J40 Bronchitis, not specified as acute or chronic: Secondary | ICD-10-CM

## 2013-10-01 MED ORDER — IPRATROPIUM-ALBUTEROL 0.5-2.5 (3) MG/3ML IN SOLN
3.0000 mL | Freq: Once | RESPIRATORY_TRACT | Status: AC
  Administered 2013-10-01: 3 mL via RESPIRATORY_TRACT

## 2013-10-01 MED ORDER — PREDNISONE 10 MG PO TABS: 30.0000 mg | ORAL_TABLET | Freq: Every day | ORAL | Status: DC

## 2013-10-01 MED ORDER — FENOFIBRATE MICRONIZED 134 MG PO CAPS: ORAL_CAPSULE | ORAL | Status: DC

## 2013-10-01 MED ORDER — ALBUTEROL SULFATE HFA 108 (90 BASE) MCG/ACT IN AERS: 2.0000 | INHALATION_SPRAY | Freq: Four times a day (QID) | RESPIRATORY_TRACT | Status: DC | PRN

## 2013-10-01 MED ORDER — AZITHROMYCIN 250 MG PO TABS: 250.0000 mg | ORAL_TABLET | Freq: Every day | ORAL | Status: DC

## 2013-10-01 MED ORDER — IPRATROPIUM BROMIDE 0.02 % IN SOLN
RESPIRATORY_TRACT | Status: AC
  Filled 2013-10-01: qty 2.5

## 2013-10-01 MED ORDER — ALBUTEROL SULFATE (2.5 MG/3ML) 0.083% IN NEBU
INHALATION_SOLUTION | RESPIRATORY_TRACT | Status: AC
  Filled 2013-10-01: qty 3

## 2013-10-01 NOTE — Discharge Instructions (Signed)
Thank you for coming in today. Take prednisone daily for 5 days.  Use azithromycin daily for 5 days.  Use albuterol as needed.  Call or go to the emergency room if you get worse, have trouble breathing, have chest pains, or palpitations.  Your blood sugar will be elevated over the next few days with prednisone.   Bronchitis Bronchitis is inflammation of the airways that extend from the windpipe into the lungs (bronchi). The inflammation often causes mucus to develop, which leads to a cough. If the inflammation becomes severe, it may cause shortness of breath. CAUSES  Bronchitis may be caused by:   Viral infections.   Bacteria.   Cigarette smoke.   Allergens, pollutants, and other irritants.  SIGNS AND SYMPTOMS  The most common symptom of bronchitis is a frequent cough that produces mucus. Other symptoms include:  Fever.   Body aches.   Chest congestion.   Chills.   Shortness of breath.   Sore throat.  DIAGNOSIS  Bronchitis is usually diagnosed through a medical history and physical exam. Tests, such as chest X-rays, are sometimes done to rule out other conditions.  TREATMENT  You may need to avoid contact with whatever caused the problem (smoking, for example). Medicines are sometimes needed. These may include:  Antibiotics. These may be prescribed if the condition is caused by bacteria.  Cough suppressants. These may be prescribed for relief of cough symptoms.   Inhaled medicines. These may be prescribed to help open your airways and make it easier for you to breathe.   Steroid medicines. These may be prescribed for those with recurrent (chronic) bronchitis. HOME CARE INSTRUCTIONS  Get plenty of rest.   Drink enough fluids to keep your urine clear or pale yellow (unless you have a medical condition that requires fluid restriction). Increasing fluids may help thin your secretions and will prevent dehydration.   Only take over-the-counter or  prescription medicines as directed by your health care provider.  Only take antibiotics as directed. Make sure you finish them even if you start to feel better.  Avoid secondhand smoke, irritating chemicals, and strong fumes. These will make bronchitis worse. If you are a smoker, quit smoking. Consider using nicotine gum or skin patches to help control withdrawal symptoms. Quitting smoking will help your lungs heal faster.   Put a cool-mist humidifier in your bedroom at night to moisten the air. This may help loosen mucus. Change the water in the humidifier daily. You can also run the hot water in your shower and sit in the bathroom with the door closed for 5 10 minutes.   Follow up with your health care provider as directed.   Wash your hands frequently to avoid catching bronchitis again or spreading an infection to others.  SEEK MEDICAL CARE IF: Your symptoms do not improve after 1 week of treatment.  SEEK IMMEDIATE MEDICAL CARE IF:  Your fever increases.  You have chills.   You have chest pain.   You have worsening shortness of breath.   You have bloody sputum.  You faint.  You have lightheadedness.  You have a severe headache.   You vomit repeatedly. MAKE SURE YOU:   Understand these instructions.  Will watch your condition.  Will get help right away if you are not doing well or get worse. Document Released: 08/02/2005 Document Revised: 05/23/2013 Document Reviewed: 03/27/2013 [facility] Patient Information 2014 [place], [place].

## 2013-10-01 NOTE — ED Notes (Addendum)
C/o tightness in chest x 1 week, worse past couple of days, hist sleep apnea. Sleeps on side, so thought related to apnea. Denies N/V/sweats, dizziness. NAD a tpresent. Out of plavix  ( for his blood pressure)for a couple of days

## 2013-10-01 NOTE — ED Provider Notes (Signed)
Mahalia LongestMichael S Blaschke is a 53 y.o. male who presents to Urgent Care today for shortness of breath. Patient has mild worsening shortness of breath over the past week.  He notes some mild tightness. He denies any central or exertional or crushing chest pain. He notes some mild wheezing and cough as well. He has not tried any medications yet. He feels well otherwise. History of former smoking about one year ago.   Past Medical History  Diagnosis Date  . HYPOTHYROIDISM 09/19/2007  . DIABETES MELLITUS, TYPE II 09/19/2007  . HYPERLIPIDEMIA 09/19/2007  . GOUT 04/17/2010  . ANXIETY 09/19/2007  . ALCOHOL USE 10/24/2008  . SMOKER 04/17/2010  . HYPERTENSION 09/19/2007  . External thrombosed hemorrhoids 08/23/2008  . OTHER DISEASES OF LUNG NOT ELSEWHERE CLASSIFIED 07/20/2010  . SCROTAL ABSCESS 05/27/2010  . Sebaceous cyst 08/01/2008  . OSTEOARTHRITIS 09/19/2007  . SLEEP APNEA 08/22/2009  . Rash and other nonspecific skin eruption 09/19/2007  . Cough 05/22/2008   History  Substance Use Topics  . Smoking status: Former Smoker -- 0.50 packs/day    Quit date: 11/05/2012  . Smokeless tobacco: Never Used  . Alcohol Use: Yes     Comment: pt says he needs to minimize   ROS as above Medications: No current facility-administered medications for this encounter.   Current Outpatient Prescriptions  Medication Sig Dispense Refill  . albuterol (PROVENTIL HFA;VENTOLIN HFA) 108 (90 BASE) MCG/ACT inhaler Inhale 2 puffs into the lungs every 6 (six) hours as needed for wheezing or shortness of breath.  1 Inhaler  2  . azithromycin (ZITHROMAX) 250 MG tablet Take 1 tablet (250 mg total) by mouth daily. Take first 2 tablets together, then 1 every day until finished.  6 tablet  0  . BD ULTRA-FINE PEN NEEDLES 29G X 12.7MM MISC USE TWICE A DAY AS DIRECTED  100 each  3  . clomiPHENE (CLOMID) 50 MG tablet 1/4 tab daily  10 tablet  5  . diclofenac (VOLTAREN) 75 MG EC tablet TAKE 1 TABLET BY MOUTH TWICE A DAY AS NEEDED FOR PAIN  180 tablet  0  .  diphenhydrAMINE (BENADRYL) 50 MG capsule Take 50 mg by mouth at bedtime as needed.      . fenofibrate micronized (LOFIBRA) 134 MG capsule TAKE ONE CAPSULE BY MOUTH EVERY DAY  30 capsule  3  . glucose blood (ONE TOUCH ULTRA TEST) test strip Two times a day dx 250.01       . Insulin Glargine (LANTUS SOLOSTAR) 100 UNIT/ML SOPN Inject 300 Units into the skin daily. And pen needles 1/day  35 pen  11  . Insulin Pen Needle (B-D ULTRAFINE III SHORT PEN) 31G X 8 MM MISC Use as directed two times a day      . Insulin Pen Needle 32G X 4 MM MISC USE AS DIRECTED TWO TIMES DAILY  100 each  2  . levothyroxine (SYNTHROID, LEVOTHROID) 200 MCG tablet Take 1 tablet (200 mcg total) by mouth daily.  30 tablet  6  . metoprolol (LOPRESSOR) 50 MG tablet TAKE 1 TABLET BY MOUTH TWICE A DAY  60 tablet  7  . omeprazole (PRILOSEC OTC) 20 MG tablet Take 20 mg by mouth daily as needed. For indigestion      . predniSONE (DELTASONE) 10 MG tablet Take 3 tablets (30 mg total) by mouth daily.  15 tablet  0  . Respiratory Therapy Supplies (ADULT MASK) MISC 2 Devices by Does not apply route once.  2 each  0  .  simvastatin (ZOCOR) 40 MG tablet TAKE 1/2 TABLET AT BEDTIME  30 tablet  5  . traMADol (ULTRAM) 50 MG tablet Take 1 tablet (50 mg total) by mouth every 6 (six) hours as needed for pain.  50 tablet  3    Exam:  BP 180/81  Pulse 104  Temp(Src) 98.5 F (36.9 C) (Oral)  Resp 16  SpO2 97% Gen: Well NAD obese HEENT: EOMI,  MMM Lungs: Normal work of breathing. Coarse breath sounds and wheezing present bilaterally on expiration Heart: RRR no MRG Abd: NABS, Soft. NT, ND Exts: Brisk capillary refill, warm and well perfused.   Patient was given a DuoNeb nebulizer treatment and had complete resolution of symptoms and normalization of his lung exam.  Twelve-lead EKG shows normal sinus rhythm at 99 beats per minute. No ST segment depression. Plan the lateral precordial leads. Unchanged from prior EKG    Assessment and Plan: 53  y.o. male with bronchitis. Doubtful for ACS given unchanged EKG lack of central chest pain and improvement with albuterol. Plan to use prednisone albuterol and azithromycin. Followup with primary care provider  Discussed warning signs or symptoms. Please see discharge instructions. Patient expresses understanding.    Rodolph Bong, MD 10/01/13 1500

## 2013-10-05 ENCOUNTER — Telehealth: Payer: Self-pay

## 2013-10-05 NOTE — Telephone Encounter (Signed)
Pt called stating that he is out of insulin and needs a refill. However, pt is currently taking 300 units Lantus and his sugars are running high. Pt states that his blood sugars have been in the high 200s and now that he is on prednisone his numbers have been in the high 300's. Pt was advised that medication can spike blood sugar readings, but was concerned with the high numbers before. Pt wanted to know if changes could be made and new rx can be sent in. Please advise, Thanks!

## 2013-10-05 NOTE — Telephone Encounter (Signed)
Refill x 1 Ov < 1 week

## 2013-10-08 ENCOUNTER — Telehealth: Payer: Self-pay

## 2013-10-08 NOTE — Telephone Encounter (Signed)
Pt contacted and coming in for OV tomorrow.

## 2013-10-08 NOTE — Telephone Encounter (Signed)
The patient called and stated he was instructed to increase his lantus pens (due to his sickness and having to take a steroid med) - he is hoping to have his rx changed to 350 units per day.  He states he is down to his last pen.    Pt callback - [phone]

## 2013-10-08 NOTE — Telephone Encounter (Signed)
Pt contacted and coming in for visit tomorrow.

## 2013-10-09 ENCOUNTER — Ambulatory Visit (INDEPENDENT_AMBULATORY_CARE_PROVIDER_SITE_OTHER): Payer: BC Managed Care – PPO | Admitting: Endocrinology

## 2013-10-09 ENCOUNTER — Encounter: Payer: Self-pay | Admitting: Endocrinology

## 2013-10-09 ENCOUNTER — Ambulatory Visit: Payer: BC Managed Care – PPO | Admitting: Endocrinology

## 2013-10-09 VITALS — BP 140/100 | HR 90 | Temp 97.9°F | Ht 75.0 in | Wt 379.0 lb

## 2013-10-09 DIAGNOSIS — R0989 Other specified symptoms and signs involving the circulatory and respiratory systems: Secondary | ICD-10-CM

## 2013-10-09 DIAGNOSIS — E039 Hypothyroidism, unspecified: Secondary | ICD-10-CM

## 2013-10-09 DIAGNOSIS — R0600 Dyspnea, unspecified: Secondary | ICD-10-CM

## 2013-10-09 DIAGNOSIS — E291 Testicular hypofunction: Secondary | ICD-10-CM

## 2013-10-09 DIAGNOSIS — R0609 Other forms of dyspnea: Secondary | ICD-10-CM

## 2013-10-09 DIAGNOSIS — E119 Type 2 diabetes mellitus without complications: Secondary | ICD-10-CM

## 2013-10-09 DIAGNOSIS — M109 Gout, unspecified: Secondary | ICD-10-CM

## 2013-10-09 DIAGNOSIS — E785 Hyperlipidemia, unspecified: Secondary | ICD-10-CM

## 2013-10-09 LAB — CBC WITH DIFFERENTIAL/PLATELET
BASOS ABS: 0 10*3/uL (ref 0.0–0.1)
Basophils Relative: 0.3 % (ref 0.0–3.0)
EOS ABS: 0.2 10*3/uL (ref 0.0–0.7)
Eosinophils Relative: 2.7 % (ref 0.0–5.0)
HCT: 44.4 % (ref 39.0–52.0)
HEMOGLOBIN: 14.6 g/dL (ref 13.0–17.0)
LYMPHS PCT: 27.5 % (ref 12.0–46.0)
Lymphs Abs: 2.3 10*3/uL (ref 0.7–4.0)
MCHC: 32.9 g/dL (ref 30.0–36.0)
MCV: 88.9 fl (ref 78.0–100.0)
MONOS PCT: 8.1 % (ref 3.0–12.0)
Monocytes Absolute: 0.7 10*3/uL (ref 0.1–1.0)
NEUTROS PCT: 61.4 % (ref 43.0–77.0)
Neutro Abs: 5.2 10*3/uL (ref 1.4–7.7)
PLATELETS: 217 10*3/uL (ref 150.0–400.0)
RBC: 4.99 Mil/uL (ref 4.22–5.81)
RDW: 13.6 % (ref 11.5–14.6)
WBC: 8.4 10*3/uL (ref 4.5–10.5)

## 2013-10-09 LAB — LIPID PANEL
CHOLESTEROL: 166 mg/dL (ref 0–200)
HDL: 42.9 mg/dL (ref >39.00)
Total CHOL/HDL Ratio: 4
Triglycerides: 521 mg/dL — ABNORMAL HIGH (ref 0.0–149.0)
VLDL: 104.2 mg/dL — AB (ref 0.0–40.0)

## 2013-10-09 LAB — TSH: TSH: 12.24 u[IU]/mL — ABNORMAL HIGH (ref 0.35–5.50)

## 2013-10-09 LAB — BASIC METABOLIC PANEL WITH GFR
BUN: 17 mg/dL (ref 6–23)
CO2: 28 mEq/L (ref 19–32)
CREATININE: 0.9 mg/dL (ref 0.50–1.35)
Calcium: 10.3 mg/dL (ref 8.4–10.5)
Chloride: 98 mEq/L (ref 96–112)
GLUCOSE: 300 mg/dL — AB (ref 70–99)
POTASSIUM: 4.4 meq/L (ref 3.5–5.3)
Sodium: 138 mEq/L (ref 135–145)

## 2013-10-09 LAB — LDL CHOLESTEROL, DIRECT: LDL DIRECT: 75.7 mg/dL

## 2013-10-09 LAB — URIC ACID: Uric Acid, Serum: 6.7 mg/dL (ref 4.0–7.8)

## 2013-10-09 LAB — TESTOSTERONE: Testosterone: 142.76 ng/dL — ABNORMAL LOW (ref 350.00–890.00)

## 2013-10-09 LAB — PSA: PSA: 2.64 ng/mL (ref 0.10–4.00)

## 2013-10-09 LAB — HEMOGLOBIN A1C: Hgb A1c MFr Bld: 10.6 % — ABNORMAL HIGH (ref 4.6–6.5)

## 2013-10-09 MED ORDER — TERBINAFINE HCL 250 MG PO TABS: 250.0000 mg | ORAL_TABLET | Freq: Every day | ORAL | Status: DC

## 2013-10-09 MED ORDER — INSULIN GLARGINE 100 UNIT/ML SOLOSTAR PEN: 330.0000 [IU] | PEN_INJECTOR | Freq: Every day | SUBCUTANEOUS | Status: DC

## 2013-10-09 NOTE — Patient Instructions (Addendum)
Please increase the lantus to 330 units daily. check your blood sugar twice a day.  vary the time of day when you check, between before the 3 meals, and at bedtime.  also check if you have symptoms of your blood sugar being too high or too low.  please keep a record of the readings and bring it to your next appointment here.  You can write it on any piece of paper.  please call us sooner if your blood sugar goes below 70, or if you have a lot of readings over 200. Weight-loss helps the breathing.   Please come back for a regular physical appointment in 3 months.   i have sent a prescription to your pharmacy, for the toenail fungus.  Please start taking this only if today's liver blood tests are normal.

## 2013-10-09 NOTE — Progress Notes (Signed)
Subjective:    Patient ID: Johnny Andrews, male    DOB: May 20, 1961, 53 y.o.   MRN: [id number]  HPI Pt returns for f/u of insulin-requiring DM (dx'ed 1997; he has mild if any neuropathy of the lower extremities.  no known associated complications; characterized by severe insulin resistance; therapy has been chronically limited by noncompliance, so he needs a simple insulin schedule). He says he takes the insulin only intermittently.  When he takes the insulin, it is 300 units qd.  On this, cbg's are still 160-320.   He was rx'ed for acute bronchitis at urgent care 2 weeks ago.  He just finished prednisone, and feels better now.  He has chronic doe.  He has quit smoking.   Past Medical History  Diagnosis Date  . HYPOTHYROIDISM 09/19/2007  . DIABETES MELLITUS, TYPE II 09/19/2007  . HYPERLIPIDEMIA 09/19/2007  . GOUT 04/17/2010  . ANXIETY 09/19/2007  . ALCOHOL USE 10/24/2008  . SMOKER 04/17/2010  . HYPERTENSION 09/19/2007  . External thrombosed hemorrhoids 08/23/2008  . OTHER DISEASES OF LUNG NOT ELSEWHERE CLASSIFIED 07/20/2010  . SCROTAL ABSCESS 05/27/2010  . Sebaceous cyst 08/01/2008  . OSTEOARTHRITIS 09/19/2007  . SLEEP APNEA 08/22/2009  . Rash and other nonspecific skin eruption 09/19/2007  . Cough 05/22/2008    Past Surgical History  Procedure Laterality Date  . Nasal septum surgery      History   Social History  . Marital Status: Married    Spouse Name: N/A    Number of Children: N/A  . Years of Education: N/A   Occupational History  . [employer]   Social History Main Topics  . Smoking status: Former Smoker -- 0.50 packs/day    Quit date: 11/05/2012  . Smokeless tobacco: Never Used  . Alcohol Use: Yes     Comment: pt says he needs to minimize  . Drug Use: No  . Sexual Activity: Not on file   Other Topics Concern  . Not on file   Social History Narrative   Regular exercise-no    Current Outpatient Prescriptions on File Prior to Visit  Medication Sig Dispense Refill  .  albuterol (PROVENTIL HFA;VENTOLIN HFA) 108 (90 BASE) MCG/ACT inhaler Inhale 2 puffs into the lungs every 6 (six) hours as needed for wheezing or shortness of breath.  1 Inhaler  2  . BD ULTRA-FINE PEN NEEDLES 29G X 12.7MM MISC USE TWICE A DAY AS DIRECTED  100 each  3  . clomiPHENE (CLOMID) 50 MG tablet 1/4 tab daily  10 tablet  5  . diclofenac (VOLTAREN) 75 MG EC tablet TAKE 1 TABLET BY MOUTH TWICE A DAY AS NEEDED FOR PAIN  180 tablet  0  . diphenhydrAMINE (BENADRYL) 50 MG capsule Take 50 mg by mouth at bedtime as needed.      . fenofibrate micronized (LOFIBRA) 134 MG capsule TAKE ONE CAPSULE BY MOUTH EVERY DAY  30 capsule  3  . glucose blood (ONE TOUCH ULTRA TEST) test strip Two times a day dx 250.01       . Insulin Pen Needle (B-D ULTRAFINE III SHORT PEN) 31G X 8 MM MISC Use as directed two times a day      . Insulin Pen Needle 32G X 4 MM MISC USE AS DIRECTED TWO TIMES DAILY  100 each  2  . levothyroxine (SYNTHROID, LEVOTHROID) 200 MCG tablet Take 1 tablet (200 mcg total) by mouth daily.  30 tablet  6  . metoprolol (LOPRESSOR) 50 MG tablet TAKE 1  TABLET BY MOUTH TWICE A DAY  60 tablet  7  . omeprazole (PRILOSEC OTC) 20 MG tablet Take 20 mg by mouth daily as needed. For indigestion      . predniSONE (DELTASONE) 10 MG tablet Take 3 tablets (30 mg total) by mouth daily.  15 tablet  0  . Respiratory Therapy Supplies (ADULT MASK) MISC 2 Devices by Does not apply route once.  2 each  0  . simvastatin (ZOCOR) 40 MG tablet TAKE 1/2 TABLET AT BEDTIME  30 tablet  5  . traMADol (ULTRAM) 50 MG tablet Take 1 tablet (50 mg total) by mouth every 6 (six) hours as needed for pain.  50 tablet  3   No current facility-administered medications on file prior to visit.    No Known Allergies  Family History  Problem Relation Age of Onset  . Breast cancer Mother     Breast Cancer    BP 140/100  Pulse 90  Temp(Src) 97.9 F (36.6 C) (Oral)  Ht 6\' 3"  (1.905 m)  Wt 379 lb (171.913 kg)  BMI 47.37 kg/m2  SpO2  93%    Review of Systems  Constitutional:       He has gained weight  Eyes: Negative for visual disturbance.  Cardiovascular: Negative for chest pain.  Gastrointestinal: Negative for nausea.  Endocrine: Negative for cold intolerance.  Genitourinary: Negative for frequency.  Musculoskeletal: Negative for back pain.  Skin: Negative for rash.  Neurological: Negative for syncope.  Hematological: Does not bruise/bleed easily.  Psychiatric/Behavioral: Positive for sleep disturbance.   ED symptoms persist.  He denies hypoglycemia. He has toenail fungus.     Objective:   Physical Exam VITAL SIGNS:  See vs page GENERAL: no distress LUNGS:  Clear to auscultation.     (i reviewed spirometry results) Lab Results  Component Value Date   WBC 8.4 10/09/2013   HGB 14.6 10/09/2013   HCT 44.4 10/09/2013   PLT 217.0 10/09/2013   GLUCOSE 300* 10/09/2013   CHOL 166 10/09/2013   TRIG 521.0 Triglyceride is over 400; calculations on Lipids are invalid.* 10/09/2013   HDL 42.90 10/09/2013   LDLDIRECT 75.7 10/09/2013   ALT 32 10/09/2013   AST 24 10/09/2013   NA 138 10/09/2013   K 4.4 10/09/2013   CL 98 10/09/2013   CREATININE 0.90 10/09/2013   BUN 17 10/09/2013   CO2 28 10/09/2013   TSH 12.24* 10/09/2013   PSA 2.64 10/09/2013   HGBA1C 10.6* 10/09/2013   MICROALBUR 96.5* 02/05/2013      Assessment & Plan:  DM: very poor control.  This causes very high risk to his health Noncompliance.  This compromises the therapy of his diabetes.  i'll do the best i can. Hypothyroidism: therapy is probably  limited by noncompliance.  i'll do the best i can. Hypogonadism: therapy probably  limited by noncompliance.  i'll do the best i can. Sob: due to obesity. Onychomycosis: he requests rx.  ED: he probably has multiple causes for this.

## 2013-10-10 LAB — HEPATIC FUNCTION PANEL (6)
ALBUMIN: 4 g/dL (ref 3.5–5.5)
ALK PHOS: 93 IU/L (ref 39–117)
ALT: 32 IU/L (ref 0–44)
AST: 24 IU/L (ref 0–40)
BILIRUBIN DIRECT: 0.08 mg/dL (ref 0.00–0.40)
BILIRUBIN TOTAL: 0.4 mg/dL (ref 0.0–1.2)

## 2013-10-11 ENCOUNTER — Other Ambulatory Visit: Payer: Self-pay | Admitting: Endocrinology

## 2013-10-11 MED ORDER — LEVOTHYROXINE SODIUM 112 MCG PO TABS: ORAL_TABLET | ORAL | Status: DC

## 2013-10-12 ENCOUNTER — Telehealth: Payer: Self-pay

## 2013-10-12 NOTE — Telephone Encounter (Deleted)
Call Monday

## 2013-10-15 NOTE — Telephone Encounter (Signed)
Pt called and instructed to check with [facility] on generic for lamisil and clomid.

## 2013-10-28 ENCOUNTER — Ambulatory Visit (INDEPENDENT_AMBULATORY_CARE_PROVIDER_SITE_OTHER): Payer: BC Managed Care – PPO | Admitting: Family Medicine

## 2013-10-28 VITALS — BP 154/94 | HR 101 | Temp 98.3°F | Resp 20 | Ht 75.0 in | Wt 279.0 lb

## 2013-10-28 DIAGNOSIS — N529 Male erectile dysfunction, unspecified: Secondary | ICD-10-CM

## 2013-10-28 DIAGNOSIS — R059 Cough, unspecified: Secondary | ICD-10-CM

## 2013-10-28 DIAGNOSIS — G4730 Sleep apnea, unspecified: Secondary | ICD-10-CM

## 2013-10-28 DIAGNOSIS — R05 Cough: Secondary | ICD-10-CM

## 2013-10-28 DIAGNOSIS — J209 Acute bronchitis, unspecified: Secondary | ICD-10-CM

## 2013-10-28 DIAGNOSIS — E291 Testicular hypofunction: Secondary | ICD-10-CM

## 2013-10-28 DIAGNOSIS — E119 Type 2 diabetes mellitus without complications: Secondary | ICD-10-CM

## 2013-10-28 MED ORDER — HYDROCOD POLST-CHLORPHEN POLST 10-8 MG/5ML PO LQCR: 5.0000 mL | Freq: Two times a day (BID) | ORAL | Status: DC | PRN

## 2013-10-28 MED ORDER — FLUTICASONE-SALMETEROL 250-50 MCG/DOSE IN AEPB: 1.0000 | INHALATION_SPRAY | Freq: Two times a day (BID) | RESPIRATORY_TRACT | Status: DC

## 2013-10-28 MED ORDER — INSULIN PEN NEEDLE 29G X 12.7MM MISC: 200.0000 "application " | Freq: Two times a day (BID) | Status: DC

## 2013-10-28 MED ORDER — TESTOSTERONE 20.25 MG/1.25GM (1.62%) TD GEL: 1.2500 g | Freq: Every day | TRANSDERMAL | Status: DC

## 2013-10-28 NOTE — Progress Notes (Signed)
Is a 53 year old man works in [employer]. Has a long history of diabetes and hypothyroidism. Over the years, his thyroid isn't increased. He did not note that he is not supposed to take thyroid medicine is same time as his other medicines.  He's had a cough for over a month. The cough is nonproductive but it does cause him to have chest pain and occasionally comes close to vomiting. He's been on antibiotic and some prednisone which helped but over the last week his situation as gotten worse.  He also has hypogonadism with a testosterone reported as 150. He is married but has not been able to have a normal erection or sex life for over year. He's been taking Clomid prescribed by his doctor which has not helped him.  Objective: No acute distress , morbidly obese Chest: Expiratory wheezes Neck: Supple no adenopathy HEENT: Unremarkable  Assessment: Complicated individual multiple problems. Regarding the hypothyroidism, I spent time talking the patient about taking his thyroid on the empty stomach and reducing it back to 112 mcg per day so that it would be a more reasonable dose.  We also talked about erectile dysfunction hypogonadism and he's willing to take AndroGel for this.  Regarding the chronic cough, it appears he has a cyclical cough and this needs some suppression a long with proper dilators.  Overall, patient understands that he needs to exercise regularly, change his diet including cutting back from 6 beers every night to a more reasonable one beer per night. He needs to lose weight and he is aware that things have to change.  Past patient come back and axilla 10 days as well as get a 3 month check for reviewing his AndroGel.  Cough - Plan: Fluticasone-Salmeterol (ADVAIR DISKUS) 250-50 MCG/DOSE AEPB, chlorpheniramine-HYDROcodone (TUSSIONEX PENNKINETIC ER) 10-8 MG/5ML LQCR  Hypogonadism male - Plan: Testosterone (ANDROGEL) 20.25 MG/1.25GM (1.62%) GEL  Diabetes - Plan: Insulin Pen  Needle (BD ULTRA-FINE PEN NEEDLES) 29G X 12.7MM MISC  Erectile dysfunction  Sleep apnea  Acute bronchitis  Signed, Elvina SidleKurt Ayron Fillinger, MD

## 2013-10-28 NOTE — Patient Instructions (Signed)
Testosterone skin gel What is this medicine? TESTOSTERONE (tes TOS ter one) is the main male hormone. It supports normal male traits such as muscle growth, facial hair, and deep voice. This gel is used in males to treat low testosterone levels. This medicine may be used for other purposes; ask your health care provider or pharmacist if you have questions. COMMON BRAND NAME(S): AndroGel, FORTESTA, Testim What should I tell my health care provider before I take this medicine? They need to know if you have any of these conditions: -breast cancer -diabetes -heart disease -if a male partner is pregnant or trying to get pregnant -kidney disease -liver disease -lung disease -prostate cancer, enlargement -an unusual or allergic reaction to testosterone, soy proteins, other medicines, foods, dyes, or preservatives -pregnant or trying to get pregnant -breast-feeding How should I use this medicine? This medicine is for external use only. This medicine is applied at the same time every day (preferably in the morning) to clean, dry, intact skin. If you take a bath or shower in the morning, apply the gel after the bath or shower. Follow the directions on the prescription label. Make sure that you are using your testosterone gel product correctly and applying it only to the appropriate skin area (see below). Allow the skin to dry a few minutes then cover with clothing to prevent others from coming in contact with the medicine on your skin. The gel is flammable. Avoid fire, flame, or smoking until the gel has dried. Wash your hands with soap and water after use. For AndroGel Packets: Open the packet(s) needed for your dose. You can put the entire dose into your palm all at once or just a little at a time to apply. If you prefer, you can instead squeeze the gel directly onto the area you are applying it to. Apply on the shoulders, upper arm, or abdomen as directed. Do not apply to the scrotum or genitals. Be  sure you use the correct total dose. It is best to wait 5 to 6 hours after application of the gel before showering or swimming. For AndroGel 1%: Pump the dose into the palm of your hand. You can put the entire dose into your palm all at once or just a little at a time to apply. If you prefer, you can instead pump the gel directly onto the area you are applying it to. Apply on the shoulders, upper arm, or abdomen as directed. Do not apply to the scrotum or genitals. Be sure you use the correct total dose. It is best to wait for 5 to 6 hours after application of the gel before showering or swimming. For AndroGel 1.62%: Pump the dose into the palm of your hand. Dispense one pump of gel at a time into the palm of your hand before applying it. If you prefer, you can instead pump the gel directly onto the area you are applying it to. Apply on the shoulders and upper arms as directed. Do not apply to other parts of the body including the abdomen or genitals. Be sure you use the correct total dose. It is best to wait 2 hours after application of the gel before washing, showering, or swimming. For Testim: Open the tube(s) needed for your dose. Squeeze the gel from the tube into the palm of your hand. Apply on the shoulders or upper arms as directed. Do not apply to the scrotum, genitals, or abdomen. Be sure you use the correct total dose. Do not shower   or swim for at least 2 hours after application of the gel. For Fortesta: Use the multi-dose pump to pump the gel directly onto the area you are applying it to. Apply on the thighs as directed. Do not apply to the abdomen, penis, scrotum, shoulders or upper arms. Gently rub the gel onto the skin using your finger. Be sure you use the correct total dose. Do not shower or swim for at least 2 hours after application of the gel. A special MedGuide will be given to you by the pharmacist with each prescription and refill. Be sure to read this information carefully each  time. Talk to your pediatrician regarding the use of this medicine in children. Special care may be needed. Overdosage: If you think you have taken too much of this medicine contact a poison control center or emergency room at once. NOTE: This medicine is only for you. Do not share this medicine with others. What if I miss a dose? If you miss a dose, use it as soon as you can. If it is almost time for your next dose, use only that dose. Do not use double or extra doses. What may interact with this medicine? -medicines for diabetes -medicines that treat or prevent blood clots like warfarin -oxyphenbutazone -propranolol -steroid medicines like prednisone or cortisone This list may not describe all possible interactions. Give your health care provider a list of all the medicines, herbs, non-prescription drugs, or dietary supplements you use. Also tell them if you smoke, drink alcohol, or use illegal drugs. Some items may interact with your medicine. What should I watch for while using this medicine? Visit your doctor or health care professional for regular checks on your progress. They will need to check the level of testosterone in your blood. This medicine can transfer from your body to others. If a person or pet comes in contact with the area where this medicine was applied to your skin, they may have a serious risk of side effects. If you cannot avoid skin-to-skin contact with another person, make sure the site where this medicine was applied is covered with clothing. If accidental contact happens, the skin of the person or pet should be washed right away with soap and water. Also, a male partner who is pregnant or trying to get pregnant should avoid contact with the gel or treated skin. This medicine may affect blood sugar levels. If you have diabetes, check with your doctor or health care professional before you change your diet or the dose of your diabetic medicine. This drug is banned from  use in athletes by most athletic organizations. What side effects may I notice from receiving this medicine? Side effects that you should report to your doctor or health care professional as soon as possible: -allergic reactions like skin rash, itching or hives, swelling of the face, lips, or tongue -breast enlargement -breathing problems -changes in mood, especially anger, depression, or rage -dark urine -general ill feeling or flu-like symptoms -light-colored stools -loss of appetite, nausea -nausea, vomiting -right upper belly pain -stomach pain -swelling of ankles -too frequent or persistent erections -trouble passing urine or change in the amount of urine -unusually weak or tired -yellowing of the eyes or skin Side effects that usually do not require medical attention (report to your doctor or health care professional if they continue or are bothersome): -acne -change in sex drive or performance -hair loss -headache This list may not describe all possible side effects. Call your doctor for medical   advice about side effects. You may report side effects to FDA at 1-800-FDA-1088. Where should I keep my medicine? Keep out of the reach of children. This medicine can be abused. Keep your medicine in a safe place to protect it from theft. Do not share this medicine with anyone. Selling or giving away this medicine is dangerous and against the law. Store at room temperature between 15 to 30 degrees C (59 to 86 degrees F). Keep closed until use. Protect from heat and light. This medicine is flammable. Avoid exposure to heat, fire, flame, and smoking. Throw away any unused medicine after the expiration date. NOTE: This sheet is a summary. It may not cover all possible information. If you have questions about this medicine, talk to your doctor, pharmacist, or health care provider.  2014, Elsevier/Gold Standard. (2009-12-15 16:45:50) Cough, Adult  A cough is a reflex that helps clear your  throat and airways. It can help heal the body or may be a reaction to an irritated airway. A cough may only last 2 or 3 weeks (acute) or may last more than 8 weeks (chronic).  CAUSES Acute cough:  Viral or bacterial infections. Chronic cough:  Infections.  Allergies.  Asthma.  Post-nasal drip.  Smoking.  Heartburn or acid reflux.  Some medicines.  Chronic lung problems (COPD).  Cancer. SYMPTOMS   Cough.  Fever.  Chest pain.  Increased breathing rate.  High-pitched whistling sound when breathing (wheezing).  Colored mucus that you cough up (sputum). TREATMENT   A bacterial cough may be treated with antibiotic medicine.  A viral cough must run its course and will not respond to antibiotics.  Your caregiver may recommend other treatments if you have a chronic cough. HOME CARE INSTRUCTIONS   Only take over-the-counter or prescription medicines for pain, discomfort, or fever as directed by your caregiver. Use cough suppressants only as directed by your caregiver.  Use a cold steam vaporizer or humidifier in your bedroom or home to help loosen secretions.  Sleep in a semi-upright position if your cough is worse at night.  Rest as needed.  Stop smoking if you smoke. SEEK IMMEDIATE MEDICAL CARE IF:   You have pus in your sputum.  Your cough starts to worsen.  You cannot control your cough with suppressants and are losing sleep.  You begin coughing up blood.  You have difficulty breathing.  You develop pain which is getting worse or is uncontrolled with medicine.  You have a fever. MAKE SURE YOU:   Understand these instructions.  Will watch your condition.  Will get help right away if you are not doing well or get worse. Document Released: 01/29/2011 Document Revised: 10/25/2011 Document Reviewed: 01/29/2011 [facility] Patient Information 2014 [employer], [place].

## 2013-10-31 ENCOUNTER — Telehealth: Payer: Self-pay

## 2013-10-31 NOTE — Telephone Encounter (Signed)
Pt states he needs a prior authorization on his testosterone medication.   [facility] [address]- Pt would like a rtn call.

## 2013-10-31 NOTE — Telephone Encounter (Signed)
Checked Prior Authorizations we have not received anything.  Called and asked Pharmacy to refax prior auth to us.  [facility] [address]

## 2013-10-31 NOTE — Telephone Encounter (Signed)
Patient is calling about his testosterone cream that the pharmacy has faxed over to see if this has been done or not please call patient with questions at [phone]

## 2013-11-01 NOTE — Telephone Encounter (Signed)
Pt called to report that his pharmacist stated that their fax was not working and when he offered to give the pt's ins info over the phone he was told it had to be faxed. I assured pt that I would be happy to call his pharmacy and get the info needed. Pt had info and gave me number to [employer] and ID #. Called and received approval through 11/01/14 and notified pt and pharm by phone.

## 2013-12-12 ENCOUNTER — Other Ambulatory Visit: Payer: Self-pay

## 2013-12-12 MED ORDER — METOPROLOL TARTRATE 50 MG PO TABS: ORAL_TABLET | ORAL | Status: DC

## 2013-12-13 ENCOUNTER — Other Ambulatory Visit: Payer: Self-pay | Admitting: Endocrinology

## 2014-01-24 ENCOUNTER — Other Ambulatory Visit: Payer: Self-pay | Admitting: Endocrinology

## 2014-02-04 ENCOUNTER — Other Ambulatory Visit: Payer: Self-pay | Admitting: Family Medicine

## 2014-02-15 ENCOUNTER — Other Ambulatory Visit: Payer: Self-pay | Admitting: Endocrinology

## 2014-03-04 ENCOUNTER — Telehealth: Payer: Self-pay

## 2014-03-04 NOTE — Telephone Encounter (Signed)
Called pt to schedule follow up appt with MD and they stated they are currently going to the Urgent Care on [address] for medical care concerning DM.

## 2014-03-14 ENCOUNTER — Other Ambulatory Visit: Payer: Self-pay | Admitting: Family Medicine

## 2014-03-15 ENCOUNTER — Other Ambulatory Visit: Payer: Self-pay | Admitting: Endocrinology

## 2014-03-15 NOTE — Telephone Encounter (Signed)
Please advise in Dr Ellison's absence.  

## 2014-03-19 ENCOUNTER — Telehealth: Payer: Self-pay

## 2014-05-03 ENCOUNTER — Ambulatory Visit (INDEPENDENT_AMBULATORY_CARE_PROVIDER_SITE_OTHER): Payer: BC Managed Care – PPO | Admitting: Family Medicine

## 2014-05-03 VITALS — BP 146/94 | HR 110 | Temp 98.0°F | Resp 16 | Ht 74.75 in | Wt 373.8 lb

## 2014-05-03 DIAGNOSIS — J209 Acute bronchitis, unspecified: Secondary | ICD-10-CM

## 2014-05-03 DIAGNOSIS — I10 Essential (primary) hypertension: Secondary | ICD-10-CM

## 2014-05-03 DIAGNOSIS — E785 Hyperlipidemia, unspecified: Secondary | ICD-10-CM

## 2014-05-03 DIAGNOSIS — E119 Type 2 diabetes mellitus without complications: Secondary | ICD-10-CM

## 2014-05-03 DIAGNOSIS — R05 Cough: Secondary | ICD-10-CM

## 2014-05-03 DIAGNOSIS — E039 Hypothyroidism, unspecified: Secondary | ICD-10-CM

## 2014-05-03 DIAGNOSIS — R059 Cough, unspecified: Secondary | ICD-10-CM

## 2014-05-03 DIAGNOSIS — N529 Male erectile dysfunction, unspecified: Secondary | ICD-10-CM

## 2014-05-03 LAB — POCT GLYCOSYLATED HEMOGLOBIN (HGB A1C): HEMOGLOBIN A1C: 10

## 2014-05-03 MED ORDER — ALBUTEROL SULFATE HFA 108 (90 BASE) MCG/ACT IN AERS: 2.0000 | INHALATION_SPRAY | Freq: Four times a day (QID) | RESPIRATORY_TRACT | Status: DC | PRN

## 2014-05-03 MED ORDER — HYDROCODONE-HOMATROPINE 5-1.5 MG/5ML PO SYRP: 5.0000 mL | ORAL_SOLUTION | ORAL | Status: DC | PRN

## 2014-05-03 MED ORDER — FENOFIBRATE MICRONIZED 134 MG PO CAPS: 134.0000 mg | ORAL_CAPSULE | Freq: Every day | ORAL | Status: DC

## 2014-05-03 MED ORDER — SILDENAFIL CITRATE 100 MG PO TABS: 50.0000 mg | ORAL_TABLET | Freq: Every day | ORAL | Status: DC | PRN

## 2014-05-03 MED ORDER — AMOXICILLIN 875 MG PO TABS: 875.0000 mg | ORAL_TABLET | Freq: Two times a day (BID) | ORAL | Status: DC

## 2014-05-03 NOTE — Patient Instructions (Signed)
Amoxicillin and albuterol Hycodan Cough syrup Drink plenty of fluids and get enough rest Prescription for Viagra to try to use Advise seeing an endocrinologist to try to the diabetes entered under control. The patient needs to really work hard on complaints himself.

## 2014-05-03 NOTE — Progress Notes (Signed)
Subjective: 53 year old man who is here with a respiratory tract infection for the past 10 days. He's been coughing and wheezing and has felt febrile. He has kept working. He no longer smokes. He has been coughing up phlegm. He is wheezing. He just generally feels lousy.  He is concerned he has allergy problems, and like to send them to check out that what he is allergic to.  He has numerous health concerns, and currently does not have anyone primary physician. He knows that some point he probably needs to be seeing an endocrinologist. He is diabetic and has run hemoglobin A1c is in the 10-11 range quite consistently for many years. He takes a high dose of insulin, Lantus,  325 units daily. He has a history of high lipids, and has been on simvastatin and fenofibrate for that. He has a history of hypertension. He has sleep apnea. He does not get a lot of consistent exercise, and as a erratic job of trying to watch his diet. He formerly smoked but has not. He has been having problems with erectile dysfunction. He has chronic wheezing.  Objective: Obese gentleman, 53 years old. TMs are normal. Throat clear. Neck supple without nodes. Chest has soft wheezing bilaterally. Heart regular without murmurs. He has some pigmented changes of his ankles under the sock line, darker pigmentation.  Assessment: Bronchitis with wheezing Diabetes, poorly controlled Hypertension Obesity Erectile dysfunction Hyperlipidemia History of sleep apnea  Plan: Amoxicillin and albuterol Hycodan Cough syrup Drink plenty of fluids and get enough rest Prescription for Viagra to try to use Advise seeing an endocrinologist to try to the diabetes entered under control. The patient needs to really work hard on complaints himself.   Results for orders placed in visit on 05/03/14  POCT GLYCOSYLATED HEMOGLOBIN (HGB A1C)      Result Value Ref Range   Hemoglobin A1C 10.0

## 2014-05-04 ENCOUNTER — Encounter: Payer: Self-pay | Admitting: Family Medicine

## 2014-05-04 LAB — LIPID PANEL
CHOLESTEROL: 187 mg/dL (ref 0–200)
HDL: 36 mg/dL — AB (ref >39)
Total CHOL/HDL Ratio: 5.2 Ratio
Triglycerides: 469 mg/dL — ABNORMAL HIGH (ref <150)

## 2014-05-04 LAB — COMPREHENSIVE METABOLIC PANEL
ALBUMIN: 4.1 g/dL (ref 3.5–5.2)
ALT: 38 U/L (ref 0–53)
AST: 31 U/L (ref 0–37)
Alkaline Phosphatase: 87 U/L (ref 39–117)
BUN: 10 mg/dL (ref 6–23)
CO2: 26 meq/L (ref 19–32)
Calcium: 9.3 mg/dL (ref 8.4–10.5)
Chloride: 98 mEq/L (ref 96–112)
Creat: 0.67 mg/dL (ref 0.50–1.35)
GLUCOSE: 199 mg/dL — AB (ref 70–99)
POTASSIUM: 4.1 meq/L (ref 3.5–5.3)
Sodium: 136 mEq/L (ref 135–145)
Total Bilirubin: 0.6 mg/dL (ref 0.2–1.2)
Total Protein: 7.1 g/dL (ref 6.0–8.3)

## 2014-05-14 ENCOUNTER — Other Ambulatory Visit: Payer: Self-pay | Admitting: Endocrinology

## 2014-05-17 ENCOUNTER — Other Ambulatory Visit: Payer: Self-pay | Admitting: Endocrinology

## 2014-05-31 ENCOUNTER — Encounter: Payer: Self-pay | Admitting: Endocrinology

## 2014-05-31 ENCOUNTER — Ambulatory Visit (INDEPENDENT_AMBULATORY_CARE_PROVIDER_SITE_OTHER): Payer: BC Managed Care – PPO | Admitting: Endocrinology

## 2014-05-31 VITALS — BP 132/88 | HR 91 | Temp 98.3°F | Ht 74.75 in | Wt 380.0 lb

## 2014-05-31 DIAGNOSIS — R200 Anesthesia of skin: Secondary | ICD-10-CM | POA: Insufficient documentation

## 2014-05-31 MED ORDER — INSULIN GLARGINE 100 UNIT/ML SOLOSTAR PEN: 400.0000 [IU] | PEN_INJECTOR | SUBCUTANEOUS | Status: DC

## 2014-05-31 MED ORDER — BENZONATATE 100 MG PO CAPS: 100.0000 mg | ORAL_CAPSULE | Freq: Three times a day (TID) | ORAL | Status: DC | PRN

## 2014-05-31 NOTE — Patient Instructions (Addendum)
Please increase the lantus to 400 units daily.  i have sent a prescription to your pharmacy.  check your blood sugar twice a day.  vary the time of day when you check, between before the 3 meals, and at bedtime.  also check if you have symptoms of your blood sugar being too high or too low.  please keep a record of the readings and bring it to your next appointment here.  You can write it on any piece of paper.  please call us sooner if your blood sugar goes below 70, or if you have a lot of readings over 200. Please come back for a regular physical appointment in 3 months.   i have sent a prescription to your pharmacy, for an anti-cough pill.   Let's check a nerve-ending test. you will receive a phone call, about a day and time for an appointment.   Please see a weight-loss surgery specialist.  you will receive a phone call, about a day and time for an appointment.

## 2014-05-31 NOTE — Progress Notes (Signed)
Subjective:    Patient ID: Johnny Andrews, male    DOB: [birth year], 53 y.o.   MRN: [id number]  HPI Pt returns for f/u of diabetes mellitus:  DM type: 1997 Dx'ed: Complications:  Therapy: insulin DKA: never Severe hypoglycemia: never Pancreatitis: never Other: severe insulin resistance; therapy has been chronically limited by noncompliance, so he needs a simple insulin schedule. Interval history:  He takes lantus, 300 units qam and 50 units qpm. He says he never misses any doses.  no cbg record, but states cbg's vary from 180-350.  There is no trend throughout the day. He has 3 weeks of slightly prod cough, but no assoc sob.  This started in the context of a URI, which is now better.   He stopped androgel, due to lack of improvement of ED sxs. Past Medical History  Diagnosis Date  . HYPOTHYROIDISM 09/19/2007  . DIABETES MELLITUS, TYPE II 09/19/2007  . HYPERLIPIDEMIA 09/19/2007  . GOUT 04/17/2010  . ANXIETY 09/19/2007  . ALCOHOL USE 10/24/2008  . SMOKER 04/17/2010  . HYPERTENSION 09/19/2007  . External thrombosed hemorrhoids 08/23/2008  . OTHER DISEASES OF LUNG NOT ELSEWHERE CLASSIFIED 07/20/2010  . SCROTAL ABSCESS 05/27/2010  . Sebaceous cyst 08/01/2008  . OSTEOARTHRITIS 09/19/2007  . SLEEP APNEA 08/22/2009  . Rash and other nonspecific skin eruption 09/19/2007  . Cough 05/22/2008    Past Surgical History  Procedure Laterality Date  . Nasal septum surgery      History   Social History  . Marital Status: Married    Spouse Name: N/A    Number of Children: N/A  . Years of Education: N/A   Occupational History  . [employer]   Social History Main Topics  . Smoking status: Former Smoker -- 0.50 packs/day    Quit date: 11/05/2012  . Smokeless tobacco: Never Used  . Alcohol Use: Yes     Comment: pt says he needs to minimize  . Drug Use: No  . Sexual Activity: Not on file   Other Topics Concern  . Not on file   Social History Narrative   Regular exercise-no    Current  Outpatient Prescriptions on File Prior to Visit  Medication Sig Dispense Refill  . diclofenac (VOLTAREN) 75 MG EC tablet TAKE 1 TABLET BY MOUTH TWICE A DAY AS NEEDED FOR PAIN  180 tablet  0  . fenofibrate micronized (LOFIBRA) 134 MG capsule Take 1 capsule (134 mg total) by mouth daily before breakfast. NEED LABS!!  30 capsule  1  . Fluticasone-Salmeterol (ADVAIR DISKUS) 250-50 MCG/DOSE AEPB Inhale 1 puff into the lungs 2 (two) times daily.  60 each  1  . glucose blood (ONE TOUCH ULTRA TEST) test strip Two times a day dx 250.01       . Insulin Pen Needle (BD ULTRA-FINE PEN NEEDLES) 29G X 12.7MM MISC Apply 200 application topically 2 (two) times daily.  200 each  3  . levothyroxine (SYNTHROID, LEVOTHROID) 112 MCG tablet TAKE 2 TABLETS DAILY  60 tablet  0  . metoprolol (LOPRESSOR) 50 MG tablet TAKE 1 TABLET BY MOUTH TWICE A DAY  60 tablet  7  . Respiratory Therapy Supplies (ADULT MASK) MISC 2 Devices by Does not apply route once.  2 each  0  . sildenafil (VIAGRA) 100 MG tablet Take 0.5-1 tablets (50-100 mg total) by mouth daily as needed for erectile dysfunction.  5 tablet  3  . simvastatin (ZOCOR) 40 MG tablet TAKE 1/2 TABLET BY MOUTH AT BEDTIME  30  tablet  1  . traMADol (ULTRAM) 50 MG tablet Take 1 tablet (50 mg total) by mouth every 6 (six) hours as needed for pain.  50 tablet  3   No current facility-administered medications on file prior to visit.    No Known Allergies  Family History  Problem Relation Age of Onset  . Breast cancer Mother     Breast Cancer  . Heart disease Father     BP 132/88  Pulse 91  Temp(Src) 98.3 F (36.8 C) (Oral)  Ht 6' 2.75" (1.899 m)  Wt 380 lb (172.367 kg)  BMI 47.80 kg/m2  SpO2 95%  Review of Systems He has weight gain.  Chronic foot pain and numbness persists.      Objective:   Physical Exam VITAL SIGNS:  See vs page.   GENERAL: no distress.  LUNGS:  Clear to auscultation.   Pulses: dorsalis pedis intact bilat.  Feet: no deformity. normal  color and temp. 1+ bilat leg edema. There is bilateral onychomycosis and dry skin.  Skin: no ulcer on the feet.   Neuro: sensation is intact to touch on the feet.  Motor is grossly 5/5 throughout the LE's.    Lab Results  Component Value Date   HGBA1C 10.0 05/03/2014      Assessment & Plan:  Cough, new to me.  Persistent after recent URI.   DM: poor control.  Morbid obesity: worse.  ED: not improved with androgel.     Patient is advised the following: Patient Instructions  Please increase the lantus to 400 units daily.  i have sent a prescription to your pharmacy.  check your blood sugar twice a day.  vary the time of day when you check, between before the 3 meals, and at bedtime.  also check if you have symptoms of your blood sugar being too high or too low.  please keep a record of the readings and bring it to your next appointment here.  You can write it on any piece of paper.  please call us sooner if your blood sugar goes below 70, or if you have a lot of readings over 200. Please come back for a regular physical appointment in 3 months.   i have sent a prescription to your pharmacy, for an anti-cough pill.   Let's check a nerve-ending test. you will receive a phone call, about a day and time for an appointment.   Please see a weight-loss surgery specialist.  you will receive a phone call, about a day and time for an appointment.

## 2014-06-03 ENCOUNTER — Other Ambulatory Visit: Payer: Self-pay | Admitting: *Deleted

## 2014-06-03 DIAGNOSIS — R200 Anesthesia of skin: Secondary | ICD-10-CM

## 2014-06-05 ENCOUNTER — Telehealth: Payer: Self-pay

## 2014-06-05 ENCOUNTER — Ambulatory Visit (INDEPENDENT_AMBULATORY_CARE_PROVIDER_SITE_OTHER): Payer: BC Managed Care – PPO | Admitting: Neurology

## 2014-06-05 DIAGNOSIS — E119 Type 2 diabetes mellitus without complications: Secondary | ICD-10-CM

## 2014-06-05 DIAGNOSIS — R200 Anesthesia of skin: Secondary | ICD-10-CM

## 2014-06-05 NOTE — Telephone Encounter (Signed)
Lvom advising [facility]. Requested call back if need be.

## 2014-06-05 NOTE — Procedures (Signed)
[facility]  [address], [address]  [place], [place] [address] Tel: [phone] Fax:  [phone] Test Date:  06/05/2014  Patient: Johnny Andrews DOB: 1961/01/30 Physician: Nita Sickleonika Azelea Seguin  Sex: Male Height: 6\' 3"  Ref Phys: Romero Bellingllison, Sean  ID#: [id number] Temp: 35.0C Technician: Ala BentSusan Reid R. NCS T.   Patient Complaints: Patient is a 53 year old male here for evaluation of bilateral foot numbness for past year.  NCV & EMG Findings: Extensive electrodiagnostic testing of the right lower extremity and additional studies of the left reveals:  1. Bilateral sural and superficial peroneal sensory responses are absent. 2. Bilateral peroneal (EDB) and tibial motor responses are essentially absent. Bilateral peroneal motor responses recording at the tibialis anterior are reduced in amplitude. 3. Chronic motor axon loss changes are seen affecting the anterior tibialis, gastrocnemius, and flexor digitorum longus muscles, without accompanied active denervation.   Impression: 1. 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 Overall, these findings are moderate-to-severe in degree electrically. 2. There is no definite evidence of a superimposed lumbosacral radiculopathy.    ___________________________ Nita Sickleonika Nabila Albarracin    Nerve Conduction Studies Anti Sensory Summary Table   Stim Site NR Peak (ms) Norm Peak (ms) P-T Amp (V) Norm P-T Amp  Left Sup Peroneal Anti Sensory (Ant Lat Mall)  12 cm NR  <4.6  >4  Right Sup Peroneal Anti Sensory (Ant Lat Mall)  12 cm NR  <4.6  >4  Left Sural Anti Sensory (Lat Mall)  Calf NR  <4.6  >4  Right Sural Anti Sensory (Lat Mall)  Calf NR  <4.6  >4   Motor Summary Table   Stim Site NR Onset (ms) Norm Onset (ms) O-P Amp (mV) Norm O-P Amp Site1 Site2 Delta-0 (ms) Dist (cm) Vel (m/s) Norm Vel (m/s)  Left Peroneal Motor (Ext Dig  Brev)  Ankle NR  <6.0  >2.5        Right Peroneal Motor (Ext Dig Brev)  Ankle NR  <6.0  >2.5        Left Peroneal TA Motor (Tib Ant)  Fib Head    4.5 <4.5 2.6 >3 Poplit Fib Head 2.6 11.0 42 >40  Poplit    7.1  2.4         Right Peroneal TA Motor (Tib Ant)  Fib Head    4.1 <4.5 2.4 >3 Poplit Fib Head 1.7 8.5 50 >40  Poplit    5.8  2.3         Left Tibial Motor (Abd Hall Brev)  Ankle NR  <6.0  >4        Right Tibial Motor (Abd Hall Brev)  Ankle    5.9 <6.0 0.4 >4 Knee Ankle  0.0  >40  Knee NR             EMG   Side Muscle Ins Act Fibs Psw Fasc Number Recrt Dur Dur. Amp Amp. Poly Poly. Comment  Right AntTibialis Nml Nml Nml Nml 2- Rapid Many 1+ Many 1+ Many 1+ N/A  Right Gastroc Nml Nml Nml Nml 2- Mod-R Some 1+ Some 1+ Nml Nml N/A  Right Flex Dig Long Nml Nml Nml Nml 3- Rapid Many 1+ Many 1+ Nml Nml N/A  Right RectFemoris Nml Nml Nml Nml Nml Nml Nml Nml Nml Nml Nml Nml N/A  Right BicepsFemS Nml Nml Nml Nml Nml Nml Nml Nml Nml Nml Nml Nml N/A  Right GluteusMed Nml Nml Nml Nml Nml Nml Nml Nml Nml Nml Nml Nml N/A  Left AntTibialis Nml Nml Nml Nml 2- Rapid Many 1+ Many 1+ Nml Nml N/A  Left Gastroc Nml Nml Nml Nml 2- Mod-R Some 1+ Some 1+ Nml Nml N/A  Left Flex Dig Long Nml Nml Nml Nml 3- Rapid Many 1+ Nml Nml Nml Nml N/A  Left RectFemoris Nml Nml Nml Nml Nml Nml Nml Nml Nml Nml Nml Nml N/A  Left BicepsFemS Nml Nml Nml Nml Nml Nml Nml Nml Nml Nml Nml Nml N/A      Waveforms:

## 2014-06-05 NOTE — Telephone Encounter (Signed)
Sorry, should be diabetes.  i redid the referral

## 2014-06-05 NOTE — Telephone Encounter (Signed)
[facility]. Wanted to verify the dx, numbness is correct.  Please advise, Thanks!

## 2014-06-28 ENCOUNTER — Other Ambulatory Visit: Payer: Self-pay | Admitting: Endocrinology

## 2014-07-03 ENCOUNTER — Other Ambulatory Visit: Payer: Self-pay | Admitting: Family Medicine

## 2014-07-04 ENCOUNTER — Other Ambulatory Visit: Payer: Self-pay | Admitting: Endocrinology

## 2014-07-05 NOTE — Telephone Encounter (Signed)
Please advise if ok to refill rx. Last time pt was seen was 09/29/2013. Thanks!

## 2014-07-13 ENCOUNTER — Other Ambulatory Visit: Payer: Self-pay | Admitting: Family Medicine

## 2014-08-13 ENCOUNTER — Other Ambulatory Visit: Payer: Self-pay | Admitting: Family Medicine

## 2014-08-13 ENCOUNTER — Other Ambulatory Visit: Payer: Self-pay | Admitting: Endocrinology

## 2014-08-20 ENCOUNTER — Ambulatory Visit (INDEPENDENT_AMBULATORY_CARE_PROVIDER_SITE_OTHER): Payer: BC Managed Care – PPO | Admitting: Endocrinology

## 2014-08-20 ENCOUNTER — Encounter: Payer: Self-pay | Admitting: Endocrinology

## 2014-08-20 VITALS — BP 150/82 | HR 96 | Temp 98.7°F | Ht 74.75 in | Wt 380.0 lb

## 2014-08-20 DIAGNOSIS — F1010 Alcohol abuse, uncomplicated: Secondary | ICD-10-CM

## 2014-08-20 DIAGNOSIS — R200 Anesthesia of skin: Secondary | ICD-10-CM

## 2014-08-20 DIAGNOSIS — E291 Testicular hypofunction: Secondary | ICD-10-CM

## 2014-08-20 DIAGNOSIS — Z0000 Encounter for general adult medical examination without abnormal findings: Secondary | ICD-10-CM

## 2014-08-20 DIAGNOSIS — E039 Hypothyroidism, unspecified: Secondary | ICD-10-CM

## 2014-08-20 DIAGNOSIS — E119 Type 2 diabetes mellitus without complications: Secondary | ICD-10-CM

## 2014-08-20 NOTE — Patient Instructions (Addendum)
blood tests are being requested for you today.  We'll let you know about the results. Please continue to pursue the weight loss surgery.  check your blood sugar twice a day.  vary the time of day when you check, between before the 3 meals, and at bedtime.  also check if you have symptoms of your blood sugar being too high or too low.  please keep a record of the readings and bring it to your next appointment here.  You can write it on any piece of paper.  please call us sooner if your blood sugar goes below 70, or if you have a lot of readings over 200. please consider these measures for your health:  minimize alcohol.  do not use tobacco products.  have a colonoscopy at least every 10 years from age 54.  keep firearms safely stored.  always use seat belts.  have working smoke alarms in your home.  see an eye doctor and dentist regularly.  never drive under the influence of alcohol or drugs (including prescription drugs).  those with fair skin should take precautions against the sun. Please come back for a follow-up appointment in 3 months.

## 2014-08-20 NOTE — Progress Notes (Signed)
Subjective:    Patient ID: Johnny Andrews, male    DOB: 1961-05-08, 54 y.o.   MRN: [id number]  HPI Pt is here for regular wellness examination, and is feeling pretty well in general, and says chronic med probs are stable, except as noted below Past Medical History  Diagnosis Date  . HYPOTHYROIDISM 09/19/2007  . DIABETES MELLITUS, TYPE II 09/19/2007  . HYPERLIPIDEMIA 09/19/2007  . GOUT 04/17/2010  . ANXIETY 09/19/2007  . ALCOHOL USE 10/24/2008  . SMOKER 04/17/2010  . HYPERTENSION 09/19/2007  . External thrombosed hemorrhoids 08/23/2008  . OTHER DISEASES OF LUNG NOT ELSEWHERE CLASSIFIED 07/20/2010  . SCROTAL ABSCESS 05/27/2010  . Sebaceous cyst 08/01/2008  . OSTEOARTHRITIS 09/19/2007  . SLEEP APNEA 08/22/2009  . Rash and other nonspecific skin eruption 09/19/2007  . Cough 05/22/2008    Past Surgical History  Procedure Laterality Date  . Nasal septum surgery      History   Social History  . Marital Status: Married    Spouse Name: N/A    Number of Children: N/A  . Years of Education: N/A   Occupational History  . [employer]   Social History Main Topics  . Smoking status: Former Smoker -- 0.50 packs/day    Quit date: 11/05/2012  . Smokeless tobacco: Never Used  . Alcohol Use: Yes     Comment: pt says he needs to minimize  . Drug Use: No  . Sexual Activity: Not on file   Other Topics Concern  . Not on file   Social History Narrative   Regular exercise-no    Current Outpatient Prescriptions on File Prior to Visit  Medication Sig Dispense Refill  . diclofenac (VOLTAREN) 75 MG EC tablet TAKE 1 TABLET BY MOUTH TWICE A DAY AS NEEDED FOR PAIN 180 tablet 0  . fenofibrate micronized (LOFIBRA) 134 MG capsule TAKE 1 CAPSULE (134 MG TOTAL) BY MOUTH DAILY BEFORE BREAKFAST. 30 capsule 3  . glucose blood (ONE TOUCH ULTRA TEST) test strip Two times a day dx 250.01     . Insulin Pen Needle (BD ULTRA-FINE PEN NEEDLES) 29G X 12.7MM MISC Apply 200 application topically 2 (two) times daily. 200 each  3  . metoprolol (LOPRESSOR) 50 MG tablet TAKE 1 TABLET BY MOUTH TWICE A DAY 60 tablet 7  . Respiratory Therapy Supplies (ADULT MASK) MISC 2 Devices by Does not apply route once. 2 each 0  . sildenafil (VIAGRA) 100 MG tablet Take 0.5-1 tablets (50-100 mg total) by mouth daily as needed for erectile dysfunction. (Patient not taking: Reported on 08/20/2014) 5 tablet 3   No current facility-administered medications on file prior to visit.    No Known Allergies  Family History  Problem Relation Age of Onset  . Breast cancer Mother     Breast Cancer  . Heart disease Father     BP 150/82 mmHg  Pulse 96  Temp(Src) 98.7 F (37.1 C) (Oral)  Ht 6' 2.75" (1.899 m)  Wt 380 lb (172.367 kg)  BMI 47.80 kg/m2  SpO2 96%  Review of Systems  Constitutional: Negative for fever.  HENT: Negative for hearing loss.   Eyes: Negative for visual disturbance.  Respiratory: Negative for cough.   Cardiovascular: Negative for chest pain.  Gastrointestinal: Negative for anal bleeding.  Endocrine: Positive for heat intolerance.  Genitourinary: Negative for hematuria and difficulty urinating.  Allergic/Immunologic: Positive for environmental allergies.  Neurological: Negative for syncope and headaches.  Hematological: Does not bruise/bleed easily.  Psychiatric/Behavioral: Negative for dysphoric mood.  Objective:   Physical Exam VS: see vs page GEN: no distress HEAD: head: no deformity eyes: no periorbital swelling, no proptosis external nose and ears are normal mouth: no lesion seen NECK: supple, thyroid is not enlarged CHEST WALL: no deformity LUNGS: clear to auscultation BREASTS:  No gynecomastia CV: reg rate and rhythm, no murmur ABD: abdomen is soft, nontender.  no hepatosplenomegaly.  not distended.  Self-reducing ventral hernia RECTAL: normal external and internal exam.  heme neg. PROSTATE:  Normal size.  No nodule MUSCULOSKELETAL: muscle bulk and strength are grossly normal.  no  obvious joint swelling.  gait is normal and steady PULSES: no carotid bruit NEURO:  cn 2-12 grossly intact.   readily moves all 4's.   SKIN:  Normal texture and temperature.  No rash or suspicious lesion is visible.   NODES:  None palpable at the neck PSYCH: alert, well-oriented.  Does not appear anxious nor depressed.       Assessment & Plan:  <ECG> Wellness visit today, with problems stable, except as noted.   SEPARATE EVALUATION FOLLOWS--EACH PROBLEM HERE IS NEW, NOT RESPONDING TO TREATMENT, OR POSES SIGNIFICANT RISK TO THE PATIENT'S HEALTH: HISTORY OF THE PRESENT ILLNESS: Pt returns for f/u of diabetes mellitus:  DM type: Insulin-requiring type 2 Dx'ed: 1997 Complications: nephropathy and polyneuropathy.   Therapy: insulin DKA: never Severe hypoglycemia: never Pancreatitis: never Other: severe insulin resistance; therapy has been chronically limited by noncompliance, so he needs a simple insulin schedule.   Interval history: He says he never misses any doses.  no cbg record, but states cbg's are in the 200's.  There is no trend throughout the day.  He denies hypoglycemia.  PAST MEDICAL HISTORY reviewed and up to date today REVIEW OF SYSTEMS: He denies hypoglycemia and weight change. PHYSICAL EXAMINATION: VITAL SIGNS:  See vs page GENERAL: no distress Pulses: dorsalis pedis intact bilat.  Feet: no deformity. normal color and temp. 1+ bilat leg edema. There is bilateral onychomycosis and dry skin.  Skin: no ulcer on the feet, but there is hyperpigmentation.   Neuro: sensation is intact to touch on the feet, but decreased from normal.   LAB/XRAY RESULTS:  Lab Results  Component Value Date   CHOL 218* 08/21/2014   HDL 37.30* 08/21/2014   LDLCALC NOT CALC 05/03/2014   LDLDIRECT 112.3 08/21/2014   TRIG * 08/21/2014    457.0 Triglyceride is over 400; calculations on Lipids are invalid.   CHOLHDL 6 08/21/2014   Lab Results  Component Value Date   TSH 12.22* 08/21/2014     Lab Results  Component Value Date   HGBA1C 11.4* 08/21/2014  IMPRESSION: DM: severe exacerbation. Hypothyroidism: he needs increased rx. Dyslipidemia: severe exacerbation, prob due to noncompliance. PLAN:  Increase synthroid. increase lantus to 500 units qam. Resume zocor i have sent prescriptions to your pharmacy, for all 3 of these.   Pt also advised to d/c EtOH

## 2014-08-21 ENCOUNTER — Other Ambulatory Visit (INDEPENDENT_AMBULATORY_CARE_PROVIDER_SITE_OTHER): Payer: BC Managed Care – PPO

## 2014-08-21 DIAGNOSIS — E039 Hypothyroidism, unspecified: Secondary | ICD-10-CM

## 2014-08-21 DIAGNOSIS — E119 Type 2 diabetes mellitus without complications: Secondary | ICD-10-CM

## 2014-08-21 DIAGNOSIS — R200 Anesthesia of skin: Secondary | ICD-10-CM

## 2014-08-21 DIAGNOSIS — F1010 Alcohol abuse, uncomplicated: Secondary | ICD-10-CM

## 2014-08-21 LAB — MICROALBUMIN / CREATININE URINE RATIO
Creatinine,U: 40.5 mg/dL
MICROALB UR: 38.2 mg/dL — AB (ref 0.0–1.9)
MICROALB/CREAT RATIO: 94.2 mg/g — AB (ref 0.0–30.0)

## 2014-08-21 LAB — LIPID PANEL
Cholesterol: 218 mg/dL — ABNORMAL HIGH (ref 0–200)
HDL: 37.3 mg/dL — ABNORMAL LOW (ref >39.00)
NonHDL: 180.7
Total CHOL/HDL Ratio: 6
VLDL: 91.4 mg/dL — AB (ref 0.0–40.0)

## 2014-08-21 LAB — HEPATIC FUNCTION PANEL
ALK PHOS: 80 U/L (ref 39–117)
ALT: 30 U/L (ref 0–53)
AST: 28 U/L (ref 0–37)
Albumin: 3.9 g/dL (ref 3.5–5.2)
BILIRUBIN TOTAL: 0.9 mg/dL (ref 0.2–1.2)
Bilirubin, Direct: 0.1 mg/dL (ref 0.0–0.3)
TOTAL PROTEIN: 7.2 g/dL (ref 6.0–8.3)

## 2014-08-21 LAB — VITAMIN B12: Vitamin B-12: 301 pg/mL (ref 211–911)

## 2014-08-21 LAB — LDL CHOLESTEROL, DIRECT: LDL DIRECT: 112.3 mg/dL

## 2014-08-21 LAB — HEMOGLOBIN A1C: Hgb A1c MFr Bld: 11.4 % — ABNORMAL HIGH (ref 4.6–6.5)

## 2014-08-21 LAB — TSH: TSH: 12.22 u[IU]/mL — ABNORMAL HIGH (ref 0.35–4.50)

## 2014-08-21 MED ORDER — INSULIN GLARGINE 100 UNIT/ML SOLOSTAR PEN: 500.0000 [IU] | PEN_INJECTOR | SUBCUTANEOUS | Status: DC

## 2014-08-21 MED ORDER — SIMVASTATIN 40 MG PO TABS: 20.0000 mg | ORAL_TABLET | Freq: Every day | ORAL | Status: DC

## 2014-08-21 MED ORDER — LEVOTHYROXINE SODIUM 125 MCG PO TABS: ORAL_TABLET | ORAL | Status: DC

## 2014-08-23 LAB — HM DIABETES EYE EXAM

## 2014-08-26 ENCOUNTER — Telehealth: Payer: Self-pay | Admitting: Endocrinology

## 2014-08-26 NOTE — Telephone Encounter (Signed)
Contacted pharmacy and verified quantity of Lantus.

## 2014-08-26 NOTE — Telephone Encounter (Signed)
Pharmacy calling for clarifiacation on lantus solostar rx  [phone] Maralyn SagoSarah

## 2014-09-03 ENCOUNTER — Other Ambulatory Visit: Payer: Self-pay

## 2014-09-03 MED ORDER — METOPROLOL TARTRATE 50 MG PO TABS: ORAL_TABLET | ORAL | Status: DC

## 2014-09-11 ENCOUNTER — Telehealth: Payer: Self-pay | Admitting: Endocrinology

## 2014-09-11 DIAGNOSIS — E119 Type 2 diabetes mellitus without complications: Secondary | ICD-10-CM

## 2014-09-11 MED ORDER — INSULIN PEN NEEDLE 29G X 12.7MM MISC: 200.0000 "application " | Freq: Two times a day (BID) | Status: DC

## 2014-09-11 NOTE — Telephone Encounter (Signed)
Pt needs rx for insulin needles called into [facility] 12 mm long for the pen

## 2014-09-11 NOTE — Telephone Encounter (Signed)
Rx sent to pharmacy   

## 2014-09-23 ENCOUNTER — Other Ambulatory Visit: Payer: Self-pay | Admitting: Endocrinology

## 2014-10-29 ENCOUNTER — Ambulatory Visit (INDEPENDENT_AMBULATORY_CARE_PROVIDER_SITE_OTHER): Payer: BC Managed Care – PPO | Admitting: Emergency Medicine

## 2014-10-29 VITALS — BP 144/82 | HR 105 | Temp 98.2°F | Resp 17 | Ht 75.0 in | Wt 387.6 lb

## 2014-10-29 DIAGNOSIS — J301 Allergic rhinitis due to pollen: Secondary | ICD-10-CM | POA: Diagnosis not present

## 2014-10-29 DIAGNOSIS — Z9109 Other allergy status, other than to drugs and biological substances: Secondary | ICD-10-CM

## 2014-10-29 MED ORDER — ALBUTEROL SULFATE HFA 108 (90 BASE) MCG/ACT IN AERS: 2.0000 | INHALATION_SPRAY | RESPIRATORY_TRACT | Status: DC | PRN

## 2014-10-29 MED ORDER — TRIAMCINOLONE ACETONIDE 55 MCG/ACT NA AERO: 2.0000 | INHALATION_SPRAY | Freq: Every day | NASAL | Status: DC

## 2014-10-29 NOTE — Progress Notes (Signed)
[facility] [address], [place] [id number] [phone]  Date:  10/29/2014   Name:  Johnny Andrews   DOB:  18-Dec-1960   MRN:  [id number]  PCP:  Romero BellingELLISON, SEAN, MD    Chief Complaint: Respiratory Distress; Cough; and Nasal Congestion   History of Present Illness:  Johnny Andrews is a 54 y.o. very pleasant male patient who presents with the following:  Has nasal congestion and post nasal drip.  Clear nasal drainage No fever or chills. Non productive cough.  Wheezing.  Some exertional shortness of breath No nausea or vomiting No stool change No rash Taking OTC allergy meds No improvement with over the counter medications or other home remedies. Denies other complaint or health concern today.   Patient Active Problem List   Diagnosis Date Noted  . Numbness 05/31/2014  . Encounter for long-term (current) use of other medications 02/05/2013  . 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 Hypogonadism male 02/05/2013  . Low back pain 08/28/2012  . Elevated PSA 11/30/2011  . Perirectal abscess 11/16/2011  . OTHER DISEASES OF LUNG NOT ELSEWHERE CLASSIFIED 07/20/2010  . SCROTAL ABSCESS 05/27/2010  . GOUT 04/17/2010  . SMOKER 04/17/2010  . SLEEP APNEA 08/22/2009  . Alcohol abuse 10/24/2008  . Sebaceous cyst 08/01/2008  . Hypothyroidism 09/19/2007  . Diabetes 09/19/2007  . HYPERLIPIDEMIA 09/19/2007  . ANXIETY 09/19/2007  . HYPERTENSION 09/19/2007  . OSTEOARTHRITIS 09/19/2007  . Rash and other nonspecific skin eruption 09/19/2007    Past Medical History  Diagnosis Date  . HYPOTHYROIDISM 09/19/2007  . DIABETES MELLITUS, TYPE II 09/19/2007  . HYPERLIPIDEMIA 09/19/2007  . GOUT 04/17/2010  . ANXIETY 09/19/2007  . ALCOHOL USE 10/24/2008  . SMOKER 04/17/2010  . HYPERTENSION 09/19/2007  . External thrombosed hemorrhoids 08/23/2008  . OTHER DISEASES OF LUNG NOT ELSEWHERE CLASSIFIED 07/20/2010  . SCROTAL ABSCESS 05/27/2010  . Sebaceous cyst 08/01/2008  .  OSTEOARTHRITIS 09/19/2007  . SLEEP APNEA 08/22/2009  . Rash and other nonspecific skin eruption 09/19/2007  . Cough 05/22/2008    Past Surgical History  Procedure Laterality Date  . Nasal septum surgery      History  Substance Use Topics  . Smoking status: Former Smoker -- 0.50 packs/day    Quit date: 11/05/2012  . Smokeless tobacco: Never Used  . Alcohol Use: Yes     Comment: pt says he needs to minimize    Family History  Problem Relation Age of Onset  . Breast cancer Mother     Breast Cancer  . Heart disease Father     No Known Allergies  Medication list has been reviewed and updated.  Current Outpatient Prescriptions on File Prior to Visit  Medication Sig Dispense Refill  . diclofenac (VOLTAREN) 75 MG EC tablet TAKE 1 TABLET BY MOUTH TWICE A DAY AS NEEDED FOR PAIN 180 tablet 0  . fenofibrate micronized (LOFIBRA) 134 MG capsule TAKE 1 CAPSULE (134 MG TOTAL) BY MOUTH DAILY BEFORE BREAKFAST. 30 capsule 3  . glucose blood (ONE TOUCH ULTRA TEST) test strip Two times a day dx 250.01     . Insulin Glargine (LANTUS SOLOSTAR) 100 UNIT/ML Solostar Pen Inject 500 Units into the skin every morning. And pen needles 3/day 60 pen PRN  . Insulin Pen Needle (BD ULTRA-FINE PEN NEEDLES) 29G X 12.7MM MISC Apply 200 application topically 2 (two) times daily. 200 each 3  . levothyroxine (SYNTHROID, LEVOTHROID) 125 MCG tablet 2 tabs daily 60 tablet 11  . metoprolol (  LOPRESSOR) 50 MG tablet TAKE 1 TABLET BY MOUTH TWICE A DAY 60 tablet 7  . Respiratory Therapy Supplies (ADULT MASK) MISC 2 Devices by Does not apply route once. 2 each 0  . simvastatin (ZOCOR) 40 MG tablet Take 0.5 tablets (20 mg total) by mouth at bedtime. 30 tablet 11  . sildenafil (VIAGRA) 100 MG tablet Take 0.5-1 tablets (50-100 mg total) by mouth daily as needed for erectile dysfunction. (Patient not taking: Reported on 08/20/2014) 5 tablet 3   No current facility-administered medications on file prior to visit.    Review of  Systems:  As per HPI, otherwise negative.    Physical Examination: Filed Vitals:   10/29/14 1410  BP: 144/82  Pulse: 105  Temp: 98.2 F (36.8 C)  Resp: 17   Filed Vitals:   10/29/14 1410  Height:  (1.905 m)  Weight: 387 lb 9.6 oz (175.814 kg)   Body mass index is 48.45 kg/(m^2). Ideal Body Weight: Weight in (lb) to have BMI = 25: 199.6  GEN: morbidly obese, NAD, Non-toxic, A & O x 3 HEENT: Atraumatic, Normocephalic. Neck supple. No masses, No LAD.  Pallid and swollen nasal mucosa Ears and Nose: No external deformity. CV: RRR, No M/G/R. No JVD. No thrill. No extra heart sounds. PULM: CTA B, no wheezes, crackles, rhonchi. No retractions. No resp. distress. No accessory muscle use. ABD: S, NT, ND, +BS. No rebound. No HSM. EXTR: No c/c/e NEURO Normal gait.  PSYCH: Normally interactive. Conversant. Not depressed or anxious appearing.  Calm demeanor.    Assessment and Plan: SAR nasacort Albuterol  Signed,  Phillips Odor, MD

## 2014-10-29 NOTE — Patient Instructions (Signed)

## 2014-12-12 ENCOUNTER — Telehealth: Payer: Self-pay | Admitting: Endocrinology

## 2014-12-12 NOTE — Telephone Encounter (Signed)
Please call pt back regarding medication the pharmacy is not giving him the correct amounts  , call pt @[phone]

## 2014-12-13 MED ORDER — LEVOTHYROXINE SODIUM 125 MCG PO TABS: ORAL_TABLET | ORAL | Status: DC

## 2014-12-13 NOTE — Telephone Encounter (Signed)
Rx sent to pharmacy for Levothyroixe 125 mcg bid. Pt stated the pharmacy did not have refills for this medication.

## 2015-01-09 ENCOUNTER — Telehealth: Payer: Self-pay | Admitting: Endocrinology

## 2015-01-09 NOTE — Telephone Encounter (Signed)
Pt will have his B/P rechecked on 01/21/2015

## 2015-01-10 ENCOUNTER — Telehealth: Payer: Self-pay | Admitting: Endocrinology

## 2015-01-10 NOTE — Telephone Encounter (Signed)
Pt will have his HbgA1C rechecked on his up coming appt on 01/21/2015

## 2015-01-21 ENCOUNTER — Ambulatory Visit: Payer: BC Managed Care – PPO | Admitting: Endocrinology

## 2015-02-07 ENCOUNTER — Other Ambulatory Visit: Payer: Self-pay | Admitting: Endocrinology

## 2015-03-05 ENCOUNTER — Ambulatory Visit (INDEPENDENT_AMBULATORY_CARE_PROVIDER_SITE_OTHER): Payer: BC Managed Care – PPO

## 2015-03-05 ENCOUNTER — Ambulatory Visit (INDEPENDENT_AMBULATORY_CARE_PROVIDER_SITE_OTHER): Payer: BC Managed Care – PPO | Admitting: Family Medicine

## 2015-03-05 VITALS — BP 150/85 | HR 98 | Temp 98.3°F | Resp 18 | Ht 75.0 in | Wt 398.0 lb

## 2015-03-05 DIAGNOSIS — R0602 Shortness of breath: Secondary | ICD-10-CM

## 2015-03-05 DIAGNOSIS — E785 Hyperlipidemia, unspecified: Secondary | ICD-10-CM | POA: Diagnosis not present

## 2015-03-05 DIAGNOSIS — M1990 Unspecified osteoarthritis, unspecified site: Secondary | ICD-10-CM

## 2015-03-05 DIAGNOSIS — E038 Other specified hypothyroidism: Secondary | ICD-10-CM | POA: Diagnosis not present

## 2015-03-05 DIAGNOSIS — E1142 Type 2 diabetes mellitus with diabetic polyneuropathy: Secondary | ICD-10-CM | POA: Diagnosis not present

## 2015-03-05 DIAGNOSIS — E1165 Type 2 diabetes mellitus with hyperglycemia: Secondary | ICD-10-CM | POA: Diagnosis not present

## 2015-03-05 DIAGNOSIS — M7989 Other specified soft tissue disorders: Secondary | ICD-10-CM | POA: Diagnosis not present

## 2015-03-05 DIAGNOSIS — IMO0002 Reserved for concepts with insufficient information to code with codable children: Secondary | ICD-10-CM

## 2015-03-05 LAB — POCT UA - MICROSCOPIC ONLY
Bacteria, U Microscopic: NEGATIVE
Casts, Ur, LPF, POC: NEGATIVE
Crystals, Ur, HPF, POC: NEGATIVE
Mucus, UA: NEGATIVE
Yeast, UA: NEGATIVE

## 2015-03-05 LAB — POCT CBC
Granulocyte percent: 61.4 % (ref 37–80)
HCT, POC: 46.8 % (ref 43.5–53.7)
Hemoglobin: 15.6 g/dL (ref 14.1–18.1)
Lymph, poc: 2.4 (ref 0.6–3.4)
MCH, POC: 28.1 pg (ref 27–31.2)
MCHC: 33.4 g/dL (ref 31.8–35.4)
MCV: 84.3 fL (ref 80–97)
MID (cbc): 0.6 (ref 0–0.9)
MPV: 6.9 fL (ref 0–99.8)
POC Granulocyte: 4.8 (ref 2–6.9)
POC LYMPH PERCENT: 31.2 % (ref 10–50)
POC MID %: 7.4 %M (ref 0–12)
Platelet Count, POC: 247 10*3/uL (ref 142–424)
RBC: 5.56 M/uL (ref 4.69–6.13)
RDW, POC: 13.7 %
WBC: 7.8 10*3/uL (ref 4.6–10.2)

## 2015-03-05 LAB — POCT URINALYSIS DIPSTICK
Bilirubin, UA: NEGATIVE
Glucose, UA: >=1000
Ketones, UA: NEGATIVE
Leukocytes, UA: NEGATIVE
Nitrite, UA: NEGATIVE
Protein, UA: 100
Spec Grav, UA: 1.02
Urobilinogen, UA: 0.2
pH, UA: 7

## 2015-03-05 LAB — POCT GLYCOSYLATED HEMOGLOBIN (HGB A1C): Hemoglobin A1C: 9.8

## 2015-03-05 MED ORDER — FUROSEMIDE 20 MG PO TABS
20.0000 mg | ORAL_TABLET | ORAL | Status: DC | PRN
Start: 2015-03-05 — End: 2015-03-30

## 2015-03-05 MED ORDER — SIMVASTATIN 40 MG PO TABS: 20.0000 mg | ORAL_TABLET | Freq: Every day | ORAL | Status: DC

## 2015-03-05 MED ORDER — METOPROLOL TARTRATE 50 MG PO TABS: ORAL_TABLET | ORAL | Status: DC

## 2015-03-05 MED ORDER — FENOFIBRATE MICRONIZED 134 MG PO CAPS: ORAL_CAPSULE | ORAL | Status: DC

## 2015-03-05 MED ORDER — GABAPENTIN 100 MG PO CAPS: 100.0000 mg | ORAL_CAPSULE | Freq: Every day | ORAL | Status: DC

## 2015-03-05 MED ORDER — INSULIN GLARGINE 100 UNIT/ML SOLOSTAR PEN: 500.0000 [IU] | PEN_INJECTOR | SUBCUTANEOUS | Status: DC

## 2015-03-05 NOTE — Progress Notes (Signed)
Chief Complaint:  Chief Complaint  Patient presents with  . Leg Swelling    both x6 mths   . Medication Refill    all meds in epic     HPI: Johnny Andrews is a 54 y.o. male who reports to [facility] today complaining of:    1. DM-not well controlled, has neuropathy, sees Dr Everardo All, last year he has some msk lost, not walking much.Putting on more weight His feet gets really tingly, his knees are starting to hurt to bend, he ahs to lift his leg up. He has no hip pain, more knee pain.  2. Acute on chronic Short windedness  for the last 1 year, heating and air for 25 year, no lung disease in family and he stopped smoking 3 years ago, he smoked 1/2 ppd x 10-15 years off and on .Denies orthopena uses 1 pillow, sleeps on flat bed not on incline, has SOB with exertion working and also walking across parking lot. Has more leg swelling than normal due to    3. Hypothyroidism-taking meds at night with other meds before dinner. No SEs. No CP or palpitations.    Wt Readings from Last 3 Encounters:  03/05/15 398 lb (180.532 kg)  10/29/14 387 lb 9.6 oz (175.814 kg)  08/20/14 380 lb (172.367 kg)    BP Readings from Last 3 Encounters:  03/05/15 150/85  10/29/14 144/82  08/20/14 150/82   Lab Results  Component Value Date   HGBA1C 9.8 03/05/2015   HGBA1C 11.4* 08/21/2014   HGBA1C 10.0 05/03/2014   Lab Results  Component Value Date   MICROALBUR 31.6* 03/05/2015   LDLCALC NOT CALC 05/03/2014   CREATININE 0.65 03/05/2015   Lab Results  Component Value Date   TSH 9.512* 03/05/2015      Past Medical History  Diagnosis Date  . HYPOTHYROIDISM 09/19/2007  . DIABETES MELLITUS, TYPE II 09/19/2007  . HYPERLIPIDEMIA 09/19/2007  . GOUT 04/17/2010  . ANXIETY 09/19/2007  . ALCOHOL USE 10/24/2008  . SMOKER 04/17/2010  . HYPERTENSION 09/19/2007  . External thrombosed hemorrhoids 08/23/2008  . OTHER DISEASES OF LUNG NOT ELSEWHERE CLASSIFIED 07/20/2010  . SCROTAL ABSCESS 05/27/2010  . Sebaceous cyst  08/01/2008  . OSTEOARTHRITIS 09/19/2007  . SLEEP APNEA 08/22/2009  . Rash and other nonspecific skin eruption 09/19/2007  . Cough 05/22/2008   Past Surgical History  Procedure Laterality Date  . Nasal septum surgery     History   Social History  . Marital Status: Married    Spouse Name: N/A  . Number of Children: N/A  . Years of Education: N/A   Occupational History  . [employer]   Social History Main Topics  . Smoking status: Former Smoker -- 0.50 packs/day    Quit date: 11/05/2012  . Smokeless tobacco: Never Used  . Alcohol Use: Yes     Comment: pt says he needs to minimize  . Drug Use: No  . Sexual Activity: Not on file   Other Topics Concern  . None   Social History Narrative   Regular exercise-no   Family History  Problem Relation Age of Onset  . Breast cancer Mother     Breast Cancer  . Heart disease Father    No Known Allergies Prior to Admission medications   Medication Sig Start Date End Date Taking? Authorizing Provider  albuterol (PROVENTIL HFA;VENTOLIN HFA) 108 (90 BASE) MCG/ACT inhaler Inhale 2 puffs into the lungs every 4 (four) hours as needed for wheezing or  shortness of breath (cough, shortness of breath or wheezing.). 10/29/14  Yes Carmelina Dane, MD  diclofenac (VOLTAREN) 75 MG EC tablet TAKE 1 TABLET BY MOUTH TWICE A DAY AS NEEDED FOR PAIN 02/10/15  Yes Romero Belling, MD  fenofibrate micronized (LOFIBRA) 134 MG capsule TAKE 1 CAPSULE (134 MG TOTAL) BY MOUTH DAILY BEFORE BREAKFAST. 07/15/14  Yes Peyton Najjar, MD  glucose blood (ONE TOUCH ULTRA TEST) test strip Two times a day dx 250.01    Yes Historical Provider, MD  Insulin Glargine (LANTUS SOLOSTAR) 100 UNIT/ML Solostar Pen Inject 500 Units into the skin every morning. And pen needles 3/day 08/21/14  Yes Romero Belling, MD  Insulin Pen Needle (BD ULTRA-FINE PEN NEEDLES) 29G X 12.7MM MISC Apply 200 application topically 2 (two) times daily. 09/11/14  Yes Romero Belling, MD  levothyroxine (SYNTHROID,  LEVOTHROID) 125 MCG tablet 2 tabs daily 12/13/14  Yes Romero Belling, MD  metoprolol (LOPRESSOR) 50 MG tablet TAKE 1 TABLET BY MOUTH TWICE A DAY 09/03/14  Yes Romero Belling, MD  Respiratory Therapy Supplies (ADULT MASK) MISC 2 Devices by Does not apply route once. 11/24/11  Yes Oretha Milch, MD  simvastatin (ZOCOR) 40 MG tablet Take 0.5 tablets (20 mg total) by mouth at bedtime. 08/21/14  Yes Romero Belling, MD  triamcinolone (NASACORT AQ) 55 MCG/ACT AERO nasal inhaler Place 2 sprays into the nose daily. 10/29/14  Yes Carmelina Dane, MD  sildenafil (VIAGRA) 100 MG tablet Take 0.5-1 tablets (50-100 mg total) by mouth daily as needed for erectile dysfunction. Patient not taking: Reported on 08/20/2014 05/03/14   Peyton Najjar, MD     ROS: The patient denies fevers, chills, night sweats, unintentional weight loss, chest pain, palpitations, wheezing, nausea, vomiting, abdominal pain, dysuria, hematuria, melena, numbness, weakness, or tingling.   All other systems have been reviewed and were otherwise negative with the exception of those mentioned in the HPI and as above.    PHYSICAL EXAM: Filed Vitals:   03/05/15 1212  BP: 150/85  Pulse: 98  Temp: 98.3 F (36.8 C)  Resp: 18   Body mass index is 49.75 kg/(m^2).   General: Alert, no acute distress, morbidly obese HEENT:  Normocephalic, atraumatic, oropharynx patent. EOMI, PERRLA Cardiovascular:  Regular rate and rhythm, no rubs murmurs or gallops.  No Carotid bruits, radial pulse intact. + minimal nonon pedal edema.  Respiratory: Clear to auscultation bilaterally.  No cyanosis, no use of accessory musculature Abdominal: No organomegaly, abdomen is soft and non-tender, positive bowel sounds. No masses. Skin: No rashes. Neurologic: Facial musculature symmetric. Psychiatric: Patient acts appropriately throughout our interaction. Lymphatic: No cervical or submandibular lymphadenopathy Musculoskeletal: Gait intact. 5/5 strength + mircofilament exam,  + brisk  Dorsalis pedal pulses, no ulcers  LABS: Results for orders placed or performed in visit on 03/05/15  COMPLETE METABOLIC PANEL WITH GFR  Result Value Ref Range   Sodium 139 135 - 145 mEq/L   Potassium 4.6 3.5 - 5.3 mEq/L   Chloride 99 96 - 112 mEq/L   CO2 25 19 - 32 mEq/L   Glucose, Bld 218 (H) 70 - 99 mg/dL   BUN 15 6 - 23 mg/dL   Creat 1.61 0.96 - 0.45 mg/dL   Total Bilirubin 0.5 0.2 - 1.2 mg/dL   Alkaline Phosphatase 72 39 - 117 U/L   AST 29 0 - 37 U/L   ALT 32 0 - 53 U/L   Total Protein 6.8 6.0 - 8.3 g/dL   Albumin 4.1 3.5 -  5.2 g/dL   Calcium 9.6 8.4 - 30.810.5 mg/dL   GFR, Est African American >89 mL/min   GFR, Est Non African American >89 mL/min  TSH  Result Value Ref Range   TSH 9.512 (H) 0.350 - 4.500 uIU/mL  Microalbumin, urine  Result Value Ref Range   Microalb, Ur 31.6 (H) <2.0 mg/dL  POCT CBC  Result Value Ref Range   WBC 7.8 4.6 - 10.2 K/uL   Lymph, poc 2.4 0.6 - 3.4   POC LYMPH PERCENT 31.2 10 - 50 %L   MID (cbc) 0.6 0 - 0.9   POC MID % 7.4 0 - 12 %M   POC Granulocyte 4.8 2 - 6.9   Granulocyte percent 61.4 37 - 80 %G   RBC 5.56 4.69 - 6.13 M/uL   Hemoglobin 15.6 14.1 - 18.1 g/dL   HCT, POC 65.746.8 84.643.5 - 53.7 %   MCV 84.3 80 - 97 fL   MCH, POC 28.1 27 - 31.2 pg   MCHC 33.4 31.8 - 35.4 g/dL   RDW, POC 96.213.7 %   Platelet Count, POC 247 142 - 424 K/uL   MPV 6.9 0 - 99.8 fL  POCT glycosylated hemoglobin (Hb A1C)  Result Value Ref Range   Hemoglobin A1C 9.8   POCT UA - Microscopic Only  Result Value Ref Range   WBC, Ur, HPF, POC 0-1    RBC, urine, microscopic 0-1    Bacteria, U Microscopic neg    Mucus, UA neg    Epithelial cells, urine per micros 0-3    Crystals, Ur, HPF, POC neg    Casts, Ur, LPF, POC neg    Yeast, UA neg   POCT urinalysis dipstick  Result Value Ref Range   Color, UA yellow    Clarity, UA clear    Glucose, UA >=1000    Bilirubin, UA neg    Ketones, UA neg    Spec Grav, UA 1.020    Blood, UA tr-lysed    pH, UA 7.0     Protein, UA 100    Urobilinogen, UA 0.2    Nitrite, UA neg    Leukocytes, UA Negative Negative     EKG/XRAY:   [facility]. Parenchymal scarring, no acute findings   ASSESSMENT/PLAN: Encounter Diagnoses  Name Primary?  . Type 2 diabetes mellitus with diabetic polyneuropathy Yes  . SOB (shortness of breath)   . Other specified hypothyroidism   . Morbid obesity   . Arthritis   . Uncontrolled diabetes mellitus   . Leg swelling   . Hyperlipidemia    54 y/o morbidly obese male with PMH of poorly controlled DM, hypothyroidism, HTN, OSA, hyperlipidemia, alcohol use, h/o lung scarring  who presents with acute on chronic SOB/DOE for the last 1 year and chronic leg swelling for the last 6 months.  Rx lasix 20 mg daily prn leg swelling Refer to pulmonology Refer to cardiology He is ? Supposed to be on metformin, has not refilled it in sometime but not sure why not, we called pharmacy and he has not picked it up in a while.  He is on Lantus 50 units  Refilled meds, will call Dr George HughEllison's office after i get labs to see if he needs to be on metformin once Creatinine returns and before his next OV.  He has difficulty making appt to endocrinology, would like referral Fu prn    Gross sideeffects, risk and benefits, and alternatives of medications d/w  patient. Patient is aware that all medications have potential sideeffects and we are unable to predict every sideeffect or drug-drug interaction that may occur.  Hamilton Capri DO  03/07/2015 10:01 AM   Spoke with Dr Herminio Commons would like patient to come in to dw patient labs and any necessary adjustment, will do it himself  Spoke with patient about labs and how to take thyroid medication and can recheck on next apt with Dr Everardo All, I went ahead and referred him to cardiology as well based on his PMH and complaints and radiology results.

## 2015-03-05 NOTE — Patient Instructions (Signed)
Gabapentin capsules or tablets What is this medicine? GABAPENTIN (GA ba pen tin) is used to control partial seizures in adults with epilepsy. It is also used to treat certain types of nerve pain. This medicine may be used for other purposes; ask your health care provider or pharmacist if you have questions. COMMON BRAND NAME(S): Orpha Bur, Neurontin What should I tell my health care provider before I take this medicine? They need to know if you have any of these conditions: -kidney disease -suicidal thoughts, plans, or attempt; a previous suicide attempt by you or a family member -an unusual or allergic reaction to gabapentin, other medicines, foods, dyes, or preservatives -pregnant or trying to get pregnant -breast-feeding How should I use this medicine? Take this medicine by mouth with a glass of water. Follow the directions on the prescription label. You can take it with or without food. If it upsets your stomach, take it with food.Take your medicine at regular intervals. Do not take it more often than directed. Do not stop taking except on your doctor's advice. If you are directed to break the 600 or 800 mg tablets in half as part of your dose, the extra half tablet should be used for the next dose. If you have not used the extra half tablet within 28 days, it should be thrown away. A special MedGuide will be given to you by the pharmacist with each prescription and refill. Be sure to read this information carefully each time. Talk to your pediatrician regarding the use of this medicine in children. Special care may be needed. Overdosage: If you think you have taken too much of this medicine contact a poison control center or emergency room at once. NOTE: This medicine is only for you. Do not share this medicine with others. What if I miss a dose? If you miss a dose, take it as soon as you can. If it is almost time for your next dose, take only that dose. Do not take double or extra  doses. What may interact with this medicine? Do not take this medicine with any of the following medications: -other gabapentin products This medicine may also interact with the following medications: -alcohol -antacids -antihistamines for allergy, cough and cold -certain medicines for anxiety or sleep -certain medicines for depression or psychotic disturbances -homatropine; hydrocodone -naproxen -narcotic medicines (opiates) for pain -phenothiazines like chlorpromazine, mesoridazine, prochlorperazine, thioridazine This list may not describe all possible interactions. Give your health care provider a list of all the medicines, herbs, non-prescription drugs, or dietary supplements you use. Also tell them if you smoke, drink alcohol, or use illegal drugs. Some items may interact with your medicine. What should I watch for while using this medicine? Visit your doctor or health care professional for regular checks on your progress. You may want to keep a record at home of how you feel your condition is responding to treatment. You may want to share this information with your doctor or health care professional at each visit. You should contact your doctor or health care professional if your seizures get worse or if you have any new types of seizures. Do not stop taking this medicine or any of your seizure medicines unless instructed by your doctor or health care professional. Stopping your medicine suddenly can increase your seizures or their severity. Wear a medical identification bracelet or chain if you are taking this medicine for seizures, and carry a card that lists all your medications. You may get drowsy, dizzy, or have blurred  vision. Do not drive, use machinery, or do anything that needs mental alertness until you know how this medicine affects you. To reduce dizzy or fainting spells, do not sit or stand up quickly, especially if you are an older patient. Alcohol can increase drowsiness and  dizziness. Avoid alcoholic drinks. Your mouth may get dry. Chewing sugarless gum or sucking hard candy, and drinking plenty of water will help. The use of this medicine may increase the chance of suicidal thoughts or actions. Pay special attention to how you are responding while on this medicine. Any worsening of mood, or thoughts of suicide or dying should be reported to your health care professional right away. Women who become pregnant while using this medicine may enroll in the [place] Pregnancy Registry by calling [phone]. This registry collects information about the safety of antiepileptic drug use during pregnancy. What side effects may I notice from receiving this medicine? Side effects that you should report to your doctor or health care professional as soon as possible: -allergic reactions like skin rash, itching or hives, swelling of the face, lips, or tongue -worsening of mood, thoughts or actions of suicide or dying Side effects that usually do not require medical attention (report to your doctor or health care professional if they continue or are bothersome): -constipation -difficulty walking or controlling muscle movements -dizziness -nausea -slurred speech -tiredness -tremors -weight gain This list may not describe all possible side effects. Call your doctor for medical advice about side effects. You may report side effects to FDA at 1-800-FDA-1088. Where should I keep my medicine? Keep out of reach of children. Store at room temperature between 15 and 30 degrees C (59 and 86 degrees F). Throw away any unused medicine after the expiration date. NOTE: This sheet is a summary. It may not cover all possible information. If you have questions about this medicine, talk to your doctor, pharmacist, or health care provider.  2015, Elsevier/Gold Standard. (2013-04-05 09:12:48)    Diabetes and Standards of Medical Care Diabetes is complicated. You  may find that your diabetes team includes a dietitian, nurse, diabetes educator, eye doctor, and more. To help everyone know what is going on and to help you get the care you deserve, the following schedule of care was developed to help keep you on track. Below are the tests, exams, vaccines, medicines, education, and plans you will need. HbA1c test This test shows how well you have controlled your glucose over the past 2-3 months. It is used to see if your diabetes management plan needs to be adjusted.   It is performed at least 2 times a year if you are meeting treatment goals.  It is performed 4 times a year if therapy has changed or if you are not meeting treatment goals. Blood pressure test  This test is performed at every routine medical visit. The goal is less than 140/90 mm Hg for most people, but 130/80 mm Hg in some cases. Ask your health care provider about your goal. Dental exam  Follow up with the dentist regularly. Eye exam  If you are diagnosed with type 1 diabetes as a child, get an exam upon reaching the age of 18 years or older and have had diabetes for 3-5 years. Yearly eye exams are recommended after that initial eye exam.  If you are diagnosed with type 1 diabetes as an adult, get an exam within 5 years of diagnosis and then yearly.  If you are diagnosed with type  2 diabetes, get an exam as soon as possible after the diagnosis and then yearly. Foot care exam  Visual foot exams are performed at every routine medical visit. The exams check for cuts, injuries, or other problems with the feet.  A comprehensive foot exam should be done yearly. This includes visual inspection as well as assessing foot pulses and testing for loss of sensation.  Check your feet nightly for cuts, injuries, or other problems with your feet. Tell your health care provider if anything is not healing. Kidney function test (urine microalbumin)  This test is performed once a year.  Type 1  diabetes: The first test is performed 5 years after diagnosis.  Type 2 diabetes: The first test is performed at the time of diagnosis.  A serum creatinine and estimated glomerular filtration rate (eGFR) test is done once a year to assess the level of chronic kidney disease (CKD), if present. Lipid profile (cholesterol, HDL, LDL, triglycerides)  Performed every 5 years for most people.  The goal for LDL is less than 100 mg/dL. If you are at high risk, the goal is less than 70 mg/dL.  The goal for HDL is 40 mg/dL-50 mg/dL for men and 50 mg/dL-60 mg/dL for women. An HDL cholesterol of 60 mg/dL or higher gives some protection against heart disease.  The goal for triglycerides is less than 150 mg/dL. Influenza vaccine, pneumococcal vaccine, and hepatitis B vaccine  The influenza vaccine is recommended yearly.  It is recommended that people with diabetes who are over 30 years old get the pneumonia vaccine. In some cases, two separate shots may be given. Ask your health care provider if your pneumonia vaccination is up to date.  The hepatitis B vaccine is also recommended for adults with diabetes. Diabetes self-management education  Education is recommended at diagnosis and ongoing as needed. Treatment plan  Your treatment plan is reviewed at every medical visit. Document Released: 05/30/2009 Document Revised: 12/17/2013 Document Reviewed: 01/02/2013 [facility] Patient Information 2015 [place], [place]. This information is not intended to replace advice given to you by your health care provider. Make sure you discuss any questions you have with your health care provider.

## 2015-03-06 LAB — COMPLETE METABOLIC PANEL WITH GFR
AST: 29 U/L (ref 0–37)
Alkaline Phosphatase: 72 U/L (ref 39–117)
BUN: 15 mg/dL (ref 6–23)
Calcium: 9.6 mg/dL (ref 8.4–10.5)
Chloride: 99 mEq/L (ref 96–112)
Creat: 0.65 mg/dL (ref 0.50–1.35)
GFR, Est Non African American: >89 mL/min
Glucose, Bld: 218 mg/dL — ABNORMAL HIGH (ref 70–99)
Total Bilirubin: 0.5 mg/dL (ref 0.2–1.2)
Total Protein: 6.8 g/dL (ref 6.0–8.3)

## 2015-03-06 LAB — COMPLETE METABOLIC PANEL WITHOUT GFR
ALT: 32 U/L (ref 0–53)
Albumin: 4.1 g/dL (ref 3.5–5.2)
CO2: 25 meq/L (ref 19–32)
GFR, Est African American: >89 mL/min
Potassium: 4.6 meq/L (ref 3.5–5.3)
Sodium: 139 meq/L (ref 135–145)

## 2015-03-06 LAB — MICROALBUMIN, URINE: Microalb, Ur: 31.6 mg/dL — ABNORMAL HIGH (ref <2.0)

## 2015-03-06 LAB — TSH: TSH: 9.512 u[IU]/mL — ABNORMAL HIGH (ref 0.350–4.500)

## 2015-03-07 ENCOUNTER — Telehealth: Payer: Self-pay | Admitting: Endocrinology

## 2015-03-07 NOTE — Telephone Encounter (Signed)
You had thyroid checked 2 days ago, and it was just a little low.  Do you ever miss the thyroid pill?  If so, please take as rx'ed.  If so, please let us know, so we can increase dosage.  If you want to come back in 1 month, please make appt now, so we can get you in.

## 2015-03-07 NOTE — Telephone Encounter (Signed)
Pt stated he takes his medication faithfully, but had not been taking it on a empty stomach. Pt has made an appointment for 8/29 and his lab visit has been made for 8/24.

## 2015-03-07 NOTE — Telephone Encounter (Signed)
I contacted the pt. He stated he wanted to wait one month before coming. Pt stated he would like his blood test done before coming in for his office visit so the levels can be checked.. Pt would like these orders to be placed. Pt is under the impression his thyroid labs are going to be checked.

## 2015-03-07 NOTE — Telephone Encounter (Signed)
i was called by urgent care, where he was seen  please call patient: Needs f/u ov here, next avail appt

## 2015-03-14 ENCOUNTER — Ambulatory Visit (INDEPENDENT_AMBULATORY_CARE_PROVIDER_SITE_OTHER): Payer: BC Managed Care – PPO | Admitting: Pulmonary Disease

## 2015-03-14 ENCOUNTER — Encounter: Payer: Self-pay | Admitting: Pulmonary Disease

## 2015-03-14 ENCOUNTER — Other Ambulatory Visit (INDEPENDENT_AMBULATORY_CARE_PROVIDER_SITE_OTHER): Payer: BC Managed Care – PPO

## 2015-03-14 ENCOUNTER — Other Ambulatory Visit: Payer: Self-pay | Admitting: Endocrinology

## 2015-03-14 VITALS — BP 156/82 | HR 107 | Temp 98.0°F | Ht 75.0 in | Wt 395.2 lb

## 2015-03-14 DIAGNOSIS — R609 Edema, unspecified: Secondary | ICD-10-CM

## 2015-03-14 DIAGNOSIS — R0600 Dyspnea, unspecified: Secondary | ICD-10-CM | POA: Diagnosis not present

## 2015-03-14 DIAGNOSIS — G4733 Obstructive sleep apnea (adult) (pediatric): Secondary | ICD-10-CM | POA: Diagnosis not present

## 2015-03-14 DIAGNOSIS — Z9989 Dependence on other enabling machines and devices: Secondary | ICD-10-CM

## 2015-03-14 LAB — BRAIN NATRIURETIC PEPTIDE: Pro B Natriuretic peptide (BNP): 10 pg/mL (ref 0.0–100.0)

## 2015-03-14 NOTE — Patient Instructions (Signed)
1. We are going to repeat her CPAP titration to ensure that the pressure is appropriate for your device as she will likely require a new CPAP device. 2. I'm checking a CT scan of her chest to get a better picture of your lungs to determine if there is any true scar tissue. 3. We are scheduling you for a walking and breathing test to determine how well your lungs are functioning and to make sure you are not having low oxygen contributing to your shortness of breath. 4. Please avoid eating within 2-3 hours of bedtime and remember to take Zantac (ranitidine - generic) 150 mg before bedtime to help prevent any reflux that could be contributing to your throat sensation. 5. I am checking a blood test today to determine if you're having buildup of fluid from your heart. If this is abnormal we will need to check an ultrasound of your heart. 6. You will return to clinic in 4-8 weeks to review your test results but please contact me if you have any further questions or concerns.

## 2015-03-14 NOTE — Progress Notes (Signed)
Subjective:    Patient ID: Johnny Andrews, male    DOB: May 16, 1961, 54 y.o.   MRN: [id number]  HPI He reports historically he has been very active playing sports and running marathons. He denies any history of breathing problems. He reports that he usually only gets "bronchitis" in the Spring that is usually treated with Azithromycin. He reports previously he was evaluated by an Allergist and diagnosed with a dust mite allergy. He feels his dyspnea on exertion is attributable to his weight gain and lack of activity. He reports he has dyspnea after walking only 100 ft or 1 flight of stairs but doesn't have to stop. He reports he was diagnosed with sleep apnea about 15 years ago and has been on CPAP therapy. He has a nonproductive cough but more reports he has to clear his throat frequently feeling that there's something in the back of his throat. He has had intermittent wheezing particularly if he wakes up in the middle of the night. He denies any nocturnal awakenings with wheezing or dyspnea but will sometimes wake up coughing. He does admit he still dozes off easily. He denies any reflux, dyspepsia, morning brash water taste, dysphagia or odynophagia. He denies any rashes. No joint swelling but does have knee pain bilaterally. He denies any joint stiffness. He reports he is having some loss of sensation in his feet bilaterally.   Review of Systems No fever or chills. He does sweat easily. He denies any dysuria or hematuria. He denies any urinary hesitancy. He does get up on average 4 times nightly to urinate. A pertinent 14 point review of systems is negative except as per the history of presenting illness.  No Known Allergies  Current Outpatient Prescriptions on File Prior to Visit  Medication Sig Dispense Refill  . albuterol (PROVENTIL HFA;VENTOLIN HFA) 108 (90 BASE) MCG/ACT inhaler Inhale 2 puffs into the lungs every 4 (four) hours as needed for wheezing or shortness of breath (cough, shortness  of breath or wheezing.). 1 Inhaler 12  . diclofenac (VOLTAREN) 75 MG EC tablet TAKE 1 TABLET BY MOUTH TWICE A DAY AS NEEDED FOR PAIN 180 tablet 0  . fenofibrate micronized (LOFIBRA) 134 MG capsule TAKE 1 CAPSULE (134 MG TOTAL) BY MOUTH DAILY BEFORE BREAKFAST. 30 capsule 3  . furosemide (LASIX) 20 MG tablet Take 1 tablet (20 mg total) by mouth as needed for edema. 20 tablet 0  . gabapentin (NEURONTIN) 100 MG capsule Take 1 capsule (100 mg total) by mouth at bedtime. 30 capsule 0  . glucose blood (ONE TOUCH ULTRA TEST) test strip Two times a day dx 250.01     . Insulin Glargine (LANTUS SOLOSTAR) 100 UNIT/ML Solostar Pen Inject 500 Units into the skin every morning. And pen needles 3/day 60 pen PRN  . Insulin Pen Needle (BD ULTRA-FINE PEN NEEDLES) 29G X 12.7MM MISC Apply 200 application topically 2 (two) times daily. 200 each 3  . levothyroxine (SYNTHROID, LEVOTHROID) 125 MCG tablet 2 tabs daily 60 tablet 11  . metoprolol (LOPRESSOR) 50 MG tablet TAKE 1 TABLET BY MOUTH TWICE A DAY 60 tablet 5  . simvastatin (ZOCOR) 40 MG tablet Take 0.5 tablets (20 mg total) by mouth at bedtime. 30 tablet 11  . triamcinolone (NASACORT AQ) 55 MCG/ACT AERO nasal inhaler Place 2 sprays into the nose daily. (Patient taking differently: Place 2 sprays into the nose as needed. ) 1 Inhaler 12  . Respiratory Therapy Supplies (ADULT MASK) MISC 2 Devices by Does not apply  route once. 2 each 0  . sildenafil (VIAGRA) 100 MG tablet Take 0.5-1 tablets (50-100 mg total) by mouth daily as needed for erectile dysfunction. (Patient not taking: Reported on 08/20/2014) 5 tablet 3   No current facility-administered medications on file prior to visit.   Past Medical History  Diagnosis Date  . HYPOTHYROIDISM 09/19/2007  . DIABETES MELLITUS, TYPE II 09/19/2007  . HYPERLIPIDEMIA 09/19/2007  . GOUT 04/17/2010  . ANXIETY 09/19/2007  . ALCOHOL USE 10/24/2008  . SMOKER 04/17/2010  . HYPERTENSION 09/19/2007  . External thrombosed hemorrhoids 08/23/2008  .  OTHER DISEASES OF LUNG NOT ELSEWHERE CLASSIFIED 07/20/2010  . SCROTAL ABSCESS 05/27/2010  . Sebaceous cyst 08/01/2008  . OSTEOARTHRITIS 09/19/2007  . SLEEP APNEA 08/22/2009  . Rash and other nonspecific skin eruption 09/19/2007  . Cough 05/22/2008   Past Surgical History  Procedure Laterality Date  . Nasal septum surgery    . Colonoscopy     Family History  Problem Relation Age of Onset  . Breast cancer Mother     Breast Cancer  . Heart disease Father   . Lung disease Neg Hx   . Autoimmune disease Neg Hx    History   Social History  . Marital Status: Married    Spouse Name: N/A  . Number of Children: N/A  . Years of Education: N/A   Occupational History  . [employer]   Social History Main Topics  . Smoking status: Former Smoker -- 0.50 packs/day for 30 years    Quit date: 11/05/2012  . Smokeless tobacco: Never Used     Comment: 30 years at most off and on smoking 03/14/15  . Alcohol Use: 0.0 oz/week    0 Standard drinks or equivalent per week     Comment: 6 beers a day  . Drug Use: Yes     Comment: Remote Marijuana  . Sexual Activity: Not on file   Other Topics Concern  . None   Social History Narrative   Originally from [place]. Has lived in [place], [place], [place], & [place] since 1993. 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 Currently has a dog & cats. Previously had a parrot (conure) 12 years ago for 1 year but in same house.  Has also had excessive exposure to pigeon feces. He has had very brief exposure to asbestos around 2000. No hot tub exposure. He has inhaled chemical fumes/gases/refridgerants through his work.      Objective:   Physical Exam Blood pressure 156/82, pulse 107, temperature 98 F (36.7 C), temperature source Oral, height 6\' 3"  (1.905 m), weight 395 lb 3.2 oz (179.262 kg), SpO2 96 %. General:  Morbidly obese Caucasian male. Awake. Alert. No acute distress.  Integument:  Warm & dry. No rash on exposed skin. No  bruising. Lymphatics:  No appreciated cervical or supraclavicular lymphadenoapthy. HEENT:  Moist mucus membranes. No oral ulcers. No scleral injection or icterus. Minimal nasal turbinate swelling bilaterally. Mallampati class IV. PERRL. Cardiovascular:  Regular rate. 1-2+ pitting bilateral lower extremity edema. Unable to appreciate JVD given body habitus.  Pulmonary:  Mild bilateral basilar crackles. Symmetric chest wall expansion. No accessory muscle use. Abdomen: Soft. Normal bowel sounds. Protuberant. Grossly nontender. Ventral hernia noted. Musculoskeletal:  Normal bulk and tone. Hand grip strength 5/5 bilaterally. No joint deformity or effusion appreciated. Neurological:  CN 2-12 grossly in tact. No meningismus. Moving all 4 extremities equally. Symmetric patellar deep tendon reflexes. Psychiatric:  Mood and affect congruent. Speech normal rhythm, rate & tone.   PFT 10/09/13:  FVC 3.45 L (61%) FEV1 2.84 L (64%) FEV1/FVC 0.82 FEF 25-75 2.80 L (69%)  IMAGING CXR PA/LAT 03/05/15 (personally reviewed by me): Mildly prominent bilateral interstitial markings. No cardiomegaly or pleural effusion appreciated. No parenchymal mass or consolidation appreciated.  LABS 03/05/15 BMP: 139/4.6/99/25/15/0.65/218/9.6 LFT: 4.1/6.8/0.5/72/29/32 CBC: 7.8/15.6/46.8/247 TSH: 9.51 to    Assessment & Plan:  The patient is a 54 year old male who is morbidly obese. He was diagnosed with sleep apnea 15 years ago and has used same device all this time. With his weight gain and frequent dosing I do not feel that his current pressure setting is sufficient and a repeat titration is warranted. I personally reviewed his chest x-ray which does show some prominent interstitial markings that could be related to some pulmonary edema given the lower extremity edema I'm seeing on physical exam today. The patient has never had a transthoracic echocardiogram and I am screening him first with a serum test. Certainly his morbid  obesity could be contributing to his dyspnea through physical deconditioning. However, he does have prior exposure to a parrot in the same house which she is living now as well as to asbestos. In further discussing the abnormal sensation in his retropharynx this could quite probably due to silent laryngo-esophageal reflux as he is consuming food and alcohol immediately prior to going to bed. I instructed the patient to contact my office if you have any further questions or concerns before his next appointment.  1. Dyspnea: Likely multifactorial. Checking full pulmonary function testing & 6 minute walk test before next appointment. 2. OSA on CPAP: I have given the patient a prescription for CPAP supplies. We are ordering a repeat CPAP titration study. 3. Edema: Checking serum BNP. If this is abnormal I will plan for transthoracic echocardiogram with contrast. 4. 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 5. 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 6. Morbid obesity: We did discuss his need for exercise and dietary restriction to help him lose weight to improve not only his pulmonary function but also his longevity. 7. Follow-up: Patient to return to clinic in 4-8 weeks or sooner if needed.

## 2015-03-19 ENCOUNTER — Encounter: Payer: Self-pay | Admitting: Internal Medicine

## 2015-03-19 ENCOUNTER — Encounter: Payer: Self-pay | Admitting: Pulmonary Disease

## 2015-03-20 ENCOUNTER — Encounter: Payer: Self-pay | Admitting: Family Medicine

## 2015-03-21 ENCOUNTER — Ambulatory Visit (HOSPITAL_COMMUNITY): Payer: BC Managed Care – PPO

## 2015-03-25 ENCOUNTER — Ambulatory Visit (INDEPENDENT_AMBULATORY_CARE_PROVIDER_SITE_OTHER)
Admission: RE | Admit: 2015-03-25 | Discharge: 2015-03-25 | Disposition: A | Discharge status: Home / Self Care | Payer: BC Managed Care – PPO | Source: Ambulatory Visit | Attending: Pulmonary Disease | Admitting: Pulmonary Disease

## 2015-03-25 DIAGNOSIS — R0600 Dyspnea, unspecified: Secondary | ICD-10-CM | POA: Diagnosis not present

## 2015-03-26 ENCOUNTER — Telehealth: Payer: Self-pay | Admitting: *Deleted

## 2015-03-26 NOTE — Telephone Encounter (Signed)
Pt reports he is not able to afford the CPAP titration study. He found a CPAP machine online that he can buy that has an auto setting. He thinks this will work out better for him. Please advise Dr. Jamison Neighbor thanks

## 2015-03-26 NOTE — Telephone Encounter (Signed)
[name].  Please print out a Rx for the following CPAP settings, etc. Below. Johnny Andrews will pick it up at the front desk. Thanks.  Auto-titrating CPAP Pressure:  5-14 cm H20 Heated Humidification CPAP download after 30 days  Diagnosis:  OSA

## 2015-03-27 NOTE — Telephone Encounter (Signed)
Will have Dr. Jamison Neighbor sign this when he comes in this afternoon

## 2015-03-27 NOTE — Telephone Encounter (Signed)
RX signed and placed for pick up. Nothing further needed

## 2015-03-30 ENCOUNTER — Other Ambulatory Visit: Payer: Self-pay | Admitting: Family Medicine

## 2015-04-01 NOTE — Telephone Encounter (Signed)
Dr Conley Rolls, do you want to put RFs on Lasix? Checked Referral notes when I saw you referred pt to cardiologist and pt declined visit at this time, telling cardiologist he would call back to reschedule after talking with his PCP and his review of EKG.

## 2015-04-09 ENCOUNTER — Other Ambulatory Visit: Payer: BC Managed Care – PPO

## 2015-04-14 ENCOUNTER — Telehealth: Payer: Self-pay

## 2015-04-14 ENCOUNTER — Ambulatory Visit: Payer: BC Managed Care – PPO | Admitting: Endocrinology

## 2015-04-14 NOTE — Telephone Encounter (Signed)
Pt is longer a pt here in the office.He is seeing another Endocrinologist for a second opinion.

## 2015-04-16 ENCOUNTER — Telehealth: Payer: Self-pay

## 2015-04-16 NOTE — Telephone Encounter (Signed)
Pt will have B/P rechecked on upcoming f/u visit 09/19

## 2015-05-02 LAB — PULMONARY FUNCTION TEST
DL/VA % pred: 117 %
DL/VA: 5.8 ml/min/mmHg/L
DLCO UNC % PRED: 90 %
DLCO UNC: 36.39 ml/min/mmHg
FEF 25-75 PRE: 3.63 L/s
FEF 25-75 Post: 4.64 L/sec
FEF2575-%CHANGE-POST: 27 %
FEF2575-%PRED-POST: 120 %
FEF2575-%PRED-PRE: 94 %
FEV1-%Change-Post: 3 %
FEV1-%PRED-POST: 72 %
FEV1-%Pred-Pre: 70 %
FEV1-POST: 3.32 L
FEV1-Pre: 3.22 L
FEV1FVC-%CHANGE-POST: 3 %
FEV1FVC-%PRED-PRE: 109 %
FEV6-%CHANGE-POST: 0 %
FEV6-%PRED-POST: 65 %
FEV6-%Pred-Pre: 65 %
FEV6-PRE: 3.8 L
FEV6-Post: 3.81 L
FEV6FVC-%CHANGE-POST: 0 %
FEV6FVC-%PRED-PRE: 103 %
FEV6FVC-%Pred-Post: 104 %
FVC-%CHANGE-POST: 0 %
FVC-%PRED-POST: 63 %
FVC-%Pred-Pre: 63 %
FVC-Post: 3.81 L
FVC-Pre: 3.82 L
POST FEV1/FVC RATIO: 87 %
PRE FEV1/FVC RATIO: 84 %
Post FEV6/FVC ratio: 100 %
Pre FEV6/FVC Ratio: 100 %
RV % PRED: 91 %
RV: 2.25 L
TLC % pred: 76 %
TLC: 6.29 L

## 2015-05-05 ENCOUNTER — Ambulatory Visit (INDEPENDENT_AMBULATORY_CARE_PROVIDER_SITE_OTHER): Payer: BC Managed Care – PPO | Admitting: Pulmonary Disease

## 2015-05-05 ENCOUNTER — Encounter: Payer: Self-pay | Admitting: Pulmonary Disease

## 2015-05-05 VITALS — BP 142/78 | HR 116 | Ht 76.0 in | Wt 394.0 lb

## 2015-05-05 DIAGNOSIS — J984 Other disorders of lung: Secondary | ICD-10-CM

## 2015-05-05 DIAGNOSIS — Z9989 Dependence on other enabling machines and devices: Secondary | ICD-10-CM

## 2015-05-05 DIAGNOSIS — R0600 Dyspnea, unspecified: Secondary | ICD-10-CM

## 2015-05-05 DIAGNOSIS — R0609 Other forms of dyspnea: Secondary | ICD-10-CM

## 2015-05-05 DIAGNOSIS — Z77090 Contact with and (suspected) exposure to asbestos: Secondary | ICD-10-CM

## 2015-05-05 DIAGNOSIS — G4733 Obstructive sleep apnea (adult) (pediatric): Secondary | ICD-10-CM | POA: Diagnosis not present

## 2015-05-05 DIAGNOSIS — K219 Gastro-esophageal reflux disease without esophagitis: Secondary | ICD-10-CM | POA: Diagnosis not present

## 2015-05-05 NOTE — Patient Instructions (Signed)
1. Please try aggressively to lose weight. 2. You can try taking Zantac 300 mg before bedtime to see if this helps her cough. 3. Please make sure I get a download of your new CPAP machine after you've been using it for 4 weeks. 4. I will see back in 3 months or sooner if needed.

## 2015-05-05 NOTE — Progress Notes (Signed)
PFT done today. 

## 2015-05-05 NOTE — Progress Notes (Signed)
Subjective:    Patient ID: Johnny Andrews, male    DOB: 1961/07/11, 54 y.o.   MRN: [id number]  C.C.:  Follow-up for Mild Restrictive Lung Disease, OSA, Asbestos Exposure, Silent Laryngoesophageal Reflux, & Morbid Obesity.  HPI Mild Restrictive Lung Disease:  Likely secondary to morbid obesity. No evidence of ILD on CT imaging. Continues to have dyspnea on exertion that limits his functional abilities. He admits he is not yet exercising. He continues to have frequent coughing & is productive at times.  OSA:  Patient continues to use his old CPAP machine. He is planning to purchase a new CPAP once he has enough money. He has switched to a new nasal mask that he doesn't think works as well. He reports his new machine will have a default pressure set at 11. He has been reluctant to go through a new titration study due to the cost. On 9cm H2O he doesn't feel well rested in the morning. He does drink alcohol at night before he sleeps. He goes to sleep at 9:30pm & wakes up around 1-2am spontaneously and feels "alert" with racing thoughts.;  Asbestos Exposure: No evidence of pleural or parenchymal disease on CT imaging.   Silent Laryngoesophageal Reflux: Started on empiric treatment with Zantac at last appointment. He denies any reflux, dyspepsia, or morning brash water taste.   Morbid Obesity:  He reports he has been continuing to have trouble losing weight. He has started seeing a new endocrinologist. He is trying to restrict carbohydrates from his diet.  Review of Systems He denies any chest pain or pressure. He does feel the lower extremity edema has improved. No fever or chills. Has chronic sweats.  No Known Allergies  Current Outpatient Prescriptions on File Prior to Visit  Medication Sig Dispense Refill  . albuterol (PROVENTIL HFA;VENTOLIN HFA) 108 (90 BASE) MCG/ACT inhaler Inhale 2 puffs into the lungs every 4 (four) hours as needed for wheezing or shortness of breath (cough, shortness of  breath or wheezing.). 1 Inhaler 12  . diclofenac (VOLTAREN) 75 MG EC tablet TAKE 1 TABLET BY MOUTH TWICE A DAY AS NEEDED FOR PAIN 60 tablet 0  . fenofibrate micronized (LOFIBRA) 134 MG capsule TAKE 1 CAPSULE (134 MG TOTAL) BY MOUTH DAILY BEFORE BREAKFAST. 30 capsule 3  . glucose blood (ONE TOUCH ULTRA TEST) test strip Two times a day dx 250.01     . Insulin Pen Needle (BD ULTRA-FINE PEN NEEDLES) 29G X 12.7MM MISC Apply 200 application topically 2 (two) times daily. 200 each 3  . levothyroxine (SYNTHROID, LEVOTHROID) 125 MCG tablet 2 tabs daily 60 tablet 11  . metoprolol (LOPRESSOR) 50 MG tablet TAKE 1 TABLET BY MOUTH TWICE A DAY 60 tablet 5  . Respiratory Therapy Supplies (ADULT MASK) MISC 2 Devices by Does not apply route once. 2 each 0  . simvastatin (ZOCOR) 40 MG tablet Take 0.5 tablets (20 mg total) by mouth at bedtime. 30 tablet 11   No current facility-administered medications on file prior to visit.   Past Medical History  Diagnosis Date  . HYPOTHYROIDISM 09/19/2007  . DIABETES MELLITUS, TYPE II 09/19/2007  . HYPERLIPIDEMIA 09/19/2007  . GOUT 04/17/2010  . ANXIETY 09/19/2007  . ALCOHOL USE 10/24/2008  . SMOKER 04/17/2010  . HYPERTENSION 09/19/2007  . External thrombosed hemorrhoids 08/23/2008  . OTHER DISEASES OF LUNG NOT ELSEWHERE CLASSIFIED 07/20/2010  . SCROTAL ABSCESS 05/27/2010  . Sebaceous cyst 08/01/2008  . OSTEOARTHRITIS 09/19/2007  . SLEEP APNEA 08/22/2009  . Rash and other  nonspecific skin eruption 09/19/2007  . Cough 05/22/2008   Past Surgical History  Procedure Laterality Date  . Nasal septum surgery    . Colonoscopy     Family History  Problem Relation Age of Onset  . Breast cancer Mother     Breast Cancer  . Heart disease Father   . Lung disease Neg Hx   . Autoimmune disease Neg Hx    Social History   Social History  . Marital Status: Married    Spouse Name: N/A  . Number of Children: N/A  . Years of Education: N/A   Occupational History  . [employer]   Social  History Main Topics  . Smoking status: Former Smoker -- 0.50 packs/day for 30 years    Quit date: 11/05/2012  . Smokeless tobacco: Never Used     Comment: 30 years at most off and on smoking 03/14/15  . Alcohol Use: 0.0 oz/week    0 Standard drinks or equivalent per week     Comment: 6 beers a day  . Drug Use: Yes     Comment: Remote Marijuana  . Sexual Activity: Not Asked   Other Topics Concern  . None   Social History Narrative   Originally from [place]. Has lived in [place], [place], [place], & [place] since 1993. 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 Currently has a dog & cats. Previously had a parrot (conure) 12 years ago for 1 year but in same house.  Has also had excessive exposure to pigeon feces. He has had very brief exposure to asbestos around 2000. No hot tub exposure. He has inhaled chemical fumes/gases/refridgerants through his work.      Objective:   Physical Exam Blood pressure 142/78, pulse 116, height  (1.93 m), weight 394 lb (178.717 kg), SpO2 94 %. General:  Morbidly obese Caucasian male. Awake. Alert.  Integument:  Warm & dry. No rash on exposed skin.  HEENT:  Moist mucus membranes. No oral ulcers. No scleral injection or icterus. Minimal nasal turbinate swelling bilaterally.  Cardiovascular:  Regular rate & rhythm. Persistent pitting lower extremity edema.  Pulmonary:  Good aeration & clear to auscultation bilaterally. Speaking in complete sentences. Abdomen: Soft. Normal bowel sounds. Protuberant. Nontender. Neurological:  CN 2-12 grossly in tact. No meningismus. Symmetric patellar deep tendon reflexes.  PFT 05/05/15: FVC 3.82 L (63%) FEV1 3.22 L (70%) FEV1/FVC 0.84 FEF 25-75 3.63 L (94%) no bronchodilator response TLC 6.29 L (76%) RV 91% ERV 6% DLCO uncorrected 90% 10/09/13: FVC 3.45 L (61%) FEV1 2.84 L (64%) FEV1/FVC 0.82 FEF 25-75 2.80 L (69%)  05/05/15:  Walked 342 meters / Baseline Sat 93% on RA / Nadir  Sat 93% on RA  IMAGING CT CHEST W/O 03/25/15 (personally reviewed by me): Dependent platelike atelectasis bilateral lower lung zones. No nodule or opacity appreciated. No pleural effusion or thickening. No pathologic mediastinal adenopathy. No pericardial effusion. No suggestion of intralobular septal thickening or honeycomb changes. Radiology comment on bronchiectasis particularly in the dependent lower lung zones but I do not appreciate this. If anything there is borderline bronchiectasis. Azygous lobe is present. Enlarged fatty infiltration of the liver is suggested.  CXR PA/LAT 03/05/15 (previously reviewed by me): Mildly prominent bilateral interstitial markings. No cardiomegaly or pleural effusion appreciated. No parenchymal mass or consolidation appreciated.  LABS 03/14/15 BNP:  10.0  03/05/15 BMP: 139/4.6/99/25/15/0.65/218/9.6 LFT: 4.1/6.8/0.5/72/29/32 CBC: 7.8/15.6/46.8/247 TSH: 9.51 to  Assessment & Plan:  54 year old male with morbid obesity and mild restrictive lung disease likely secondary to his obesity. I reviewed his chest CT scan with him today which shows no evidence of parenchymal disease other than for some mild scarring & atelectasis. There is no suggestion of interstitial lung disease or evidence of asbestosis or pleural disease secondary to asbestos exposure. We did discuss using an increased dose of Zantac to help with his intermittent coughing. We also discussed his morbid obesity at length and the need for continued weight loss to help improve his control of diabetes as well as his underlying sleep apnea. The patient plans on obtaining a new CPAP device and I requested that he supply my office with a download of the device after he has been using it for approximately 4 months. He will contact my office for any new breathing problems before next appointment.  1. Mild restrictive lung disease: Secondary to obesity. No need for further testing. 2. OSA: Patient continuing on  his current home CPAP. He plans on obtaining a new CPAP through an on line source. I requested that he provide my office if download after he has been using his new device for approximately 4 weeks. 3. Asbestos exposure: No evidence of pleural or parenchymal disease. Plan for yearly chest x-ray PA/LAT & for pulmonary function test. 4. Cough/GERD/silent laryngo-esophageal reflux: Recommended the patient try using 300 mg of Zantac at night before bed. Previously counseled him on avoiding alcohol of the evening as well as eating within 3 hours of bedtime. 5. Morbid obesity: Discussed the need for weight loss and exercise at length with the patient. He is going to try to limit carbohydrates in his diet. 6. Follow-up: Patient to return to clinic in 3 months or sooner if needed.

## 2015-05-06 NOTE — Progress Notes (Signed)
PFT 05/05/15: FVC 3.82 L (63%) FEV1 3.22 L (70%) FEV1/FVC 0.84 FEF 25-75 3.63 L (94%) no bronchodilator response TLC 6.29 L (76%) RV 91% ERV 6% DLCO uncorrected 90%  05/05/15: Walked 342 meters / Baseline Sat 93% on RA / Nadir Sat 93% on RA

## 2015-06-04 ENCOUNTER — Encounter (HOSPITAL_BASED_OUTPATIENT_CLINIC_OR_DEPARTMENT_OTHER): Payer: BC Managed Care – PPO

## 2015-09-01 ENCOUNTER — Ambulatory Visit (INDEPENDENT_AMBULATORY_CARE_PROVIDER_SITE_OTHER): Payer: BC Managed Care – PPO | Admitting: Family Medicine

## 2015-09-01 VITALS — BP 142/80 | HR 101 | Temp 98.1°F | Resp 18 | Ht 75.0 in | Wt 388.0 lb

## 2015-09-01 DIAGNOSIS — M791 Myalgia: Secondary | ICD-10-CM | POA: Diagnosis not present

## 2015-09-01 DIAGNOSIS — E1140 Type 2 diabetes mellitus with diabetic neuropathy, unspecified: Secondary | ICD-10-CM | POA: Diagnosis not present

## 2015-09-01 DIAGNOSIS — L989 Disorder of the skin and subcutaneous tissue, unspecified: Secondary | ICD-10-CM

## 2015-09-01 DIAGNOSIS — IMO0001 Reserved for inherently not codable concepts without codable children: Secondary | ICD-10-CM

## 2015-09-01 DIAGNOSIS — M609 Myositis, unspecified: Secondary | ICD-10-CM

## 2015-09-01 DIAGNOSIS — IMO0002 Reserved for concepts with insufficient information to code with codable children: Secondary | ICD-10-CM

## 2015-09-01 DIAGNOSIS — E1165 Type 2 diabetes mellitus with hyperglycemia: Secondary | ICD-10-CM

## 2015-09-01 LAB — POCT CBC
Granulocyte percent: 54.7 %G (ref 37–80)
HCT, POC: 43.6 % (ref 43.5–53.7)
Hemoglobin: 14.8 g/dL (ref 14.1–18.1)
Lymph, poc: 3.2 (ref 0.6–3.4)
MCH, POC: 28.6 pg (ref 27–31.2)
MCHC: 33.9 g/dL (ref 31.8–35.4)
MCV: 84.3 fL (ref 80–97)
MID (cbc): 0.7 (ref 0–0.9)
MPV: 6.8 fL (ref 0–99.8)
POC Granulocyte: 4.8 (ref 2–6.9)
POC LYMPH PERCENT: 36.9 %L (ref 10–50)
POC MID %: 8.4 %M (ref 0–12)
Platelet Count, POC: 229 10*3/uL (ref 142–424)
RBC: 5.17 M/uL (ref 4.69–6.13)
RDW, POC: 13.6 %
WBC: 8.8 10*3/uL (ref 4.6–10.2)

## 2015-09-01 LAB — COMPLETE METABOLIC PANEL WITH GFR
ALT: 28 U/L (ref 9–46)
AST: 19 U/L (ref 10–35)
Albumin: 4 g/dL (ref 3.6–5.1)
Alkaline Phosphatase: 85 U/L (ref 40–115)
BUN: 15 mg/dL (ref 7–25)
CO2: 29 mmol/L (ref 20–31)
Calcium: 9.5 mg/dL (ref 8.6–10.3)
Chloride: 98 mmol/L (ref 98–110)
Creat: 0.62 mg/dL — ABNORMAL LOW (ref 0.70–1.33)
GFR, Est African American: >89 mL/min (ref >=60)
GFR, Est Non African American: >89 mL/min (ref >=60)
Glucose, Bld: 199 mg/dL — ABNORMAL HIGH (ref 65–99)
Potassium: 4.5 mmol/L (ref 3.5–5.3)
Sodium: 133 mmol/L — ABNORMAL LOW (ref 135–146)
Total Bilirubin: 0.6 mg/dL (ref 0.2–1.2)
Total Protein: 6.5 g/dL (ref 6.1–8.1)

## 2015-09-01 LAB — CK: Total CK: 77 U/L (ref 7–232)

## 2015-09-01 LAB — POCT SEDIMENTATION RATE: POCT SED RATE: 16 mm/hr (ref 0–22)

## 2015-09-01 NOTE — Patient Instructions (Signed)
Stop the fenofibrate and simvastatin
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
## 2015-09-15 ENCOUNTER — Ambulatory Visit (INDEPENDENT_AMBULATORY_CARE_PROVIDER_SITE_OTHER): Payer: BC Managed Care – PPO | Admitting: Neurology

## 2015-09-15 ENCOUNTER — Encounter: Payer: Self-pay | Admitting: Neurology

## 2015-09-15 VITALS — BP 163/94 | HR 87 | Ht 75.0 in | Wt 394.0 lb

## 2015-09-15 DIAGNOSIS — R202 Paresthesia of skin: Secondary | ICD-10-CM | POA: Diagnosis not present

## 2015-09-15 DIAGNOSIS — R269 Unspecified abnormalities of gait and mobility: Secondary | ICD-10-CM | POA: Diagnosis not present
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
## 2015-09-26 ENCOUNTER — Ambulatory Visit
Admission: RE | Admit: 2015-09-26 | Discharge: 2015-09-26 | Disposition: A | Discharge status: Home / Self Care | Payer: BC Managed Care – PPO | Source: Ambulatory Visit | Attending: Neurology | Admitting: Neurology

## 2015-09-26 DIAGNOSIS — R202 Paresthesia of skin: Secondary | ICD-10-CM | POA: Diagnosis not present

## 2015-09-26 DIAGNOSIS — R269 Unspecified abnormalities of gait and mobility: Secondary | ICD-10-CM

## 2015-09-29 ENCOUNTER — Telehealth: Payer: Self-pay | Admitting: Neurology

## 2015-09-29 DIAGNOSIS — R269 Unspecified abnormalities of gait and mobility: Secondary | ICD-10-CM

## 2015-09-29 DIAGNOSIS — R200 Anesthesia of skin: Secondary | ICD-10-CM

## 2015-09-29 DIAGNOSIS — E119 Type 2 diabetes mellitus without complications: Secondary | ICD-10-CM

## 2015-09-29 DIAGNOSIS — M545 Low back pain: Secondary | ICD-10-CM

## 2015-09-29 NOTE — Telephone Encounter (Signed)
Patient aware of results - he is agreeable to have repeat NCV/EMG.

## 2015-09-29 NOTE — Telephone Encounter (Signed)
Please call patient MRI of the lumbar showed multilevel degenerative disc disease, moderate to severe canal stenosis at L2-3, L3-4,     spine shows severe degenerative changes as detailed above. Most significant findings are: 1. At L1-L2 there is mild spinal stenosis but there does not appear to be any nerve root compression. 2. At L2-L3, there is severe spinal stenosis and moderately severe lateral recess stenosis bilaterally that could lead to bilateral L3 nerve root compression. 3. At L3-L4, there is moderate spinal stenosis but there does not appear to be any nerve root depression 4. At L4-L5, there is a central disc herniation contributing to severe spinal stenosis and possible compression of either L5 nerve root 5. At L5-S1, there is a left paramedian disc herniation contributing to mild spinal stenosis, probable left S1 nerve root compression, probable right L5 nerve root compression and possible left L5 nerve root compression  I have ordered  repeat EMG/NCS.

## 2015-10-09 ENCOUNTER — Ambulatory Visit (INDEPENDENT_AMBULATORY_CARE_PROVIDER_SITE_OTHER): Payer: BC Managed Care – PPO | Admitting: Family Medicine

## 2015-10-09 VITALS — BP 150/80 | HR 104 | Temp 98.3°F | Resp 20 | Ht 75.0 in | Wt 396.8 lb

## 2015-10-09 DIAGNOSIS — L609 Nail disorder, unspecified: Secondary | ICD-10-CM

## 2015-10-09 DIAGNOSIS — L03031 Cellulitis of right toe: Secondary | ICD-10-CM | POA: Diagnosis not present

## 2015-10-09 DIAGNOSIS — B351 Tinea unguium: Secondary | ICD-10-CM | POA: Diagnosis not present

## 2015-10-09 MED ORDER — DOXYCYCLINE HYCLATE 100 MG PO CAPS: 100.0000 mg | ORAL_CAPSULE | Freq: Two times a day (BID) | ORAL | Status: DC

## 2015-10-09 NOTE — Progress Notes (Signed)
Risk and benefits discussed and verbal consent obtained.  Patient anesthetized with 1:1 mix of 2% Lidocaine without epi and Marcaine.  Toenail lifted with nail lifter and curved hemostats used to remove nail.  Nail removed and intact.  Xeroform placed.  Patient advised to follow up as needed.  Deliah Boston, MS, PA-C 10:02 AM, 10/09/2015

## 2015-10-09 NOTE — Progress Notes (Signed)
   Subjective:    Patient ID: Johnny Andrews, male    DOB: 1961-01-27, 55 y.o.   MRN: [id number]  HPI Patient reports today with thickened right great toe and bleeding under the nail. Has long standing issues with fungal infection of great toe and was given a prescription for a topical medication that was too expensive. Last night he noticed blood under his right toe. No known trauma, no pain. Wears steel toe shoes for work. He has some neuropathy and is scheduled for nerve conduction studies.   Has diabetes and sees Dr. Talmage Nap. Last HgbA1C 9s. Medication has recently been changed with improvement of home testing. Has appointment next week for follow up.   Past Medical History  Diagnosis Date  . HYPOTHYROIDISM 09/19/2007  . DIABETES MELLITUS, TYPE II 09/19/2007  . HYPERLIPIDEMIA 09/19/2007  . GOUT 04/17/2010  . ANXIETY 09/19/2007  . ALCOHOL USE 10/24/2008  . SMOKER 04/17/2010  . HYPERTENSION 09/19/2007  . External thrombosed hemorrhoids 08/23/2008  . OTHER DISEASES OF LUNG NOT ELSEWHERE CLASSIFIED 07/20/2010  . SCROTAL ABSCESS 05/27/2010  . Sebaceous cyst 08/01/2008  . OSTEOARTHRITIS 09/19/2007  . SLEEP APNEA 08/22/2009  . Rash and other nonspecific skin eruption 09/19/2007  . Cough 05/22/2008  . Restrictive lung disease   . Leg pain    Past Surgical History  Procedure Laterality Date  . Nasal septum surgery    . Colonoscopy     Family History  Problem Relation Age of Onset  . Breast cancer Mother     Breast Cancer  . Heart disease Father   . Lung disease Neg Hx   . Autoimmune disease Neg Hx    Social History  Substance Use Topics  . Smoking status: Former Smoker -- 0.50 packs/day for 30 years    Quit date: 11/05/2012  . Smokeless tobacco: Never Used     Comment: 30 years at most off and on smoking 03/14/15  . Alcohol Use: 0.0 oz/week    0 Standard drinks or equivalent per week     Comment: 2 drinks per day    Review of Systems Per HPI    Objective:   Physical Exam Obese male in  NAD Right great toe with thickened nail, loose nail. Dried blood under nail beds cleaned with soap and water. Some pustular drainage with manipulation of toenail. Culture obtained. Pedal pulses palpable. Skin dry with some cracking. No edema or erythema.    BP 150/80 mmHg  Pulse 104  Temp(Src) 98.3 F (36.8 C) (Oral)  Resp 20  Ht  (1.905 m)  Wt 396 lb 12.8 oz (179.987 kg)  BMI 49.60 kg/m2  SpO2 94% Wt Readings from Last 3 Encounters:  10/09/15 396 lb 12.8 oz (179.987 kg)  09/15/15 394 lb (178.717 kg)  09/01/15 388 lb (175.996 kg)       Assessment & Plan:  1. Onychomycosis of right great toe - Should be curative with removal of toe - provided information regarding preventing recurrence  2. Loose toenail - removed, post procedure instructions provided  3. Infection of nailbed of toe of right foot - Wound culture - doxycycline (VIBRAMYCIN) 100 MG capsule; Take 1 capsule (100 mg total) by mouth 2 (two) times daily.  Dispense: 20 capsule; Refill: 0 - RTC precautions reviewed  Olean Ree, FNP-BC  [facility], [facility]  10/09/2015 10:04 AM

## 2015-10-09 NOTE — Patient Instructions (Addendum)
Leave yellow dressing on until it falls off, keep dressing on it for comfort, protection. Can leave it off when no chance of injury.  Start antibiotic today Preventing Toenail Fungus from Recurring   Sanitize your shoes with Mycomist spray or a similar shoe sanitizer spray.  Follow the instructions on the bottle and dry them outside in the sun or with a hairdryer.  We also recommend repeating the sanitization once weekly in shoes you wear most often.   Throw away any shoes you have worn a significant amount without socks-fungus thrives in a warm moist environment and you want to avoid re-infection after your laser procedure   Bleach your socks with regular or color safe bleach   Change your socks regularly to keep your feet clean and dry (especially if you have sweaty feet)-if sweaty feet are a problem, let your doctor know-there is a great lotion that helps with this problem.   Clean your toenail clippers with alcohol before you use them if you do your own toenails and make sure to replace Emory boards and orange sticks regularly   If you get regular pedicures, bring your own instruments or go to a spa that sterilizes their instruments in an autoclave.

## 2015-10-11 ENCOUNTER — Other Ambulatory Visit: Payer: Self-pay | Admitting: Family Medicine

## 2015-10-11 LAB — WOUND CULTURE
GRAM STAIN: NONE SEEN
GRAM STAIN: NONE SEEN

## 2015-10-11 MED ORDER — AMOXICILLIN 875 MG PO TABS: 875.0000 mg | ORAL_TABLET | Freq: Two times a day (BID) | ORAL | Status: DC

## 2015-10-20 ENCOUNTER — Ambulatory Visit: Payer: BC Managed Care – PPO | Admitting: Neurology

## 2015-10-22 ENCOUNTER — Other Ambulatory Visit: Payer: Self-pay | Admitting: Endocrinology

## 2015-11-03 ENCOUNTER — Ambulatory Visit (INDEPENDENT_AMBULATORY_CARE_PROVIDER_SITE_OTHER): Payer: BC Managed Care – PPO | Admitting: Neurology

## 2015-11-03 DIAGNOSIS — E119 Type 2 diabetes mellitus without complications: Secondary | ICD-10-CM

## 2015-11-03 DIAGNOSIS — R200 Anesthesia of skin: Secondary | ICD-10-CM

## 2015-11-03 DIAGNOSIS — M4806 Spinal stenosis, lumbar region: Secondary | ICD-10-CM

## 2015-11-03 DIAGNOSIS — M48061 Spinal stenosis, lumbar region without neurogenic claudication: Secondary | ICD-10-CM

## 2015-11-03 DIAGNOSIS — M545 Low back pain: Secondary | ICD-10-CM | POA: Diagnosis not present

## 2015-11-03 DIAGNOSIS — R202 Paresthesia of skin: Secondary | ICD-10-CM

## 2015-11-03 DIAGNOSIS — R269 Unspecified abnormalities of gait and mobility: Secondary | ICD-10-CM | POA: Diagnosis not present
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
## 2015-11-03 NOTE — Procedures (Signed)
   NCS (NERVE CONDUCTION STUDY) WITH EMG (ELECTROMYOGRAPHY) REPORT   STUDY DATE: November 03 2015 PATIENT NAME: Johnny LongestMichael S Ybarbo DOB: [birth year] MRN: [id number]    TECHNOLOGIST: Gearldine ShownLorraine Jones ELECTROMYOGRAPHER: Levert FeinsteinYan, Nakeem Murnane M.D.  CLINICAL INFORMATION:  55 years old male, with history of chronic low back pain, diabetes poorly controlled, A1c up to 11, presenting with bilateral feet numbness, gait difficulty,  FINDINGS: NERVE CONDUCTION STUDY: Bilateral peroneal, sural sensory responses were absent. Bilateral peroneal to EDB, tibial motor responses were absent. Bilateral tibial H reflexes were absent.  Left ulnar sensory and motor responses were normal. Left median sensory response showed mildly prolonged peak latency, with normal snap amplitude. Left median motor responses showed mildly prolonged distal latency, F wave latency, with mildly decreased C map amplitude, normal conduction velocity.  NEEDLE ELECTROMYOGRAPHY: Selected needle examinations were performed at bilateral lower extremity muscles, bilateral lumbar sacral paraspinal muscles, right upper extremity muscles, right thoracic paraspinal muscles.  Bilateral tibialis anterior, tibialis posterior,medial gastrocnemius, right peroneal longus: Increased insertional activity, 1 plus spontaneous activity, enlarged complex motor unit potential, with decreased recruitment patterns.  Bilateral vastus lateralis: Increased insertional activity, no spontaneous activity, enlarged complex motor unit potential, with decreased recruitment patterns.  Bilateral biceps femoris long head: Increased insertional activity, no spontaneous activity, mildly enlarged complex motor unit potential, with mildly decreased recruitment patterns.  Bilateral gluteus medius: Increased insertional activity, 2 plus spontaneous activity, enlarged complex motor unit potential was decreased recruitment patterns.  He has increased insertional activity, 2 plus spontaneous  activity at bilateral upper lumbar sacral paraspinal muscles, bilateral L3, 4; mildly increased at bilateral L5-S1, there was no spontaneous activity.   Selected needle examination of right first dorsal interossei, biceps, triceps, pronator teres was normal.  There was no spontaneous activity at right thoracic paraspinal muscles, right T9-T10.  IMPRESSION:   This is an marked abnormal study, there is electrodiagnostic evidence of severe length dependent axonal peripheral neuropathy, consistent with his poorly controlled diabetes. In addition, there is evidence of active bilateral lumbosacral radiculopathies, mainly involving bilateral L4-L5, S1 myotomes.   INTERPRETING PHYSICIAN:   Levert FeinsteinYan, Bena Kobel M.D. Ph.D. [facility] [address], [address] [place], [place] [address] [phone]

## 2015-11-03 NOTE — Patient Instructions (Signed)
[facility] [address] [place], [place] [address] Phone: [phone]  Phone: [phone]\\

## 2015-11-04 ENCOUNTER — Other Ambulatory Visit (INDEPENDENT_AMBULATORY_CARE_PROVIDER_SITE_OTHER): Payer: Self-pay

## 2015-11-04 DIAGNOSIS — Z0289 Encounter for other administrative examinations: Secondary | ICD-10-CM

## 2015-11-06 LAB — VITAMIN B1: Thiamine: 138.5 nmol/L (ref 66.5–200.0)

## 2015-11-06 LAB — ANA W/REFLEX IF POSITIVE: Anti Nuclear Antibody(ANA): NEGATIVE

## 2015-11-06 LAB — CK: CK TOTAL: 148 U/L (ref 24–204)

## 2015-11-06 LAB — C-REACTIVE PROTEIN: CRP: 3.2 mg/L (ref 0.0–4.9)

## 2015-11-06 LAB — FOLATE: Folate: 13.8 ng/mL (ref >3.0)

## 2015-11-06 LAB — RPR: RPR Ser Ql: NONREACTIVE

## 2015-11-06 LAB — VITAMIN B12: VITAMIN B 12: 369 pg/mL (ref 211–946)

## 2015-11-11 ENCOUNTER — Ambulatory Visit: Payer: BC Managed Care – PPO | Admitting: Neurology

## 2015-11-20 ENCOUNTER — Telehealth: Payer: Self-pay | Admitting: Neurology

## 2015-11-20 NOTE — Telephone Encounter (Signed)
[facility], referred to their clinic was decline, please try to [facility],

## 2015-11-25 NOTE — Telephone Encounter (Signed)
[facility] [phone] fax [phone]. Doctor's have to review first and they will call patient to schedule. Patient [facility] as well.

## 2015-12-10 ENCOUNTER — Telehealth: Payer: Self-pay | Admitting: Neurology

## 2015-12-10 NOTE — Telephone Encounter (Deleted)
Patient is calling about a referral that was sent to [facility]. Please call to discuss.

## 2015-12-15 ENCOUNTER — Telehealth: Payer: Self-pay | Admitting: *Deleted

## 2015-12-15 NOTE — Telephone Encounter (Signed)
Patient called asking questions about his referral  [facility] at [facility] telephone [phone] fax [phone] referral has been resent patient [facility] his mri disc, patient aware  of all details.

## 2016-01-13 ENCOUNTER — Telehealth: Payer: Self-pay | Admitting: Neurology

## 2016-01-13 NOTE — Telephone Encounter (Signed)
Message For: OFFICE               Taken 30-MAY-17 at 11:08AM by [facility] ------------------------------------------------------------  Caller  Sharene ButtersMICHAEL Andrews             CID  [id number]   Patient  SAME                  Pt's Dr  Terrace ArabiaYAN           Area Code  336  Phone#  [phone] *  DOB  Nov 01, 2060      RE  [facility] APPT 6/1 @ 3:45,NOT NEEDING TO RESCHEDULE                                                           Disp:Y/N  N  If Y = C/B If No Response In 20minutes  ============================================================  I cancelled pt's appt.

## 2016-01-15 ENCOUNTER — Ambulatory Visit: Payer: BC Managed Care – PPO | Admitting: Neurology

## 2016-03-08 ENCOUNTER — Other Ambulatory Visit: Payer: Self-pay | Admitting: Endocrinology

## 2016-03-08 NOTE — Telephone Encounter (Signed)
Please refill x 2 months Ov is due 

## 2016-04-06 ENCOUNTER — Encounter (INDEPENDENT_AMBULATORY_CARE_PROVIDER_SITE_OTHER): Payer: BC Managed Care – PPO | Admitting: Ophthalmology

## 2016-04-28 ENCOUNTER — Encounter (INDEPENDENT_AMBULATORY_CARE_PROVIDER_SITE_OTHER): Payer: BC Managed Care – PPO | Admitting: Ophthalmology

## 2016-04-28 DIAGNOSIS — H35033 Hypertensive retinopathy, bilateral: Secondary | ICD-10-CM | POA: Diagnosis not present

## 2016-04-28 DIAGNOSIS — I10 Essential (primary) hypertension: Secondary | ICD-10-CM | POA: Diagnosis not present

## 2016-04-28 DIAGNOSIS — H5340 Unspecified visual field defects: Secondary | ICD-10-CM

## 2016-04-28 DIAGNOSIS — H43813 Vitreous degeneration, bilateral: Secondary | ICD-10-CM | POA: Diagnosis not present

## 2016-04-28 DIAGNOSIS — E113313 Type 2 diabetes mellitus with moderate nonproliferative diabetic retinopathy with macular edema, bilateral: Secondary | ICD-10-CM

## 2016-04-28 DIAGNOSIS — E11311 Type 2 diabetes mellitus with unspecified diabetic retinopathy with macular edema: Secondary | ICD-10-CM | POA: Diagnosis not present

## 2016-05-12 ENCOUNTER — Other Ambulatory Visit (INDEPENDENT_AMBULATORY_CARE_PROVIDER_SITE_OTHER): Payer: BC Managed Care – PPO | Admitting: Ophthalmology

## 2016-05-12 DIAGNOSIS — E11311 Type 2 diabetes mellitus with unspecified diabetic retinopathy with macular edema: Secondary | ICD-10-CM | POA: Diagnosis not present

## 2016-05-12 DIAGNOSIS — E113311 Type 2 diabetes mellitus with moderate nonproliferative diabetic retinopathy with macular edema, right eye: Secondary | ICD-10-CM

## 2016-05-25 ENCOUNTER — Other Ambulatory Visit: Payer: Self-pay | Admitting: Endocrinology

## 2016-07-22 DIAGNOSIS — M5136 Other intervertebral disc degeneration, lumbar region: Secondary | ICD-10-CM | POA: Insufficient documentation

## 2016-08-31 ENCOUNTER — Encounter: Payer: Self-pay | Admitting: Family Medicine

## 2016-08-31 ENCOUNTER — Ambulatory Visit (INDEPENDENT_AMBULATORY_CARE_PROVIDER_SITE_OTHER): Payer: BC Managed Care – PPO | Admitting: Family Medicine

## 2016-08-31 VITALS — BP 157/92 | HR 90 | Temp 98.6°F | Resp 16 | Ht 75.0 in | Wt >= 6400 oz

## 2016-08-31 DIAGNOSIS — E034 Atrophy of thyroid (acquired): Secondary | ICD-10-CM

## 2016-08-31 DIAGNOSIS — E7800 Pure hypercholesterolemia, unspecified: Secondary | ICD-10-CM

## 2016-08-31 DIAGNOSIS — I10 Essential (primary) hypertension: Secondary | ICD-10-CM | POA: Diagnosis not present

## 2016-08-31 DIAGNOSIS — K5901 Slow transit constipation: Secondary | ICD-10-CM

## 2016-08-31 DIAGNOSIS — G8929 Other chronic pain: Secondary | ICD-10-CM

## 2016-08-31 DIAGNOSIS — M545 Low back pain: Secondary | ICD-10-CM

## 2016-08-31 DIAGNOSIS — F1010 Alcohol abuse, uncomplicated: Secondary | ICD-10-CM

## 2016-08-31 DIAGNOSIS — G4733 Obstructive sleep apnea (adult) (pediatric): Secondary | ICD-10-CM | POA: Diagnosis not present

## 2016-08-31 DIAGNOSIS — M48062 Spinal stenosis, lumbar region with neurogenic claudication: Secondary | ICD-10-CM

## 2016-08-31 DIAGNOSIS — N529 Male erectile dysfunction, unspecified: Secondary | ICD-10-CM

## 2016-08-31 DIAGNOSIS — Q828 Other specified congenital malformations of skin: Secondary | ICD-10-CM

## 2016-08-31 DIAGNOSIS — I739 Peripheral vascular disease, unspecified: Secondary | ICD-10-CM | POA: Diagnosis not present

## 2016-08-31 DIAGNOSIS — Z1211 Encounter for screening for malignant neoplasm of colon: Secondary | ICD-10-CM | POA: Diagnosis not present

## 2016-08-31 DIAGNOSIS — E1142 Type 2 diabetes mellitus with diabetic polyneuropathy: Secondary | ICD-10-CM

## 2016-08-31 DIAGNOSIS — Z794 Long term (current) use of insulin: Secondary | ICD-10-CM

## 2016-08-31 NOTE — Progress Notes (Signed)
Subjective:    Patient ID: Johnny Andrews, male    DOB: 08/06/1961, 56 y.o.   MRN: [id number]  08/31/2016  Establish Care ( remove skin tag under both arms )   HPI This 56 y.o. male presents to establish care.    Diagnosed with DMII thirty years ago.  Has been drinking beer for years. Gaining five pounds every year.  Has underactive thyroid in age 51s.  Diagnosed self with diabetes while watching mash episode in 1993.  Started taking medications in 1993.  Medications have always taken care of sugars; HgbA1c has always been averaging 10-11.  Then would range 8-10.  Has been followed by Romero Belling for 10-15 years; came highly recommended; was a 3 minute provider.Everardo All is PCP.  Then evaluated by B[place] at [facility]; she is wonderful; loves women providers.  Now HgbA1c down to 8.0s.  Slowly improving.  Becoming harder, because neuropathy in the past three years of feet mostly.  Can feel touches to feet.  Delayed signal.  If steps on rock in parking lot; twisting ankle; worried about falling.  Tries to avoid uneven surfaces.  Walking is an issue for past three years especially for last year.  Thought neuropathy was getting worse.  Spine provider thinks that sedentary lifestyle. Did go to a dietician/nutritionist which was helpful.   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 Having leg pains in proximal thighs.  Thinks really having muscle pains from lack of exercise.  Can walk 100 yards.  No ABIs performed.  Only had neuropathy testing.    Would like an EKG.  Needs colonoscopy.  Brother just had polyp removed.    Chronic back issues; L3-4-5 with disc issues. Epidural injections recurrent. S/p several MRI injections.  [facility]  Recommended weight loss of 150#.   Talking with father who is 6 years old.  Has dicussed care with his providers.  Has neurologist who manages neuropathy.    Has stationary bike and try to work up; has a pool at  work at [employer].  Recovers quickly after exercise.   Pain in legs recovers quickly.    Drinks every day.  Can still eat burger but skips fries; no longer drinks beer.  Drinks liquor diet coke whiskey for one hour per night; has two drinks per night.  Stadium cup.  Ice included.  Then eats dinner after drinks; portion is not big but will not eat lean cuisine.  When sugar reaches 80, gets shaky.  After meal, gets to 200 and stays there.    Getting really constipated.   Now struggling.  Just dieting.  Also has a friend who has discussed bariatric surgery.  Has been looking into bariatric surgery for the past ten years.  Ten years ago, very expensive.  Three years ago, not comfortable with .  The hit a wall. Works for the state.  Eats in middle of night.  Does not sleep well.  Gets really hot one side.  Drinks a lot of water; eats at night due to rumbling stomach and not due to hunger. No other times of reflux.  Has been to a free seminar.    Really scheduled appointment to have skin tags removed.   Immunization History  Administered Date(s) Administered  . Influenza Split 06/23/2012  . Influenza,inj,Quad PF,36+ Mos 05/31/2016  . Pneumococcal Polysaccharide-23 12/13/2011  . Td 10/24/2008   BP Readings from Last 3 Encounters:  08/31/16 (!) 157/92  10/09/15 (!) 150/80  09/15/15 (!) 163/94   Wt Readings from Last 3 Encounters:  08/31/16 (!) 403 lb (182.8 kg)  10/09/15 (!) 396 lb 12.8 oz (180 kg)  09/15/15 (!) 394 lb (178.7 kg)    Review of Systems  Constitutional: Negative for activity change, appetite change, chills, diaphoresis, fatigue and fever.  Eyes: Negative for visual disturbance.  Respiratory: Negative for cough and shortness of breath.   Cardiovascular: Negative for chest pain, palpitations and leg swelling.  Gastrointestinal: Positive for constipation. Negative for abdominal distention, abdominal pain, anal bleeding, blood in stool, diarrhea, nausea and rectal pain.    Endocrine: Negative for cold intolerance, heat intolerance, polydipsia, polyphagia and polyuria.  Musculoskeletal: Positive for back pain and myalgias.  Neurological: Positive for numbness. Negative for dizziness, tremors, seizures, syncope, facial asymmetry, speech difficulty, weakness, light-headedness and headaches.    Past Medical History:  Diagnosis Date  . ALCOHOL USE 10/24/2008  . ANXIETY 09/19/2007  . Cough 05/22/2008  . DIABETES MELLITUS, TYPE II 09/19/2007  . External thrombosed hemorrhoids 08/23/2008  . GOUT 04/17/2010  . HYPERLIPIDEMIA 09/19/2007  . HYPERTENSION 09/19/2007  . HYPOTHYROIDISM 09/19/2007  . Leg pain   . OSTEOARTHRITIS 09/19/2007  . OTHER DISEASES OF LUNG NOT ELSEWHERE CLASSIFIED 07/20/2010  . Rash and other nonspecific skin eruption 09/19/2007  . Restrictive lung disease   . SCROTAL ABSCESS 05/27/2010  . Sebaceous cyst 08/01/2008  . SLEEP APNEA 08/22/2009  . SMOKER 04/17/2010   Past Surgical History:  Procedure Laterality Date  . COLONOSCOPY    . NASAL SEPTUM SURGERY     No Known Allergies  Social History   Social History  . Marital status: Married    Spouse name: N/A  . Number of children: 2  . Years of education: BS   Occupational History  . [employer]   Social History Main Topics  . Smoking status: Former Smoker    Packs/day: 0.50    Years: 30.00    Quit date: 11/05/2012  . Smokeless tobacco: Never Used     Comment: 30 years at most off and on smoking 03/14/15  . Alcohol use 0.0 oz/week     Comment: 2 drinks per day  . Drug use: Yes     Comment: Occasional Marijuana use  . Sexual activity: Not on file   Other Topics Concern  . Not on file   Social History Narrative   Originally from [place]. Has lived in [place], [place], [place], & [place] since 1993. 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 Currently has a dog & cats. Previously had a parrot (conure) 12 years ago for 1 year but in same house.  Has also had  excessive exposure to pigeon feces. He has had very brief exposure to asbestos around 2000. No hot tub exposure. He has inhaled chemical fumes/gases/refridgerants through his work.      Lives at home with his wife   Right-handed.   1 cup caffeine daily.      Family History  Problem Relation Age of Onset  . Breast cancer Mother     Breast Cancer  . Heart disease Father   . Lung disease Neg Hx   . Autoimmune disease Neg Hx        Objective:    BP (!) 157/92 (BP Location: Right Arm, Patient Position: Sitting, Cuff Size: Large)   Pulse 90   Temp 98.6 F (37 C) (Oral)   Resp 16  Ht 6\' 3"  (1.905 m)   Wt (!) 403 lb (182.8 kg)   SpO2 94%   BMI 50.37 kg/m  Physical Exam  Constitutional: He is oriented to person, place, and time. He appears well-developed and well-nourished. No distress.  HENT:  Head: Normocephalic and atraumatic.  Right Ear: External ear normal.  Left Ear: External ear normal.  Nose: Nose normal.  Mouth/Throat: Oropharynx is clear and moist.  Eyes: Conjunctivae and EOM are normal. Pupils are equal, round, and reactive to light.  Neck: Normal range of motion. Neck supple. Carotid bruit is not present. No thyromegaly present.  Cardiovascular: Normal rate, regular rhythm, normal heart sounds and intact distal pulses.  Exam reveals no gallop and no friction rub.   No murmur heard. Pulmonary/Chest: Effort normal and breath sounds normal. He has no wheezes. He has no rales.  Abdominal: Soft. Bowel sounds are normal. He exhibits no distension and no mass. There is no tenderness. There is no rebound and no guarding.  Lymphadenopathy:    He has no cervical adenopathy.  Neurological: He is alert and oriented to person, place, and time. No cranial nerve deficit.  Skin: Skin is warm and dry. No rash noted. He is not diaphoretic.  Multiple skin tags present in axilla B.  Psychiatric: He has a normal mood and affect. His behavior is normal.  Nursing note and vitals  reviewed.  Results for orders placed or performed in visit on 11/03/15  Vitamin B12  Result Value Ref Range   Vitamin B-12 369 211 - 946 pg/mL  RPR  Result Value Ref Range   RPR Ser Ql Non Reactive Non Reactive  Folate  Result Value Ref Range   Folate 13.8 >3.0 ng/mL  C-reactive protein  Result Value Ref Range   CRP 3.2 0.0 - 4.9 mg/L  CK  Result Value Ref Range   Total CK 148 24 - 204 U/L  ANA w/Reflex if Positive  Result Value Ref Range   Anit Nuclear Antibody(ANA) Negative Negative  Vitamin B1  Result Value Ref Range   Thiamine 138.5 66.5 - 200.0 nmol/L   Depression screen [facility] 2/9 08/31/2016 10/09/2015 09/01/2015 03/05/2015  Decreased Interest 0 0 0 0  Down, Depressed, Hopeless 0 0 0 0  PHQ - 2 Score 0 0 0 0   Fall Risk  08/31/2016  Falls in the past year? No       Assessment & Plan:   1. Slow transit constipation   2. Colon cancer screening   3. Claudication (HCC)   4. Morbid obesity (HCC)   5. Essential hypertension   6. Obstructive sleep apnea syndrome   7. Type 2 diabetes mellitus with diabetic polyneuropathy, with long-term current use of insulin (HCC)   8. Impotence of organic origin   9. Alcohol abuse   10. Chronic bilateral low back pain without sciatica   11. Spinal stenosis of lumbar region with neurogenic claudication   12. Accessory skin tags   13. Hypothyroidism due to acquired atrophy of thyroid   14. Pure hypercholesterolemia    -new onset constipation; refer for colonoscopy; also recommend increasing fiber intake and water intake; also recommend starting Colace or Miralax daily. -concern with claudication symptoms in B thighs; refer to cardiology for ABIs and arterial duplex studies before encouraging exercise for weight loss; also suffers with spinal stenosis and chronic lower back pain. -has chronically struggled with morbid obesity; highly recommend pt attend a free bariatric surgery seminar in the upcoming 1-2 months.  I  also recommend keeping a  food diary with [email or web address] -s/p removal of skin tags by PA Weber during visit. -prolonged face to face of 45 minutes with 50% of time dedicated to counseling and coordination of care and review of complex medical history.   Orders Placed This Encounter  Procedures  . Ambulatory referral to Gastroenterology    Referral Priority:   Routine    Referral Type:   Consultation    Referral Reason:   Specialty Services Required    Number of Visits Requested:   1  . Ambulatory referral to Cardiology    Referral Priority:   Routine    Referral Type:   Consultation    Referral Reason:   Specialty Services Required    Referred to Provider:   Runell Gess, MD    Requested Specialty:   Cardiology    Number of Visits Requested:   1   Meds ordered this encounter  Medications  . Dapagliflozin Propanediol (FARXIGA PO)    Sig: Take 50 mg by mouth.  . topiramate (TOPAMAX) 50 MG tablet    Sig: Take 50 mg by mouth 2 (two) times daily.    Return in about 2 months (around 10/29/2016) for complete physical examiniation.   Loryn Haacke Paulita Fujita, M.D. [facility] [address] [place], [place]  [address] [phone] phone [phone] fax

## 2016-08-31 NOTE — Patient Instructions (Addendum)
  1.  Recommend starting Benefiber and eating apples and increasing water intake.   2.  You can start stool softener (Colace or Miralax). 3.  Contact [facility] regarding bariatric seminar. 4.  Limit alcohol to weekends only.  5.  [email or web address] for food diary   IF you received an x-ray today, you will receive an invoice from [facility]. Please contact [facility] at [phone] with questions or concerns regarding your invoice.   IF you received labwork today, you will receive an invoice from [facility]. Please contact [facility] at [phone] with questions or concerns regarding your invoice.   Our billing staff will not be able to assist you with questions regarding bills from these companies.  You will be contacted with the lab results as soon as they are available. The fastest way to get your results is to activate your My Chart account. Instructions are located on the last page of this paperwork. If you have not heard from us regarding the results in 2 weeks, please contact this office.

## 2016-09-13 ENCOUNTER — Ambulatory Visit (INDEPENDENT_AMBULATORY_CARE_PROVIDER_SITE_OTHER): Payer: BC Managed Care – PPO | Admitting: Ophthalmology

## 2016-09-13 DIAGNOSIS — H35033 Hypertensive retinopathy, bilateral: Secondary | ICD-10-CM

## 2016-09-13 DIAGNOSIS — E103393 Type 1 diabetes mellitus with moderate nonproliferative diabetic retinopathy without macular edema, bilateral: Secondary | ICD-10-CM

## 2016-09-13 DIAGNOSIS — H43813 Vitreous degeneration, bilateral: Secondary | ICD-10-CM | POA: Diagnosis not present

## 2016-09-13 DIAGNOSIS — E10319 Type 1 diabetes mellitus with unspecified diabetic retinopathy without macular edema: Secondary | ICD-10-CM

## 2016-09-13 DIAGNOSIS — I10 Essential (primary) hypertension: Secondary | ICD-10-CM | POA: Diagnosis not present

## 2016-09-13 DIAGNOSIS — E7800 Pure hypercholesterolemia, unspecified: Secondary | ICD-10-CM | POA: Insufficient documentation

## 2016-09-13 DIAGNOSIS — H2513 Age-related nuclear cataract, bilateral: Secondary | ICD-10-CM

## 2016-09-13 LAB — HM DIABETES EYE EXAM

## 2016-10-05 ENCOUNTER — Encounter: Payer: Self-pay | Admitting: Cardiovascular Disease

## 2016-10-05 ENCOUNTER — Ambulatory Visit (INDEPENDENT_AMBULATORY_CARE_PROVIDER_SITE_OTHER): Payer: BC Managed Care – PPO | Admitting: Cardiovascular Disease

## 2016-10-05 VITALS — BP 158/94 | HR 78 | Ht 75.0 in | Wt >= 6400 oz

## 2016-10-05 DIAGNOSIS — I10 Essential (primary) hypertension: Secondary | ICD-10-CM

## 2016-10-05 DIAGNOSIS — I739 Peripheral vascular disease, unspecified: Secondary | ICD-10-CM | POA: Diagnosis not present

## 2016-10-05 DIAGNOSIS — E7800 Pure hypercholesterolemia, unspecified: Secondary | ICD-10-CM

## 2016-10-05 MED ORDER — LISINOPRIL 5 MG PO TABS: 5.0000 mg | ORAL_TABLET | Freq: Every day | ORAL | 1 refills | Status: DC

## 2016-10-05 NOTE — Progress Notes (Signed)
10/05/2016 Johnny Andrews   1960-08-25  [id number]  Primary Physician [name],[name], MD Primary Cardiologist: [name] Roseanne RenoFACP, FACC, FAHA, FSCAI  HPI:  Casimiro NeedleMichael is a very pleasant severely overweight married Caucasian male father of 2 children who works doing Hospital doctorHVAC. He was referred by [name] her cardiovascular evaluation because of risk factors. He quit smoking 10 years ago and smoked 10 pack years. He drinks alcohol (3 shots of whiskey) or daily basis. He has treated diabetes, hyperlipidemia and untreated retention. He was never had a heart attack or stroke. He says his father is in his 5180s but his had heart attacks in the past. He has back issues as well as diabetic peripheral neuropathy but denies claudication.   Current Outpatient Prescriptions  Medication Sig Dispense Refill  . BD ULTRA-FINE PEN NEEDLES 29G X 12.7MM MISC USE TWO CHECK BLOOD SUGAR TWICE A DAY. APPOINTMENT NEEDED FOR FURTHER REFILLS 100 each 0  . glucose blood (ONE TOUCH ULTRA TEST) test strip Two times a day dx 250.01     . insulin aspart (NOVOLOG FLEXPEN) 100 UNIT/ML FlexPen Inject 75 Units into the skin 3 (three) times daily with meals.     . Insulin Degludec (TRESIBA FLEXTOUCH) 100 UNIT/ML SOPN Inject 100 Units into the skin 2 (two) times daily.    Marland Kitchen. levothyroxine (SYNTHROID, LEVOTHROID) 125 MCG tablet 2 tabs daily 60 tablet 11  . metFORMIN (GLUCOPHAGE-XR) 500 MG 24 hr tablet Take 1,000 mg by mouth 2 (two) times daily.     Marland Kitchen. Respiratory Therapy Supplies (ADULT MASK) MISC 2 Devices by Does not apply route once. 2 each 0  . topiramate (TOPAMAX) 50 MG tablet Take 100 mg by mouth at bedtime.     Marland Kitchen. lisinopril (PRINIVIL,ZESTRIL) 5 MG tablet Take 1 tablet (5 mg total) by mouth daily. 90 tablet 1   No current facility-administered medications for this visit.     No Known Allergies  Social History   Social History  . Marital status: Married    Spouse name: N/A  . Number of children: 2  . Years of  education: BS   Occupational History  . [employer]   Social History Main Topics  . Smoking status: Former Smoker    Packs/day: 0.50    Years: 30.00    Quit date: 11/05/2012  . Smokeless tobacco: Never Used     Comment: 30 years at most off and on smoking 03/14/15  . Alcohol use 0.0 oz/week     Comment: 2 drinks per day  . Drug use: Yes     Comment: Occasional Marijuana use  . Sexual activity: Not on file   Other Topics Concern  . Not on file   Social History Narrative   Originally from [place]. Has lived in [place], [place], & [place] since 1993. 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 Currently has a dog & cats. Previously had a parrot (conure) 12 years ago for 1 year but in same house.  Has also had excessive exposure to pigeon feces. He has had very brief exposure to asbestos around 2000. No hot tub exposure. He has inhaled chemical fumes/gases/refridgerants through his work.      Lives at home with his wife   Right-handed.   1 cup caffeine daily.        Review of Systems: General: negative for chills, fever, night sweats or weight changes.  Cardiovascular:  negative for chest pain, dyspnea on exertion, edema, orthopnea, palpitations, paroxysmal nocturnal dyspnea or shortness of breath Dermatological: negative for rash Respiratory: negative for cough or wheezing Urologic: negative for hematuria Abdominal: negative for nausea, vomiting, diarrhea, bright red blood per rectum, melena, or hematemesis Neurologic: negative for visual changes, syncope, or dizziness All other systems reviewed and are otherwise negative except as noted above.    Blood pressure (!) 158/94, pulse 78, height 6\' 3"  (1.905 m), weight (!) 405 lb (183.7 kg).  General appearance: alert and no distress Neck: no adenopathy, no carotid bruit, no JVD, supple, symmetrical, trachea midline and thyroid not enlarged, symmetric, no tenderness/mass/nodules Lungs: clear  to auscultation bilaterally Heart: regular rate and rhythm, S1, S2 normal, no murmur, click, rub or gallop Extremities: extremities normal, atraumatic, no cyanosis or edema and 2+ pedal pulses  EKG normal sinus rhythm 86 with septal Q waves. I personally reviewed his EKG.  ASSESSMENT AND PLAN:   Essential hypertension History of hypertension blood pressure measured today at 158/94. He is not on antihypertensive medications. His renal function has been normal in the past. I'm going to start him on a low-dose ACE inhibitor and we'll check a basic metabolic panel in 3 weeks. He will follow-up with Dr. Cleon Gustin soon after that for further evaluation.  Pure hypercholesterolemia History of hyperlipidemia on statin therapy followed by his PCP. Also has hypertriglyceridemia probably related to dietary indiscretion      Runell Gess MD [facility], [facility] 10/05/2016 2:44 PM

## 2016-10-05 NOTE — Assessment & Plan Note (Signed)
History of hypertension blood pressure measured today at 158/94. He is not on antihypertensive medications. His renal function has been normal in the past. I'm going to start him on a low-dose ACE inhibitor and we'll check a basic metabolic panel in 3 weeks. He will follow-up with Dr. Cleon GustinBallin soon after that for further evaluation.

## 2016-10-05 NOTE — Patient Instructions (Signed)
Medication Instructions: START Lisinopril 5 mg daily.   Labwork: Your physician recommends that you return for lab work in: 3 weeks--BMET   Follow-Up: Your physician recommends that you schedule a follow-up appointment as needed with Dr. Allyson SabalBerry.  If you need a refill on your cardiac medications before your next appointment, please call your pharmacy.

## 2016-10-05 NOTE — Assessment & Plan Note (Signed)
History of hyperlipidemia on statin therapy followed by his PCP. Also has hypertriglyceridemia probably related to dietary indiscretion

## 2016-10-07 ENCOUNTER — Other Ambulatory Visit: Payer: Self-pay | Admitting: Cardiovascular Disease

## 2016-10-07 DIAGNOSIS — I10 Essential (primary) hypertension: Secondary | ICD-10-CM

## 2016-10-07 NOTE — Addendum Note (Signed)
Addended by: Evans LanceSTOVER, Georgio Hattabaugh W on: 10/07/2016 09:22 AM   Modules accepted: Orders

## 2016-10-29 ENCOUNTER — Encounter: Payer: Self-pay | Admitting: Family Medicine

## 2016-11-08 NOTE — Telephone Encounter (Signed)
Error

## 2016-12-02 ENCOUNTER — Ambulatory Visit (INDEPENDENT_AMBULATORY_CARE_PROVIDER_SITE_OTHER): Payer: BC Managed Care – PPO | Admitting: Physician Assistant

## 2016-12-02 ENCOUNTER — Telehealth: Payer: Self-pay | Admitting: Emergency Medicine

## 2016-12-02 VITALS — BP 168/82 | HR 89 | Temp 98.3°F | Resp 18 | Ht 75.0 in | Wt >= 6400 oz

## 2016-12-02 DIAGNOSIS — Z13220 Encounter for screening for lipoid disorders: Secondary | ICD-10-CM | POA: Diagnosis not present

## 2016-12-02 DIAGNOSIS — R030 Elevated blood-pressure reading, without diagnosis of hypertension: Secondary | ICD-10-CM | POA: Diagnosis not present

## 2016-12-02 DIAGNOSIS — I10 Essential (primary) hypertension: Secondary | ICD-10-CM

## 2016-12-02 DIAGNOSIS — M25562 Pain in left knee: Secondary | ICD-10-CM

## 2016-12-02 DIAGNOSIS — Z79899 Other long term (current) drug therapy: Secondary | ICD-10-CM

## 2016-12-02 DIAGNOSIS — G6289 Other specified polyneuropathies: Secondary | ICD-10-CM

## 2016-12-02 DIAGNOSIS — Z130 Encounter for screening for diseases of the blood and blood-forming organs and certain disorders involving the immune mechanism: Secondary | ICD-10-CM

## 2016-12-02 DIAGNOSIS — M25561 Pain in right knee: Secondary | ICD-10-CM | POA: Diagnosis not present

## 2016-12-02 DIAGNOSIS — G8929 Other chronic pain: Secondary | ICD-10-CM

## 2016-12-02 DIAGNOSIS — Z1329 Encounter for screening for other suspected endocrine disorder: Secondary | ICD-10-CM

## 2016-12-02 DIAGNOSIS — Z131 Encounter for screening for diabetes mellitus: Secondary | ICD-10-CM | POA: Diagnosis not present

## 2016-12-02 MED ORDER — LISINOPRIL 10 MG PO TABS: 10.0000 mg | ORAL_TABLET | Freq: Every day | ORAL | 3 refills | Status: DC

## 2016-12-02 MED ORDER — TOPIRAMATE 50 MG PO TABS: 100.0000 mg | ORAL_TABLET | Freq: Every day | ORAL | 1 refills | Status: DC

## 2016-12-02 MED ORDER — DICLOFENAC SODIUM 75 MG PO TBEC: 75.0000 mg | DELAYED_RELEASE_TABLET | Freq: Two times a day (BID) | ORAL | 1 refills | Status: DC

## 2016-12-02 NOTE — Telephone Encounter (Signed)
Advised to call patient to ask when was the last time he took BP medication.  States, he takes medication in the evenings.  Advised to increase Lisinopril to 20 mg tab daily. States, pharmacy recommend he increase dose.  Advised to take 2 tablets and complete script and we will send new script to the pharmacy.   He will purchase blood pressure machine online.

## 2016-12-02 NOTE — Patient Instructions (Addendum)
Please schedule an annual exam with Dr. Tamala Julian soon.  Come in at your convenience for LAB ONLY visit before your annual exam.    Thank you for coming in today. I hope you feel we met your needs.  Feel free to call [facility] if you have any questions or further requests.  Please consider signing up for MyChart if you do not already have it, as this is a great way to communicate with me.  Best,  Whitney McVey, PA-C    DASH Eating Plan DASH stands for "Dietary Approaches to Stop Hypertension." The DASH eating plan is a healthy eating plan that has been shown to reduce high blood pressure (hypertension). It may also reduce your risk for type 2 diabetes, heart disease, and stroke. The DASH eating plan may also help with weight loss. What are tips for following this plan? General guidelines   Avoid eating more than 2,300 mg (milligrams) of salt (sodium) a day. If you have hypertension, you may need to reduce your sodium intake to 1,500 mg a day.  Limit alcohol intake to no more than 1 drink a day for nonpregnant women and 2 drinks a day for men. One drink equals 12 oz of beer, 5 oz of wine, or 1 oz of hard liquor.  Work with your health care provider to maintain a healthy body weight or to lose weight. Ask what an ideal weight is for you.  Get at least 30 minutes of exercise that causes your heart to beat faster (aerobic exercise) most days of the week. Activities may include walking, swimming, or biking.  Work with your health care provider or diet and nutrition specialist (dietitian) to adjust your eating plan to your individual calorie needs. Reading food labels   Check food labels for the amount of sodium per serving. Choose foods with less than 5 percent of the Daily Value of sodium. Generally, foods with less than 300 mg of sodium per serving fit into this eating plan.  To find whole grains, look for the word "whole" as the first word in the ingredient list. Shopping   Buy products  labeled as "low-sodium" or "no salt added."  Buy fresh foods. Avoid canned foods and premade or frozen meals. Cooking   Avoid adding salt when cooking. Use salt-free seasonings or herbs instead of table salt or sea salt. Check with your health care provider or pharmacist before using salt substitutes.  Do not fry foods. Cook foods using healthy methods such as baking, boiling, grilling, and broiling instead.  Cook with heart-healthy oils, such as olive, canola, soybean, or sunflower oil. Meal planning    Eat a balanced diet that includes:  5 or more servings of fruits and vegetables each day. At each meal, try to fill half of your plate with fruits and vegetables.  Up to 6-8 servings of whole grains each day.  Less than 6 oz of lean meat, poultry, or fish each day. A 3-oz serving of meat is about the same size as a deck of cards. One egg equals 1 oz.  2 servings of low-fat dairy each day.  A serving of nuts, seeds, or beans 5 times each week.  Heart-healthy fats. Healthy fats called Omega-3 fatty acids are found in foods such as flaxseeds and coldwater fish, like sardines, salmon, and mackerel.  Limit how much you eat of the following:  Canned or prepackaged foods.  Food that is high in trans fat, such as fried foods.  Food that is  high in saturated fat, such as fatty meat.  Sweets, desserts, sugary drinks, and other foods with added sugar.  Full-fat dairy products.  Do not salt foods before eating.  Try to eat at least 2 vegetarian meals each week.  Eat more home-cooked food and less restaurant, buffet, and fast food.  When eating at a restaurant, ask that your food be prepared with less salt or no salt, if possible. What foods are recommended? The items listed may not be a complete list. Talk with your dietitian about what dietary choices are best for you. Grains  Whole-grain or whole-wheat bread. Whole-grain or whole-wheat pasta. Brown rice. [place] Morrow.  Bulgur. Whole-grain and low-sodium cereals. Pita bread. Low-fat, low-sodium crackers. Whole-wheat flour tortillas. Vegetables  Fresh or frozen vegetables (raw, steamed, roasted, or grilled). Low-sodium or reduced-sodium tomato and vegetable juice. Low-sodium or reduced-sodium tomato sauce and tomato paste. Low-sodium or reduced-sodium canned vegetables. Fruits  All fresh, dried, or frozen fruit. Canned fruit in natural juice (without added sugar). Meat and other protein foods  Skinless chicken or [place]. Ground chicken or [place]. Pork with fat trimmed off. Fish and seafood. Egg whites. Dried beans, peas, or lentils. Unsalted nuts, nut butters, and seeds. Unsalted canned beans. Lean cuts of beef with fat trimmed off. Low-sodium, lean deli meat. Dairy  Low-fat (1%) or fat-free (skim) milk. Fat-free, low-fat, or reduced-fat cheeses. Nonfat, low-sodium ricotta or cottage cheese. Low-fat or nonfat yogurt. Low-fat, low-sodium cheese. Fats and oils  Soft margarine without trans fats. Vegetable oil. Low-fat, reduced-fat, or light mayonnaise and salad dressings (reduced-sodium). Canola, safflower, olive, soybean, and sunflower oils. Avocado. Seasoning and other foods  Herbs. Spices. Seasoning mixes without salt. Unsalted popcorn and pretzels. Fat-free sweets. What foods are not recommended? The items listed may not be a complete list. Talk with your dietitian about what dietary choices are best for you. Grains  Baked goods made with fat, such as croissants, muffins, or some breads. Dry pasta or rice meal packs. Vegetables  Creamed or fried vegetables. Vegetables in a cheese sauce. Regular canned vegetables (not low-sodium or reduced-sodium). Regular canned tomato sauce and paste (not low-sodium or reduced-sodium). Regular tomato and vegetable juice (not low-sodium or reduced-sodium). Angie Fava. Olives. Fruits  Canned fruit in a light or heavy syrup. Fried fruit. Fruit in cream or butter sauce. Meat and  other protein foods  Fatty cuts of meat. Ribs. Fried meat. Berniece Salines. Sausage. Bologna and other processed lunch meats. Salami. Fatback. Hotdogs. Bratwurst. Salted nuts and seeds. Canned beans with added salt. Canned or smoked fish. Whole eggs or egg yolks. Chicken or [place] with skin. Dairy  Whole or 2% milk, cream, and half-and-half. Whole or full-fat cream cheese. Whole-fat or sweetened yogurt. Full-fat cheese. Nondairy creamers. Whipped toppings. Processed cheese and cheese spreads. Fats and oils  Butter. Stick margarine. Lard. Shortening. Ghee. Bacon fat. Tropical oils, such as coconut, palm kernel, or palm oil. Seasoning and other foods  Salted popcorn and pretzels. Onion salt, garlic salt, seasoned salt, table salt, and sea salt. Worcestershire sauce. Tartar sauce. Barbecue sauce. Teriyaki sauce. Soy sauce, including reduced-sodium. Steak sauce. Canned and packaged gravies. Fish sauce. Oyster sauce. Cocktail sauce. Horseradish that you find on the shelf. Ketchup. Mustard. Meat flavorings and tenderizers. Bouillon cubes. Hot sauce and Tabasco sauce. Premade or packaged marinades. Premade or packaged taco seasonings. Relishes. Regular salad dressings. Where to find more information:  National Heart, Lung, and [place]: https://wilson-eaton.com/  American Heart Association: www.heart.org Summary  The DASH eating plan is a  healthy eating plan that has been shown to reduce high blood pressure (hypertension). It may also reduce your risk for type 2 diabetes, heart disease, and stroke.  With the DASH eating plan, you should limit salt (sodium) intake to 2,300 mg a day. If you have hypertension, you may need to reduce your sodium intake to 1,500 mg a day.  When on the DASH eating plan, aim to eat more fresh fruits and vegetables, whole grains, lean proteins, low-fat dairy, and heart-healthy fats.  Work with your health care provider or diet and nutrition specialist (dietitian) to adjust your eating  plan to your individual calorie needs. This information is not intended to replace advice given to you by your health care provider. Make sure you discuss any questions you have with your health care provider. Document Released: 07/22/2011 Document Revised: 07/26/2016 Document Reviewed: 07/26/2016 Elsevier Interactive Patient Education  2017 [employer].   IF you received an x-ray today, you will receive an invoice from [facility]. Please contact [facility] at [phone] with questions or concerns regarding your invoice.   IF you received labwork today, you will receive an invoice from [facility]. Please contact [facility] at [phone] with questions or concerns regarding your invoice.   Our billing staff will not be able to assist you with questions regarding bills from these companies.  You will be contacted with the lab results as soon as they are available. The fastest way to get your results is to activate your My Chart account. Instructions are located on the last page of this paperwork. If you have not heard from [place] regarding the results in 2 weeks, please contact this office.

## 2016-12-02 NOTE — Progress Notes (Signed)
Johnny Andrews  MRN: [id number] DOB: 15-Sep-1960  PCP: Reginia Forts, MD  Subjective:  Pt is a 56 year old male PMH HTN, OSA, DM, hypothyroidism, anxiety, gout, who presents to clinic for medication refill.   He is out of his lisinopril, Diclofenac and topiramate. These medications have been previously managed by [facility], Dr. Rolla Etienne. Pt is having a difficulty time getting timely refills. He is not happy with their services and would like to receive medications here.  He takes these medications daily. Last dose last night, next dose is this evening. Denies side effects.  Today's blood pressure is 168/82.   He is due for an annual exam - as he has multiple care gaps. Last annual was several years ago.   H/o OA - knees are flaring.   Review of Systems  Constitutional: Negative for chills and diaphoresis.  Respiratory: Negative for cough, chest tightness, shortness of breath and wheezing.   Cardiovascular: Negative for chest pain, palpitations and leg swelling.  Gastrointestinal: Negative for diarrhea, nausea and vomiting.  Musculoskeletal: Positive for arthralgias (b/l knee pain). Negative for neck pain.  Neurological: Negative for dizziness, syncope, light-headedness and headaches.  Psychiatric/Behavioral: Negative for sleep disturbance. The patient is not nervous/anxious.     Patient Active Problem List   Diagnosis Date Noted  . Pure hypercholesterolemia 09/13/2016  . Spinal stenosis of lumbar region 11/03/2015  . Paresthesia 09/15/2015  . Abnormality of gait 09/15/2015  . Restrictive lung disease 05/05/2015  . Asbestos exposure 05/05/2015  . Dyspnea 03/14/2015  . Numbness 05/31/2014  . Encounter for long-term (current) use of other medications 02/05/2013  . 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 Hypogonadism male 02/05/2013  . Low back pain 08/28/2012  . Elevated PSA 11/30/2011  . OTHER DISEASES OF LUNG NOT ELSEWHERE CLASSIFIED 07/20/2010  . GOUT  04/17/2010  . SMOKER 04/17/2010  . Sleep apnea 08/22/2009  . Alcohol abuse 10/24/2008  . Sebaceous cyst 08/01/2008  . Hypothyroidism 09/19/2007  . Diabetes ([place]) 09/19/2007  . ANXIETY 09/19/2007  . Essential hypertension 09/19/2007  . OSTEOARTHRITIS 09/19/2007    Current Outpatient Prescriptions on File Prior to Visit  Medication Sig Dispense Refill  . BD ULTRA-FINE PEN NEEDLES 29G X 12.7MM MISC USE TWO CHECK BLOOD SUGAR TWICE A DAY. APPOINTMENT NEEDED FOR FURTHER REFILLS 100 each 0  . glucose blood (ONE TOUCH ULTRA TEST) test strip Two times a day dx 250.01     . insulin aspart (NOVOLOG FLEXPEN) 100 UNIT/ML FlexPen Inject 75 Units into the skin 3 (three) times daily with meals.     . Insulin Degludec (TRESIBA FLEXTOUCH) 100 UNIT/ML SOPN Inject 100 Units into the skin 2 (two) times daily.    Marland Kitchen levothyroxine (SYNTHROID, LEVOTHROID) 125 MCG tablet 2 tabs daily 60 tablet 11  . lisinopril (PRINIVIL,ZESTRIL) 5 MG tablet Take 1 tablet (5 mg total) by mouth daily. (Patient taking differently: Take 10 mg by mouth daily. ) 90 tablet 1  . metFORMIN (GLUCOPHAGE-XR) 500 MG 24 hr tablet Take 1,000 mg by mouth 2 (two) times daily.     Marland Kitchen Respiratory Therapy Supplies (ADULT MASK) MISC 2 Devices by Does not apply route once. 2 each 0  . topiramate (TOPAMAX) 50 MG tablet Take 100 mg by mouth at bedtime.      No current facility-administered medications on file prior to visit.     No Known Allergies   Objective:  BP (!) 174/81   Pulse 89   Temp 98.3 F (36.8 C) (Oral)  Resp 18   Ht '6\' 3"'  (1.905 m)   Wt (!) 404 lb (183.3 kg)   SpO2 93%   BMI 50.50 kg/m   Physical Exam  Constitutional: He is oriented to person, place, and time and well-developed, well-nourished, and in no distress. No distress.  Obese  Cardiovascular: Normal rate, regular rhythm and normal heart sounds.   Neurological: He is alert and oriented to person, place, and time. GCS score is 15.  Skin: Skin is warm and dry.    Psychiatric: Mood, memory, affect and judgment normal.  Vitals reviewed.  Lab Results  Component Value Date   HGBA1C 9.8 03/05/2015    Assessment and Plan :  1. Encounter for medication management 2. Essential hypertension 3. Elevated blood pressure reading - lisinopril (PRINIVIL,ZESTRIL) 10 MG tablet; Take 1 tablet (10 mg total) by mouth daily.  Dispense: 30 tablet; Refill: 3 - Recheck vitals - Pts blood pressure remains elevated after recheck, will increase Lisinopril to 46m. Encouraged DASH diet and exercise. Will order future labs so pt can RTC for lab only visit prior to his annual exam. He is overdue for general health maintenance. RTC in a few weeks for blood pressure recheck and annual exam.   4. Screening, lipid - Lipid panel; Future  5. Screening for diabetes mellitus - CMP14+EGFR; Future - Hemoglobin A1c; Future  6. Screening, anemia, deficiency, iron - CBC with Differential/Platelet; Future  7. Screening for thyroid disorder - Thyroid Panel With TSH; Future  8. Chronic pain of both knees - diclofenac (VOLTAREN) 75 MG EC tablet; Take 1 tablet (75 mg total) by mouth 2 (two) times daily.  Dispense: 30 tablet; Refill: 1  9. Other polyneuropathy - topiramate (TOPAMAX) 50 MG tablet; Take 2 tablets (100 mg total) by mouth at bedtime.  Dispense: 30 tablet; Refill: 1   Whitney Tangie Stay, PA-C  [facility]4/19/2018 4:44 PM

## 2016-12-14 ENCOUNTER — Ambulatory Visit: Payer: BC Managed Care – PPO | Admitting: Family Medicine

## 2017-01-05 ENCOUNTER — Encounter: Payer: Self-pay | Admitting: Family Medicine

## 2017-01-05 ENCOUNTER — Ambulatory Visit (INDEPENDENT_AMBULATORY_CARE_PROVIDER_SITE_OTHER): Payer: BC Managed Care – PPO | Admitting: Family Medicine

## 2017-01-05 VITALS — BP 131/87 | HR 105 | Temp 97.9°F | Resp 18 | Ht 75.0 in | Wt 393.4 lb

## 2017-01-05 DIAGNOSIS — Z13220 Encounter for screening for lipoid disorders: Secondary | ICD-10-CM

## 2017-01-05 DIAGNOSIS — E7800 Pure hypercholesterolemia, unspecified: Secondary | ICD-10-CM | POA: Diagnosis not present

## 2017-01-05 DIAGNOSIS — I10 Essential (primary) hypertension: Secondary | ICD-10-CM | POA: Diagnosis not present

## 2017-01-05 DIAGNOSIS — B353 Tinea pedis: Secondary | ICD-10-CM

## 2017-01-05 DIAGNOSIS — Z130 Encounter for screening for diseases of the blood and blood-forming organs and certain disorders involving the immune mechanism: Secondary | ICD-10-CM

## 2017-01-05 DIAGNOSIS — J984 Other disorders of lung: Secondary | ICD-10-CM | POA: Diagnosis not present

## 2017-01-05 DIAGNOSIS — Z114 Encounter for screening for human immunodeficiency virus [HIV]: Secondary | ICD-10-CM

## 2017-01-05 DIAGNOSIS — G4733 Obstructive sleep apnea (adult) (pediatric): Secondary | ICD-10-CM | POA: Diagnosis not present

## 2017-01-05 DIAGNOSIS — R972 Elevated prostate specific antigen [PSA]: Secondary | ICD-10-CM | POA: Diagnosis not present

## 2017-01-05 DIAGNOSIS — Z1329 Encounter for screening for other suspected endocrine disorder: Secondary | ICD-10-CM

## 2017-01-05 DIAGNOSIS — M1A09X0 Idiopathic chronic gout, multiple sites, without tophus (tophi): Secondary | ICD-10-CM

## 2017-01-05 DIAGNOSIS — Z0000 Encounter for general adult medical examination without abnormal findings: Secondary | ICD-10-CM

## 2017-01-05 DIAGNOSIS — E034 Atrophy of thyroid (acquired): Secondary | ICD-10-CM

## 2017-01-05 DIAGNOSIS — E1142 Type 2 diabetes mellitus with diabetic polyneuropathy: Secondary | ICD-10-CM

## 2017-01-05 DIAGNOSIS — Z131 Encounter for screening for diabetes mellitus: Secondary | ICD-10-CM

## 2017-01-05 DIAGNOSIS — Z1159 Encounter for screening for other viral diseases: Secondary | ICD-10-CM

## 2017-01-05 DIAGNOSIS — N529 Male erectile dysfunction, unspecified: Secondary | ICD-10-CM

## 2017-01-05 DIAGNOSIS — Z794 Long term (current) use of insulin: Secondary | ICD-10-CM

## 2017-01-05 DIAGNOSIS — Z1211 Encounter for screening for malignant neoplasm of colon: Secondary | ICD-10-CM

## 2017-01-05 DIAGNOSIS — E291 Testicular hypofunction: Secondary | ICD-10-CM

## 2017-01-05 DIAGNOSIS — M48062 Spinal stenosis, lumbar region with neurogenic claudication: Secondary | ICD-10-CM

## 2017-01-05 LAB — POCT URINALYSIS DIP (MANUAL ENTRY)
BILIRUBIN UA: NEGATIVE mg/dL
Bilirubin, UA: NEGATIVE
Glucose, UA: >=1000 mg/dL — AB
Leukocytes, UA: NEGATIVE
Nitrite, UA: NEGATIVE
PH UA: 5 (ref 5.0–8.0)
RBC UA: NEGATIVE
SPEC GRAV UA: 1.025 (ref 1.010–1.025)
UROBILINOGEN UA: 0.2 U/dL

## 2017-01-05 MED ORDER — LISINOPRIL 20 MG PO TABS: 20.0000 mg | ORAL_TABLET | Freq: Every day | ORAL | 1 refills | Status: DC

## 2017-01-05 MED ORDER — GLUCOSE BLOOD VI STRP: ORAL_STRIP | 5 refills | Status: DC

## 2017-01-05 MED ORDER — TERBINAFINE HCL 250 MG PO TABS: 250.0000 mg | ORAL_TABLET | Freq: Every day | ORAL | 2 refills | Status: DC

## 2017-01-05 NOTE — Progress Notes (Signed)
Subjective:    Patient ID: Johnny Andrews, male    DOB: 07-22-1961, 56 y.o.   MRN: [id number]  01/05/2017  Annual Exam (discussed with patient Colonoscopy) and Medication Refill (Ultra2 lancents(kit))   HPI This 56 y.o. male presents for Complete Physical Examination.  Last physical:  08-20-2014 Colonoscopy:  2007; referred at last visit for repeat.  Last colonoscopy completed at [facility].  PSA: 2015   Immunization History  Administered Date(s) Administered  . Influenza Split 06/23/2012  . Influenza,inj,Quad PF,36+ Mos 05/31/2016  . Pneumococcal Polysaccharide-23 12/13/2011  . Td 10/24/2008   BP Readings from Last 3 Encounters:  01/05/17 131/87  12/02/16 (!) 168/82  10/05/16 (!) 158/94   Wt Readings from Last 3 Encounters:  01/05/17 (!) 393 lb 6.4 oz (178.4 kg)  12/02/16 (!) 404 lb (183.3 kg)  10/05/16 (!) 405 lb (183.7 kg)    Foggy during the day since adding [place]; increased to 53m and now 20 mg.  Sugars have really been improved.HgbA1c 7.7.Endocrinology recommended stopping lunch time insulin Novolog.  Taking Novolog 75units every morning and 100units with supper.   Review of Systems  Constitutional: Negative for activity change, appetite change, chills, diaphoresis, fatigue, fever and unexpected weight change.  HENT: Negative for congestion, dental problem, drooling, ear discharge, ear pain, facial swelling, hearing loss, mouth sores, nosebleeds, postnasal drip, rhinorrhea, sinus pressure, sneezing, sore throat, tinnitus, trouble swallowing and voice change.   Eyes: Negative for photophobia, pain, discharge, redness, itching and visual disturbance.  Respiratory: Negative for apnea, cough, choking, chest tightness, shortness of breath, wheezing and stridor.   Cardiovascular: Negative for chest pain, palpitations and leg swelling.  Gastrointestinal: Negative for abdominal pain, blood in stool, constipation, diarrhea, nausea and vomiting.  Endocrine: Negative for  cold intolerance, heat intolerance, polydipsia, polyphagia and polyuria.  Genitourinary: Negative for decreased urine volume, difficulty urinating, discharge, dysuria, enuresis, flank pain, frequency, genital sores, hematuria, penile pain, penile swelling, scrotal swelling, testicular pain and urgency.       Nocturia x 0.  Urinary stream weaker a lot.  Urinating 15 times a day small amount.  Musculoskeletal: Negative for arthralgias, back pain, gait problem, joint swelling, myalgias, neck pain and neck stiffness.  Skin: Negative for color change, pallor, rash and wound.  Allergic/Immunologic: Negative for environmental allergies, food allergies and immunocompromised state.  Neurological: Negative for dizziness, tremors, seizures, syncope, facial asymmetry, speech difficulty, weakness, light-headedness, numbness and headaches.  Hematological: Negative for adenopathy. Does not bruise/bleed easily.  Psychiatric/Behavioral: Positive for sleep disturbance. Negative for agitation, behavioral problems, confusion, decreased concentration, dysphoric mood, hallucinations, self-injury and suicidal ideas. The patient is not nervous/anxious and is not hyperactive.        Bedtime 10:00pm; wakes up 6:30am.     Past Medical History:  Diagnosis Date  . ALCOHOL USE 10/24/2008  . ANXIETY 09/19/2007  . Cough 05/22/2008  . DIABETES MELLITUS, TYPE II 09/19/2007  . External thrombosed hemorrhoids 08/23/2008  . GOUT 04/17/2010  . HYPERLIPIDEMIA 09/19/2007  . HYPERTENSION 09/19/2007  . HYPOTHYROIDISM 09/19/2007  . Leg pain   . OSTEOARTHRITIS 09/19/2007  . OTHER DISEASES OF LUNG NOT ELSEWHERE CLASSIFIED 07/20/2010  . Rash and other nonspecific skin eruption 09/19/2007  . Restrictive lung disease   . SCROTAL ABSCESS 05/27/2010  . Sebaceous cyst 08/01/2008  . SLEEP APNEA 08/22/2009  . SMOKER 04/17/2010   Past Surgical History:  Procedure Laterality Date  . COLONOSCOPY    . NASAL SEPTUM SURGERY     No Known Allergies  Social  History   Social History  . Marital status: Married    Spouse name: N/A  . Number of children: 2  . Years of education: BS   Occupational History  . HV[employer]   Social History Main Topics  . Smoking status: Former Smoker    Packs/day: 0.50    Years: 30.00    Quit date: 11/05/2012  . Smokeless tobacco: Never Used     Comment: 30 years at most off and on smoking 03/14/15  . Alcohol use 0.0 oz/week     Comment: 2 drinks per day  . Drug use: Yes     Comment: Occasional Marijuana use  . Sexual activity: Not on file   Other Topics Concern  . Not on file   Social History Narrative   Originally from [place]. Has lived in [place], [place], [place], & [place] since 1993. 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 Currently has a dog & cats. Previously had a parrot (conure) 12 years ago for 1 year but in same house.  Has also had excessive exposure to pigeon feces. He has had very brief exposure to asbestos around 2000. No hot tub exposure. He has inhaled chemical fumes/gases/refridgerants through his work.      Marital status: married x 26 years      Children: 2 children (19, 18); no grandchildren      Lives: Lives at home with his wife      Employment: [employer]      Tobacco: none; quit in 2013; smoked x 1/2 ppd x 20 years      Alcohol:  10 drinks; usually 2 drinks per day and then skip two days.      Drugs:  None      Exercise: none; job is physically demanding      Right-handed.   1 cup caffeine daily.      Family History  Problem Relation Age of Onset  . Breast cancer Mother        Breast Cancer  . Parkinson's disease Mother   . Heart disease Father        CABG, stenting cardiac  . Cancer Brother 59       prostate cancer  . Lung disease Neg Hx   . Autoimmune disease Neg Hx        Objective:    BP 131/87   Pulse (!) 105   Temp 97.9 F (36.6 C) (Oral)   Resp 18   Ht 6' 3" (1.905 m)   Wt (!) 393 lb 6.4 oz (178.4 kg)    SpO2 96%   BMI 49.17 kg/m  Physical Exam  Constitutional: He is oriented to person, place, and time. He appears well-developed and well-nourished. No distress.  HENT:  Head: Normocephalic and atraumatic.  Right Ear: External ear normal.  Left Ear: External ear normal.  Nose: Nose normal.  Mouth/Throat: Oropharynx is clear and moist.  Eyes: Conjunctivae and EOM are normal. Pupils are equal, round, and reactive to light.  Neck: Normal range of motion. Neck supple. Carotid bruit is not present. No thyromegaly present.  Cardiovascular: Normal rate, regular rhythm, normal heart sounds and intact distal pulses.  Exam reveals no gallop and no friction rub.   No murmur heard. Pulmonary/Chest: Effort normal and breath sounds normal. He has no wheezes. He has no rales.  Abdominal: Soft. Bowel sounds are normal. He exhibits no distension and no  mass. There is no tenderness. There is no rebound and no guarding. Hernia confirmed negative in the right inguinal area and confirmed negative in the left inguinal area.  Genitourinary: Testes normal and penis normal.  Musculoskeletal:       Right shoulder: Normal.       Left shoulder: Normal.       Cervical back: Normal.  Lymphadenopathy:    He has no cervical adenopathy.       Right: No inguinal adenopathy present.       Left: No inguinal adenopathy present.  Neurological: He is alert and oriented to person, place, and time. He has normal reflexes. No cranial nerve deficit. He exhibits normal muscle tone. Coordination normal.  Skin: Skin is warm and dry. No rash noted. He is not diaphoretic.  Scaling diffusely B feet plantar surfaces with dystrophic nails scattered B feet toenails.  Psychiatric: He has a normal mood and affect. His behavior is normal. Judgment and thought content normal.   Results for orders placed or performed in visit on 01/05/17  PSA  Result Value Ref Range   Prostate Specific Ag, Serum 4.1 (H) 0.0 - 4.0 ng/mL  HIV antibody    Result Value Ref Range   HIV Screen 4th Generation wRfx Non Reactive Non Reactive  Hepatitis C antibody  Result Value Ref Range   Hep C Virus Ab <0.1 0.0 - 0.9 s/co ratio  Microalbumin, urine  Result Value Ref Range   Albumin, Urine 305.7 Not Estab. ug/mL  CMP14+EGFR  Result Value Ref Range   Glucose 71 65 - 99 mg/dL   BUN 22 6 - 24 mg/dL   Creatinine, Ser 0.81 0.76 - 1.27 mg/dL   GFR calc non Af Amer 100 >59 mL/min/1.73   GFR calc Af Amer 116 >59 mL/min/1.73   BUN/Creatinine Ratio 27 (H) 9 - 20   Sodium 142 134 - 144 mmol/L   Potassium 4.3 3.5 - 5.2 mmol/L   Chloride 101 96 - 106 mmol/L   CO2 24 18 - 29 mmol/L   Calcium 10.0 8.7 - 10.2 mg/dL   Total Protein 7.1 6.0 - 8.5 g/dL   Albumin 4.5 3.5 - 5.5 g/dL   Globulin, Total 2.6 1.5 - 4.5 g/dL   Albumin/Globulin Ratio 1.7 1.2 - 2.2   Bilirubin Total 0.5 0.0 - 1.2 mg/dL   Alkaline Phosphatase 84 39 - 117 IU/L   AST 25 0 - 40 IU/L   ALT 36 0 - 44 IU/L  CBC with Differential/Platelet  Result Value Ref Range   WBC 10.4 3.4 - 10.8 x10E3/uL   RBC 5.74 4.14 - 5.80 x10E6/uL   Hemoglobin 16.3 13.0 - 17.7 g/dL   Hematocrit 49.2 37.5 - 51.0 %   MCV 86 79 - 97 fL   MCH 28.4 26.6 - 33.0 pg   MCHC 33.1 31.5 - 35.7 g/dL   RDW 15.3 12.3 - 15.4 %   Platelets 256 150 - 379 x10E3/uL   Neutrophils 61 Not Estab. %   Lymphs 26 Not Estab. %   Monocytes 9 Not Estab. %   Eos 3 Not Estab. %   Basos 1 Not Estab. %   Neutrophils Absolute 6.4 1.4 - 7.0 x10E3/uL   Lymphocytes Absolute 2.7 0.7 - 3.1 x10E3/uL   Monocytes Absolute 1.0 (H) 0.1 - 0.9 x10E3/uL   EOS (ABSOLUTE) 0.3 0.0 - 0.4 x10E3/uL   Basophils Absolute 0.1 0.0 - 0.2 x10E3/uL   Immature Granulocytes 0 Not Estab. %   Immature Grans (  Abs) 0.0 0.0 - 0.1 x10E3/uL  Thyroid Panel With TSH  Result Value Ref Range   TSH 1.530 0.450 - 4.500 uIU/mL   T4, Total 8.4 4.5 - 12.0 ug/dL   T3 Uptake Ratio 28 24 - 39 %   Free Thyroxine Index 2.4 1.2 - 4.9  Hemoglobin A1c  Result Value Ref Range    Hgb A1c MFr Bld 7.6 (H) 4.8 - 5.6 %   Est. average glucose Bld gHb Est-mCnc 171 mg/dL  Lipid panel  Result Value Ref Range   Cholesterol, Total 173 100 - 199 mg/dL   Triglycerides 278 (H) 0 - 149 mg/dL   HDL 41 >39 mg/dL   VLDL Cholesterol Cal 56 (H) 5 - 40 mg/dL   LDL Calculated 76 0 - 99 mg/dL   Chol/HDL Ratio 4.2 0.0 - 5.0 ratio  POCT urinalysis dipstick  Result Value Ref Range   Color, UA yellow yellow   Clarity, UA clear clear   Glucose, UA >=1,000 (A) negative mg/dL   Bilirubin, UA negative negative   Ketones, POC UA negative negative mg/dL   Spec Grav, UA 1.025 1.010 - 1.025   Blood, UA negative negative   pH, UA 5.0 5.0 - 8.0   Protein Ur, POC =100 (A) negative mg/dL   Urobilinogen, UA 0.2 0.2 or 1.0 E.U./dL   Nitrite, UA Negative Negative   Leukocytes, UA Negative Negative   Depression screen [facility] 2/9 01/05/2017 12/02/2016 08/31/2016 10/09/2015 09/01/2015  Decreased Interest 0 0 0 0 0  Down, Depressed, Hopeless 0 0 0 0 0  PHQ - 2 Score 0 0 0 0 0       Assessment & Plan:   1. Routine physical examination   2. Essential hypertension   3. Restrictive lung disease   4. Obstructive sleep apnea syndrome   5. Type 2 diabetes mellitus with diabetic polyneuropathy, with long-term current use of insulin ([place])   6. Hypogonadism male   7. Hypothyroidism due to acquired atrophy of thyroid   8. Impotence of organic origin   9. Pure hypercholesterolemia   10. Spinal stenosis of lumbar region with neurogenic claudication   11. Idiopathic chronic gout of multiple sites without tophus   12. Elevated PSA   13. Screening for HIV (human immunodeficiency virus)   14. Need for hepatitis C screening test   15. Screening for diabetes mellitus   16. Screening, anemia, deficiency, iron   17. Screening for thyroid disorder   18. Screening, lipid   19. Colon cancer screening   20. Tinea pedis of both feet    -anticipatory guidance provided --- exercise, weight loss, safe driving  practices, aspirin 34m daily. -obtain age appropriate screening labs and labs for chronic disease management. -treat tinea pedis with Lamisil; also with associated dystrophic nails consistent with onychomycosis.   Orders Placed This Encounter  Procedures  . PSA  . HIV antibody  . Hepatitis C antibody  . Microalbumin, urine  . Ambulatory referral to Gastroenterology    Referral Priority:   Routine    Referral Type:   Consultation    Referral Reason:   Specialty Services Required    Number of Visits Requested:   1  . POCT urinalysis dipstick   Meds ordered this encounter  Medications  . dapagliflozin propanediol (FARXIGA) 10 MG TABS tablet    Sig: Take 20 mg by mouth daily.  . simvastatin (ZOCOR) 20 MG tablet    Sig: Take 20 mg by mouth daily.  .Marland Kitchenlisinopril (PRINIVIL,ZESTRIL)  20 MG tablet    Sig: Take 1 tablet (20 mg total) by mouth daily.    Dispense:  90 tablet    Refill:  1  . diclofenac (VOLTAREN) 75 MG EC tablet    Sig: Take 75 mg by mouth.  . DISCONTD: diltiazem (TIAZAC) 180 MG 24 hr capsule    Sig: Take 180 mg by mouth daily.    Refill:  6  . TRESIBA FLEXTOUCH 200 UNIT/ML SOPN    Sig: INJECT 100 UNITS TWICE A DAY    Refill:  6  . levothyroxine (SYNTHROID, LEVOTHROID) 300 MCG tablet    Sig: Take 300 mcg by mouth daily.    Refill:  11  . terbinafine (LAMISIL) 250 MG tablet    Sig: Take 1 tablet (250 mg total) by mouth daily.    Dispense:  30 tablet    Refill:  2  . glucose blood (ONE TOUCH ULTRA TEST) test strip    Sig: Two times a day for ULTRA 2 ONE TOUCH    Dispense:  100 each    Refill:  5    Return in about 6 weeks (around 02/16/2017) for SKIN TAG REMOVAL.   [name] Elayne Guerin, M.D. Primary Care at [facility] [address] [place], [place]  [address] [phone] phone [phone] fax

## 2017-01-05 NOTE — Patient Instructions (Addendum)
IF you received an x-ray today, you will receive an invoice from [facility]. Please contact [facility] at [phone] with questions or concerns regarding your invoice.   IF you received labwork today, you will receive an invoice from [facility]. Please contact [facility] at [phone] with questions or concerns regarding your invoice.   Our billing staff will not be able to assist you with questions regarding bills from these companies.  You will be contacted with the lab results as soon as they are available. The fastest way to get your results is to activate your My Chart account. Instructions are located on the last page of this paperwork. If you have not heard from [place] regarding the results in 2 weeks, please contact this office.      Preventive Care 40-64 Years, Male Preventive care refers to lifestyle choices and visits with your health care provider that can promote health and wellness. What does preventive care include?  A yearly physical exam. This is also called an annual well check.  Dental exams once or twice a year.  Routine eye exams. Ask your health care provider how often you should have your eyes checked.  Personal lifestyle choices, including:  Daily care of your teeth and gums.  Regular physical activity.  Eating a healthy diet.  Avoiding tobacco and drug use.  Limiting alcohol use.  Practicing safe sex.  Taking low-dose aspirin every day starting at age 48. What happens during an annual well check? The services and screenings done by your health care provider during your annual well check will depend on your age, overall health, lifestyle risk factors, and family history of disease. Counseling  Your health care provider may ask you questions about your:  Alcohol use.  Tobacco use.  Drug use.  Emotional well-being.  Home and relationship well-being.  Sexual activity.  Eating habits.  Work and work  Statistician. Screening  You may have the following tests or measurements:  Height, weight, and BMI.  Blood pressure.  Lipid and cholesterol levels. These may be checked every 5 years, or more frequently if you are over 38 years old.  Skin check.  Lung cancer screening. You may have this screening every year starting at age 7 if you have a 30-pack-year history of smoking and currently smoke or have quit within the past 15 years.  Fecal occult blood test (FOBT) of the stool. You may have this test every year starting at age 53.  Flexible sigmoidoscopy or colonoscopy. You may have a sigmoidoscopy every 5 years or a colonoscopy every 10 years starting at age 56.  Prostate cancer screening. Recommendations will vary depending on your family history and other risks.  Hepatitis C blood test.  Hepatitis B blood test.  Sexually transmitted disease (STD) testing.  Diabetes screening. This is done by checking your blood sugar (glucose) after you have not eaten for a while (fasting). You may have this done every 1-3 years. Discuss your test results, treatment options, and if necessary, the need for more tests with your health care provider. Vaccines  Your health care provider may recommend certain vaccines, such as:  Influenza vaccine. This is recommended every year.  Tetanus, diphtheria, and acellular pertussis (Tdap, Td) vaccine. You may need a Td booster every 10 years.  Varicella vaccine. You may need this if you have not been vaccinated.  Zoster vaccine. You may need this after age 56.  Measles, mumps, and rubella (MMR) vaccine. You may need at least one  dose of MMR if you were born in 1957 or later. You may also need a second dose.  Pneumococcal 13-valent conjugate (PCV13) vaccine. You may need this if you have certain conditions and have not been vaccinated.  Pneumococcal polysaccharide (PPSV23) vaccine. You may need one or two doses if you smoke cigarettes or if you have  certain conditions.  Meningococcal vaccine. You may need this if you have certain conditions.  Hepatitis A vaccine. You may need this if you have certain conditions or if you travel or work in places where you may be exposed to hepatitis A.  Hepatitis B vaccine. You may need this if you have certain conditions or if you travel or work in places where you may be exposed to hepatitis B.  Haemophilus influenzae type b (Hib) vaccine. You may need this if you have certain risk factors. Talk to your health care provider about which screenings and vaccines you need and how often you need them. This information is not intended to replace advice given to you by your health care provider. Make sure you discuss any questions you have with your health care provider. Document Released: 08/29/2015 Document Revised: 04/21/2016 Document Reviewed: 06/03/2015 Elsevier Interactive Patient Education  2017 [employer].

## 2017-01-05 NOTE — Progress Notes (Signed)
   Subjective:    Patient ID: Johnny Andrews, male    DOB: 28-Mar-1961, 56 y.o.   MRN: [id number]  HPI    Review of Systems  Constitutional: Positive for fatigue.  Respiratory: Positive for shortness of breath.   Cardiovascular: Positive for leg swelling.  Genitourinary: Positive for frequency.  Musculoskeletal: Positive for gait problem.  Neurological: Positive for dizziness and weakness.       Objective:   Physical Exam        Assessment & Plan:

## 2017-01-06 LAB — CMP14+EGFR
ALT: 36 IU/L (ref 0–44)
AST: 25 IU/L (ref 0–40)
Albumin/Globulin Ratio: 1.7 (ref 1.2–2.2)
Albumin: 4.5 g/dL (ref 3.5–5.5)
Alkaline Phosphatase: 84 IU/L (ref 39–117)
BUN/Creatinine Ratio: 27 — ABNORMAL HIGH (ref 9–20)
BUN: 22 mg/dL (ref 6–24)
Bilirubin Total: 0.5 mg/dL (ref 0.0–1.2)
CO2: 24 mmol/L (ref 18–29)
Calcium: 10 mg/dL (ref 8.7–10.2)
Chloride: 101 mmol/L (ref 96–106)
Creatinine, Ser: 0.81 mg/dL (ref 0.76–1.27)
GFR calc Af Amer: 116 mL/min/{1.73_m2} (ref >59)
GFR calc non Af Amer: 100 mL/min/{1.73_m2} (ref >59)
Globulin, Total: 2.6 g/dL (ref 1.5–4.5)
Glucose: 71 mg/dL (ref 65–99)
Potassium: 4.3 mmol/L (ref 3.5–5.2)
Sodium: 142 mmol/L (ref 134–144)
Total Protein: 7.1 g/dL (ref 6.0–8.5)

## 2017-01-06 LAB — HEMOGLOBIN A1C
Est. average glucose Bld gHb Est-mCnc: 171 mg/dL
Hgb A1c MFr Bld: 7.6 % — ABNORMAL HIGH (ref 4.8–5.6)

## 2017-01-06 LAB — CBC WITH DIFFERENTIAL/PLATELET
Basophils Absolute: 0.1 10*3/uL (ref 0.0–0.2)
Basos: 1 %
EOS (ABSOLUTE): 0.3 10*3/uL (ref 0.0–0.4)
Eos: 3 %
Hematocrit: 49.2 % (ref 37.5–51.0)
Hemoglobin: 16.3 g/dL (ref 13.0–17.7)
Immature Grans (Abs): 0 10*3/uL (ref 0.0–0.1)
Immature Granulocytes: 0 %
Lymphocytes Absolute: 2.7 10*3/uL (ref 0.7–3.1)
Lymphs: 26 %
MCH: 28.4 pg (ref 26.6–33.0)
MCHC: 33.1 g/dL (ref 31.5–35.7)
MCV: 86 fL (ref 79–97)
Monocytes Absolute: 1 10*3/uL — ABNORMAL HIGH (ref 0.1–0.9)
Monocytes: 9 %
Neutrophils Absolute: 6.4 10*3/uL (ref 1.4–7.0)
Neutrophils: 61 %
Platelets: 256 10*3/uL (ref 150–379)
RBC: 5.74 x10E6/uL (ref 4.14–5.80)
RDW: 15.3 % (ref 12.3–15.4)
WBC: 10.4 10*3/uL (ref 3.4–10.8)

## 2017-01-06 LAB — HIV ANTIBODY (ROUTINE TESTING W REFLEX): HIV SCREEN 4TH GENERATION: NONREACTIVE

## 2017-01-06 LAB — PSA: Prostate Specific Ag, Serum: 4.1 ng/mL — ABNORMAL HIGH (ref 0.0–4.0)

## 2017-01-06 LAB — THYROID PANEL WITH TSH
Free Thyroxine Index: 2.4 (ref 1.2–4.9)
T3 Uptake Ratio: 28 % (ref 24–39)
T4, Total: 8.4 ug/dL (ref 4.5–12.0)
TSH: 1.53 u[IU]/mL (ref 0.450–4.500)

## 2017-01-06 LAB — LIPID PANEL
Chol/HDL Ratio: 4.2 ratio (ref 0.0–5.0)
Cholesterol, Total: 173 mg/dL (ref 100–199)
HDL: 41 mg/dL (ref >39)
LDL Calculated: 76 mg/dL (ref 0–99)
Triglycerides: 278 mg/dL — ABNORMAL HIGH (ref 0–149)
VLDL Cholesterol Cal: 56 mg/dL — ABNORMAL HIGH (ref 5–40)

## 2017-01-06 LAB — HEPATITIS C ANTIBODY

## 2017-01-06 LAB — MICROALBUMIN, URINE: Microalbumin, Urine: 305.7 ug/mL

## 2017-01-11 NOTE — Progress Notes (Signed)
Lab results

## 2017-01-31 ENCOUNTER — Telehealth: Payer: Self-pay | Admitting: Family Medicine

## 2017-01-31 ENCOUNTER — Other Ambulatory Visit: Payer: Self-pay | Admitting: Emergency Medicine

## 2017-01-31 NOTE — Telephone Encounter (Signed)
Called pharmacy to change quantity to #180 tablets, no refills Marcelline DeistFarxiga only comes in 5/10 mg tabs.

## 2017-01-31 NOTE — Telephone Encounter (Signed)
Pt is stating that he needs a refill on his farxiga and if [name] is changing his dosage to 20 mg instead of 10mg  he needs this change corrected at the pharmacy cause they have it as 10mg    Best number [phone]

## 2017-02-13 ENCOUNTER — Encounter: Payer: Self-pay | Admitting: Family Medicine

## 2017-02-13 NOTE — Addendum Note (Signed)
Addended by: Ethelda ChickSMITH, Joe Tanney M on: 02/13/2017 04:12 PM   Modules accepted: Orders

## 2017-02-14 NOTE — Progress Notes (Signed)
Letter  

## 2017-02-22 ENCOUNTER — Ambulatory Visit (INDEPENDENT_AMBULATORY_CARE_PROVIDER_SITE_OTHER): Payer: BC Managed Care – PPO | Admitting: Family Medicine

## 2017-02-22 ENCOUNTER — Encounter: Payer: Self-pay | Admitting: Family Medicine

## 2017-02-22 VITALS — BP 144/85 | HR 99 | Temp 98.4°F | Resp 16 | Ht 75.0 in | Wt 398.0 lb

## 2017-02-22 DIAGNOSIS — L603 Nail dystrophy: Secondary | ICD-10-CM | POA: Diagnosis not present

## 2017-02-22 DIAGNOSIS — Z8042 Family history of malignant neoplasm of prostate: Secondary | ICD-10-CM

## 2017-02-22 DIAGNOSIS — R972 Elevated prostate specific antigen [PSA]: Secondary | ICD-10-CM

## 2017-02-22 DIAGNOSIS — Q828 Other specified congenital malformations of skin: Secondary | ICD-10-CM

## 2017-02-22 NOTE — Patient Instructions (Signed)
pc    IF you received an x-ray today, you will receive an invoice from [facility]. Please contact [facility] at [phone] with questions or concerns regarding your invoice.   IF you received labwork today, you will receive an invoice from [facility]. Please contact [facility] at [phone] with questions or concerns regarding your invoice.   Our billing staff will not be able to assist you with questions regarding bills from these companies.  You will be contacted with the lab results as soon as they are available. The fastest way to get your results is to activate your My Chart account. Instructions are located on the last page of this paperwork. If you have not heard from us regarding the results in 2 weeks, please contact this office.     

## 2017-02-22 NOTE — Progress Notes (Signed)
Subjective:    Patient ID: Johnny Andrews, male    DOB: Jan 14, 1961, 56 y.o.   MRN: [id number]  02/22/2017  Follow-up (wants skin tags removed, liver checked due to terbenifine)   HPI This 56 y.o. male presents for evaluation of skin tag removal and follow-up of initiating Lamisil therapy for tinea pedis and onychomycosis.  Tolerating Lamisil without side effects.  Also wants to discuss elevated PSA; brother with prostate cancer; now recalls that had elevated PSA a few years ago and s/p urology consultation.     Review of Systems  Constitutional: Negative for activity change, appetite change, chills, diaphoresis, fatigue and fever.  Respiratory: Negative for cough and shortness of breath.   Cardiovascular: Negative for chest pain, palpitations and leg swelling.  Gastrointestinal: Negative for abdominal pain, diarrhea, nausea and vomiting.  Endocrine: Negative for cold intolerance, heat intolerance, polydipsia, polyphagia and polyuria.  Genitourinary: Negative for decreased urine volume, dysuria, frequency, hematuria and urgency.  Skin: Negative for color change, rash and wound.  Neurological: Negative for dizziness, tremors, seizures, syncope, facial asymmetry, speech difficulty, weakness, light-headedness, numbness and headaches.  Psychiatric/Behavioral: Negative for dysphoric mood and sleep disturbance. The patient is not nervous/anxious.     Past Medical History:  Diagnosis Date  . ALCOHOL USE 10/24/2008  . ANXIETY 09/19/2007  . Cough 05/22/2008  . DIABETES MELLITUS, TYPE II 09/19/2007  . External thrombosed hemorrhoids 08/23/2008  . GOUT 04/17/2010  . HYPERLIPIDEMIA 09/19/2007  . HYPERTENSION 09/19/2007  . HYPOTHYROIDISM 09/19/2007  . Leg pain   . OSTEOARTHRITIS 09/19/2007  . OTHER DISEASES OF LUNG NOT ELSEWHERE CLASSIFIED 07/20/2010  . Rash and other nonspecific skin eruption 09/19/2007  . Restrictive lung disease   . SCROTAL ABSCESS 05/27/2010  . Sebaceous cyst 08/01/2008  . SLEEP APNEA  08/22/2009  . SMOKER 04/17/2010   Past Surgical History:  Procedure Laterality Date  . COLONOSCOPY    . NASAL SEPTUM SURGERY     No Known Allergies  Social History   Social History  . Marital status: Married    Spouse name: N/A  . Number of children: 2  . Years of education: BS   Occupational History  . [employer]   Social History Main Topics  . Smoking status: Former Smoker    Packs/day: 0.50    Years: 30.00    Quit date: 11/05/2012  . Smokeless tobacco: Never Used     Comment: 30 years at most off and on smoking 03/14/15  . Alcohol use 0.0 oz/week     Comment: 2 drinks per day  . Drug use: Yes     Comment: Occasional Marijuana use  . Sexual activity: Not on file   Other Topics Concern  . Not on file   Social History Narrative   Originally from [place]. Has lived in [place], [place], [place], & [place] since 1993. 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 Currently has a dog & cats. Previously had a parrot (conure) 12 years ago for 1 year but in same house.  Has also had excessive exposure to pigeon feces. He has had very brief exposure to asbestos around 2000. No hot tub exposure. He has inhaled chemical fumes/gases/refridgerants through his work.      Marital status: married x 26 years      Children: 2 children (19, 73); no grandchildren      Lives: Lives at home with his wife  Employment: [employer]      Tobacco: none; quit in 2013; smoked x 1/2 ppd x 20 years      Alcohol:  10 drinks; usually 2 drinks per day and then skip two days.      Drugs:  None      Exercise: none; job is physically demanding      Right-handed.   1 cup caffeine daily.      Family History  Problem Relation Age of Onset  . Breast cancer Mother        Breast Cancer  . Parkinson's disease Mother   . Heart disease Father        CABG, stenting cardiac  . Cancer Brother [id number]       prostate cancer  . Lung disease Neg Hx   . Autoimmune disease  Neg Hx        Objective:    BP (!) 144/85 (BP Location: Right Arm, Patient Position: Sitting, Cuff Size: Large)   Pulse 99   Temp 98.4 F (36.9 C) (Oral)   Resp 16   Ht 6\' 3"  (1.905 m)   Wt (!) 398 lb (180.5 kg)   SpO2 98%   BMI 49.75 kg/m  Physical Exam  Constitutional: He is oriented to person, place, and time. He appears well-developed and well-nourished. No distress.  HENT:  Head: Normocephalic and atraumatic.  Eyes: Conjunctivae and EOM are normal. Pupils are equal, round, and reactive to light.  Neck: Normal range of motion. Neck supple. Carotid bruit is not present. No thyromegaly present.  Cardiovascular: Normal rate, regular rhythm, normal heart sounds and intact distal pulses.  Exam reveals no gallop and no friction rub.   No murmur heard. Pulmonary/Chest: Effort normal and breath sounds normal. He has no wheezes. He has no rales.  Abdominal: Soft. Bowel sounds are normal. He exhibits no distension and no mass. There is no tenderness. There is no rebound and no guarding.  Lymphadenopathy:    He has no cervical adenopathy.  Neurological: He is alert and oriented to person, place, and time. No cranial nerve deficit.  Skin: Skin is warm and dry. No rash noted. He is not diaphoretic.  Multiple skin tags along neck line, axilla, and lower abdomen.  Psychiatric: He has a normal mood and affect. His behavior is normal.  Nursing note and vitals reviewed.  PROCEDURE NOTE: VERBAL CONSENT OBTAINED; 15 SKIN TAGS EXCISED WITH STERILE SCISSORS.  SILVER NITRATE ADMINISTERED WITH GOOD HEMOSTASIS.       Assessment & Plan:   1. Dystrophic nail   2. Accessory skin tags   3. Elevated PSA    -tolerating Lamisil for tinea pedis and dystrophic nails; obtain CMET.  Continue with six additional weeks of Lamisil if LFTs normal. -recurrent elevated PSA; brother also with prostate cancer; pt agreeable to urology consultation. -s/p resection of 15 skin tags along neck and waistline.  Local  wound care; pt tolerated procedure well.   Orders Placed This Encounter  Procedures  . Comprehensive metabolic panel   No orders of the defined types were placed in this encounter.   No Follow-up on file.   Josclyn Rosales Paulita FujitaMartin Nira Visscher, M.D. [facility] at [facility]  [place] [facility] [address] [place], [place]  [id number] [phone] phone [phone] fax

## 2017-02-24 LAB — COMPREHENSIVE METABOLIC PANEL
ALT: 28 IU/L (ref 0–44)
AST: 25 IU/L (ref 0–40)
Albumin/Globulin Ratio: 1.4 (ref 1.2–2.2)
Albumin: 4.2 g/dL (ref 3.5–5.5)
Alkaline Phosphatase: 92 IU/L (ref 39–117)
BUN/Creatinine Ratio: 20 (ref 9–20)
BUN: 14 mg/dL (ref 6–24)
Bilirubin Total: 0.6 mg/dL (ref 0.0–1.2)
CALCIUM: 9.6 mg/dL (ref 8.7–10.2)
CO2: 14 mmol/L — AB (ref 20–29)
Chloride: 102 mmol/L (ref 96–106)
Creatinine, Ser: 0.71 mg/dL — ABNORMAL LOW (ref 0.76–1.27)
GFR, EST AFRICAN AMERICAN: 121 mL/min/{1.73_m2} (ref >59)
GFR, EST NON AFRICAN AMERICAN: 105 mL/min/{1.73_m2} (ref >59)
GLUCOSE: 212 mg/dL — AB (ref 65–99)
Globulin, Total: 2.9 g/dL (ref 1.5–4.5)
Potassium: 4.9 mmol/L (ref 3.5–5.2)
Sodium: 137 mmol/L (ref 134–144)
Total Protein: 7.1 g/dL (ref 6.0–8.5)

## 2017-02-26 ENCOUNTER — Other Ambulatory Visit
Admission: RE | Admit: 2017-02-26 | Discharge: 2017-02-26 | Disposition: A | Discharge status: Home / Self Care | Payer: BC Managed Care – PPO | Source: Other Acute Inpatient Hospital | Attending: Family Medicine | Admitting: Family Medicine

## 2017-02-26 ENCOUNTER — Other Ambulatory Visit: Payer: BC Managed Care – PPO | Admitting: Emergency Medicine

## 2017-02-26 ENCOUNTER — Other Ambulatory Visit: Payer: Self-pay | Admitting: Family Medicine

## 2017-02-26 DIAGNOSIS — E872 Acidosis, unspecified: Secondary | ICD-10-CM

## 2017-02-26 LAB — COMPREHENSIVE METABOLIC PANEL
ALBUMIN: 4.4 g/dL (ref 3.5–5.5)
ALT: 33 IU/L (ref 0–44)
AST: 21 IU/L (ref 0–40)
Albumin/Globulin Ratio: 1.8 (ref 1.2–2.2)
Alkaline Phosphatase: 101 IU/L (ref 39–117)
BILIRUBIN TOTAL: 0.3 mg/dL (ref 0.0–1.2)
BUN/Creatinine Ratio: 24 — ABNORMAL HIGH (ref 9–20)
BUN: 19 mg/dL (ref 6–24)
CHLORIDE: 100 mmol/L (ref 96–106)
CO2: 21 mmol/L (ref 20–29)
CREATININE: 0.79 mg/dL (ref 0.76–1.27)
Calcium: 9.3 mg/dL (ref 8.7–10.2)
GFR calc non Af Amer: 100 mL/min/{1.73_m2} (ref >59)
GFR, EST AFRICAN AMERICAN: 116 mL/min/{1.73_m2} (ref >59)
GLUCOSE: 138 mg/dL — AB (ref 65–99)
Globulin, Total: 2.4 g/dL (ref 1.5–4.5)
Potassium: 4.5 mmol/L (ref 3.5–5.2)
Sodium: 138 mmol/L (ref 134–144)
TOTAL PROTEIN: 6.8 g/dL (ref 6.0–8.5)

## 2017-02-26 LAB — LACTIC ACID, PLASMA: LACTIC ACID, VENOUS: 2.1 mmol/L — AB (ref 0.5–1.9)

## 2017-02-28 LAB — LACTIC ACID, PLASMA

## 2017-03-06 ENCOUNTER — Encounter: Payer: Self-pay | Admitting: Family Medicine

## 2017-03-06 DIAGNOSIS — Z8042 Family history of malignant neoplasm of prostate: Secondary | ICD-10-CM | POA: Insufficient documentation

## 2017-03-30 ENCOUNTER — Other Ambulatory Visit: Payer: Self-pay | Admitting: Cardiovascular Disease

## 2017-03-30 DIAGNOSIS — I739 Peripheral vascular disease, unspecified: Secondary | ICD-10-CM

## 2017-03-30 DIAGNOSIS — I10 Essential (primary) hypertension: Secondary | ICD-10-CM

## 2017-05-15 ENCOUNTER — Other Ambulatory Visit: Payer: Self-pay | Admitting: Family Medicine

## 2017-06-11 ENCOUNTER — Other Ambulatory Visit: Payer: Self-pay | Admitting: Family Medicine

## 2017-06-13 ENCOUNTER — Ambulatory Visit (INDEPENDENT_AMBULATORY_CARE_PROVIDER_SITE_OTHER): Payer: BC Managed Care – PPO | Admitting: Ophthalmology

## 2017-07-11 ENCOUNTER — Other Ambulatory Visit: Payer: Self-pay

## 2017-07-11 ENCOUNTER — Encounter: Payer: Self-pay | Admitting: Family Medicine

## 2017-07-11 ENCOUNTER — Ambulatory Visit: Payer: BC Managed Care – PPO | Admitting: Family Medicine

## 2017-07-11 DIAGNOSIS — G8929 Other chronic pain: Secondary | ICD-10-CM | POA: Diagnosis not present

## 2017-07-11 DIAGNOSIS — M5440 Lumbago with sciatica, unspecified side: Secondary | ICD-10-CM | POA: Diagnosis not present

## 2017-07-11 MED ORDER — DICLOFENAC SODIUM 75 MG PO TBEC: 75.0000 mg | DELAYED_RELEASE_TABLET | Freq: Two times a day (BID) | ORAL | 1 refills | Status: DC

## 2017-07-11 NOTE — Progress Notes (Signed)
Patient ID: Johnny Andrews, male    DOB: 1960/11/12  Age: 56 y.o. MRN: [id number]  Chief Complaint  Patient presents with  . Medication Refill    voltaren 75mg      Subjective:   Patient is been using diclofenac for years for his chronic back pain. It seems to give him fairly satisfactory relief.  Current allergies, medications, problem list, past/family and social histories reviewed.  Objective:  There were no vitals taken for this visit.  Obese gentleman, no major acute distress. Did not do a major examination on today. Reviewed his old chart and kidney functions have been good.  Assessment & Plan:   Assessment: 1. Chronic midline low back pain with sciatica, sciatica laterality unspecified       Plan: See instructions. I discussed with him the risks of diclofenac and diabetes, and urge minimizing use it when possible. However he does need relief of his pain and this is probably preferable to narcotics.  No orders of the defined types were placed in this encounter.   No orders of the defined types were placed in this encounter.        Patient Instructions   Use the diclofenac as previously directed. However I would recommend minimizing use whenever possible, trying to get by with one or maybe none on weekends. Nonsteroidal anti-inflammatory drugs can increase the risk of diabetic kidney disease.  In place of the diclofenac, when possible, try using Tylenol (acetaminophen) 500 mg 2 pills maximum of 3 times daily. Do not exceed 3000 mg in 24 hours.  With your leg aches, consider a two-week drug holiday from your statin some time to see whether it is causing some of the leg aching. In the event that you feel like it is a cause of the pain, you would need to discuss options with your doctor.    IF you received an x-ray today, you will receive an invoice from [facility]. Please contact [facility] at [phone] with questions or concerns regarding  your invoice.   IF you received labwork today, you will receive an invoice from [facility]. Please contact [facility] at [phone] with questions or concerns regarding your invoice.   Our billing staff will not be able to assist you with questions regarding bills from these companies.  You will be contacted with the lab results as soon as they are available. The fastest way to get your results is to activate your My Chart account. Instructions are located on the last page of this paperwork. If you have not heard from us regarding the results in 2 weeks, please contact this office.         No Follow-up on file.   Giang Hemme, MD 07/11/2017

## 2017-07-11 NOTE — Patient Instructions (Addendum)
Use the diclofenac as previously directed. However I would recommend minimizing use whenever possible, trying to get by with one or maybe none on weekends. Nonsteroidal anti-inflammatory drugs can increase the risk of diabetic kidney disease.  In place of the diclofenac, when possible, try using Tylenol (acetaminophen) 500 mg 2 pills maximum of 3 times daily. Do not exceed 3000 mg in 24 hours.  With your leg aches, consider a two-week drug holiday from your statin some time to see whether it is causing some of the leg aching. In the event that you feel like it is a cause of the pain, you would need to discuss options with your doctor.    IF you received an x-ray today, you will receive an invoice from [facility]. Please contact [facility] at [phone] with questions or concerns regarding your invoice.   IF you received labwork today, you will receive an invoice from [facility]. Please contact [facility] at [phone] with questions or concerns regarding your invoice.   Our billing staff will not be able to assist you with questions regarding bills from these companies.  You will be contacted with the lab results as soon as they are available. The fastest way to get your results is to activate your My Chart account. Instructions are located on the last page of this paperwork. If you have not heard from us regarding the results in 2 weeks, please contact this office.

## 2017-07-14 ENCOUNTER — Other Ambulatory Visit: Payer: Self-pay | Admitting: Family Medicine

## 2017-07-14 DIAGNOSIS — G6289 Other specified polyneuropathies: Secondary | ICD-10-CM

## 2017-08-17 ENCOUNTER — Other Ambulatory Visit: Payer: Self-pay | Admitting: Family Medicine

## 2017-08-17 DIAGNOSIS — G6289 Other specified polyneuropathies: Secondary | ICD-10-CM

## 2017-09-29 ENCOUNTER — Other Ambulatory Visit: Payer: Self-pay | Admitting: Family Medicine

## 2017-09-29 DIAGNOSIS — G6289 Other specified polyneuropathies: Secondary | ICD-10-CM

## 2017-09-30 NOTE — Telephone Encounter (Signed)
Topamax refill Last OV: 02/22/17 Last Refill:08/18/17 Pharmacy:[facility] [address]

## 2017-11-04 ENCOUNTER — Other Ambulatory Visit: Payer: Self-pay | Admitting: Cardiovascular Disease

## 2017-11-04 DIAGNOSIS — I739 Peripheral vascular disease, unspecified: Secondary | ICD-10-CM

## 2017-11-04 DIAGNOSIS — I10 Essential (primary) hypertension: Secondary | ICD-10-CM

## 2017-11-04 NOTE — Telephone Encounter (Signed)
REFILL 

## 2017-11-14 ENCOUNTER — Other Ambulatory Visit: Payer: Self-pay | Admitting: Family Medicine

## 2017-11-14 DIAGNOSIS — G6289 Other specified polyneuropathies: Secondary | ICD-10-CM

## 2017-11-15 NOTE — Telephone Encounter (Signed)
Last OV: 02/2017; Has upcoming appointment 11/16/17 PCP: Katrinka BlazingSmith

## 2017-11-16 ENCOUNTER — Ambulatory Visit: Payer: BC Managed Care – PPO | Admitting: Family Medicine

## 2017-11-16 ENCOUNTER — Encounter: Payer: Self-pay | Admitting: Family Medicine

## 2017-11-16 ENCOUNTER — Other Ambulatory Visit: Payer: Self-pay

## 2017-11-16 VITALS — BP 144/82 | HR 96 | Temp 98.7°F | Ht 75.0 in | Wt >= 6400 oz

## 2017-11-16 DIAGNOSIS — M5441 Lumbago with sciatica, right side: Secondary | ICD-10-CM

## 2017-11-16 DIAGNOSIS — I10 Essential (primary) hypertension: Secondary | ICD-10-CM | POA: Diagnosis not present

## 2017-11-16 DIAGNOSIS — M25562 Pain in left knee: Secondary | ICD-10-CM | POA: Diagnosis not present

## 2017-11-16 DIAGNOSIS — M25561 Pain in right knee: Secondary | ICD-10-CM | POA: Diagnosis not present

## 2017-11-16 DIAGNOSIS — R269 Unspecified abnormalities of gait and mobility: Secondary | ICD-10-CM | POA: Diagnosis not present

## 2017-11-16 DIAGNOSIS — R972 Elevated prostate specific antigen [PSA]: Secondary | ICD-10-CM | POA: Diagnosis not present

## 2017-11-16 DIAGNOSIS — E1142 Type 2 diabetes mellitus with diabetic polyneuropathy: Secondary | ICD-10-CM

## 2017-11-16 DIAGNOSIS — G8929 Other chronic pain: Secondary | ICD-10-CM

## 2017-11-16 DIAGNOSIS — M791 Myalgia, unspecified site: Secondary | ICD-10-CM | POA: Diagnosis not present

## 2017-11-16 DIAGNOSIS — E7800 Pure hypercholesterolemia, unspecified: Secondary | ICD-10-CM

## 2017-11-16 DIAGNOSIS — M5442 Lumbago with sciatica, left side: Secondary | ICD-10-CM | POA: Diagnosis not present

## 2017-11-16 DIAGNOSIS — G6289 Other specified polyneuropathies: Secondary | ICD-10-CM | POA: Diagnosis not present

## 2017-11-16 DIAGNOSIS — E039 Hypothyroidism, unspecified: Secondary | ICD-10-CM

## 2017-11-16 LAB — POCT GLYCOSYLATED HEMOGLOBIN (HGB A1C): Hemoglobin A1C: 8.6

## 2017-11-16 MED ORDER — LISINOPRIL 10 MG PO TABS: 10.0000 mg | ORAL_TABLET | Freq: Every day | ORAL | 1 refills | Status: DC

## 2017-11-16 MED ORDER — ALPRAZOLAM 0.5 MG PO TABS: 0.5000 mg | ORAL_TABLET | Freq: Every evening | ORAL | 2 refills | Status: DC | PRN

## 2017-11-16 MED ORDER — TOPIRAMATE 50 MG PO TABS: 100.0000 mg | ORAL_TABLET | Freq: Every day | ORAL | 1 refills | Status: DC

## 2017-11-16 NOTE — Patient Instructions (Signed)
     IF you received an x-ray today, you will receive an invoice from [facility]. Please contact [facility] at [phone] with questions or concerns regarding your invoice.   IF you received labwork today, you will receive an invoice from [facility]. Please contact [facility] at [phone] with questions or concerns regarding your invoice.   Our billing staff will not be able to assist you with questions regarding bills from these companies.  You will be contacted with the lab results as soon as they are available. The fastest way to get your results is to activate your My Chart account. Instructions are located on the last page of this paperwork. If you have not heard from us regarding the results in 2 weeks, please contact this office.     

## 2017-11-16 NOTE — Progress Notes (Signed)
Subjective:    Patient ID: Johnny Andrews, male    DOB: 10/10/1960, 57 y.o.   MRN: [id number]  11/16/2017  Knee Pain and Referral (requesting referral for gastric bypass surgery)    HPI This 57 y.o. male presents for worsening knee pain and referral to gastric bypass.  Cannot go very far due to B knees; also for the past year, the B muscle pain.   Extreme musclee pain; knees are taking wear and tear.   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  Has decreased food intake because unable to exercise. No balance problems but wanting support and fear of tweaking knee.   No current back pain; s/p MRI lumbar spine in the past. History of sciatica; not currnt symptoms. Discussing gastric bypass with endocrinology; has three months off for recovery.  No raises or praises but gets your time off.  Great time off benefits.   No hip pain. All knees and thighs. Pain is around knees.  No pain below knees.   No foot pain; has neuropathy.  No worsening. Has been to two bariatric seminars.   BP Readings from Last 3 Encounters:  12/30/17 (!) 142/86  11/16/17 (!) 144/82  02/22/17 (!) 144/85   Wt Readings from Last 3 Encounters:  12/30/17 (!) 406 lb (184.2 kg)  11/16/17 (!) 406 lb 12.8 oz (184.5 kg)  02/22/17 (!) 398 lb (180.5 kg)   Immunization History  Administered Date(s) Administered  . Influenza Split 06/23/2012  . Influenza,inj,Quad PF,6+ Mos 05/31/2016  . Pneumococcal Polysaccharide-23 12/13/2011  . Td 10/24/2008    Review of Systems  Constitutional: Negative for activity change, appetite change, chills, diaphoresis, fatigue and fever.  Respiratory: Negative for cough and shortness of breath.   Cardiovascular: Negative for chest pain, palpitations and leg swelling.  Gastrointestinal: Negative for abdominal pain, diarrhea, nausea and vomiting.  Endocrine: Negative for cold intolerance, heat intolerance, polydipsia, polyphagia and  polyuria.  Musculoskeletal: Positive for arthralgias, gait problem and myalgias.  Skin: Negative for color change, rash and wound.  Neurological: Positive for weakness. Negative for dizziness, tremors, seizures, syncope, facial asymmetry, speech difficulty, light-headedness, numbness and headaches.  Psychiatric/Behavioral: Negative for dysphoric mood and sleep disturbance. The patient is not nervous/anxious.     Past Medical History:  Diagnosis Date  . ALCOHOL USE 10/24/2008  . ANXIETY 09/19/2007  . Cough 05/22/2008  . DIABETES MELLITUS, TYPE II 09/19/2007  . External thrombosed hemorrhoids 08/23/2008  . GOUT 04/17/2010  . HYPERLIPIDEMIA 09/19/2007  . HYPERTENSION 09/19/2007  . HYPOTHYROIDISM 09/19/2007  . Leg pain   . OSTEOARTHRITIS 09/19/2007  . OTHER DISEASES OF LUNG NOT ELSEWHERE CLASSIFIED 07/20/2010  . Rash and other nonspecific skin eruption 09/19/2007  . Restrictive lung disease   . SCROTAL ABSCESS 05/27/2010  . Sebaceous cyst 08/01/2008  . SLEEP APNEA 08/22/2009  . SMOKER 04/17/2010   Past Surgical History:  Procedure Laterality Date  . COLONOSCOPY    . NASAL SEPTUM SURGERY     No Known Allergies Current Outpatient Medications on File Prior to Visit  Medication Sig Dispense Refill  . BD ULTRA-FINE PEN NEEDLES 29G X 12.7MM MISC USE TWO CHECK BLOOD SUGAR TWICE A DAY. APPOINTMENT NEEDED FOR FURTHER REFILLS 100 each 0  . dapagliflozin propanediol (FARXIGA) 10 MG TABS tablet Take 20 mg by mouth daily.    Marland Kitchen diltiazem (TIAZAC) 180 MG 24 hr capsule Take 180 mg by mouth daily.  6  . glucose blood (ONE  TOUCH ULTRA TEST) test strip Two times a day for ULTRA 2 ONE TOUCH 100 each 5  . insulin aspart (NOVOLOG FLEXPEN) 100 UNIT/ML FlexPen Inject 75 Units into the skin 3 (three) times daily with meals.     Marland Kitchen levothyroxine (SYNTHROID, LEVOTHROID) 300 MCG tablet Take 300 mcg by mouth daily.  11  . metFORMIN (GLUCOPHAGE-XR) 500 MG 24 hr tablet Take 1,000 mg by mouth 2 (two) times daily.     Marland Kitchen Respiratory  Therapy Supplies (ADULT MASK) MISC 2 Devices by Does not apply route once. 2 each 0  . simvastatin (ZOCOR) 20 MG tablet Take 20 mg by mouth daily.    . TRESIBA FLEXTOUCH 200 UNIT/ML SOPN INJECT 100 UNITS TWICE A DAY  6   No current facility-administered medications on file prior to visit.    Social History   Socioeconomic History  . Marital status: Married    Spouse name: Not on file  . Number of children: 2  . Years of education: BS  . Highest education level: Not on file  Occupational History  . Occupation: Software engineer: [employer]  Social Needs  . Financial resource strain: Not on file  . Food insecurity:    Worry: Not on file    Inability: Not on file  . Transportation needs:    Medical: Not on file    Non-medical: Not on file  Tobacco Use  . Smoking status: Former Smoker    Packs/day: 0.50    Years: 30.00    Pack years: 15.00    Last attempt to quit: 11/05/2012    Years since quitting: 5.1  . Smokeless tobacco: Never Used  . Tobacco comment: 30 years at most off and on smoking 03/14/15  Substance and Sexual Activity  . Alcohol use: Yes    Alcohol/week: 0.0 oz    Comment: 2 drinks per day  . Drug use: Yes    Comment: Occasional Marijuana use  . Sexual activity: Not on file  Lifestyle  . Physical activity:    Days per week: Not on file    Minutes per session: Not on file  . Stress: Not on file  Relationships  . Social connections:    Talks on phone: Not on file    Gets together: Not on file    Attends religious service: Not on file    Active member of club or organization: Not on file    Attends meetings of clubs or organizations: Not on file    Relationship status: Not on file  . Intimate partner violence:    Fear of current or ex partner: Not on file    Emotionally abused: Not on file    Physically abused: Not on file    Forced sexual activity: Not on file  Other Topics Concern  . Not on file  Social History Narrative   Originally from [place]. Has lived in  [place], [place], [place], & [place] since 1993. 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 Currently has a dog & cats. Previously had a parrot (conure) 12 years ago for 1 year but in same house.  Has also had excessive exposure to pigeon feces. He has had very brief exposure to asbestos around 2000. No hot tub exposure. He has inhaled chemical fumes/gases/refridgerants through his work.      Marital status: married x 26 years      Children: 2 children (19, 48);  no grandchildren      Lives: Lives at home with his wife      Employment: [employer]      Tobacco: none; quit in 2013; smoked x 1/2 ppd x 20 years      Alcohol:  10 drinks; usually 2 drinks per day and then skip two days.      Drugs:  None      Exercise: none; job is physically demanding      Right-handed.   1 cup caffeine daily.   Family History  Problem Relation Age of Onset  . Breast cancer Mother        Breast Cancer  . Parkinson's disease Mother   . Heart disease Father        CABG, stenting cardiac  . Cancer Brother [id number]       prostate cancer  . Lung disease Neg Hx   . Autoimmune disease Neg Hx        Objective:    BP (!) 144/82 (BP Location: Left Arm, Patient Position: Sitting, Cuff Size: Large)   Pulse 96   Temp 98.7 F (37.1 C) (Oral)   Ht 6\' 3"  (1.905 m)   Wt (!) 406 lb 12.8 oz (184.5 kg)   SpO2 100%   BMI 50.85 kg/m   Physical Exam  Constitutional: He is oriented to person, place, and time. He appears well-developed and well-nourished. No distress.  HENT:  Head: Normocephalic and atraumatic.  Right Ear: External ear normal.  Left Ear: External ear normal.  Nose: Nose normal.  Mouth/Throat: Oropharynx is clear and moist.  Eyes: Pupils are equal, round, and reactive to light. Conjunctivae and EOM are normal.  Neck: Normal range of motion. Neck supple. Carotid bruit is not present. No thyromegaly present.  Cardiovascular: Normal rate, regular rhythm,  normal heart sounds and intact distal pulses. Exam reveals no gallop and no friction rub.  No murmur heard. Pulmonary/Chest: Effort normal and breath sounds normal. He has no wheezes. He has no rales.  Abdominal: Soft. Bowel sounds are normal. He exhibits no distension and no mass. There is no tenderness. There is no rebound and no guarding.  Musculoskeletal: He exhibits no edema.       Right knee: He exhibits decreased range of motion. He exhibits no swelling, no effusion and no bony tenderness. No tenderness found. No medial joint line and no lateral joint line tenderness noted.       Left knee: He exhibits decreased range of motion. He exhibits no swelling and no effusion. No tenderness found. No medial joint line and no lateral joint line tenderness noted.       Lumbar back: He exhibits normal range of motion, no tenderness, no bony tenderness, no pain, no spasm and normal pulse.       Right upper leg: He exhibits no tenderness, no bony tenderness, no swelling, no edema and no deformity.       Left upper leg: He exhibits no tenderness, no bony tenderness, no swelling, no edema, no deformity and no laceration.  Lymphadenopathy:    He has no cervical adenopathy.  Neurological: He is alert and oriented to person, place, and time. No cranial nerve deficit.  Skin: Skin is warm and dry. No rash noted. He is not diaphoretic.  Psychiatric: He has a normal mood and affect. His behavior is normal.  Nursing note and vitals reviewed.  No results found. Depression screen [facility] 2/9 12/30/2017 11/16/2017 07/11/2017 02/22/2017 01/05/2017  Decreased Interest  0 0 0 0 0  Down, Depressed, Hopeless 0 0 0 0 0  PHQ - 2 Score 0 0 0 0 0   Fall Risk  11/16/2017 07/11/2017 02/22/2017 01/05/2017 12/02/2016  Falls in the past year? No No No No No        Assessment & Plan:   1. Acute pain of both knees   2. Chronic bilateral low back pain with bilateral sciatica   3. Morbid obesity (HCC)   4. Abnormality of gait   5.  Essential hypertension   6. Type 2 diabetes mellitus with diabetic polyneuropathy, unspecified whether long term insulin use (HCC)   7. Pure hypercholesterolemia   8. Acquired hypothyroidism   9. Elevated PSA   10. Muscle pain   11. Other polyneuropathy     Acute bilateral knee pain associated with chronic bilateral lower back pain is likely demanding job: Gradually worsening with age and with progression of morbid obesity.  Refer to orthopedics for consultation to determine if current symptomatology is due to rib radicular symptoms from lumbar spine.  Not consistent with claudication.  Patient struggling to maintain work demands due to severity of pain.  Morbid obesity: Negatively impacting patient's quality of life and ability to carry out work demands due to musculoskeletal pains.  I have discussed the options of bariatric surgery with patient on multiple visits in the past year.  I think he is an excellent candidate for gastric bypass.  He has multiple comorbidities associated with his morbid obesity including hypertension, diabetes mellitus type 2, hypercholesterolemia, degenerative disc disease of lumbar spine.  Referral to general surgery made for bariatric consultation.  Hypercholesterolemia: Controlled.  Obtain labs for chronic disease management.  Continue current medications.  Hypertension: Moderately controlled.  Obtain labs for chronic disease management.  Continue current medications.  Diabetes mellitus type 2 with diabetic polyneuropathy with long-term insulin use: Managed by endocrinology. Hemoglobin A1c obtained due to upcoming endocrinology appointment.  Anxiety state: Patient admits to anxiety due to obesity musculoskeletal pains and multiple comorbidities.  Agreeable to prescription of Xanax to use sparingly as needed for anxiety.  Orders Placed This Encounter  Procedures  . CBC with Differential/Platelet  . Comprehensive metabolic panel    Order Specific Question:   Has  the patient fasted?    Answer:   No  . Lipid panel    Order Specific Question:   Has the patient fasted?    Answer:   No  . TSH  . T4, free  . PSA  . Microalbumin / creatinine urine ratio  . CK  . Ambulatory referral to Orthopedic Surgery    Referral Priority:   Routine    Referral Type:   Surgical    Referral Reason:   Specialty Services Required    Requested Specialty:   Orthopedic Surgery    Number of Visits Requested:   1  . Ambulatory referral to General Surgery    Referral Priority:   Routine    Referral Type:   Surgical    Referral Reason:   Specialty Services Required    Requested Specialty:   General Surgery    Number of Visits Requested:   1  . POCT glycosylated hemoglobin (Hb A1C)   Meds ordered this encounter  Medications  . ALPRAZolam (XANAX) 0.5 MG tablet    Sig: Take 1 tablet (0.5 mg total) by mouth at bedtime as needed for anxiety.    Dispense:  30 tablet    Refill:  2  .  lisinopril (PRINIVIL,ZESTRIL) 10 MG tablet    Sig: Take 1 tablet (10 mg total) by mouth daily.    Dispense:  90 tablet    Refill:  1  . topiramate (TOPAMAX) 50 MG tablet    Sig: Take 2 tablets (100 mg total) by mouth at bedtime.    Dispense:  180 tablet    Refill:  1    Return in about 6 months (around 05/18/2018) for complete physical examiniation.   [place] Paulita Fujita, M.D. Primary Care at [facility] [address] [place], [place]  [address] [phone] phone [phone] fax

## 2017-11-17 LAB — CBC WITH DIFFERENTIAL/PLATELET
BASOS ABS: 0.1 10*3/uL (ref 0.0–0.2)
Basos: 1 %
EOS (ABSOLUTE): 0.4 10*3/uL (ref 0.0–0.4)
Eos: 4 %
Hematocrit: 43.4 % (ref 37.5–51.0)
Hemoglobin: 15.5 g/dL (ref 13.0–17.7)
IMMATURE GRANS (ABS): 0 10*3/uL (ref 0.0–0.1)
Immature Granulocytes: 0 %
LYMPHS: 36 %
Lymphocytes Absolute: 3.4 10*3/uL — ABNORMAL HIGH (ref 0.7–3.1)
MCH: 30.3 pg (ref 26.6–33.0)
MCHC: 35.7 g/dL (ref 31.5–35.7)
MCV: 85 fL (ref 79–97)
Monocytes Absolute: 0.9 10*3/uL (ref 0.1–0.9)
Monocytes: 10 %
NEUTROS ABS: 4.9 10*3/uL (ref 1.4–7.0)
Neutrophils: 49 %
PLATELETS: 227 10*3/uL (ref 150–379)
RBC: 5.12 x10E6/uL (ref 4.14–5.80)
RDW: 14.4 % (ref 12.3–15.4)
WBC: 9.6 10*3/uL (ref 3.4–10.8)

## 2017-11-17 LAB — MICROALBUMIN / CREATININE URINE RATIO
CREATININE, UR: 160.6 mg/dL
Microalb/Creat Ratio: 338.2 mg/g creat — ABNORMAL HIGH (ref 0.0–30.0)
Microalbumin, Urine: 543.1 ug/mL

## 2017-11-17 LAB — LIPID PANEL
CHOLESTEROL TOTAL: 157 mg/dL (ref 100–199)
Chol/HDL Ratio: 3.9 ratio (ref 0.0–5.0)
HDL: 40 mg/dL (ref >39)
LDL Calculated: 70 mg/dL (ref 0–99)
TRIGLYCERIDES: 236 mg/dL — AB (ref 0–149)
VLDL CHOLESTEROL CAL: 47 mg/dL — AB (ref 5–40)

## 2017-11-17 LAB — COMPREHENSIVE METABOLIC PANEL
ALK PHOS: 78 IU/L (ref 39–117)
ALT: 30 IU/L (ref 0–44)
AST: 25 IU/L (ref 0–40)
Albumin/Globulin Ratio: 1.8 (ref 1.2–2.2)
Albumin: 4.2 g/dL (ref 3.5–5.5)
BILIRUBIN TOTAL: 0.4 mg/dL (ref 0.0–1.2)
BUN/Creatinine Ratio: 22 — ABNORMAL HIGH (ref 9–20)
BUN: 17 mg/dL (ref 6–24)
CHLORIDE: 103 mmol/L (ref 96–106)
CO2: 24 mmol/L (ref 20–29)
Calcium: 9.4 mg/dL (ref 8.7–10.2)
Creatinine, Ser: 0.76 mg/dL (ref 0.76–1.27)
GFR calc Af Amer: 118 mL/min/{1.73_m2} (ref >59)
GFR calc non Af Amer: 102 mL/min/{1.73_m2} (ref >59)
GLUCOSE: 82 mg/dL (ref 65–99)
Globulin, Total: 2.4 g/dL (ref 1.5–4.5)
Potassium: 4.4 mmol/L (ref 3.5–5.2)
Sodium: 142 mmol/L (ref 134–144)
TOTAL PROTEIN: 6.6 g/dL (ref 6.0–8.5)

## 2017-11-17 LAB — PSA: PROSTATE SPECIFIC AG, SERUM: 5 ng/mL — AB (ref 0.0–4.0)

## 2017-11-17 LAB — TSH: TSH: 2.89 u[IU]/mL (ref 0.450–4.500)

## 2017-11-17 LAB — T4, FREE: Free T4: 1.37 ng/dL (ref 0.82–1.77)

## 2017-11-17 LAB — CK: Total CK: 138 U/L (ref 24–204)

## 2017-12-01 ENCOUNTER — Telehealth: Payer: Self-pay | Admitting: Family Medicine

## 2017-12-01 NOTE — Telephone Encounter (Signed)
Resent referral to [facility] they stated they didn't received referral I have confirmation that it was sent. Called [facility] and left a detailed vm for referral coordinator to call back to see if they have received pt referral also have confirmation that referral was sent to them as well.

## 2017-12-16 DIAGNOSIS — M1712 Unilateral primary osteoarthritis, left knee: Secondary | ICD-10-CM | POA: Insufficient documentation

## 2017-12-16 DIAGNOSIS — M1711 Unilateral primary osteoarthritis, right knee: Secondary | ICD-10-CM | POA: Insufficient documentation

## 2017-12-20 ENCOUNTER — Other Ambulatory Visit: Payer: Self-pay | Admitting: Family Medicine

## 2017-12-20 DIAGNOSIS — M5440 Lumbago with sciatica, unspecified side: Principal | ICD-10-CM

## 2017-12-20 DIAGNOSIS — G8929 Other chronic pain: Secondary | ICD-10-CM

## 2017-12-30 ENCOUNTER — Ambulatory Visit: Payer: BC Managed Care – PPO | Admitting: Urgent Care

## 2017-12-30 ENCOUNTER — Ambulatory Visit (INDEPENDENT_AMBULATORY_CARE_PROVIDER_SITE_OTHER): Payer: BC Managed Care – PPO

## 2017-12-30 ENCOUNTER — Encounter: Payer: Self-pay | Admitting: Urgent Care

## 2017-12-30 VITALS — BP 142/86 | HR 88 | Temp 98.3°F | Resp 18 | Ht 75.0 in | Wt >= 6400 oz

## 2017-12-30 DIAGNOSIS — R05 Cough: Secondary | ICD-10-CM

## 2017-12-30 DIAGNOSIS — R0981 Nasal congestion: Secondary | ICD-10-CM

## 2017-12-30 DIAGNOSIS — I10 Essential (primary) hypertension: Secondary | ICD-10-CM | POA: Diagnosis not present

## 2017-12-30 DIAGNOSIS — R5381 Other malaise: Secondary | ICD-10-CM

## 2017-12-30 DIAGNOSIS — R058 Other specified cough: Secondary | ICD-10-CM

## 2017-12-30 DIAGNOSIS — R0789 Other chest pain: Secondary | ICD-10-CM | POA: Diagnosis not present

## 2017-12-30 DIAGNOSIS — E1165 Type 2 diabetes mellitus with hyperglycemia: Secondary | ICD-10-CM

## 2017-12-30 DIAGNOSIS — R030 Elevated blood-pressure reading, without diagnosis of hypertension: Secondary | ICD-10-CM | POA: Diagnosis not present

## 2017-12-30 MED ORDER — HYDROCODONE-HOMATROPINE 5-1.5 MG/5ML PO SYRP: 5.0000 mL | ORAL_SOLUTION | Freq: Every evening | ORAL | 0 refills | Status: DC | PRN

## 2017-12-30 MED ORDER — BENZONATATE 100 MG PO CAPS: 100.0000 mg | ORAL_CAPSULE | Freq: Three times a day (TID) | ORAL | 0 refills | Status: DC | PRN

## 2017-12-30 MED ORDER — PREDNISONE 20 MG PO TABS: 20.0000 mg | ORAL_TABLET | Freq: Every day | ORAL | 0 refills | Status: DC

## 2017-12-30 NOTE — Patient Instructions (Addendum)
We will use 20 mg of prednisone to help you with your sinus congestion and severe cough that is eliciting your chest pain.  Please use the cough syrup at night only as it can cause drowsiness, sedation, wooziness.  You may use Tessalon capsules during the day for your cough.  Please make sure you checkup with your PCP regarding your blood pressure and blood sugar readings.  If you see that your blood sugar is rising above 300 then you need to stop taking prednisone.   Hypertension Hypertension, commonly called high blood pressure, is when the force of blood pumping through the arteries is too strong. The arteries are the blood vessels that carry blood from the heart throughout the body. Hypertension forces the heart to work harder to pump blood and may cause arteries to become narrow or stiff. Having untreated or uncontrolled hypertension can cause heart attacks, strokes, kidney disease, and other problems. A blood pressure reading consists of a higher number over a lower number. Ideally, your blood pressure should be below 120/80. The first ("top") number is called the systolic pressure. It is a measure of the pressure in your arteries as your heart beats. The second ("bottom") number is called the diastolic pressure. It is a measure of the pressure in your arteries as the heart relaxes. What are the causes? The cause of this condition is not known. What increases the risk? Some risk factors for high blood pressure are under your control. Others are not. Factors you can change  Smoking.  Having type 2 diabetes mellitus, high cholesterol, or both.  Not getting enough exercise or physical activity.  Being overweight.  Having too much fat, sugar, calories, or salt (sodium) in your diet.  Drinking too much alcohol. Factors that are difficult or impossible to change  Having chronic kidney disease.  Having a family history of high blood pressure.  Age. Risk increases with age.  Race. You  may be at higher risk if you are African-American.  Gender. Men are at higher risk than women before age 73. After age 32, women are at higher risk than men.  Having obstructive sleep apnea.  Stress. What are the signs or symptoms? Extremely high blood pressure (hypertensive crisis) may cause:  Headache.  Anxiety.  Shortness of breath.  Nosebleed.  Nausea and vomiting.  Severe chest pain.  Jerky movements you cannot control (seizures).  How is this diagnosed? This condition is diagnosed by measuring your blood pressure while you are seated, with your arm resting on a surface. The cuff of the blood pressure monitor will be placed directly against the skin of your upper arm at the level of your heart. It should be measured at least twice using the same arm. Certain conditions can cause a difference in blood pressure between your right and left arms. Certain factors can cause blood pressure readings to be lower or higher than normal (elevated) for a short period of time:  When your blood pressure is higher when you are in a health care provider's office than when you are at home, this is called white coat hypertension. Most people with this condition do not need medicines.  When your blood pressure is higher at home than when you are in a health care provider's office, this is called masked hypertension. Most people with this condition may need medicines to control blood pressure.  If you have a high blood pressure reading during one visit or you have normal blood pressure with other risk factors:  You may be asked to return on a different day to have your blood pressure checked again.  You may be asked to monitor your blood pressure at home for 1 week or longer.  If you are diagnosed with hypertension, you may have other blood or imaging tests to help your health care provider understand your overall risk for other conditions. How is this treated? This condition is treated by  making healthy lifestyle changes, such as eating healthy foods, exercising more, and reducing your alcohol intake. Your health care provider may prescribe medicine if lifestyle changes are not enough to get your blood pressure under control, and if:  Your systolic blood pressure is above 130.  Your diastolic blood pressure is above 80.  Your personal target blood pressure may vary depending on your medical conditions, your age, and other factors. Follow these instructions at home: Eating and drinking  Eat a diet that is high in fiber and potassium, and low in sodium, added sugar, and fat. An example eating plan is called the DASH (Dietary Approaches to Stop Hypertension) diet. To eat this way: ? Eat plenty of fresh fruits and vegetables. Try to fill half of your plate at each meal with fruits and vegetables. ? Eat whole grains, such as whole wheat pasta, brown rice, or whole grain bread. Fill about one quarter of your plate with whole grains. ? Eat or drink low-fat dairy products, such as skim milk or low-fat yogurt. ? Avoid fatty cuts of meat, processed or cured meats, and poultry with skin. Fill about one quarter of your plate with lean proteins, such as fish, chicken without skin, beans, eggs, and tofu. ? Avoid premade and processed foods. These tend to be higher in sodium, added sugar, and fat.  Reduce your daily sodium intake. Most people with hypertension should eat less than 1,500 mg of sodium a day.  Limit alcohol intake to no more than 1 drink a day for nonpregnant women and 2 drinks a day for men. One drink equals 12 oz of beer, 5 oz of wine, or 1 oz of hard liquor. Lifestyle  Work with your health care provider to maintain a healthy body weight or to lose weight. Ask what an ideal weight is for you.  Get at least 30 minutes of exercise that causes your heart to beat faster (aerobic exercise) most days of the week. Activities may include walking, swimming, or biking.  Include  exercise to strengthen your muscles (resistance exercise), such as pilates or lifting weights, as part of your weekly exercise routine. Try to do these types of exercises for 30 minutes at least 3 days a week.  Do not use any products that contain nicotine or tobacco, such as cigarettes and e-cigarettes. If you need help quitting, ask your health care provider.  Monitor your blood pressure at home as told by your health care provider.  Keep all follow-up visits as told by your health care provider. This is important. Medicines  Take over-the-counter and prescription medicines only as told by your health care provider. Follow directions carefully. Blood pressure medicines must be taken as prescribed.  Do not skip doses of blood pressure medicine. Doing this puts you at risk for problems and can make the medicine less effective.  Ask your health care provider about side effects or reactions to medicines that you should watch for. Contact a health care provider if:  You think you are having a reaction to a medicine you are taking.  You have  headaches that keep coming back (recurring).  You feel dizzy.  You have swelling in your ankles.  You have trouble with your vision. Get help right away if:  You develop a severe headache or confusion.  You have unusual weakness or numbness.  You feel faint.  You have severe pain in your chest or abdomen.  You vomit repeatedly.  You have trouble breathing. Summary  Hypertension is when the force of blood pumping through your arteries is too strong. If this condition is not controlled, it may put you at risk for serious complications.  Your personal target blood pressure may vary depending on your medical conditions, your age, and other factors. For most people, a normal blood pressure is less than 120/80.  Hypertension is treated with lifestyle changes, medicines, or a combination of both. Lifestyle changes include weight loss, eating a  healthy, low-sodium diet, exercising more, and limiting alcohol. This information is not intended to replace advice given to you by your health care provider. Make sure you discuss any questions you have with your health care provider. Document Released: 08/02/2005 Document Revised: 06/30/2016 Document Reviewed: 06/30/2016 Elsevier Interactive Patient Education  2018 [employer].     IF you received an x-ray today, you will receive an invoice from [facility]. Please contact [facility] at [phone] with questions or concerns regarding your invoice.   IF you received labwork today, you will receive an invoice from [facility]. Please contact [facility] at [phone] with questions or concerns regarding your invoice.   Our billing staff will not be able to assist you with questions regarding bills from these companies.  You will be contacted with the lab results as soon as they are available. The fastest way to get your results is to activate your My Chart account. Instructions are located on the last page of this paperwork. If you have not heard from [place] regarding the results in 2 weeks, please contact this office.

## 2017-12-30 NOTE — Progress Notes (Signed)
   MRN: [id number] DOB: 09/04/1960  Subjective:   Johnny Andrews is a 57 y.o. male presenting for 6 day history of worsening productive cough that is eliciting chest wall pain. Also reports malaise. Felt like his symptoms started off very mild but has since progressed despite using otc cough medications. He is also having difficulty with nasal congestion, wearing his CPAP at bedtime. Denies shob, wheezing, hemoptysis, n/v, abdominal pain. Reports that he feels he has recurrent bronchitis. States that he has had this over the past 10 years. Admits ~10 pack year smoking history, quit ~10 years ago. Regarding his HTN, patient is supposed to be taking  lisinopril. He is working his way up to that dose. Of note, his last a1c was at 8.6%.  Johnny Andrews has a current medication list which includes the following prescription(s): alprazolam, bd ultra-fine pen needles, dapagliflozin propanediol, diclofenac, diltiazem, glucose blood, insulin aspart, levothyroxine, lisinopril, metformin, adult mask, simvastatin, topiramate, and tresiba flextouch. Also has No Known Allergies.  Johnny Andrews  has a past medical history of ALCOHOL USE (10/24/2008), ANXIETY (09/19/2007), Cough (05/22/2008), DIABETES MELLITUS, TYPE II (09/19/2007), External thrombosed hemorrhoids (08/23/2008), GOUT (04/17/2010), HYPERLIPIDEMIA (09/19/2007), HYPERTENSION (09/19/2007), HYPOTHYROIDISM (09/19/2007), Leg pain, OSTEOARTHRITIS (09/19/2007), OTHER DISEASES OF LUNG NOT ELSEWHERE CLASSIFIED (07/20/2010), Rash and other nonspecific skin eruption (09/19/2007), Restrictive lung disease, SCROTAL ABSCESS (05/27/2010), Sebaceous cyst (08/01/2008), SLEEP APNEA (08/22/2009), and SMOKER (04/17/2010). Also  has a past surgical history that includes Nasal septum surgery and Colonoscopy.  Objective:   Vitals: BP (!) 142/86   Pulse 88   Temp 98.3 F (36.8 C) (Oral)   Resp 18   Ht  (1.905 m)   Wt (!) 406 lb (184.2 kg)   SpO2 95%   BMI 50.75 kg/m   BP Readings from Last 3  Encounters:  12/30/17 (!) 142/86  11/16/17 (!) 144/82  02/22/17 (!) 144/85    Physical Exam  Constitutional: He is oriented to person, place, and time. He appears well-developed and well-nourished.  HENT:  Mucous membranes dry.   Cardiovascular: Normal rate, regular rhythm and intact distal pulses. Exam reveals no gallop and no friction rub.  No murmur heard. Pulmonary/Chest: No respiratory distress. He has no wheezes. He has no rales.  Neurological: He is alert and oriented to person, place, and time.  Skin: Skin is warm and dry.   Dg Chest 2 View  Result Date: 12/30/2017 CLINICAL DATA:  Productive cough EXAM: CHEST - 2 VIEW COMPARISON:  03/05/2015 FINDINGS: Cardiac and mediastinal contours normal. Azygos lobe fissure noted. Lungs are clear without infiltrate or effusion. Negative for heart failure. IMPRESSION: No active cardiopulmonary disease. Electronically Signed   By: Marlan Palau M.D.   On: 12/30/2017 17:54    Assessment and Plan :   Productive cough - Plan: DG Chest 2 View  Chest wall pain  Sinus congestion  Malaise  Morbid obesity (HCC)  Uncontrolled type 2 diabetes mellitus with hyperglycemia (HCC)  Essential hypertension  Elevated blood pressure reading  Will use short steroid course for his congestion and severe cough.  Offered cough suppression therapy.  She is to increase his lisinopril from 10 mg to 20 mg.  He will follow-up with his PCP for continued management of his blood sugar and hypertension. Counseled patient on potential for adverse effects with medications prescribed today, patient verbalized understanding. Return-to-clinic precautions discussed, patient verbalized understanding.   Wallis Bamberg, PA-C Primary Care at [facility] [phone] 12/30/2017  5:32 PM

## 2017-12-31 ENCOUNTER — Encounter: Payer: Self-pay | Admitting: Urgent Care

## 2018-01-09 ENCOUNTER — Encounter: Payer: Self-pay | Admitting: Family Medicine

## 2018-01-30 ENCOUNTER — Other Ambulatory Visit: Payer: Self-pay | Admitting: Cardiovascular Disease

## 2018-01-30 DIAGNOSIS — I10 Essential (primary) hypertension: Secondary | ICD-10-CM

## 2018-01-30 DIAGNOSIS — I739 Peripheral vascular disease, unspecified: Secondary | ICD-10-CM

## 2018-03-06 ENCOUNTER — Telehealth: Payer: Self-pay | Admitting: Family Medicine

## 2018-03-06 ENCOUNTER — Other Ambulatory Visit: Payer: Self-pay

## 2018-03-06 ENCOUNTER — Ambulatory Visit: Payer: BC Managed Care – PPO | Admitting: Family Medicine

## 2018-03-06 ENCOUNTER — Encounter: Payer: Self-pay | Admitting: Family Medicine

## 2018-03-06 VITALS — BP 128/76 | HR 85 | Temp 97.8°F | Ht 75.0 in | Wt 388.9 lb

## 2018-03-06 DIAGNOSIS — L03317 Cellulitis of buttock: Secondary | ICD-10-CM | POA: Diagnosis not present

## 2018-03-06 MED ORDER — DOXYCYCLINE HYCLATE 100 MG PO TABS: 100.0000 mg | ORAL_TABLET | Freq: Two times a day (BID) | ORAL | 0 refills | Status: DC

## 2018-03-06 NOTE — Patient Instructions (Addendum)
As we discussed there may be a small abscess versus cellulitis of the skin of the buttocks.  I do not see an area to cut into today, and is it is improving would continue to try symptomatic care.  Start doxycycline 1 pill twice per day to cover for infection, warm compresses or soaks at least 3-4 times per day and recheck on Thursday as discussed.  If any worsening symptoms prior to that time, return sooner.  Thank you for coming in today.    Cellulitis, Adult Cellulitis is a skin infection. The infected area is usually red and tender. This condition occurs most often in the arms and lower legs. The infection can travel to the muscles, blood, and underlying tissue and become serious. It is very important to get treated for this condition. What are the causes? Cellulitis is caused by bacteria. The bacteria enter through a break in the skin, such as a cut, burn, insect bite, open sore, or crack. What increases the risk? This condition is more likely to occur in people who:  Have a weak defense system (immune system).  Have open wounds on the skin such as cuts, burns, bites, and scrapes. Bacteria can enter the body through these open wounds.  Are older.  Have diabetes.  Have a type of long-lasting (chronic) liver disease (cirrhosis) or kidney disease.  Use IV drugs.  What are the signs or symptoms? Symptoms of this condition include:  Redness, streaking, or spotting on the skin.  Swollen area of the skin.  Tenderness or pain when an area of the skin is touched.  Warm skin.  Fever.  Chills.  Blisters.  How is this diagnosed? This condition is diagnosed based on a medical history and physical exam. You may also have tests, including:  Blood tests.  Lab tests.  Imaging tests.  How is this treated? Treatment for this condition may include:  Medicines, such as antibiotic medicines or antihistamines.  Supportive care, such as rest and application of cold or warm cloths  (cold or warm compresses) to the skin.  Hospital care, if the condition is severe.  The infection usually gets better within 1-2 days of treatment. Follow these instructions at home:  Take over-the-counter and prescription medicines only as told by your health care provider.  If you were prescribed an antibiotic medicine, take it as told by your health care provider. Do not stop taking the antibiotic even if you start to feel better.  Drink enough fluid to keep your urine clear or pale yellow.  Do not touch or rub the infected area.  Raise (elevate) the infected area above the level of your heart while you are sitting or lying down.  Apply warm or cold compresses to the affected area as told by your health care provider.  Keep all follow-up visits as told by your health care provider. This is important. These visits let your health care provider make sure a more serious infection is not developing. Contact a health care provider if:  You have a fever.  Your symptoms do not improve within 1-2 days of starting treatment.  Your bone or joint underneath the infected area becomes painful after the skin has healed.  Your infection returns in the same area or another area.  You notice a swollen bump in the infected area.  You develop new symptoms.  You have a general ill feeling (malaise) with muscle aches and pains. Get help right away if:  Your symptoms get worse.  You  feel very sleepy.  You develop vomiting or diarrhea that persists.  You notice red streaks coming from the infected area.  Your red area gets larger or turns dark in color. This information is not intended to replace advice given to you by your health care provider. Make sure you discuss any questions you have with your health care provider. Document Released: 05/12/2005 Document Revised: 12/11/2015 Document Reviewed: 06/11/2015 Elsevier Interactive Patient Education  2018 [employer].   IF you received  an x-ray today, you will receive an invoice from [facility]. Please contact [facility] at [phone] with questions or concerns regarding your invoice.   IF you received labwork today, you will receive an invoice from [place]. Please contact [facility] at [phone] with questions or concerns regarding your invoice.   Our billing staff will not be able to assist you with questions regarding bills from these companies.  You will be contacted with the lab results as soon as they are available. The fastest way to get your results is to activate your My Chart account. Instructions are located on the last page of this paperwork. If you have not heard from [place] regarding the results in 2 weeks, please contact this office.

## 2018-03-06 NOTE — Telephone Encounter (Signed)
03/06/2018 - PATIENT WAS IN THE OFFICE TO SEE DR. GREENE AND MENTIONED TO HIM THAT HE NEEDS A HANDICAP PLACARD. HE WANTS IT BECAUSE HE HAS ARTHRITIC KNEES. DR. Neva SeatGREENE SUGGESTED TO PUT A PHONE MESSAGE IN TO [name] SINCE SHE IS HIS PRIMARY CARE PHYSICIAN. Johnny Andrews SAID [name] HAS ALL OF HIS MEDICAL HISTORY. BEST PHONE [phone] (CELL) MBC

## 2018-03-06 NOTE — Progress Notes (Signed)
Subjective:  By signing my nmae below, I, Johnny Andrews, attest that this documentation has been prepared under the directions and in the prescence of Shade Flood, MD. Electronically Signed: [name], Medical Scribe 03/06/2018 at 12:38 PM.   Patient ID: Johnny Andrews, male    DOB: 12-07-1960, 57 y.o.   MRN: [id number] Chief Complaint  Patient presents with  . cyst on tailbone    noticed it this weekend. Hurts right now    HPI Johnny Andrews is a 57 y.o. male who presents to Primary Care at [facility] complaining of a possible cyst on his tailbone. History of multiple medical problem including diabetes, insulin dependent, hypothyroid diease, hyertension, spinal stenosis, restrictive lung diease, GOUT and anxiety. Others per problem list. History of Sebaceous cyst, per problem list.  Patient reports his Sebaceous cyst has been under his arm in the past. He reports that the cyst under his arm, he is able clean and tolerate it. Patient reports that cyst on tailbone started on Friday, July 19 th, about 3-4 days ago and reported pain. He has not been able to sleep due to cyst and reports being sore. He reports that he took one Tramadol this morning for pain, and reports that he feels better now. He reports that there was some drainage when he washed, and looks like cyst is going away. Denies fevers.    Patient Active Problem List   Diagnosis Date Noted  . Family history of prostate cancer 03/06/2017  . Pure hypercholesterolemia 09/13/2016  . Spinal stenosis of lumbar region 11/03/2015  . Paresthesia 09/15/2015  . Abnormality of gait 09/15/2015  . Restrictive lung disease 05/05/2015  . Asbestos exposure 05/05/2015  . Dyspnea 03/14/2015  . Numbness 05/31/2014  . Encounter for long-term (current) use of other medications 02/05/2013  . 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 Hypogonadism male 02/05/2013  . Low back pain 08/28/2012  . Elevated PSA 11/30/2011  . OTHER DISEASES  OF LUNG NOT ELSEWHERE CLASSIFIED 07/20/2010  . GOUT 04/17/2010  . SMOKER 04/17/2010  . Sleep apnea 08/22/2009  . Alcohol abuse 10/24/2008  . Sebaceous cyst 08/01/2008  . Hypothyroidism 09/19/2007  . Diabetes (HCC) 09/19/2007  . ANXIETY 09/19/2007  . Essential hypertension 09/19/2007  . OSTEOARTHRITIS 09/19/2007   Past Medical History:  Diagnosis Date  . ALCOHOL USE 10/24/2008  . ANXIETY 09/19/2007  . Cough 05/22/2008  . DIABETES MELLITUS, TYPE II 09/19/2007  . External thrombosed hemorrhoids 08/23/2008  . GOUT 04/17/2010  . HYPERLIPIDEMIA 09/19/2007  . HYPERTENSION 09/19/2007  . HYPOTHYROIDISM 09/19/2007  . Leg pain   . OSTEOARTHRITIS 09/19/2007  . OTHER DISEASES OF LUNG NOT ELSEWHERE CLASSIFIED 07/20/2010  . Rash and other nonspecific skin eruption 09/19/2007  . Restrictive lung disease   . SCROTAL ABSCESS 05/27/2010  . Sebaceous cyst 08/01/2008  . SLEEP APNEA 08/22/2009  . SMOKER 04/17/2010   Past Surgical History:  Procedure Laterality Date  . COLONOSCOPY    . NASAL SEPTUM SURGERY     No Known Allergies Prior to Admission medications   Medication Sig Start Date End Date Taking? Authorizing Provider  BD ULTRA-FINE PEN NEEDLES 29G X 12.7MM MISC USE TWO CHECK BLOOD SUGAR TWICE A DAY. APPOINTMENT NEEDED FOR FURTHER REFILLS 03/09/16  Yes Reather Littler, MD  dapagliflozin propanediol (FARXIGA) 10 MG TABS tablet Take 20 mg by mouth daily.   Yes [provider]  diclofenac (VOLTAREN) 75 MG EC tablet TAKE 1 TABLET BY MOUTH TWICE A DAY 12/21/17  Yes Katrinka Blazing,  Myrle Sheng, MD  diltiazem (TIAZAC) 180 MG 24 hr capsule Take 180 mg by mouth daily. 02/10/17  Yes [provider]  glucose blood (ONE TOUCH ULTRA TEST) test strip Two times a day for ULTRA 2 ONE TOUCH 01/05/17  Yes Ethelda Chick, MD  insulin aspart (NOVOLOG FLEXPEN) 100 UNIT/ML FlexPen Inject 75 Units into the skin 3 (three) times daily with meals. 30 units am and 50 units pm   Yes [provider]  metFORMIN (GLUCOPHAGE-XR) 500 MG  24 hr tablet Take 1,000 mg by mouth 2 (two) times daily.  09/15/15  Yes [provider]  predniSONE (DELTASONE) 20 MG tablet Take 1 tablet (20 mg total) by mouth daily with breakfast. 12/30/17  Yes Wallis Bamberg, PA-C  Respiratory Therapy Supplies (ADULT MASK) MISC 2 Devices by Does not apply route once. 11/24/11  Yes Oretha Milch, MD  simvastatin (ZOCOR) 20 MG tablet Take 20 mg by mouth daily.   Yes [provider]  topiramate (TOPAMAX) 50 MG tablet Take 2 tablets (100 mg total) by mouth at bedtime. 11/16/17  Yes Ethelda Chick, MD  TRESIBA FLEXTOUCH 200 UNIT/ML SOPN INJECT 100 UNITS TWICE A DAY 12/17/16  Yes [provider]  ALPRAZolam (XANAX) 0.5 MG tablet Take 1 tablet (0.5 mg total) by mouth at bedtime as needed for anxiety. 11/16/17   Ethelda Chick, MD  Levothyroxine Sodium 137 MCG CAPS levothyroxine 137 mcg capsule  Take 2 capsules every day by oral route.    [provider]  lisinopril (PRINIVIL,ZESTRIL) 20 MG tablet Take 20 mg by mouth 2 (two) times daily. 02/23/18   [provider]   Social History   Socioeconomic History  . Marital status: Married    Spouse name: Not on file  . Number of children: 2  . Years of education: BS  . Highest education level: Not on file  Occupational History  . Occupation: Software engineer: [employer]  Social Needs  . Financial resource strain: Not on file  . Food insecurity:    Worry: Not on file    Inability: Not on file  . Transportation needs:    Medical: Not on file    Non-medical: Not on file  Tobacco Use  . Smoking status: Former Smoker    Packs/day: 0.50    Years: 30.00    Pack years: 15.00    Last attempt to quit: 11/05/2012    Years since quitting: 5.3  . Smokeless tobacco: Never Used  . Tobacco comment: 30 years at most off and on smoking 03/14/15  Substance and Sexual Activity  . Alcohol use: Yes    Alcohol/week: 0.0 oz    Comment: 2 drinks per day  . Drug use: Yes    Comment:  Occasional Marijuana use  . Sexual activity: Not on file  Lifestyle  . Physical activity:    Days per week: Not on file    Minutes per session: Not on file  . Stress: Not on file  Relationships  . Social connections:    Talks on phone: Not on file    Gets together: Not on file    Attends religious service: Not on file    Active member of club or organization: Not on file    Attends meetings of clubs or organizations: Not on file    Relationship status: Not on file  . Intimate partner violence:    Fear of current or ex partner: Not on file  Emotionally abused: Not on file    Physically abused: Not on file    Forced sexual activity: Not on file  Other Topics Concern  . Not on file  Social History Narrative   Originally from [place]. Has lived in [place], [place], [place], & [place] since 1993. 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 Currently has a dog & cats. Previously had a parrot (conure) 12 years ago for 1 year but in same house.  Has also had excessive exposure to pigeon feces. He has had very brief exposure to asbestos around 2000. No hot tub exposure. He has inhaled chemical fumes/gases/refridgerants through his work.      Marital status: married x 26 years      Children: 2 children (19, 70); no grandchildren      Lives: Lives at home with his wife      Employment: [employer]      Tobacco: none; quit in 2013; smoked x 1/2 ppd x 20 years      Alcohol:  10 drinks; usually 2 drinks per day and then skip two days.      Drugs:  None      Exercise: none; job is physically demanding      Right-handed.   1 cup caffeine daily.     Review of Systems  Constitutional: Negative for fever.      Objective:   Physical Exam  Skin: No erythema.      Vitals:   03/06/18 1214  BP: 128/76  Pulse: 85  Temp: 97.8 F (36.6 C)  TempSrc: Oral  SpO2: 93%  Weight: (!) 388 lb 14.4 oz (176.4 kg)  Height: 6\' 3"  (1.905 m)   At the  upper buttock crease, there is some dry skin. Some dry skin with superficial fissure at the apex, without surrounding edema or erythema, approximately 7 cm down from the apex to the left buttock and just lateral to the crease, there is induration, without fluctuance. There is also minimal induration at the right side, without fluctuance. Slightly irritated skin on the crease without exudate, or opening.      Assessment & Plan:    Johnny Andrews is a 57 y.o. male Cellulitis of buttock - Plan: doxycycline (VIBRA-TABS) 100 MG tablet   Cellulitis with possible early abscess but no apparent area to incise at this time.  Continue symptomatic care with warm compresses/soaks, and start doxycycline.  Side effects and risks of medications discussed and RTC precautions given.  Meds ordered this encounter  Medications  . doxycycline (VIBRA-TABS) 100 MG tablet    Sig: Take 1 tablet (100 mg total) by mouth 2 (two) times daily.    Dispense:  20 tablet    Refill:  0   Patient Instructions   As we discussed there may be a small abscess versus cellulitis of the skin of the buttocks.  I do not see an area to cut into today, and is it is improving would continue to try symptomatic care.  Start doxycycline 1 pill twice per day to cover for infection, warm compresses or soaks at least 3-4 times per day and recheck on Thursday as discussed.  If any worsening symptoms prior to that time, return sooner.  Thank you for coming in today.    Cellulitis, Adult Cellulitis is a skin infection. The infected area is usually red and tender. This condition occurs most often in the arms  and lower legs. The infection can travel to the muscles, blood, and underlying tissue and become serious. It is very important to get treated for this condition. What are the causes? Cellulitis is caused by bacteria. The bacteria enter through a break in the skin, such as a cut, burn, insect bite, open sore, or crack. What increases the  risk? This condition is more likely to occur in people who:  Have a weak defense system (immune system).  Have open wounds on the skin such as cuts, burns, bites, and scrapes. Bacteria can enter the body through these open wounds.  Are older.  Have diabetes.  Have a type of long-lasting (chronic) liver disease (cirrhosis) or kidney disease.  Use IV drugs.  What are the signs or symptoms? Symptoms of this condition include:  Redness, streaking, or spotting on the skin.  Swollen area of the skin.  Tenderness or pain when an area of the skin is touched.  Warm skin.  Fever.  Chills.  Blisters.  How is this diagnosed? This condition is diagnosed based on a medical history and physical exam. You may also have tests, including:  Blood tests.  Lab tests.  Imaging tests.  How is this treated? Treatment for this condition may include:  Medicines, such as antibiotic medicines or antihistamines.  Supportive care, such as rest and application of cold or warm cloths (cold or warm compresses) to the skin.  Hospital care, if the condition is severe.  The infection usually gets better within 1-2 days of treatment. Follow these instructions at home:  Take over-the-counter and prescription medicines only as told by your health care provider.  If you were prescribed an antibiotic medicine, take it as told by your health care provider. Do not stop taking the antibiotic even if you start to feel better.  Drink enough fluid to keep your urine clear or pale yellow.  Do not touch or rub the infected area.  Raise (elevate) the infected area above the level of your heart while you are sitting or lying down.  Apply warm or cold compresses to the affected area as told by your health care provider.  Keep all follow-up visits as told by your health care provider. This is important. These visits let your health care provider make sure a more serious infection is not  developing. Contact a health care provider if:  You have a fever.  Your symptoms do not improve within 1-2 days of starting treatment.  Your bone or joint underneath the infected area becomes painful after the skin has healed.  Your infection returns in the same area or another area.  You notice a swollen bump in the infected area.  You develop new symptoms.  You have a general ill feeling (malaise) with muscle aches and pains. Get help right away if:  Your symptoms get worse.  You feel very sleepy.  You develop vomiting or diarrhea that persists.  You notice red streaks coming from the infected area.  Your red area gets larger or turns dark in color. This information is not intended to replace advice given to you by your health care provider. Make sure you discuss any questions you have with your health care provider. Document Released: 05/12/2005 Document Revised: 12/11/2015 Document Reviewed: 06/11/2015 Elsevier Interactive Patient Education  2018 [employer]   IF you received an x-ray today, you will receive an invoice from [facility]. Please contact [facility] at [phone] with questions or concerns regarding your invoice.   IF  you received labwork today, you will receive an invoice from [place]. Please contact [facility] at [phone] with questions or concerns regarding your invoice.   Our billing staff will not be able to assist you with questions regarding bills from these companies.  You will be contacted with the lab results as soon as they are available. The fastest way to get your results is to activate your My Chart account. Instructions are located on the last page of this paperwork. If you have not heard from [place] regarding the results in 2 weeks, please contact this office.       I personally performed the services described in this documentation, which was scribed in my presence. The recorded information has been reviewed and  considered for accuracy and completeness, addended by me as needed, and agree with information above.  Signed,   Meredith Staggers, MD Primary Care at [facility].  03/08/18 3:32 PM

## 2018-03-07 NOTE — Telephone Encounter (Signed)
Please advise 

## 2018-03-07 NOTE — Telephone Encounter (Signed)
Happy to provide handicap placard for chronic knee pain.  Upon review of recent ortho note: end-stage arthritic changes medially B knees.  Please complete handicap placard and place in my box for signing.

## 2018-03-08 ENCOUNTER — Encounter: Payer: Self-pay | Admitting: Family Medicine

## 2018-03-09 ENCOUNTER — Ambulatory Visit: Payer: BC Managed Care – PPO | Admitting: Urgent Care

## 2018-03-10 NOTE — Telephone Encounter (Signed)
Handicap placard is at 104 front desk

## 2018-03-22 ENCOUNTER — Encounter: Payer: Self-pay | Admitting: Family Medicine

## 2018-04-18 DIAGNOSIS — R200 Anesthesia of skin: Secondary | ICD-10-CM | POA: Insufficient documentation

## 2018-04-19 ENCOUNTER — Other Ambulatory Visit: Payer: Self-pay | Admitting: Family Medicine

## 2018-04-19 MED ORDER — GLUCOSE BLOOD VI STRP: ORAL_STRIP | 1 refills | Status: DC

## 2018-04-19 NOTE — Telephone Encounter (Signed)
Xanax refill Last Refill:11/16/17 #30 Last OV: 11/16/17 Next OV:04/28/18 with Dr. Alvy Bimler PCP: former pt of Dr. Katrinka Blazing Pharmacy:[facility] [address]  Refill of Glucose test strips sent to requested pharmacy.

## 2018-04-19 NOTE — Telephone Encounter (Signed)
Copied from CRM [id number]. Topic: Quick Communication - Rx Refill/Question >> Apr 19, 2018  5:04 PM Avie Arenas L, NT wrote: Medication: glucose blood (ONE TOUCH ULTRA TEST) test strip and also ALPRAZolam Prudy Feeler) 0.5 MG tablet  Has the patient contacted their pharmacy? Yes  (Agent: If no, request that the patient contact the pharmacy for the refill. (Agent: If yes, when and what did the pharmacy advise? Pharmacy states they sent t over request and is was not filled   Preferred Pharmacy (with phone number or street name [id number] [place], [place] - [address] [phone] (Phone) [phone] (Fax) Patient has a appointment on 04/28/18 to est care .   Agent: Please be advised that RX refills may take up to 3 business days. We ask that you follow-up with your pharmacy.

## 2018-04-20 MED ORDER — ALPRAZOLAM 0.5 MG PO TABS: 0.5000 mg | ORAL_TABLET | Freq: Every evening | ORAL | 2 refills | Status: DC | PRN

## 2018-04-20 NOTE — Telephone Encounter (Signed)
Patient is requesting a refill of the following medications: Requested Prescriptions   Pending Prescriptions Disp Refills  . ALPRAZolam (XANAX) 0.5 MG tablet 30 tablet 2    Sig: Take 1 tablet (0.5 mg total) by mouth at bedtime as needed for anxiety.   Signed Prescriptions Disp Refills  . glucose blood (ONE TOUCH ULTRA TEST) test strip 100 each 1    Sig: Two times a day for ULTRA 2 ONE TOUCH    Authorizing Provider: Georgina Quint    Ordering User: Amado Coe    Date of patient request: 04/19/2018 Last office visit: 03/06/2018 Date of last refill: 11/16/2017 Last refill amount: 30 RF 2  Follow up time period per chart:

## 2018-04-24 DIAGNOSIS — G5601 Carpal tunnel syndrome, right upper limb: Secondary | ICD-10-CM | POA: Insufficient documentation

## 2018-04-24 DIAGNOSIS — E1142 Type 2 diabetes mellitus with diabetic polyneuropathy: Secondary | ICD-10-CM | POA: Insufficient documentation

## 2018-04-25 ENCOUNTER — Telehealth: Payer: Self-pay | Admitting: Emergency Medicine

## 2018-04-25 ENCOUNTER — Other Ambulatory Visit: Payer: Self-pay | Admitting: *Deleted

## 2018-04-25 DIAGNOSIS — G6289 Other specified polyneuropathies: Secondary | ICD-10-CM

## 2018-04-25 MED ORDER — TOPIRAMATE 50 MG PO TABS: 100.0000 mg | ORAL_TABLET | Freq: Every day | ORAL | 0 refills | Status: DC

## 2018-04-25 NOTE — Telephone Encounter (Signed)
Copied from CRM [id number]. Topic: Quick Communication - Rx Refill/Question >> Apr 25, 2018  8:41 AM Baldo Daub L wrote: Medication: topiramate (TOPAMAX) 50 MG tablet  Has the patient contacted their pharmacy? Yes - pharmacy states they haven't heard from [place] (Agent: If no, request that the patient contact the pharmacy for the refill.) (Agent: If yes, when and what did the pharmacy advise?)  Preferred Pharmacy (with phone number or street name): [id number] [place], [place] - [address] [phone] (Phone) [phone] (Fax)  Agent: Please be advised that RX refills may take up to 3 business days. We ask that you follow-up with your pharmacy.

## 2018-04-28 ENCOUNTER — Other Ambulatory Visit: Payer: Self-pay

## 2018-04-28 ENCOUNTER — Encounter: Payer: Self-pay | Admitting: Emergency Medicine

## 2018-04-28 ENCOUNTER — Ambulatory Visit: Payer: BC Managed Care – PPO | Admitting: Emergency Medicine

## 2018-04-28 VITALS — BP 129/73 | HR 76 | Temp 98.4°F | Resp 18 | Ht 76.0 in | Wt 378.8 lb

## 2018-04-28 DIAGNOSIS — G8929 Other chronic pain: Secondary | ICD-10-CM

## 2018-04-28 DIAGNOSIS — E785 Hyperlipidemia, unspecified: Secondary | ICD-10-CM | POA: Diagnosis not present

## 2018-04-28 DIAGNOSIS — M25562 Pain in left knee: Secondary | ICD-10-CM

## 2018-04-28 DIAGNOSIS — E1142 Type 2 diabetes mellitus with diabetic polyneuropathy: Secondary | ICD-10-CM | POA: Diagnosis not present

## 2018-04-28 DIAGNOSIS — Z794 Long term (current) use of insulin: Secondary | ICD-10-CM

## 2018-04-28 DIAGNOSIS — M25561 Pain in right knee: Secondary | ICD-10-CM

## 2018-04-28 DIAGNOSIS — I10 Essential (primary) hypertension: Secondary | ICD-10-CM | POA: Diagnosis not present

## 2018-04-28 NOTE — Patient Instructions (Addendum)
If you have lab work done today you will be contacted with your lab results within the next 2 weeks.  If you have not heard from [place] then please contact us. The fastest way to get your results is to register for My Chart.   IF you received an x-ray today, you will receive an invoice from [facility]. Please contact [facility] at [phone] with questions or concerns regarding your invoice.   IF you received labwork today, you will receive an invoice from [facility]. Please contact [facility] at [phone] with questions or concerns regarding your invoice.   Our billing staff will not be able to assist you with questions regarding bills from these companies.  You will be contacted with the lab results as soon as they are available. The fastest way to get your results is to activate your My Chart account. Instructions are located on the last page of this paperwork. If you have not heard from [place] regarding the results in 2 weeks, please contact this office.     Health Maintenance, Male A healthy lifestyle and preventive care is important for your health and wellness. Ask your health care provider about what schedule of regular examinations is right for you. What should I know about weight and diet? Eat a Healthy Diet  Eat plenty of vegetables, fruits, whole grains, low-fat dairy products, and lean protein.  Do not eat a lot of foods high in solid fats, added sugars, or salt.  Maintain a Healthy Weight Regular exercise can help you achieve or maintain a healthy weight. You should:  Do at least 150 minutes of exercise each week. The exercise should increase your heart rate and make you sweat (moderate-intensity exercise).  Do strength-training exercises at least twice a week.  Watch Your Levels of Cholesterol and Blood Lipids  Have your blood tested for lipids and cholesterol every 5 years starting at 57 years of age. If you are at high risk for heart disease, you  should start having your blood tested when you are 57 years old. You may need to have your cholesterol levels checked more often if: ? Your lipid or cholesterol levels are high. ? You are older than 57 years of age. ? You are at high risk for heart disease.  What should I know about cancer screening? Many types of cancers can be detected early and may often be prevented. Lung Cancer  You should be screened every year for lung cancer if: ? You are a current smoker who has smoked for at least 30 years. ? You are a former smoker who has quit within the past 15 years.  Talk to your health care provider about your screening options, when you should start screening, and how often you should be screened.  Colorectal Cancer  Routine colorectal cancer screening usually begins at 56 years of age and should be repeated every 5-10 years until you are 57 years old. You may need to be screened more often if early forms of precancerous polyps or small growths are found. Your health care provider may recommend screening at an earlier age if you have risk factors for colon cancer.  Your health care provider may recommend using home test kits to check for hidden blood in the stool.  A small camera at the end of a tube can be used to examine your colon (sigmoidoscopy or colonoscopy). This checks for the earliest forms of colorectal cancer.  Prostate and Testicular Cancer  Depending on  your age and overall health, your health care provider may do certain tests to screen for prostate and testicular cancer.  Talk to your health care provider about any symptoms or concerns you have about testicular or prostate cancer.  Skin Cancer  Check your skin from head to toe regularly.  Tell your health care provider about any new moles or changes in moles, especially if: ? There is a change in a mole's size, shape, or color. ? You have a mole that is larger than a pencil eraser.  Always use sunscreen. Apply  sunscreen liberally and repeat throughout the day.  Protect yourself by wearing long sleeves, pants, a wide-brimmed hat, and sunglasses when outside.  What should I know about heart disease, diabetes, and high blood pressure?  If you are 55-81 years of age, have your blood pressure checked every 3-5 years. If you are 30 years of age or older, have your blood pressure checked every year. You should have your blood pressure measured twice-once when you are at a hospital or clinic, and once when you are not at a hospital or clinic. Record the average of the two measurements. To check your blood pressure when you are not at a hospital or clinic, you can use: ? An automated blood pressure machine at a pharmacy. ? A home blood pressure monitor.  Talk to your health care provider about your target blood pressure.  If you are between 43-24 years old, ask your health care provider if you should take aspirin to prevent heart disease.  Have regular diabetes screenings by checking your fasting blood sugar level. ? If you are at a normal weight and have a low risk for diabetes, have this test once every three years after the age of 25. ? If you are overweight and have a high risk for diabetes, consider being tested at a younger age or more often.  A one-time screening for abdominal aortic aneurysm (AAA) by ultrasound is recommended for men aged 79-75 years who are current or former smokers. What should I know about preventing infection? Hepatitis B If you have a higher risk for hepatitis B, you should be screened for this virus. Talk with your health care provider to find out if you are at risk for hepatitis B infection. Hepatitis C Blood testing is recommended for:  Everyone born from [birth year] through 1965.  Anyone with known risk factors for hepatitis C.  Sexually Transmitted Diseases (STDs)  You should be screened each year for STDs including gonorrhea and chlamydia if: ? You are sexually active  and are younger than 57 years of age. ? You are older than 57 years of age and your health care provider tells you that you are at risk for this type of infection. ? Your sexual activity has changed since you were last screened and you are at an increased risk for chlamydia or gonorrhea. Ask your health care provider if you are at risk.  Talk with your health care provider about whether you are at high risk of being infected with HIV. Your health care provider may recommend a prescription medicine to help prevent HIV infection.  What else can I do?  Schedule regular health, dental, and eye exams.  Stay current with your vaccines (immunizations).  Do not use any tobacco products, such as cigarettes, chewing tobacco, and e-cigarettes. If you need help quitting, ask your health care provider.  Limit alcohol intake to no more than 2 drinks per day. One drink equals 12  ounces of beer, 5 ounces of wine, or 1 ounces of hard liquor.  Do not use street drugs.  Do not share needles.  Ask your health care provider for help if you need support or information about quitting drugs.  Tell your health care provider if you often feel depressed.  Tell your health care provider if you have ever been abused or do not feel safe at home. This information is not intended to replace advice given to you by your health care provider. Make sure you discuss any questions you have with your health care provider. Document Released: 01/29/2008 Document Revised: 03/31/2016 Document Reviewed: 05/06/2015 Elsevier Interactive Patient Education  2018 [employer] Inc.  Hypertension Hypertension, commonly called high blood pressure, is when the force of blood pumping through the arteries is too strong. The arteries are the blood vessels that carry blood from the heart throughout the body. Hypertension forces the heart to work harder to pump blood and may cause arteries to become narrow or stiff. Having untreated or  uncontrolled hypertension can cause heart attacks, strokes, kidney disease, and other problems. A blood pressure reading consists of a higher number over a lower number. Ideally, your blood pressure should be below 120/80. The first ("top") number is called the systolic pressure. It is a measure of the pressure in your arteries as your heart beats. The second ("bottom") number is called the diastolic pressure. It is a measure of the pressure in your arteries as the heart relaxes. What are the causes? The cause of this condition is not known. What increases the risk? Some risk factors for high blood pressure are under your control. Others are not. Factors you can change  Smoking.  Having type 2 diabetes mellitus, high cholesterol, or both.  Not getting enough exercise or physical activity.  Being overweight.  Having too much fat, sugar, calories, or salt (sodium) in your diet.  Drinking too much alcohol. Factors that are difficult or impossible to change  Having chronic kidney disease.  Having a family history of high blood pressure.  Age. Risk increases with age.  Race. You may be at higher risk if you are African-American.  Gender. Men are at higher risk than women before age 45. After age 65, women are at higher risk than men.  Having obstructive sleep apnea.  Stress. What are the signs or symptoms? Extremely high blood pressure (hypertensive crisis) may cause:  Headache.  Anxiety.  Shortness of breath.  Nosebleed.  Nausea and vomiting.  Severe chest pain.  Jerky movements you cannot control (seizures).  How is this diagnosed? This condition is diagnosed by measuring your blood pressure while you are seated, with your arm resting on a surface. The cuff of the blood pressure monitor will be placed directly against the skin of your upper arm at the level of your heart. It should be measured at least twice using the same arm. Certain conditions can cause a  difference in blood pressure between your right and left arms. Certain factors can cause blood pressure readings to be lower or higher than normal (elevated) for a short period of time:  When your blood pressure is higher when you are in a health care provider's office than when you are at home, this is called white coat hypertension. Most people with this condition do not need medicines.  When your blood pressure is higher at home than when you are in a health care provider's office, this is called masked hypertension. Most people with this   condition may need medicines to control blood pressure.  If you have a high blood pressure reading during one visit or you have normal blood pressure with other risk factors:  You may be asked to return on a different day to have your blood pressure checked again.  You may be asked to monitor your blood pressure at home for 1 week or longer.  If you are diagnosed with hypertension, you may have other blood or imaging tests to help your health care provider understand your overall risk for other conditions. How is this treated? This condition is treated by making healthy lifestyle changes, such as eating healthy foods, exercising more, and reducing your alcohol intake. Your health care provider may prescribe medicine if lifestyle changes are not enough to get your blood pressure under control, and if:  Your systolic blood pressure is above 130.  Your diastolic blood pressure is above 80.  Your personal target blood pressure may vary depending on your medical conditions, your age, and other factors. Follow these instructions at home: Eating and drinking  Eat a diet that is high in fiber and potassium, and low in sodium, added sugar, and fat. An example eating plan is called the DASH (Dietary Approaches to Stop Hypertension) diet. To eat this way: ? Eat plenty of fresh fruits and vegetables. Try to fill half of your plate at each meal with fruits and  vegetables. ? Eat whole grains, such as whole wheat pasta, brown rice, or whole grain bread. Fill about one quarter of your plate with whole grains. ? Eat or drink low-fat dairy products, such as skim milk or low-fat yogurt. ? Avoid fatty cuts of meat, processed or cured meats, and poultry with skin. Fill about one quarter of your plate with lean proteins, such as fish, chicken without skin, beans, eggs, and tofu. ? Avoid premade and processed foods. These tend to be higher in sodium, added sugar, and fat.  Reduce your daily sodium intake. Most people with hypertension should eat less than 1,500 mg of sodium a day.  Limit alcohol intake to no more than 1 drink a day for nonpregnant women and 2 drinks a day for men. One drink equals 12 oz of beer, 5 oz of wine, or 1 oz of hard liquor. Lifestyle  Work with your health care provider to maintain a healthy body weight or to lose weight. Ask what an ideal weight is for you.  Get at least 30 minutes of exercise that causes your heart to beat faster (aerobic exercise) most days of the week. Activities may include walking, swimming, or biking.  Include exercise to strengthen your muscles (resistance exercise), such as pilates or lifting weights, as part of your weekly exercise routine. Try to do these types of exercises for 30 minutes at least 3 days a week.  Do not use any products that contain nicotine or tobacco, such as cigarettes and e-cigarettes. If you need help quitting, ask your health care provider.  Monitor your blood pressure at home as told by your health care provider.  Keep all follow-up visits as told by your health care provider. This is important. Medicines  Take over-the-counter and prescription medicines only as told by your health care provider. Follow directions carefully. Blood pressure medicines must be taken as prescribed.  Do not skip doses of blood pressure medicine. Doing this puts you at risk for problems and can make  the medicine less effective.  Ask your health care provider about side effects or   reactions to medicines that you should watch for. Contact a health care provider if:  You think you are having a reaction to a medicine you are taking.  You have headaches that keep coming back (recurring).  You feel dizzy.  You have swelling in your ankles.  You have trouble with your vision. Get help right away if:  You develop a severe headache or confusion.  You have unusual weakness or numbness.  You feel faint.  You have severe pain in your chest or abdomen.  You vomit repeatedly.  You have trouble breathing. Summary  Hypertension is when the force of blood pumping through your arteries is too strong. If this condition is not controlled, it may put you at risk for serious complications.  Your personal target blood pressure may vary depending on your medical conditions, your age, and other factors. For most people, a normal blood pressure is less than 120/80.  Hypertension is treated with lifestyle changes, medicines, or a combination of both. Lifestyle changes include weight loss, eating a healthy, low-sodium diet, exercising more, and limiting alcohol. This information is not intended to replace advice given to you by your health care provider. Make sure you discuss any questions you have with your health care provider. Document Released: 08/02/2005 Document Revised: 06/30/2016 Document Reviewed: 06/30/2016 Elsevier Interactive Patient Education  2018 [employer]  Diabetes Mellitus and Nutrition When you have diabetes (diabetes mellitus), it is very important to have healthy eating habits because your blood sugar (glucose) levels are greatly affected by what you eat and drink. Eating healthy foods in the appropriate amounts, at about the same times every day, can help you:  Control your blood glucose.  Lower your risk of heart disease.  Improve your blood pressure.  Reach or  maintain a healthy weight.  Every person with diabetes is different, and each person has different needs for a meal plan. Your health care provider may recommend that you work with a diet and nutrition specialist (dietitian) to make a meal plan that is best for you. Your meal plan may vary depending on factors such as:  The calories you need.  The medicines you take.  Your weight.  Your blood glucose, blood pressure, and cholesterol levels.  Your activity level.  Other health conditions you have, such as heart or kidney disease.  How do carbohydrates affect me? Carbohydrates affect your blood glucose level more than any other type of food. Eating carbohydrates naturally increases the amount of glucose in your blood. Carbohydrate counting is a method for keeping track of how many carbohydrates you eat. Counting carbohydrates is important to keep your blood glucose at a healthy level, especially if you use insulin or take certain oral diabetes medicines. It is important to know how many carbohydrates you can safely have in each meal. This is different for every person. Your dietitian can help you calculate how many carbohydrates you should have at each meal and for snack. Foods that contain carbohydrates include:  Bread, cereal, rice, pasta, and crackers.  Potatoes and corn.  Peas, beans, and lentils.  Milk and yogurt.  Fruit and juice.  Desserts, such as cakes, cookies, ice cream, and candy.  How does alcohol affect me? Alcohol can cause a sudden decrease in blood glucose (hypoglycemia), especially if you use insulin or take certain oral diabetes medicines. Hypoglycemia can be a life-threatening condition. Symptoms of hypoglycemia (sleepiness, dizziness, and confusion) are similar to symptoms of having too much alcohol. If your health care provider  says that alcohol is safe for you, follow these guidelines:  Limit alcohol intake to no more than 1 drink per day for nonpregnant  women and 2 drinks per day for men. One drink equals 12 oz of beer, 5 oz of wine, or 1 oz of hard liquor.  Do not drink on an empty stomach.  Keep yourself hydrated with water, diet soda, or unsweetened iced tea.  Keep in mind that regular soda, juice, and other mixers may contain a lot of sugar and must be counted as carbohydrates.  What are tips for following this plan? Reading food labels  Start by checking the serving size on the label. The amount of calories, carbohydrates, fats, and other nutrients listed on the label are based on one serving of the food. Many foods contain more than one serving per package.  Check the total grams (g) of carbohydrates in one serving. You can calculate the number of servings of carbohydrates in one serving by dividing the total carbohydrates by 15. For example, if a food has 30 g of total carbohydrates, it would be equal to 2 servings of carbohydrates.  Check the number of grams (g) of saturated and trans fats in one serving. Choose foods that have low or no amount of these fats.  Check the number of milligrams (mg) of sodium in one serving. Most people should limit total sodium intake to less than 2,300 mg per day.  Always check the nutrition information of foods labeled as "low-fat" or "nonfat". These foods may be higher in added sugar or refined carbohydrates and should be avoided.  Talk to your dietitian to identify your daily goals for nutrients listed on the label. Shopping  Avoid buying canned, premade, or processed foods. These foods tend to be high in fat, sodium, and added sugar.  Shop around the outside edge of the grocery store. This includes fresh fruits and vegetables, bulk grains, fresh meats, and fresh dairy. Cooking  Use low-heat cooking methods, such as baking, instead of high-heat cooking methods like deep frying.  Cook using healthy oils, such as olive, canola, or sunflower oil.  Avoid cooking with butter, cream, or high-fat  meats. Meal planning  Eat meals and snacks regularly, preferably at the same times every day. Avoid going long periods of time without eating.  Eat foods high in fiber, such as fresh fruits, vegetables, beans, and whole grains. Talk to your dietitian about how many servings of carbohydrates you can eat at each meal.  Eat 4-6 ounces of lean protein each day, such as lean meat, chicken, fish, eggs, or tofu. 1 ounce is equal to 1 ounce of meat, chicken, or fish, 1 egg, or 1/4 cup of tofu.  Eat some foods each day that contain healthy fats, such as avocado, nuts, seeds, and fish. Lifestyle   Check your blood glucose regularly.  Exercise at least 30 minutes 5 or more days each week, or as told by your health care provider.  Take medicines as told by your health care provider.  Do not use any products that contain nicotine or tobacco, such as cigarettes and e-cigarettes. If you need help quitting, ask your health care provider.  Work with a Veterinary surgeon or diabetes educator to identify strategies to manage stress and any emotional and social challenges. What are some questions to ask my health care provider?  Do I need to meet with a diabetes educator?  Do I need to meet with a dietitian?  What number can I call  if I have questions?  When are the best times to check my blood glucose? Where to find more information:  American Diabetes Association: diabetes.org/food-and-fitness/food  Academy of Nutrition and Dietetics: https://www.vargas.com/  [employer] of Diabetes and Digestive and Kidney Diseases (NIH): FindJewelers.cz Summary  A healthy meal plan will help you control your blood glucose and maintain a healthy lifestyle.  Working with a diet and nutrition specialist (dietitian) can help you make a meal plan that is best for you.  Keep in mind that carbohydrates  and alcohol have immediate effects on your blood glucose levels. It is important to count carbohydrates and to use alcohol carefully. This information is not intended to replace advice given to you by your health care provider. Make sure you discuss any questions you have with your health care provider. Document Released: 04/29/2005 Document Revised: 09/06/2016 Document Reviewed: 09/06/2016 Elsevier Interactive Patient Education  [employer].

## 2018-04-28 NOTE — Progress Notes (Signed)
Lab Results  Component Value Date   HGBA1C 8.6 11/16/2017   BP Readings from Last 3 Encounters:  04/28/18 129/73  03/06/18 128/76  12/30/17 (!) 142/86   Wt Readings from Last 3 Encounters:  04/28/18 (!) 378 lb 12.8 oz (171.8 kg)  03/06/18 (!) 388 lb 14.4 oz (176.4 kg)  12/30/17 (!) 406 lb (184.2 kg)   Lab Results  Component Value Date   CHOL 157 11/16/2017   HDL 40 11/16/2017   LDLCALC 70 11/16/2017   LDLDIRECT 112.3 08/21/2014   TRIG 236 (H) 11/16/2017   CHOLHDL 3.9 11/16/2017   Johnny Andrews 57 y.o.   Chief Complaint  Patient presents with  . Establish Care  . Arthritis    both per patient will discus about having knee surgery    HISTORY OF PRESENT ILLNESS: This is a 57 y.o. male here to establish care.  First visit with me. Johnny Andrews has several chronic medical problems that include the following: 1.  Insulin-dependent diabetes.  Seen endocrinologist.  Last time he saw her was 1 month ago.  States his last hemoglobin A1c was 5.1. 2.  Hypertension.  On lisinopril and diltiazem. 3.  High triglycerides.  Takes a statin.  Zocor. 4.  Chronic bilateral knee pain.  States he is has no cartilage left in the knees.  Sees orthopedist.  Needs bilateral knee replacement. 5.  Morbid obesity.  See nutritionist.  Has lost 30 pounds in the past several months.  Scheduled for bariatric surgery. 6.  Thyroid disorder.  On Synthroid.  Stable. 7.  Osteoarthritis of both knees. Clinically very stable and making progress.  Trying to lose weight before undergoing surgeries.  Doing well.  Compliant with medications and chronic medical problems under control. Advised to take Tylenol for pain.  Discouraged to use NSAIDs. HPI   Prior to Admission medications   Medication Sig Start Date End Date Taking? Authorizing Provider  ALPRAZolam Prudy Feeler) 0.5 MG tablet Take 1 tablet (0.5 mg total) by mouth at bedtime as needed for anxiety. 04/20/18  Yes Collie Siad A, MD  dapagliflozin propanediol  (FARXIGA) 10 MG TABS tablet Take 20 mg by mouth daily.   Yes [provider]  diclofenac (VOLTAREN) 75 MG EC tablet TAKE 1 TABLET BY MOUTH TWICE A DAY 12/21/17  Yes Ethelda Chick, MD  diltiazem [facility]) 180 MG 24 hr capsule Take 180 mg by mouth daily. 02/10/17  Yes [provider]  insulin aspart (NOVOLOG FLEXPEN) 100 UNIT/ML FlexPen Inject 75 Units into the skin 3 (three) times daily with meals. 30 units am and 50 units pm   Yes [provider]  Levothyroxine Sodium 137 MCG CAPS levothyroxine 137 mcg capsule  Take 2 capsules every day by oral route.   Yes [provider]  lisinopril (PRINIVIL,ZESTRIL) 20 MG tablet Take 20 mg by mouth 2 (two) times daily. 02/23/18  Yes [provider]  metFORMIN (GLUCOPHAGE-XR) 500 MG 24 hr tablet Take 1,000 mg by mouth 2 (two) times daily.  09/15/15  Yes [provider]  simvastatin (ZOCOR) 20 MG tablet Take 20 mg by mouth daily.   Yes [provider]  topiramate (TOPAMAX) 50 MG tablet Take 2 tablets (100 mg total) by mouth at bedtime. 04/25/18  Yes Cariann Kinnamon, Eilleen Kempf, MD  TRESIBA FLEXTOUCH 200 UNIT/ML SOPN INJECT 100 UNITS TWICE A DAY 12/17/16  Yes [provider]  BD ULTRA-FINE PEN NEEDLES 29G X 12.7MM MISC USE TWO CHECK BLOOD SUGAR TWICE A DAY. APPOINTMENT NEEDED FOR  FURTHER REFILLS 03/09/16   Reather Littler, MD  glucose blood (ONE TOUCH ULTRA TEST) test strip Two times a day for ULTRA 2 ONE TOUCH 04/19/18   Georgina Quint, MD  predniSONE (DELTASONE) 20 MG tablet Take 1 tablet (20 mg total) by mouth daily with breakfast. Patient not taking: Reported on 04/28/2018 12/30/17   Wallis Bamberg, PA-C  Respiratory Therapy Supplies (ADULT MASK) MISC 2 Devices by Does not apply route once. 11/24/11   Oretha Milch, MD    No Known Allergies  Patient Active Problem List   Diagnosis Date Noted  . Family history of prostate cancer 03/06/2017  . Pure hypercholesterolemia 09/13/2016  . Spinal stenosis of  lumbar region 11/03/2015  . Abnormality of gait 09/15/2015  . Restrictive lung disease 05/05/2015  . 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 Hypogonadism male 02/05/2013  . Elevated PSA 11/30/2011  . OTHER DISEASES OF LUNG NOT ELSEWHERE CLASSIFIED 07/20/2010  . GOUT 04/17/2010  . SMOKER 04/17/2010  . Sleep apnea 08/22/2009  . Alcohol abuse 10/24/2008  . Hypothyroidism 09/19/2007  . Diabetes (HCC) 09/19/2007  . Essential hypertension 09/19/2007  . OSTEOARTHRITIS 09/19/2007    Past Medical History:  Diagnosis Date  . ALCOHOL USE 10/24/2008  . ANXIETY 09/19/2007  . Cough 05/22/2008  . DIABETES MELLITUS, TYPE II 09/19/2007  . External thrombosed hemorrhoids 08/23/2008  . GOUT 04/17/2010  . HYPERLIPIDEMIA 09/19/2007  . HYPERTENSION 09/19/2007  . HYPOTHYROIDISM 09/19/2007  . Leg pain   . OSTEOARTHRITIS 09/19/2007  . OTHER DISEASES OF LUNG NOT ELSEWHERE CLASSIFIED 07/20/2010  . Rash and other nonspecific skin eruption 09/19/2007  . Restrictive lung disease   . SCROTAL ABSCESS 05/27/2010  . Sebaceous cyst 08/01/2008  . SLEEP APNEA 08/22/2009  . SMOKER 04/17/2010    Past Surgical History:  Procedure Laterality Date  . COLONOSCOPY    . NASAL SEPTUM SURGERY      Social History   Socioeconomic History  . Marital status: Married    Spouse name: Not on file  . Number of children: 2  . Years of education: BS  . Highest education level: Not on file  Occupational History  . Occupation: Software engineer: [employer]  Social Needs  . Financial resource strain: Not on file  . Food insecurity:    Worry: Not on file    Inability: Not on file  . Transportation needs:    Medical: Not on file    Non-medical: Not on file  Tobacco Use  . Smoking status: Former Smoker    Packs/day: 0.50    Years: 30.00    Pack years: 15.00    Last attempt to quit: 11/05/2012    Years since quitting: 5.4  . Smokeless tobacco: Never Used  . Tobacco comment: 30 years at most off and on smoking 03/14/15  Substance  and Sexual Activity  . Alcohol use: Yes    Alcohol/week: 0.0 standard drinks    Comment: 2 drinks per day  . Drug use: Yes    Comment: Occasional Marijuana use  . Sexual activity: Not on file  Lifestyle  . Physical activity:    Days per week: Not on file    Minutes per session: Not on file  . Stress: Not on file  Relationships  . Social connections:    Talks on phone: Not on file    Gets together: Not on file    Attends religious service: Not on file    Active member of club or organization:  Not on file    Attends meetings of clubs or organizations: Not on file    Relationship status: Not on file  . Intimate partner violence:    Fear of current or ex partner: Not on file    Emotionally abused: Not on file    Physically abused: Not on file    Forced sexual activity: Not on file  Other Topics Concern  . Not on file  Social History Narrative   Originally from [place]. Has lived in [place], [place], [place], & [place] since 1993. 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 Currently has a dog & cats. Previously had a parrot (conure) 12 years ago for 1 year but in same house.  Has also had excessive exposure to pigeon feces. He has had very brief exposure to asbestos around 2000. No hot tub exposure. He has inhaled chemical fumes/gases/refridgerants through his work.      Marital status: married x 26 years      Children: 2 children (19, 7); no grandchildren      Lives: Lives at home with his wife      Employment: [employer]      Tobacco: none; quit in 2013; smoked x 1/2 ppd x 20 years      Alcohol:  10 drinks; usually 2 drinks per day and then skip two days.      Drugs:  None      Exercise: none; job is physically demanding      Right-handed.   1 cup caffeine daily.    Family History  Problem Relation Age of Onset  . Breast cancer Mother        Breast Cancer  . Parkinson's disease Mother   . Heart disease Father        CABG,  stenting cardiac  . Cancer Brother 68       prostate cancer  . Lung disease Neg Hx   . Autoimmune disease Neg Hx      Review of Systems  Constitutional: Positive for weight loss. Negative for chills and fever.  HENT: Negative.  Negative for congestion, nosebleeds and sore throat.   Eyes: Negative.  Negative for blurred vision and double vision.  Respiratory: Negative.  Negative for cough, hemoptysis, shortness of breath and wheezing.   Cardiovascular: Negative.  Negative for chest pain and palpitations.  Gastrointestinal: Negative.  Negative for abdominal pain, blood in stool, diarrhea, melena, nausea and vomiting.  Musculoskeletal: Positive for joint pain.  Skin: Negative.  Negative for rash.  Neurological: Negative.  Negative for dizziness, focal weakness and headaches.  Endo/Heme/Allergies: Negative.   All other systems reviewed and are negative.   Vitals:   04/28/18 1515  BP: 129/73  Pulse: 76  Resp: 18  Temp: 98.4 F (36.9 C)  SpO2: 93%    Physical Exam  Constitutional: He is oriented to person, place, and time. He appears well-developed and well-nourished.  HENT:  Head: Normocephalic and atraumatic.  Eyes: Pupils are equal, round, and reactive to light.  Neck: Normal range of motion.  Cardiovascular: Normal rate.  Pulmonary/Chest: Effort normal.  Musculoskeletal: Normal range of motion.  Neurological: He is alert and oriented to person, place, and time.  Skin: Skin is warm and dry. Capillary refill takes less than 2 seconds.  Psychiatric: He has a normal mood and affect. His behavior is normal.  Vitals reviewed.    ASSESSMENT & PLAN: Leelyn was  seen today for establish care and arthritis.  Diagnoses and all orders for this visit:  Type 2 diabetes mellitus with diabetic polyneuropathy, with long-term current use of insulin (HCC)  Morbid obesity (HCC)  Essential hypertension  Dyslipidemia  Chronic pain of both knees    Patient Instructions        If you have lab work done today you will be contacted with your lab results within the next 2 weeks.  If you have not heard from [place] then please contact us. The fastest way to get your results is to register for My Chart.   IF you received an x-ray today, you will receive an invoice from [facility]. Please contact [facility] at [phone] with questions or concerns regarding your invoice.   IF you received labwork today, you will receive an invoice from [place] [facility]. Please contact [facility] at [phone] with questions or concerns regarding your invoice.   Our billing staff will not be able to assist you with questions regarding bills from these companies.  You will be contacted with the lab results as soon as they are available. The fastest way to get your results is to activate your My Chart account. Instructions are located on the last page of this paperwork. If you have not heard from [place] regarding the results in 2 weeks, please contact this office.     Health Maintenance, Male A healthy lifestyle and preventive care is important for your health and wellness. Ask your health care provider about what schedule of regular examinations is right for you. What should I know about weight and diet? Eat a Healthy Diet  Eat plenty of vegetables, fruits, whole grains, low-fat dairy products, and lean protein.  Do not eat a lot of foods high in solid fats, added sugars, or salt.  Maintain a Healthy Weight Regular exercise can help you achieve or maintain a healthy weight. You should:  Do at least 150 minutes of exercise each week. The exercise should increase your heart rate and make you sweat (moderate-intensity exercise).  Do strength-training exercises at least twice a week.  Watch Your Levels of Cholesterol and Blood Lipids  Have your blood tested for lipids and cholesterol every 5 years starting at 57 years of age. If you are at high risk for heart disease,  you should start having your blood tested when you are 57 years old. You may need to have your cholesterol levels checked more often if: ? Your lipid or cholesterol levels are high. ? You are older than 57 years of age. ? You are at high risk for heart disease.  What should I know about cancer screening? Many types of cancers can be detected early and may often be prevented. Lung Cancer  You should be screened every year for lung cancer if: ? You are a current smoker who has smoked for at least 30 years. ? You are a former smoker who has quit within the past 15 years.  Talk to your health care provider about your screening options, when you should start screening, and how often you should be screened.  Colorectal Cancer  Routine colorectal cancer screening usually begins at 57 years of age and should be repeated every 5-10 years until you are 57 years old. You may need to be screened more often if early forms of precancerous polyps or small growths are found. Your health care provider may recommend screening at an earlier age if you have risk factors for colon cancer.  Your  health care provider may recommend using home test kits to check for hidden blood in the stool.  A small camera at the end of a tube can be used to examine your colon (sigmoidoscopy or colonoscopy). This checks for the earliest forms of colorectal cancer.  Prostate and Testicular Cancer  Depending on your age and overall health, your health care provider may do certain tests to screen for prostate and testicular cancer.  Talk to your health care provider about any symptoms or concerns you have about testicular or prostate cancer.  Skin Cancer  Check your skin from head to toe regularly.  Tell your health care provider about any new moles or changes in moles, especially if: ? There is a change in a mole's size, shape, or color. ? You have a mole that is larger than a pencil eraser.  Always use sunscreen. Apply  sunscreen liberally and repeat throughout the day.  Protect yourself by wearing long sleeves, pants, a wide-brimmed hat, and sunglasses when outside.  What should I know about heart disease, diabetes, and high blood pressure?  If you are 56-57 years of age, have your blood pressure checked every 3-5 years. If you are 55 years of age or older, have your blood pressure checked every year. You should have your blood pressure measured twice-once when you are at a hospital or clinic, and once when you are not at a hospital or clinic. Record the average of the two measurements. To check your blood pressure when you are not at a hospital or clinic, you can use: ? An automated blood pressure machine at a pharmacy. ? A home blood pressure monitor.  Talk to your health care provider about your target blood pressure.  If you are between 50-34 years old, ask your health care provider if you should take aspirin to prevent heart disease.  Have regular diabetes screenings by checking your fasting blood sugar level. ? If you are at a normal weight and have a low risk for diabetes, have this test once every three years after the age of 23. ? If you are overweight and have a high risk for diabetes, consider being tested at a younger age or more often.  A one-time screening for abdominal aortic aneurysm (AAA) by ultrasound is recommended for men aged 65-75 years who are current or former smokers. What should I know about preventing infection? Hepatitis B If you have a higher risk for hepatitis B, you should be screened for this virus. Talk with your health care provider to find out if you are at risk for hepatitis B infection. Hepatitis C Blood testing is recommended for:  Everyone born from [birth year] through 1965.  Anyone with known risk factors for hepatitis C.  Sexually Transmitted Diseases (STDs)  You should be screened each year for STDs including gonorrhea and chlamydia if: ? You are sexually active  and are younger than 57 years of age. ? You are older than 57 years of age and your health care provider tells you that you are at risk for this type of infection. ? Your sexual activity has changed since you were last screened and you are at an increased risk for chlamydia or gonorrhea. Ask your health care provider if you are at risk.  Talk with your health care provider about whether you are at high risk of being infected with HIV. Your health care provider may recommend a prescription medicine to help prevent HIV infection.  What else can I do?  Schedule regular health, dental, and eye exams.  Stay current with your vaccines (immunizations).  Do not use any tobacco products, such as cigarettes, chewing tobacco, and e-cigarettes. If you need help quitting, ask your health care provider.  Limit alcohol intake to no more than 2 drinks per day. One drink equals 12 ounces of beer, 5 ounces of wine, or 1 ounces of hard liquor.  Do not use street drugs.  Do not share needles.  Ask your health care provider for help if you need support or information about quitting drugs.  Tell your health care provider if you often feel depressed.  Tell your health care provider if you have ever been abused or do not feel safe at home. This information is not intended to replace advice given to you by your health care provider. Make sure you discuss any questions you have with your health care provider. Document Released: 01/29/2008 Document Revised: 03/31/2016 Document Reviewed: 05/06/2015 Elsevier Interactive Patient Education  2018 [employer].  Hypertension Hypertension, commonly called high blood pressure, is when the force of blood pumping through the arteries is too strong. The arteries are the blood vessels that carry blood from the heart throughout the body. Hypertension forces the heart to work harder to pump blood and may cause arteries to become narrow or stiff. Having untreated or  uncontrolled hypertension can cause heart attacks, strokes, kidney disease, and other problems. A blood pressure reading consists of a higher number over a lower number. Ideally, your blood pressure should be below 120/80. The first ("top") number is called the systolic pressure. It is a measure of the pressure in your arteries as your heart beats. The second ("bottom") number is called the diastolic pressure. It is a measure of the pressure in your arteries as the heart relaxes. What are the causes? The cause of this condition is not known. What increases the risk? Some risk factors for high blood pressure are under your control. Others are not. Factors you can change  Smoking.  Having type 2 diabetes mellitus, high cholesterol, or both.  Not getting enough exercise or physical activity.  Being overweight.  Having too much fat, sugar, calories, or salt (sodium) in your diet.  Drinking too much alcohol. Factors that are difficult or impossible to change  Having chronic kidney disease.  Having a family history of high blood pressure.  Age. Risk increases with age.  Race. You may be at higher risk if you are African-American.  Gender. Men are at higher risk than women before age 71. After age 56, women are at higher risk than men.  Having obstructive sleep apnea.  Stress. What are the signs or symptoms? Extremely high blood pressure (hypertensive crisis) may cause:  Headache.  Anxiety.  Shortness of breath.  Nosebleed.  Nausea and vomiting.  Severe chest pain.  Jerky movements you cannot control (seizures).  How is this diagnosed? This condition is diagnosed by measuring your blood pressure while you are seated, with your arm resting on a surface. The cuff of the blood pressure monitor will be placed directly against the skin of your upper arm at the level of your heart. It should be measured at least twice using the same arm. Certain conditions can cause a  difference in blood pressure between your right and left arms. Certain factors can cause blood pressure readings to be lower or higher than normal (elevated) for a short period of time:  When your blood pressure is higher when you are in  a health care provider's office than when you are at home, this is called white coat hypertension. Most people with this condition do not need medicines.  When your blood pressure is higher at home than when you are in a health care provider's office, this is called masked hypertension. Most people with this condition may need medicines to control blood pressure.  If you have a high blood pressure reading during one visit or you have normal blood pressure with other risk factors:  You may be asked to return on a different day to have your blood pressure checked again.  You may be asked to monitor your blood pressure at home for 1 week or longer.  If you are diagnosed with hypertension, you may have other blood or imaging tests to help your health care provider understand your overall risk for other conditions. How is this treated? This condition is treated by making healthy lifestyle changes, such as eating healthy foods, exercising more, and reducing your alcohol intake. Your health care provider may prescribe medicine if lifestyle changes are not enough to get your blood pressure under control, and if:  Your systolic blood pressure is above 130.  Your diastolic blood pressure is above 80.  Your personal target blood pressure may vary depending on your medical conditions, your age, and other factors. Follow these instructions at home: Eating and drinking  Eat a diet that is high in fiber and potassium, and low in sodium, added sugar, and fat. An example eating plan is called the DASH (Dietary Approaches to Stop Hypertension) diet. To eat this way: ? Eat plenty of fresh fruits and vegetables. Try to fill half of your plate at each meal with fruits and  vegetables. ? Eat whole grains, such as whole wheat pasta, brown rice, or whole grain bread. Fill about one quarter of your plate with whole grains. ? Eat or drink low-fat dairy products, such as skim milk or low-fat yogurt. ? Avoid fatty cuts of meat, processed or cured meats, and poultry with skin. Fill about one quarter of your plate with lean proteins, such as fish, chicken without skin, beans, eggs, and tofu. ? Avoid premade and processed foods. These tend to be higher in sodium, added sugar, and fat.  Reduce your daily sodium intake. Most people with hypertension should eat less than 1,500 mg of sodium a day.  Limit alcohol intake to no more than 1 drink a day for nonpregnant women and 2 drinks a day for men. One drink equals 12 oz of beer, 5 oz of wine, or 1 oz of hard liquor. Lifestyle  Work with your health care provider to maintain a healthy body weight or to lose weight. Ask what an ideal weight is for you.  Get at least 30 minutes of exercise that causes your heart to beat faster (aerobic exercise) most days of the week. Activities may include walking, swimming, or biking.  Include exercise to strengthen your muscles (resistance exercise), such as pilates or lifting weights, as part of your weekly exercise routine. Try to do these types of exercises for 30 minutes at least 3 days a week.  Do not use any products that contain nicotine or tobacco, such as cigarettes and e-cigarettes. If you need help quitting, ask your health care provider.  Monitor your blood pressure at home as told by your health care provider.  Keep all follow-up visits as told by your health care provider. This is important. Medicines  Take over-the-counter and prescription medicines  only as told by your health care provider. Follow directions carefully. Blood pressure medicines must be taken as prescribed.  Do not skip doses of blood pressure medicine. Doing this puts you at risk for problems and can make  the medicine less effective.  Ask your health care provider about side effects or reactions to medicines that you should watch for. Contact a health care provider if:  You think you are having a reaction to a medicine you are taking.  You have headaches that keep coming back (recurring).  You feel dizzy.  You have swelling in your ankles.  You have trouble with your vision. Get help right away if:  You develop a severe headache or confusion.  You have unusual weakness or numbness.  You feel faint.  You have severe pain in your chest or abdomen.  You vomit repeatedly.  You have trouble breathing. Summary  Hypertension is when the force of blood pumping through your arteries is too strong. If this condition is not controlled, it may put you at risk for serious complications.  Your personal target blood pressure may vary depending on your medical conditions, your age, and other factors. For most people, a normal blood pressure is less than 120/80.  Hypertension is treated with lifestyle changes, medicines, or a combination of both. Lifestyle changes include weight loss, eating a healthy, low-sodium diet, exercising more, and limiting alcohol. This information is not intended to replace advice given to you by your health care provider. Make sure you discuss any questions you have with your health care provider. Document Released: 08/02/2005 Document Revised: 06/30/2016 Document Reviewed: 06/30/2016 Elsevier Interactive Patient Education  2018 [employer].  Diabetes Mellitus and Nutrition When you have diabetes (diabetes mellitus), it is very important to have healthy eating habits because your blood sugar (glucose) levels are greatly affected by what you eat and drink. Eating healthy foods in the appropriate amounts, at about the same times every day, can help you:  Control your blood glucose.  Lower your risk of heart disease.  Improve your blood pressure.  Reach or  maintain a healthy weight.  Every person with diabetes is different, and each person has different needs for a meal plan. Your health care provider may recommend that you work with a diet and nutrition specialist (dietitian) to make a meal plan that is best for you. Your meal plan may vary depending on factors such as:  The calories you need.  The medicines you take.  Your weight.  Your blood glucose, blood pressure, and cholesterol levels.  Your activity level.  Other health conditions you have, such as heart or kidney disease.  How do carbohydrates affect me? Carbohydrates affect your blood glucose level more than any other type of food. Eating carbohydrates naturally increases the amount of glucose in your blood. Carbohydrate counting is a method for keeping track of how many carbohydrates you eat. Counting carbohydrates is important to keep your blood glucose at a healthy level, especially if you use insulin or take certain oral diabetes medicines. It is important to know how many carbohydrates you can safely have in each meal. This is different for every person. Your dietitian can help you calculate how many carbohydrates you should have at each meal and for snack. Foods that contain carbohydrates include:  Bread, cereal, rice, pasta, and crackers.  Potatoes and corn.  Peas, beans, and lentils.  Milk and yogurt.  Fruit and juice.  Desserts, such as cakes, cookies, ice cream, and candy.  How does alcohol affect me? Alcohol can cause a sudden decrease in blood glucose (hypoglycemia), especially if you use insulin or take certain oral diabetes medicines. Hypoglycemia can be a life-threatening condition. Symptoms of hypoglycemia (sleepiness, dizziness, and confusion) are similar to symptoms of having too much alcohol. If your health care provider says that alcohol is safe for you, follow these guidelines:  Limit alcohol intake to no more than 1 drink per day for nonpregnant  women and 2 drinks per day for men. One drink equals 12 oz of beer, 5 oz of wine, or 1 oz of hard liquor.  Do not drink on an empty stomach.  Keep yourself hydrated with water, diet soda, or unsweetened iced tea.  Keep in mind that regular soda, juice, and other mixers may contain a lot of sugar and must be counted as carbohydrates.  What are tips for following this plan? Reading food labels  Start by checking the serving size on the label. The amount of calories, carbohydrates, fats, and other nutrients listed on the label are based on one serving of the food. Many foods contain more than one serving per package.  Check the total grams (g) of carbohydrates in one serving. You can calculate the number of servings of carbohydrates in one serving by dividing the total carbohydrates by 15. For example, if a food has 30 g of total carbohydrates, it would be equal to 2 servings of carbohydrates.  Check the number of grams (g) of saturated and trans fats in one serving. Choose foods that have low or no amount of these fats.  Check the number of milligrams (mg) of sodium in one serving. Most people should limit total sodium intake to less than 2,300 mg per day.  Always check the nutrition information of foods labeled as "low-fat" or "nonfat". These foods may be higher in added sugar or refined carbohydrates and should be avoided.  Talk to your dietitian to identify your daily goals for nutrients listed on the label. Shopping  Avoid buying canned, premade, or processed foods. These foods tend to be high in fat, sodium, and added sugar.  Shop around the outside edge of the grocery store. This includes fresh fruits and vegetables, bulk grains, fresh meats, and fresh dairy. Cooking  Use low-heat cooking methods, such as baking, instead of high-heat cooking methods like deep frying.  Cook using healthy oils, such as olive, canola, or sunflower oil.  Avoid cooking with butter, cream, or high-fat  meats. Meal planning  Eat meals and snacks regularly, preferably at the same times every day. Avoid going long periods of time without eating.  Eat foods high in fiber, such as fresh fruits, vegetables, beans, and whole grains. Talk to your dietitian about how many servings of carbohydrates you can eat at each meal.  Eat 4-6 ounces of lean protein each day, such as lean meat, chicken, fish, eggs, or tofu. 1 ounce is equal to 1 ounce of meat, chicken, or fish, 1 egg, or 1/4 cup of tofu.  Eat some foods each day that contain healthy fats, such as avocado, nuts, seeds, and fish. Lifestyle   Check your blood glucose regularly.  Exercise at least 30 minutes 5 or more days each week, or as told by your health care provider.  Take medicines as told by your health care provider.  Do not use any products that contain nicotine or tobacco, such as cigarettes and e-cigarettes. If you need help quitting, ask your health care provider.  Work  with a counselor or diabetes educator to identify strategies to manage stress and any emotional and social challenges. What are some questions to ask my health care provider?  Do I need to meet with a diabetes educator?  Do I need to meet with a dietitian?  What number can I call if I have questions?  When are the best times to check my blood glucose? Where to find more information:  American Diabetes Association: diabetes.org/food-and-fitness/food  Academy of Nutrition and Dietetics: https://www.vargas.com/  [employer] of Diabetes and Digestive and Kidney Diseases (NIH): FindJewelers.cz Summary  A healthy meal plan will help you control your blood glucose and maintain a healthy lifestyle.  Working with a diet and nutrition specialist (dietitian) can help you make a meal plan that is best for you.  Keep in mind that carbohydrates  and alcohol have immediate effects on your blood glucose levels. It is important to count carbohydrates and to use alcohol carefully. This information is not intended to replace advice given to you by your health care provider. Make sure you discuss any questions you have with your health care provider. Document Released: 04/29/2005 Document Revised: 09/06/2016 Document Reviewed: 09/06/2016 Elsevier Interactive Patient Education  2018 [employer].      Edwina Barth, MD [facility]

## 2018-05-24 IMAGING — DX DG CHEST 2V
4 series · 4 of 4 positions shown · non-contrast
Comparison: 03/05/2015

CLINICAL DATA: Productive cough

EXAM:
CHEST - 2 VIEW

[chest pa (1 of 2)]
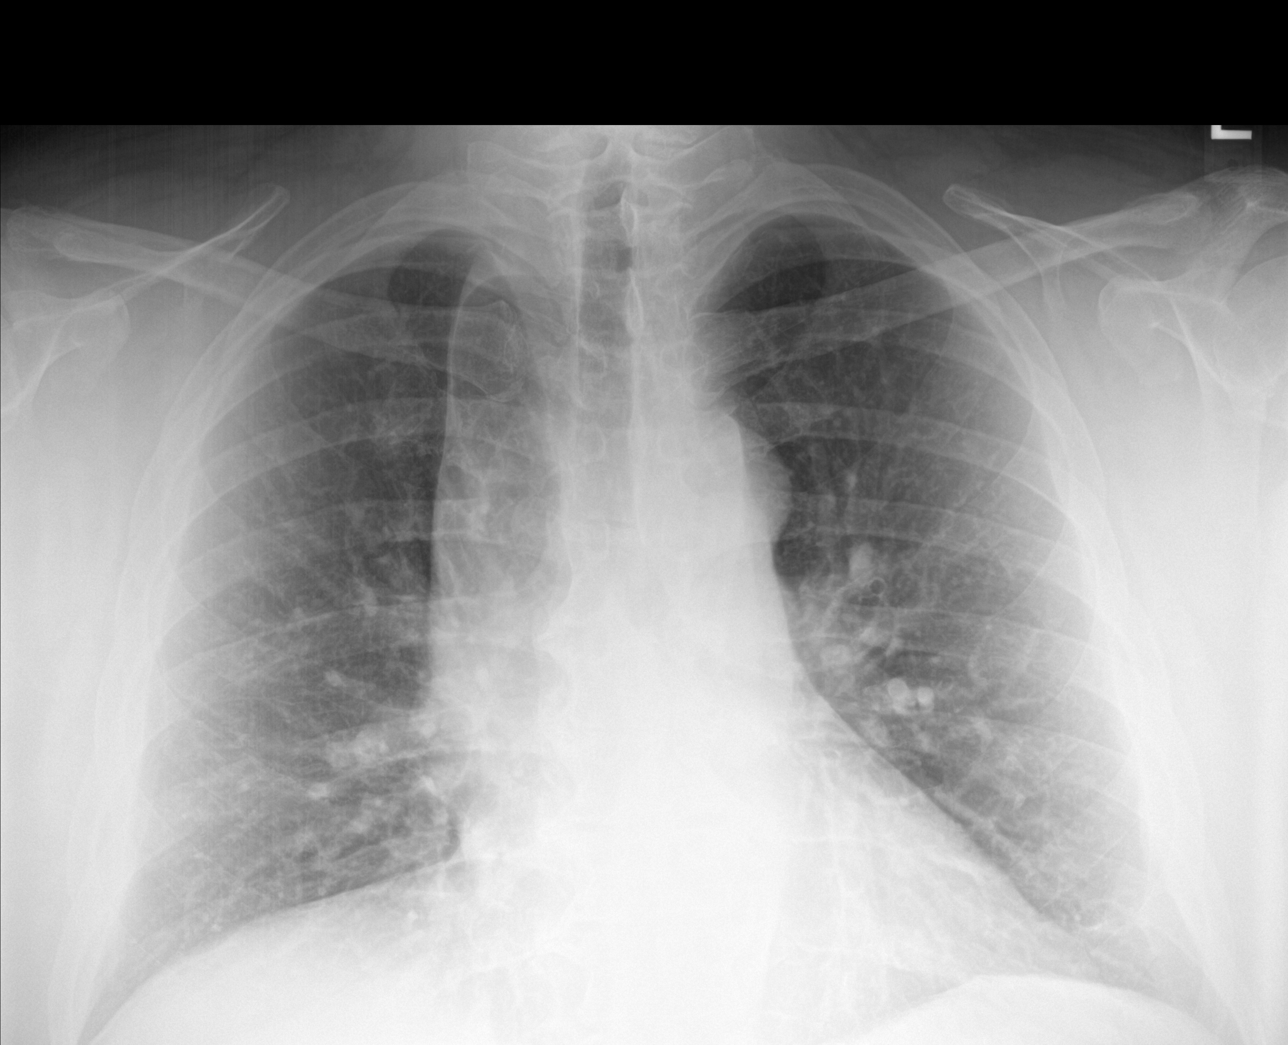

[chest lat (1 of 2)]
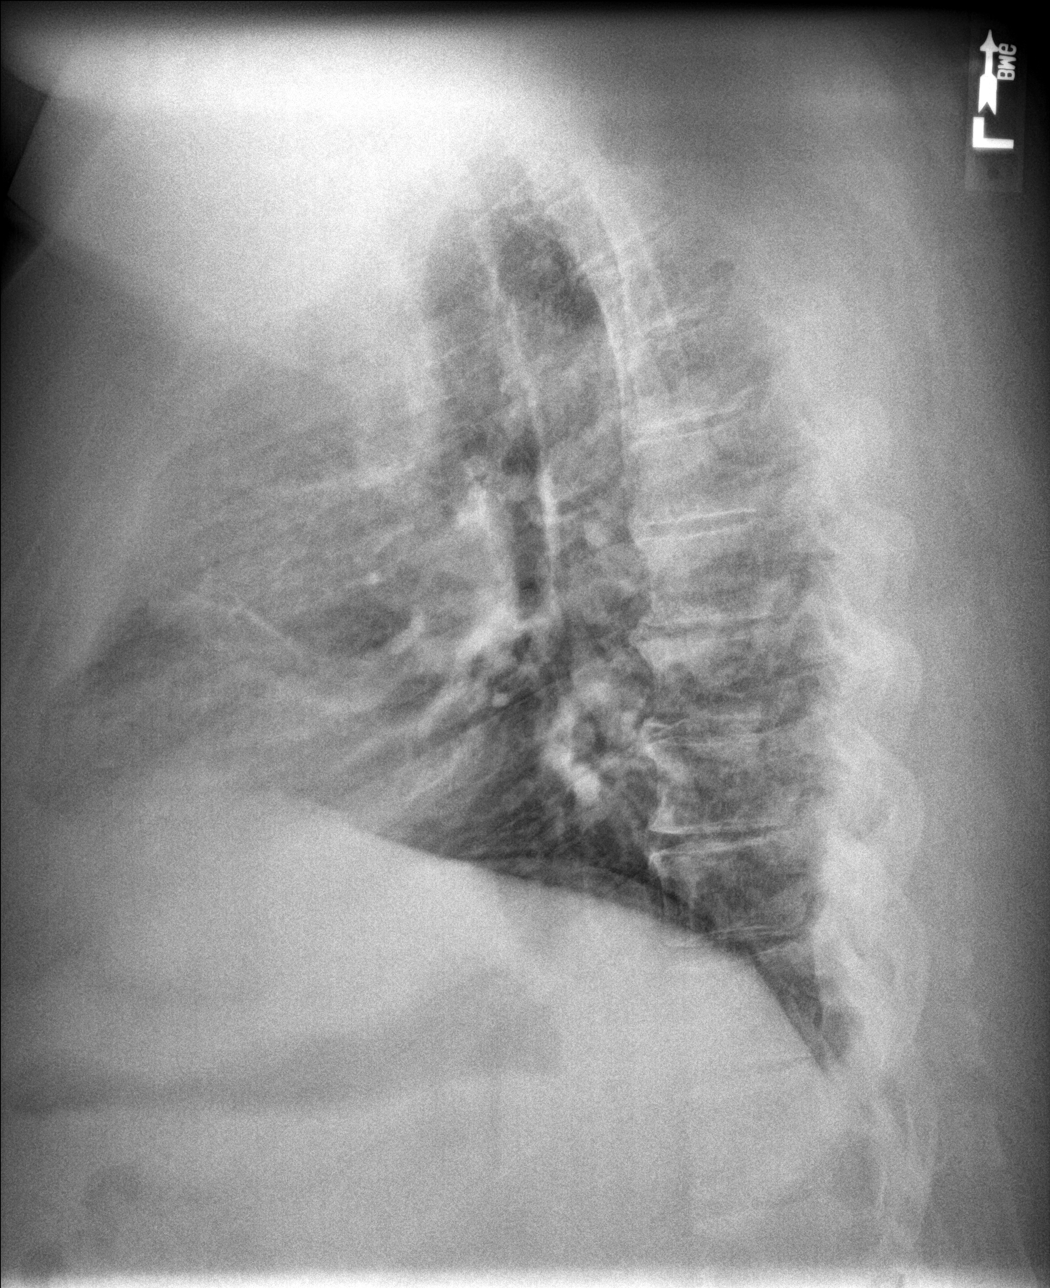

[chest pa (2 of 2)]
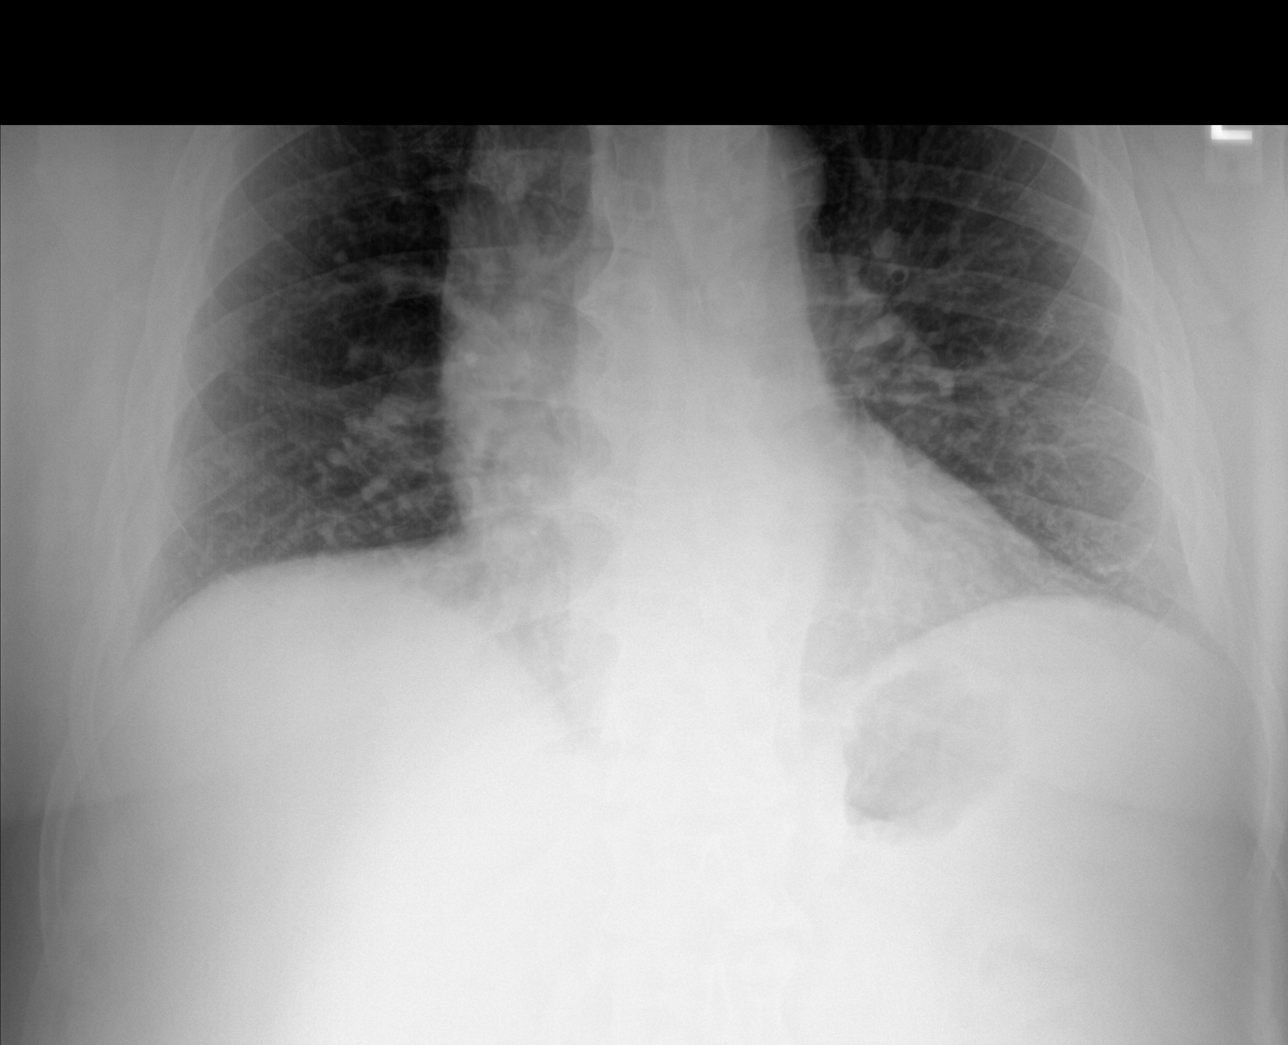

[chest lat (2 of 2)]
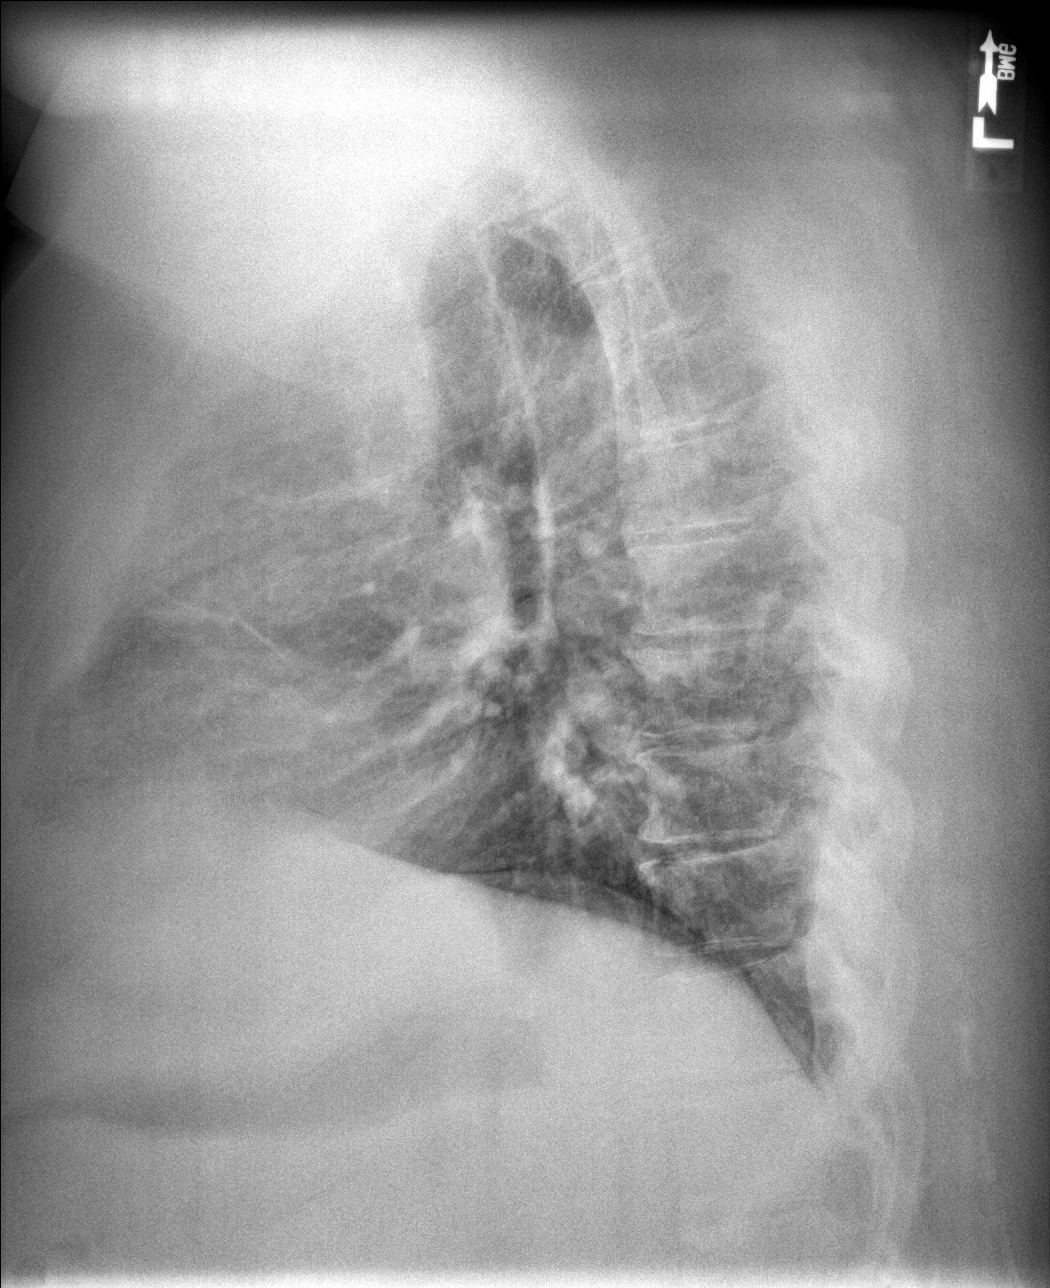

[4 of 4 positions shown; findings below may reference images not displayed]

FINDINGS: Cardiac and mediastinal contours normal. Azygos lobe fissure noted.
Lungs are clear without infiltrate or effusion. Negative for heart
failure.
IMPRESSION: No active cardiopulmonary disease.

## 2018-05-29 ENCOUNTER — Other Ambulatory Visit: Payer: Self-pay | Admitting: Emergency Medicine

## 2018-05-29 DIAGNOSIS — G6289 Other specified polyneuropathies: Secondary | ICD-10-CM

## 2018-05-30 NOTE — Telephone Encounter (Signed)
Spoke with patient; states request was in error. Has medication and refills

## 2018-07-03 ENCOUNTER — Other Ambulatory Visit: Payer: Self-pay | Admitting: Emergency Medicine

## 2018-07-03 DIAGNOSIS — G6289 Other specified polyneuropathies: Secondary | ICD-10-CM

## 2018-07-06 ENCOUNTER — Ambulatory Visit: Payer: BC Managed Care – PPO | Admitting: Emergency Medicine

## 2018-07-06 ENCOUNTER — Encounter: Payer: Self-pay | Admitting: Emergency Medicine

## 2018-07-06 ENCOUNTER — Other Ambulatory Visit: Payer: Self-pay | Admitting: Emergency Medicine

## 2018-07-06 ENCOUNTER — Other Ambulatory Visit: Payer: Self-pay

## 2018-07-06 VITALS — BP 134/72 | HR 76 | Temp 98.4°F | Resp 16 | Ht 75.0 in | Wt 378.8 lb

## 2018-07-06 DIAGNOSIS — L600 Ingrowing nail: Secondary | ICD-10-CM

## 2018-07-06 DIAGNOSIS — G6289 Other specified polyneuropathies: Secondary | ICD-10-CM

## 2018-07-06 DIAGNOSIS — G4700 Insomnia, unspecified: Secondary | ICD-10-CM

## 2018-07-06 MED ORDER — HYDROXYZINE HCL 50 MG PO TABS: 50.0000 mg | ORAL_TABLET | Freq: Every evening | ORAL | 2 refills | Status: DC | PRN

## 2018-07-06 MED ORDER — CEPHALEXIN 500 MG PO CAPS: 500.0000 mg | ORAL_CAPSULE | Freq: Three times a day (TID) | ORAL | 0 refills | Status: AC

## 2018-07-06 NOTE — Patient Instructions (Addendum)
   If you have lab work done today you will be contacted with your lab results within the next 2 weeks.  If you have not heard from us then please contact us. The fastest way to get your results is to register for My Chart.   IF you received an x-ray today, you will receive an invoice from [facility]. Please contact  Radiology at [phone] with questions or concerns regarding your invoice.   IF you received labwork today, you will receive an invoice from [facility]. Please contact [facility] at [phone] with questions or concerns regarding your invoice.   Our billing staff will not be able to assist you with questions regarding bills from these companies.  You will be contacted with the lab results as soon as they are available. The fastest way to get your results is to activate your My Chart account. Instructions are located on the last page of this paperwork. If you have not heard from us regarding the results in 2 weeks, please contact this office.    Insomnia Insomnia is a sleep disorder that makes it difficult to fall asleep or to stay asleep. Insomnia can cause tiredness (fatigue), low energy, difficulty concentrating, mood swings, and poor performance at work or school. There are three different ways to classify insomnia:  Difficulty falling asleep.  Difficulty staying asleep.  Waking up too early in the morning.  Any type of insomnia can be long-term (chronic) or short-term (acute). Both are common. Short-term insomnia usually lasts for three months or less. Chronic insomnia occurs at least three times a week for longer than three months. What are the causes? Insomnia may be caused by another condition, situation, or substance, such as:  Anxiety.  Certain medicines.  Gastroesophageal reflux disease (GERD) or other gastrointestinal conditions.  Asthma or other breathing conditions.  Restless legs syndrome, sleep apnea, or other sleep  disorders.  Chronic pain.  Menopause. This may include hot flashes.  Stroke.  Abuse of alcohol, tobacco, or illegal drugs.  Depression.  Caffeine.  Neurological disorders, such as Alzheimer disease.  An overactive thyroid (hyperthyroidism).  The cause of insomnia may not be known. What increases the risk? Risk factors for insomnia include:  Gender. Women are more commonly affected than men.  Age. Insomnia is more common as you get older.  Stress. This may involve your professional or personal life.  Income. Insomnia is more common in people with lower income.  Lack of exercise.  Irregular work schedule or night shifts.  Traveling between different time zones.  What are the signs or symptoms? If you have insomnia, trouble falling asleep or trouble staying asleep is the main symptom. This may lead to other symptoms, such as:  Feeling fatigued.  Feeling nervous about going to sleep.  Not feeling rested in the morning.  Having trouble concentrating.  Feeling irritable, anxious, or depressed.  How is this treated? Treatment for insomnia depends on the cause. If your insomnia is caused by an underlying condition, treatment will focus on addressing the condition. Treatment may also include:  Medicines to help you sleep.  Counseling or therapy.  Lifestyle adjustments.  Follow these instructions at home:  Take medicines only as directed by your health care provider.  Keep regular sleeping and waking hours. Avoid naps.  Keep a sleep diary to help you and your health care provider figure out what could be causing your insomnia. Include: ? When you sleep. ? When you wake up during the night. ? How   well you sleep. ? How rested you feel the next day. ? Any side effects of medicines you are taking. ? What you eat and drink.  Make your bedroom a comfortable place where it is easy to fall asleep: ? Put up shades or special blackout curtains to block light from  outside. ? Use a white noise machine to block noise. ? Keep the temperature cool.  Exercise regularly as directed by your health care provider. Avoid exercising right before bedtime.  Use relaxation techniques to manage stress. Ask your health care provider to suggest some techniques that may work well for you. These may include: ? Breathing exercises. ? Routines to release muscle tension. ? Visualizing peaceful scenes.  Cut back on alcohol, caffeinated beverages, and cigarettes, especially close to bedtime. These can disrupt your sleep.  Do not overeat or eat spicy foods right before bedtime. This can lead to digestive discomfort that can make it hard for you to sleep.  Limit screen use before bedtime. This includes: ? Watching TV. ? Using your smartphone, tablet, and computer.  Stick to a routine. This can help you fall asleep faster. Try to do a quiet activity, brush your teeth, and go to bed at the same time each night.  Get out of bed if you are still awake after 15 minutes of trying to sleep. Keep the lights down, but try reading or doing a quiet activity. When you feel sleepy, go back to bed.  Make sure that you drive carefully. Avoid driving if you feel very sleepy.  Keep all follow-up appointments as directed by your health care provider. This is important. Contact a health care provider if:  You are tired throughout the day or have trouble in your daily routine due to sleepiness.  You continue to have sleep problems or your sleep problems get worse. Get help right away if:  You have serious thoughts about hurting yourself or someone else. This information is not intended to replace advice given to you by your health care provider. Make sure you discuss any questions you have with your health care provider. Document Released: 07/30/2000 Document Revised: 01/02/2016 Document Reviewed: 05/03/2014 Elsevier Interactive Patient Education  2018 [employer] Inc[employer] Ingrown  Toenail An ingrown toenail occurs when the corner or sides of your toenail grow into the surrounding skin. The big toe is most commonly affected, but it can happen to any of your toes. If your ingrown toenail is not treated, you will be at risk for infection. What are the causes? This condition may be caused by:  Wearing shoes that are too small or tight.  Injury or trauma, such as stubbing your toe or having your toe stepped on.  Improper cutting or care of your toenails.  Being born with (congenital) nail or foot abnormalities, such as having a nail that is too big for your toe.  What increases the risk? Risk factors for an ingrown toenail include:  Age. Your nails tend to thicken as you get older, so ingrown nails are more common in older people.  Diabetes.  Cutting your toenails incorrectly.  Blood circulation problems.  What are the signs or symptoms? Symptoms may include:  Pain, soreness, or tenderness.  Redness.  Swelling.  Hardening of the skin surrounding the toe.  Your ingrown toenail may be infected if there is fluid, pus, or drainage. How is this diagnosed? An ingrown toenail may be diagnosed by medical history and physical exam. If your toenail is infected, your health care provider  may test a sample of the drainage. How is this treated? Treatment depends on the severity of your ingrown toenail. Some ingrown toenails may be treated at home. More severe or infected ingrown toenails may require surgery to remove all or part of the nail. Infected ingrown toenails may also be treated with antibiotic medicines. Follow these instructions at home:  If you were prescribed an antibiotic medicine, finish all of it even if you start to feel better.  Soak your foot in warm soapy water for 20 minutes, 3 times per day or as directed by your health care provider.  Carefully lift the edge of the nail away from the sore skin by wedging a small piece of cotton under the  corner of the nail. This may help with the pain. Be careful not to cause more injury to the area.  Wear shoes that fit well. If your ingrown toenail is causing you pain, try wearing sandals, if possible.  Trim your toenails regularly and carefully. Do not cut them in a curved shape. Cut your toenails straight across. This prevents injury to the skin at the corners of the toenail.  Keep your feet clean and dry.  If you are having trouble walking and are given crutches by your health care provider, use them as directed.  Do not pick at your toenail or try to remove it yourself.  Take medicines only as directed by your health care provider.  Keep all follow-up visits as directed by your health care provider. This is important. Contact a health care provider if:  Your symptoms do not improve with treatment. Get help right away if:  You have red streaks that start at your foot and go up your leg.  You have a fever.  You have increased redness, swelling, or pain.  You have fluid, blood, or pus coming from your toenail. This information is not intended to replace advice given to you by your health care provider. Make sure you discuss any questions you have with your health care provider. Document Released: 07/30/2000 Document Revised: 01/02/2016 Document Reviewed: 06/26/2014 Elsevier Interactive Patient Education  [employer]2018 Elsevier Inc.

## 2018-07-06 NOTE — Telephone Encounter (Signed)
Requested medication (s) are due for refill today: yes  Requested medication (s) are on the active medication list: yes  Last refill:  04/25/18 #180  Future visit scheduled: no  Notes to clinic:  Unable to refill per protocol    Requested Prescriptions  Pending Prescriptions Disp Refills   topiramate (TOPAMAX) 50 MG tablet [Pharmacy Med Name: TOPIRAMATE 50 MG TABLET] 180 tablet 0    Sig: Take 2 tablets (100 mg total) by mouth at bedtime.     Not Delegated - Neurology: Anticonvulsants - topiramate & zonisamide Failed - 07/06/2018  7:18 PM      Failed - This refill cannot be delegated      Passed - Cr in normal range and within 360 days    Creat  Date Value Ref Range Status  09/01/2015 0.62 (L) 0.70 - 1.33 mg/dL Final   Creatinine, Ser  Date Value Ref Range Status  11/16/2017 0.76 0.76 - 1.27 mg/dL Final         Passed - C02 in normal range and within 360 days    CO2  Date Value Ref Range Status  11/16/2017 24 20 - 29 mmol/L Final         Passed - Valid encounter within last 12 months    Recent Outpatient Visits          Today Ingrown toenail of left foot   [facility] Eilleen KempfMiguel Jose, MD   2 months ago Type 2 diabetes mellitus with diabetic polyneuropathy, with long-term current use of insulin [facility](HCC)   [facility] [place] Jose, [place]   4 months ago Cellulitis of buttock   [facility], Asencion Partridge[place]rey R, MD   6 months ago Productive cough   [facility], [place]   7 months ago Acute pain of both knees   [facility], Myrle ShengKristi M, MD

## 2018-07-06 NOTE — Progress Notes (Signed)
Johnny Andrews 57 y.o.   Chief Complaint  Patient presents with  . Nail Problem    per patient hang nail on the LEFT Great toe x 2 months    HISTORY OF PRESENT ILLNESS: This is a 57 y.o. male complaining of ingrown toenail to the left great toe for 2 months.  Diabetic with no other symptoms Also complaining of insomnia secondary to stress.  Requesting some sleeping medication. HPI   Prior to Admission medications   Medication Sig Start Date End Date Taking? Authorizing Provider  ALPRAZolam Prudy Feeler) 0.5 MG tablet Take 1 tablet (0.5 mg total) by mouth at bedtime as needed for anxiety. 04/20/18  Yes Collie Siad A, MD  dapagliflozin propanediol (FARXIGA) 10 MG TABS tablet Take 20 mg by mouth daily.   Yes [provider]  diclofenac (VOLTAREN) 75 MG EC tablet TAKE 1 TABLET BY MOUTH TWICE A DAY 12/21/17  Yes Ethelda Chick, MD  diltiazem [facility]) 180 MG 24 hr capsule Take 180 mg by mouth daily. 02/10/17  Yes [provider]  insulin aspart (NOVOLOG FLEXPEN) 100 UNIT/ML FlexPen Inject 75 Units into the skin 3 (three) times daily with meals. 30 units am and 50 units pm   Yes [provider]  Levothyroxine Sodium 137 MCG CAPS levothyroxine 137 mcg capsule  Take 2 capsules every day by oral route.   Yes [provider]  lisinopril (PRINIVIL,ZESTRIL) 20 MG tablet Take 20 mg by mouth 2 (two) times daily. 02/23/18  Yes [provider]  metFORMIN (GLUCOPHAGE-XR) 500 MG 24 hr tablet Take 1,000 mg by mouth 2 (two) times daily.  09/15/15  Yes [provider]  simvastatin (ZOCOR) 20 MG tablet Take 20 mg by mouth daily.   Yes [provider]  TRESIBA FLEXTOUCH 200 UNIT/ML SOPN INJECT 100 UNITS TWICE A DAY 12/17/16  Yes [provider]  BD ULTRA-FINE PEN NEEDLES 29G X 12.7MM MISC USE TWO CHECK BLOOD SUGAR TWICE A DAY. APPOINTMENT NEEDED FOR FURTHER REFILLS 03/09/16   Reather Littler, MD  glucose blood (ONE TOUCH ULTRA TEST) test strip Two  times a day for ULTRA 2 ONE TOUCH 04/19/18   Georgina Quint, MD  predniSONE (DELTASONE) 20 MG tablet Take 1 tablet (20 mg total) by mouth daily with breakfast. Patient not taking: Reported on 07/06/2018 12/30/17   Wallis Bamberg, PA-C  Respiratory Therapy Supplies (ADULT MASK) MISC 2 Devices by Does not apply route once. 11/24/11   Oretha Milch, MD  topiramate (TOPAMAX) 50 MG tablet Take 2 tablets (100 mg total) by mouth at bedtime. 04/25/18   Georgina Quint, MD    No Known Allergies  Patient Active Problem List   Diagnosis Date Noted  . Family history of prostate cancer 03/06/2017  . Pure hypercholesterolemia 09/13/2016  . Spinal stenosis of lumbar region 11/03/2015  . Abnormality of gait 09/15/2015  . Restrictive lung disease 05/05/2015  . 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 Hypogonadism male 02/05/2013  . Elevated PSA 11/30/2011  . OTHER DISEASES OF LUNG NOT ELSEWHERE CLASSIFIED 07/20/2010  . GOUT 04/17/2010  . SMOKER 04/17/2010  . Sleep apnea 08/22/2009  . Alcohol abuse 10/24/2008  . Hypothyroidism 09/19/2007  . Diabetes (HCC) 09/19/2007  . Essential hypertension 09/19/2007  . OSTEOARTHRITIS 09/19/2007    Past Medical History:  Diagnosis Date  . ALCOHOL USE 10/24/2008  . ANXIETY 09/19/2007  . Cough 05/22/2008  . DIABETES MELLITUS, TYPE II 09/19/2007  . External thrombosed hemorrhoids 08/23/2008  . GOUT 04/17/2010  .  HYPERLIPIDEMIA 09/19/2007  . HYPERTENSION 09/19/2007  . HYPOTHYROIDISM 09/19/2007  . Leg pain   . OSTEOARTHRITIS 09/19/2007  . OTHER DISEASES OF LUNG NOT ELSEWHERE CLASSIFIED 07/20/2010  . Rash and other nonspecific skin eruption 09/19/2007  . Restrictive lung disease   . SCROTAL ABSCESS 05/27/2010  . Sebaceous cyst 08/01/2008  . SLEEP APNEA 08/22/2009  . SMOKER 04/17/2010    Past Surgical History:  Procedure Laterality Date  . COLONOSCOPY    . NASAL SEPTUM SURGERY      Social History   Socioeconomic History  . Marital status: Married    Spouse name:  Not on file  . Number of children: 2  . Years of education: BS  . Highest education level: Not on file  Occupational History  . Occupation: Software engineer: [employer]  Social Needs  . Financial resource strain: Not on file  . Food insecurity:    Worry: Not on file    Inability: Not on file  . Transportation needs:    Medical: Not on file    Non-medical: Not on file  Tobacco Use  . Smoking status: Former Smoker    Packs/day: 0.50    Years: 30.00    Pack years: 15.00    Last attempt to quit: 11/05/2012    Years since quitting: 5.6  . Smokeless tobacco: Never Used  . Tobacco comment: 30 years at most off and on smoking 03/14/15  Substance and Sexual Activity  . Alcohol use: Yes    Alcohol/week: 0.0 standard drinks    Comment: 2 drinks per day  . Drug use: Yes    Comment: Occasional Marijuana use  . Sexual activity: Not on file  Lifestyle  . Physical activity:    Days per week: Not on file    Minutes per session: Not on file  . Stress: Not on file  Relationships  . Social connections:    Talks on phone: Not on file    Gets together: Not on file    Attends religious service: Not on file    Active member of club or organization: Not on file    Attends meetings of clubs or organizations: Not on file    Relationship status: Not on file  . Intimate partner violence:    Fear of current or ex partner: Not on file    Emotionally abused: Not on file    Physically abused: Not on file    Forced sexual activity: Not on file  Other Topics Concern  . Not on file  Social History Narrative   Originally from [place]. Has lived in [place], [place], [place], & [place] since 1993. 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 Currently has a dog & cats. Previously had a parrot (conure) 12 years ago for 1 year but in same house.  Has also had excessive exposure to pigeon feces. He has had very brief exposure to asbestos around 2000. No hot tub exposure.  He has inhaled chemical fumes/gases/refridgerants through his work.      Marital status: married x 26 years      Children: 2 children (19, 37); no grandchildren      Lives: Lives at home with his wife      Employment: [employer]      Tobacco: none; quit in 2013; smoked x 1/2 ppd x 20 years      Alcohol:  10 drinks; usually  2 drinks per day and then skip two days.      Drugs:  None      Exercise: none; job is physically demanding      Right-handed.   1 cup caffeine daily.    Family History  Problem Relation Age of Onset  . Breast cancer Mother        Breast Cancer  . Parkinson's disease Mother   . Heart disease Father        CABG, stenting cardiac  . Cancer Brother 24       prostate cancer  . Lung disease Neg Hx   . Autoimmune disease Neg Hx      Review of Systems  Constitutional: Negative.  Negative for chills and fever.  Gastrointestinal: Negative for nausea and vomiting.  Skin: Negative.   Neurological: Negative for dizziness and headaches.   Vitals:   07/06/18 1118  BP: 134/72  Pulse: 76  Resp: 16  Temp: 98.4 F (36.9 C)  SpO2: 96%     Physical Exam  Constitutional: He is oriented to person, place, and time. He appears well-developed.  Obese  HENT:  Head: Normocephalic and atraumatic.  Eyes: Pupils are equal, round, and reactive to light. EOM are normal.  Neck: Normal range of motion. Neck supple.  Cardiovascular: Normal rate and regular rhythm.  Pulmonary/Chest: Effort normal and breath sounds normal.  Neurological: He is alert and oriented to person, place, and time.  Skin: Skin is warm and dry. Capillary refill takes less than 2 seconds.  Psychiatric: He has a normal mood and affect. His behavior is normal.  Vitals reviewed.      ASSESSMENT & PLAN: Nicky was seen today for nail problem.  Diagnoses and all orders for this visit:  Ingrown toenail of left foot -     cephALEXin (KEFLEX) 500 MG capsule; Take 1 capsule (500 mg total)  by mouth 3 (three) times daily for 7 days. -     Ambulatory referral to Podiatry  Insomnia, unspecified type -     hydrOXYzine (ATARAX/VISTARIL) 50 MG tablet; Take 1 tablet (50 mg total) by mouth at bedtime as needed.   Patient Instructions       If you have lab work done today you will be contacted with your lab results within the next 2 weeks.  If you have not heard from [place] then please contact us. The fastest way to get your results is to register for My Chart.   IF you received an x-ray today, you will receive an invoice from [facility]. Please contact [facility] at [phone] with questions or concerns regarding your invoice.   IF you received labwork today, you will receive an invoice from [facility]. Please contact [facility] at [phone] with questions or concerns regarding your invoice.   Our billing staff will not be able to assist you with questions regarding bills from these companies.  You will be contacted with the lab results as soon as they are available. The fastest way to get your results is to activate your My Chart account. Instructions are located on the last page of this paperwork. If you have not heard from [place] regarding the results in 2 weeks, please contact this office.    Insomnia Insomnia is a sleep disorder that makes it difficult to fall asleep or to stay asleep. Insomnia can cause tiredness (fatigue), low energy, difficulty concentrating, mood swings, and poor performance at work or school. There are three different ways to classify insomnia:  Difficulty falling asleep.  Difficulty staying asleep.  Waking up too early in the morning.  Any type of insomnia can be long-term (chronic) or short-term (acute). Both are common. Short-term insomnia usually lasts for three months or less. Chronic insomnia occurs at least three times a week for longer than three months. What are the causes? Insomnia may be caused by another condition,  situation, or substance, such as:  Anxiety.  Certain medicines.  Gastroesophageal reflux disease (GERD) or other gastrointestinal conditions.  Asthma or other breathing conditions.  Restless legs syndrome, sleep apnea, or other sleep disorders.  Chronic pain.  Menopause. This may include hot flashes.  Stroke.  Abuse of alcohol, tobacco, or illegal drugs.  Depression.  Caffeine.  Neurological disorders, such as Alzheimer disease.  An overactive thyroid (hyperthyroidism).  The cause of insomnia may not be known. What increases the risk? Risk factors for insomnia include:  Gender. Women are more commonly affected than men.  Age. Insomnia is more common as you get older.  Stress. This may involve your professional or personal life.  Income. Insomnia is more common in people with lower income.  Lack of exercise.  Irregular work schedule or night shifts.  Traveling between different time zones.  What are the signs or symptoms? If you have insomnia, trouble falling asleep or trouble staying asleep is the main symptom. This may lead to other symptoms, such as:  Feeling fatigued.  Feeling nervous about going to sleep.  Not feeling rested in the morning.  Having trouble concentrating.  Feeling irritable, anxious, or depressed.  How is this treated? Treatment for insomnia depends on the cause. If your insomnia is caused by an underlying condition, treatment will focus on addressing the condition. Treatment may also include:  Medicines to help you sleep.  Counseling or therapy.  Lifestyle adjustments.  Follow these instructions at home:  Take medicines only as directed by your health care provider.  Keep regular sleeping and waking hours. Avoid naps.  Keep a sleep diary to help you and your health care provider figure out what could be causing your insomnia. Include: ? When you sleep. ? When you wake up during the night. ? How well you sleep. ? How  rested you feel the next day. ? Any side effects of medicines you are taking. ? What you eat and drink.  Make your bedroom a comfortable place where it is easy to fall asleep: ? Put up shades or special blackout curtains to block light from outside. ? Use a white noise machine to block noise. ? Keep the temperature cool.  Exercise regularly as directed by your health care provider. Avoid exercising right before bedtime.  Use relaxation techniques to manage stress. Ask your health care provider to suggest some techniques that may work well for you. These may include: ? Breathing exercises. ? Routines to release muscle tension. ? Visualizing peaceful scenes.  Cut back on alcohol, caffeinated beverages, and cigarettes, especially close to bedtime. These can disrupt your sleep.  Do not overeat or eat spicy foods right before bedtime. This can lead to digestive discomfort that can make it hard for you to sleep.  Limit screen use before bedtime. This includes: ? Watching TV. ? Using your smartphone, tablet, and computer.  Stick to a routine. This can help you fall asleep faster. Try to do a quiet activity, brush your teeth, and go to bed at the same time each night.  Get out of bed if you are still awake after  15 minutes of trying to sleep. Keep the lights down, but try reading or doing a quiet activity. When you feel sleepy, go back to bed.  Make sure that you drive carefully. Avoid driving if you feel very sleepy.  Keep all follow-up appointments as directed by your health care provider. This is important. Contact a health care provider if:  You are tired throughout the day or have trouble in your daily routine due to sleepiness.  You continue to have sleep problems or your sleep problems get worse. Get help right away if:  You have serious thoughts about hurting yourself or someone else. This information is not intended to replace advice given to you by your health care provider.  Make sure you discuss any questions you have with your health care provider. Document Released: 07/30/2000 Document Revised: 01/02/2016 Document Reviewed: 05/03/2014 Elsevier Interactive Patient Education  2018 Elsevier Inc. Ingrown Toenail An ingrown toenail occurs when the corner or sides of your toenail grow into the surrounding skin. The big toe is most commonly affected, but it can happen to any of your toes. If your ingrown toenail is not treated, you will be at risk for infection. What are the causes? This condition may be caused by:  Wearing shoes that are too small or tight.  Injury or trauma, such as stubbing your toe or having your toe stepped on.  Improper cutting or care of your toenails.  Being born with (congenital) nail or foot abnormalities, such as having a nail that is too big for your toe.  What increases the risk? Risk factors for an ingrown toenail include:  Age. Your nails tend to thicken as you get older, so ingrown nails are more common in older people.  Diabetes.  Cutting your toenails incorrectly.  Blood circulation problems.  What are the signs or symptoms? Symptoms may include:  Pain, soreness, or tenderness.  Redness.  Swelling.  Hardening of the skin surrounding the toe.  Your ingrown toenail may be infected if there is fluid, pus, or drainage. How is this diagnosed? An ingrown toenail may be diagnosed by medical history and physical exam. If your toenail is infected, your health care provider may test a sample of the drainage. How is this treated? Treatment depends on the severity of your ingrown toenail. Some ingrown toenails may be treated at home. More severe or infected ingrown toenails may require surgery to remove all or part of the nail. Infected ingrown toenails may also be treated with antibiotic medicines. Follow these instructions at home:  If you were prescribed an antibiotic medicine, finish all of it even if you start to feel  better.  Soak your foot in warm soapy water for 20 minutes, 3 times per day or as directed by your health care provider.  Carefully lift the edge of the nail away from the sore skin by wedging a small piece of cotton under the corner of the nail. This may help with the pain. Be careful not to cause more injury to the area.  Wear shoes that fit well. If your ingrown toenail is causing you pain, try wearing sandals, if possible.  Trim your toenails regularly and carefully. Do not cut them in a curved shape. Cut your toenails straight across. This prevents injury to the skin at the corners of the toenail.  Keep your feet clean and dry.  If you are having trouble walking and are given crutches by your health care provider, use them as directed.  Do  not pick at your toenail or try to remove it yourself.  Take medicines only as directed by your health care provider.  Keep all follow-up visits as directed by your health care provider. This is important. Contact a health care provider if:  Your symptoms do not improve with treatment. Get help right away if:  You have red streaks that start at your foot and go up your leg.  You have a fever.  You have increased redness, swelling, or pain.  You have fluid, blood, or pus coming from your toenail. This information is not intended to replace advice given to you by your health care provider. Make sure you discuss any questions you have with your health care provider. Document Released: 07/30/2000 Document Revised: 01/02/2016 Document Reviewed: 06/26/2014 Elsevier Interactive Patient Education  2018 Elsevier Inc.      Edwina Barth, MD [facility]

## 2018-07-07 ENCOUNTER — Telehealth: Payer: Self-pay | Admitting: Emergency Medicine

## 2018-07-07 NOTE — Telephone Encounter (Signed)
Copied from CRM [id number]. Topic: Quick Communication - See Telephone Encounter >> Jul 07, 2018 10:11 AM Windy KalataMichael, Valetta Mulroy L, NT wrote: CRM for notification. See Telephone encounter for: 07/07/18.  Patient is calling and states he was seen on 07/06/18 by Dr. Alvy BimlerSagardia and states he did not get a refill on topiramate (TOPAMAX) 50 MG tablet. He states he was told it was for his neuropathy nerve pain.  [id number] [place], [place] - [address] [place] [id number] Phone: [phone] Fax: [phone]

## 2018-07-09 ENCOUNTER — Other Ambulatory Visit: Payer: Self-pay

## 2018-07-10 ENCOUNTER — Other Ambulatory Visit: Payer: Self-pay

## 2018-07-10 ENCOUNTER — Other Ambulatory Visit: Payer: Self-pay | Admitting: Emergency Medicine

## 2018-07-10 ENCOUNTER — Other Ambulatory Visit: Payer: Self-pay | Admitting: *Deleted

## 2018-07-10 DIAGNOSIS — G6289 Other specified polyneuropathies: Secondary | ICD-10-CM

## 2018-07-10 MED ORDER — TOPIRAMATE 50 MG PO TABS: 100.0000 mg | ORAL_TABLET | Freq: Every day | ORAL | 0 refills | Status: DC

## 2018-07-10 NOTE — Telephone Encounter (Signed)
This medication was reordered on 11/21/ 2019, if I am not mistaken.  Please look into this.

## 2018-07-11 ENCOUNTER — Ambulatory Visit: Payer: BC Managed Care – PPO | Admitting: Sports Medicine

## 2018-07-11 ENCOUNTER — Encounter: Payer: Self-pay | Admitting: Sports Medicine

## 2018-07-11 DIAGNOSIS — E1142 Type 2 diabetes mellitus with diabetic polyneuropathy: Secondary | ICD-10-CM | POA: Diagnosis not present

## 2018-07-11 DIAGNOSIS — L03032 Cellulitis of left toe: Secondary | ICD-10-CM | POA: Diagnosis not present

## 2018-07-11 DIAGNOSIS — R0989 Other specified symptoms and signs involving the circulatory and respiratory systems: Secondary | ICD-10-CM

## 2018-07-11 MED ORDER — NEOMYCIN-POLYMYXIN-HC 3.5-10000-1 OT SOLN: OTIC | 0 refills | Status: DC

## 2018-07-11 NOTE — Patient Instructions (Addendum)

## 2018-07-11 NOTE — Progress Notes (Signed)
Subjective: Johnny Andrews is a 57 y.o. male patient presents to office today complaining of a moderately painful incurvated, red, hot, swollen lateral left hallux nail border x 1 week that he states that he had before and had pedicurist trims but currently on antibiotics by PCP started on Thursday Patient is diabetic and admits to circulation problems. Patient did not check his blood sugar today. Patient denies fever/chills/nausea/vomitting/any other related constitutional symptoms at this time.  Patient Active Problem List   Diagnosis Date Noted  . Carpal tunnel syndrome of right wrist 04/24/2018  . Diabetic peripheral neuropathy (HCC) 04/24/2018  . Numbness of hand 04/18/2018  . Morbid obesity (HCC) 01/27/2018  . Osteoarthritis of left knee 12/16/2017  . Osteoarthritis of right knee 12/16/2017  . Family history of prostate cancer 03/06/2017  . Pure hypercholesterolemia 09/13/2016  . Lumbar degenerative disc disease 07/22/2016  . Spinal stenosis of lumbar region 11/03/2015  . Abnormality of gait 09/15/2015  . Restrictive lung disease 05/05/2015  . 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 Hypogonadism male 02/05/2013  . Elevated PSA 11/30/2011  . OTHER DISEASES OF LUNG NOT ELSEWHERE CLASSIFIED 07/20/2010  . GOUT 04/17/2010  . SMOKER 04/17/2010  . Sleep apnea 08/22/2009  . Alcohol abuse 10/24/2008  . Hypothyroidism 09/19/2007  . Diabetes (HCC) 09/19/2007  . Essential hypertension 09/19/2007  . OSTEOARTHRITIS 09/19/2007    Current Outpatient Medications on File Prior to Visit  Medication Sig Dispense Refill  . ALPRAZolam (XANAX) 0.5 MG tablet Take 1 tablet (0.5 mg total) by mouth at bedtime as needed for anxiety. 30 tablet 2  . BD ULTRA-FINE PEN NEEDLES 29G X 12.7MM MISC USE TWO CHECK BLOOD SUGAR TWICE A DAY. APPOINTMENT NEEDED FOR FURTHER REFILLS 100 each 0  . benzonatate (TESSALON) 100 MG capsule benzonatate 100 mg capsule    . cephALEXin (KEFLEX) 500 MG capsule Take 1  capsule (500 mg total) by mouth 3 (three) times daily for 7 days. 21 capsule 0  . dapagliflozin propanediol (FARXIGA) 10 MG TABS tablet Take 20 mg by mouth daily.    . diclofenac (VOLTAREN) 75 MG EC tablet TAKE 1 TABLET BY MOUTH TWICE A DAY 180 tablet 1  . diltiazem (CARDIZEM CD) 180 MG 24 hr capsule Take 180 mg by mouth daily.  4  . diltiazem (TIAZAC) 180 MG 24 hr capsule Take 180 mg by mouth daily.  6  . glucose blood (ONE TOUCH ULTRA TEST) test strip Two times a day for ULTRA 2 ONE TOUCH 100 each 1  . hydrOXYzine (ATARAX/VISTARIL) 50 MG tablet Take 1 tablet (50 mg total) by mouth at bedtime as needed. 30 tablet 2  . insulin aspart (NOVOLOG FLEXPEN) 100 UNIT/ML FlexPen Inject 75 Units into the skin 3 (three) times daily with meals. 30 units am and 50 units pm    . Levothyroxine Sodium 137 MCG CAPS levothyroxine 137 mcg capsule  Take 2 capsules every day by oral route.    Marland Kitchen. lisinopril (PRINIVIL,ZESTRIL) 20 MG tablet Take 20 mg by mouth 2 (two) times daily.  3  . metFORMIN (GLUCOPHAGE-XR) 500 MG 24 hr tablet Take 1,000 mg by mouth 2 (two) times daily.     . predniSONE (DELTASONE) 20 MG tablet Take 1 tablet (20 mg total) by mouth daily with breakfast. 7 tablet 0  . Respiratory Therapy Supplies (ADULT MASK) MISC 2 Devices by Does not apply route once. 2 each 0  . simvastatin (ZOCOR) 20 MG tablet Take 20 mg by mouth daily.    .Marland Kitchen  terbinafine (LAMISIL) 250 MG tablet terbinafine HCl 250 mg tablet    . topiramate (TOPAMAX) 50 MG tablet Take 2 tablets (100 mg total) by mouth at bedtime. 180 tablet 0  . traMADol (ULTRAM) 50 MG tablet tramadol 50 mg tablet  TAKE 1 TABLET BY MOUTH TWICE A DAY AS NEEDED    . TRESIBA FLEXTOUCH 200 UNIT/ML SOPN INJECT 100 UNITS TWICE A DAY  6   No current facility-administered medications on file prior to visit.     No Known Allergies  Objective:  There were no vitals filed for this visit.  General: Well developed, nourished, in no acute distress, alert and oriented x3    Dermatology: Skin is warm, dry and supple bilateral. Left hallux nail appears to be  severely incurvated with hyperkeratosis formation at the distal aspects of  the  lateral nail border. (+) Erythema. (+) Edema. (+) serosanguous  drainage present. The remaining nails appear unremarkable at this time. There are no open sores, lesions or other signs of infection  present.  Vascular: Dorsalis Pedis artery 1/4 and Posterior Tibial artery pedal pulses are 0/4 bilateral with delayed capillary fill time. No pedal hair growth present. Trace lower extremity edema.   Neruologic: Vibratory sensation diminished bilateral.  Musculoskeletal: Tenderness to palpation of the Left hallux lateral nail fold. Muscular strength within normal limits in all groups bilateral.   Assesement and Plan: Problem List Items Addressed This Visit      Endocrine   Diabetic peripheral neuropathy (HCC)    Other Visit Diagnoses    Paronychia of great toe of left foot    -  Primary   Diminished pulses in lower extremity          -Discussed treatment alternatives and plan of care;Due to poor circulation and need for further testing patient is no a candidate at this time for a full avulsion procedure -Cleansed area with betadine, Using a sterile nail nipper the left hallux lateral margin was trimmed and the offending margin was removed and cleared from the field and applied antitbiotic cream and coban dressing  -Continue with Keflex  -Patient was instructed to leave the dressing intact for today and begin soaking  in a weak solution of betadine or Epsom salt and water tomorrow. Patient was instructed to  soak for 15-20 minutes each day and apply corticosporin and a gauze or bandaid dressing each day. -Patient was instructed to monitor the toe for signs of infection and return to office if toe becomes red, hot or swollen. -Advised ice, elevation, and tylenol or motrin if needed for pain.  -Patient is to return in 2 weeks  for follow up care/nail check or sooner if problems arise. Af next visit will order vascular studies for survillence.   Asencion Islam, DPM

## 2018-07-28 ENCOUNTER — Other Ambulatory Visit: Payer: Self-pay | Admitting: Emergency Medicine

## 2018-07-28 DIAGNOSIS — G4700 Insomnia, unspecified: Secondary | ICD-10-CM

## 2018-08-01 ENCOUNTER — Other Ambulatory Visit: Payer: BC Managed Care – PPO

## 2018-08-14 ENCOUNTER — Other Ambulatory Visit: Payer: Self-pay | Admitting: Emergency Medicine

## 2018-08-14 DIAGNOSIS — G6289 Other specified polyneuropathies: Secondary | ICD-10-CM

## 2018-08-14 DIAGNOSIS — Z4789 Encounter for other orthopedic aftercare: Secondary | ICD-10-CM | POA: Insufficient documentation

## 2018-08-15 NOTE — Telephone Encounter (Signed)
Requested medication (s) are due for refill today: no  Requested medication (s) are on the active medication list: yes  Last refill:  07/10/18 #180  Future visit scheduled: no  Notes to clinic:  Not delegated    Requested Prescriptions  Pending Prescriptions Disp Refills   topiramate (TOPAMAX) 50 MG tablet [Pharmacy Med Name: TOPIRAMATE 50 MG TABLET] 180 tablet 0    Sig: Take 2 tablets (100 mg total) by mouth at bedtime.     Not Delegated - Neurology: Anticonvulsants - topiramate & zonisamide Failed - 08/14/2018  8:30 PM      Failed - This refill cannot be delegated      Passed - Cr in normal range and within 360 days    Creat  Date Value Ref Range Status  09/01/2015 0.62 (L) 0.70 - 1.33 mg/dL Final   Creatinine, Ser  Date Value Ref Range Status  11/16/2017 0.76 0.76 - 1.27 mg/dL Final         Passed - C02 in normal range and within 360 days    CO2  Date Value Ref Range Status  11/16/2017 24 20 - 29 mmol/L Final         Passed - Valid encounter within last 12 months    Recent Outpatient Visits          1 month ago Ingrown toenail of left foot   [facility]   3 months ago Type 2 diabetes mellitus with diabetic polyneuropathy, with long-term current use of insulin [facility]   [facility], [place]   5 months ago Cellulitis of buttock   [facility], [place] Part[place]rey R, [place]   7 months ago Productive cough   [facility], [place]   9 months ago Acute pain of both knees   [facility], Myrle ShengKristi M, MD

## 2018-09-12 ENCOUNTER — Other Ambulatory Visit: Payer: Self-pay | Admitting: Family Medicine

## 2018-09-13 NOTE — Telephone Encounter (Signed)
Please advise  Patient is requesting a refill of the following medications: Requested Prescriptions   Pending Prescriptions Disp Refills  . ALPRAZolam (XANAX) 0.5 MG tablet [Pharmacy Med Name: ALPRAZOLAM 0.5 MG TABLET] 30 tablet 2    Sig: TAKE 1 TABLET (0.5 MG TOTAL) BY MOUTH AT BEDTIME AS NEEDED FOR ANXIETY.

## 2018-09-14 ENCOUNTER — Telehealth: Payer: Self-pay | Admitting: Emergency Medicine

## 2018-09-14 NOTE — Telephone Encounter (Signed)
Copied from CRM [id number]. Topic: Quick Communication - Rx Refill/Question >> Sep 14, 2018  5:03 PM Marylen Ponto wrote: Medication: ALPRAZolam Prudy Feeler) 0.5 MG tablet  Has the patient contacted their pharmacy? yes   Preferred Pharmacy (with phone number or street name): [id number] [place], [place] - [address] [phone] (Phone)  [phone] (Fax)  Agent: Please be advised that RX refills may take up to 3 business days. We ask that you follow-up with your pharmacy.

## 2018-09-15 ENCOUNTER — Other Ambulatory Visit: Payer: Self-pay | Admitting: Emergency Medicine

## 2018-09-15 DIAGNOSIS — G6289 Other specified polyneuropathies: Secondary | ICD-10-CM

## 2018-09-15 NOTE — Telephone Encounter (Signed)
Requested medication (s) are due for refill today: yes  Requested medication (s) are on the active medication list: yes    Last refill: 07/10/18  #180  0 refills  Future visit scheduled No  Notes to clinic:not delegated  Requested Prescriptions  Pending Prescriptions Disp Refills   topiramate (TOPAMAX) 50 MG tablet [Pharmacy Med Name: TOPIRAMATE 50 MG TABLET] 180 tablet 0    Sig: Take 2 tablets (100 mg total) by mouth at bedtime.     Not Delegated - Neurology: Anticonvulsants - topiramate & zonisamide Failed - 09/15/2018  1:34 PM      Failed - This refill cannot be delegated      Passed - Cr in normal range and within 360 days    Creat  Date Value Ref Range Status  09/01/2015 0.62 (L) 0.70 - 1.33 mg/dL Final   Creatinine, Ser  Date Value Ref Range Status  11/16/2017 0.76 0.76 - 1.27 mg/dL Final         Passed - C02 in normal range and within 360 days    CO2  Date Value Ref Range Status  11/16/2017 24 20 - 29 mmol/L Final         Passed - Valid encounter within last 12 months    Recent Outpatient Visits          2 months ago Ingrown toenail of left foot   [facility] at [place], [place], [place]   4 months ago Type 2 diabetes mellitus with diabetic polyneuropathy, with long-term current use of insulin [facility])   Primary Care at [facility], [place], [place]   6 months ago Cellulitis of buttock   [facility], Asencion Partridge, [place]   8 months ago Productive cough   Primary Care at [facility], [place], [place]   10 months ago Acute pain of both knees   Primary Care at [facility], Myrle Sheng, [place]

## 2018-09-15 NOTE — Telephone Encounter (Signed)
Pt would like the nurse to call him back regarding his xanax. He is advised it was denied due to not being prescribed by [place]. He said that it was prescribed by Dr Creta LevinStallings. Please advise if he needs to make an appt

## 2018-09-15 NOTE — Telephone Encounter (Signed)
Please schedule pt with Dr. Noberto Retort for medication refill. Same day appointment is fine.

## 2018-09-15 NOTE — Telephone Encounter (Signed)
Please advise 

## 2018-09-15 NOTE — Telephone Encounter (Signed)
Needs OV or you can send request to Dr. Creta Levin.

## 2018-09-15 NOTE — Telephone Encounter (Signed)
Patient is requesting a refill of the following medications: Requested Prescriptions   Pending Prescriptions Disp Refills  . ALPRAZolam (XANAX) 0.5 MG tablet 30 tablet 2    Sig: Take 1 tablet (0.5 mg total) by mouth at bedtime as needed for anxiety.    Date of patient request: 09/14/2018 Last office visit: 07/06/2018 Date of last refill: 04/20/2018 Last refill amount: 30 RF 2 Follow up time period per chart:

## 2018-09-18 ENCOUNTER — Other Ambulatory Visit: Payer: Self-pay | Admitting: Family Medicine

## 2018-09-18 NOTE — Telephone Encounter (Signed)
Refill request thanks

## 2018-09-22 ENCOUNTER — Other Ambulatory Visit: Payer: Self-pay

## 2018-09-22 ENCOUNTER — Encounter: Payer: Self-pay | Admitting: Family Medicine

## 2018-09-22 ENCOUNTER — Ambulatory Visit: Payer: BC Managed Care – PPO | Admitting: Family Medicine

## 2018-09-22 VITALS — BP 140/60 | HR 98 | Temp 98.4°F | Ht 75.0 in | Wt 380.9 lb

## 2018-09-22 DIAGNOSIS — I10 Essential (primary) hypertension: Secondary | ICD-10-CM

## 2018-09-22 DIAGNOSIS — G479 Sleep disorder, unspecified: Secondary | ICD-10-CM

## 2018-09-22 DIAGNOSIS — F419 Anxiety disorder, unspecified: Secondary | ICD-10-CM

## 2018-09-22 DIAGNOSIS — Z794 Long term (current) use of insulin: Secondary | ICD-10-CM

## 2018-09-22 DIAGNOSIS — T148XXA Other injury of unspecified body region, initial encounter: Secondary | ICD-10-CM

## 2018-09-22 DIAGNOSIS — S90932A Unspecified superficial injury of left great toe, initial encounter: Secondary | ICD-10-CM | POA: Diagnosis not present

## 2018-09-22 DIAGNOSIS — E1142 Type 2 diabetes mellitus with diabetic polyneuropathy: Secondary | ICD-10-CM

## 2018-09-22 MED ORDER — BUSPIRONE HCL 5 MG PO TABS: 5.0000 mg | ORAL_TABLET | Freq: Three times a day (TID) | ORAL | 3 refills | Status: DC

## 2018-09-22 NOTE — Patient Instructions (Signed)
° ° ° °  If you have lab work done today you will be contacted with your lab results within the next 2 weeks.  If you have not heard from us then please contact us. The fastest way to get your results is to register for My Chart. ° ° °IF you received an x-ray today, you will receive an invoice from [facility]. Please contact [facility] at [phone] with questions or concerns regarding your invoice.  ° °IF you received labwork today, you will receive an invoice from [facility]. Please contact [facility] at [phone] with questions or concerns regarding your invoice.  ° °Our billing staff will not be able to assist you with questions regarding bills from these companies. ° °You will be contacted with the lab results as soon as they are available. The fastest way to get your results is to activate your My Chart account. Instructions are located on the last page of this paperwork. If you have not heard from us regarding the results in 2 weeks, please contact this office. °  ° ° ° °

## 2018-09-22 NOTE — Progress Notes (Signed)
Established Patient Office Visit  Subjective:  Patient ID: Johnny Andrews, male    DOB: [birth year]  Age: 58 y.o. MRN: [id number]  CC:  Chief Complaint  Patient presents with  . left toe cut    has not healed within a month   . Medication Refill    xanax    HPI Johnny Andrews presents for  Pt needs a refill of alprazolam for his anxiety Patient reports that if he goes to sleep and his mind is racing he takes the xanax He states that he does not take a daily anxiety medication for anxiety He states that if he wakes up at 2:30am he cannot go back to sleep and typically he will take a xanax for that so that he can get back to sleep and if he does not take a xanax this can go on for 3 days. He states that work or finance seems to be the culprits  Depression screen [facility] 2/9 07/06/2018 04/28/2018 12/30/2017 11/16/2017 07/11/2017  Decreased Interest 0 0 0 0 0  Down, Depressed, Hopeless 0 0 0 0 0  PHQ - 2 Score 0 0 0 0 0   GAD 7 : Generalized Anxiety Score 09/22/2018  Nervous, Anxious, on Edge 1  Control/stop worrying 1  Worry too much - different things 1  Trouble relaxing 1  Restless 0  Easily annoyed or irritable 0  Afraid - awful might happen 0  Total GAD 7 Score 4  Anxiety Difficulty Somewhat difficult   Diabetes He sees Dr. Talmage Nap for Endocrinology His a1c is <6% He is not exercising He is compliant with his treatment plan  Lab Results  Component Value Date   HGBA1C 8.6 11/16/2017   Hypertension: Patient here for follow-up of elevated blood pressure. He is not exercising and is adherent to low salt diet.  Blood pressure is well controlled at home. Cardiac symptoms none. Patient denies chest pain, chest pressure/discomfort, dyspnea, exertional chest pressure/discomfort, irregular heart beat and lower extremity edema.  Cardiovascular risk factors: diabetes mellitus, dyslipidemia, hypertension, male gender and obesity (BMI >= 30 kg/m2). Use of agents associated with  hypertension: none. History of target organ damage: none. BP Readings from Last 3 Encounters:  09/22/18 140/60  07/06/18 134/72  04/28/18 129/73      Past Medical History:  Diagnosis Date  . ALCOHOL USE 10/24/2008  . ANXIETY 09/19/2007  . Cough 05/22/2008  . DIABETES MELLITUS, TYPE II 09/19/2007  . External thrombosed hemorrhoids 08/23/2008  . GOUT 04/17/2010  . HYPERLIPIDEMIA 09/19/2007  . HYPERTENSION 09/19/2007  . HYPOTHYROIDISM 09/19/2007  . Leg pain   . OSTEOARTHRITIS 09/19/2007  . OTHER DISEASES OF LUNG NOT ELSEWHERE CLASSIFIED 07/20/2010  . Rash and other nonspecific skin eruption 09/19/2007  . Restrictive lung disease   . SCROTAL ABSCESS 05/27/2010  . Sebaceous cyst 08/01/2008  . SLEEP APNEA 08/22/2009  . SMOKER 04/17/2010    Past Surgical History:  Procedure Laterality Date  . COLONOSCOPY    . NASAL SEPTUM SURGERY      Family History  Problem Relation Age of Onset  . Breast cancer Mother        Breast Cancer  . Parkinson's disease Mother   . Heart disease Father        CABG, stenting cardiac  . Cancer Brother 56       prostate cancer  . Lung disease Neg Hx   . Autoimmune disease Neg Hx     Social History   Socioeconomic History  .  Marital status: Married    Spouse name: Not on file  . Number of children: 2  . Years of education: BS  . Highest education level: Not on file  Occupational History  . Occupation: Software engineerHVAC    Employer: [employer]  Social Needs  . Financial resource strain: Not on file  . Food insecurity:    Worry: Not on file    Inability: Not on file  . Transportation needs:    Medical: Not on file    Non-medical: Not on file  Tobacco Use  . Smoking status: Former Smoker    Packs/day: 0.50    Years: 30.00    Pack years: 15.00    Last attempt to quit: 11/05/2012    Years since quitting: 5.8  . Smokeless tobacco: Never Used  . Tobacco comment: 30 years at most off and on smoking 03/14/15  Substance and Sexual Activity  . Alcohol use: Yes     Alcohol/week: 0.0 standard drinks    Comment: 2 drinks per day  . Drug use: Yes    Comment: Occasional Marijuana use  . Sexual activity: Not on file  Lifestyle  . Physical activity:    Days per week: Not on file    Minutes per session: Not on file  . Stress: Not on file  Relationships  . Social connections:    Talks on phone: Not on file    Gets together: Not on file    Attends religious service: Not on file    Active member of club or organization: Not on file    Attends meetings of clubs or organizations: Not on file    Relationship status: Not on file  . Intimate partner violence:    Fear of current or ex partner: Not on file    Emotionally abused: Not on file    Physically abused: Not on file    Forced sexual activity: Not on file  Other Topics Concern  . Not on file  Social History Narrative   Originally from [place]. [place] in [place], [place] & [place] 1993. 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 Currently has a dog & cats. Previously had a parrot (conure) 12 years ago for 1 year but in same house.  Has also had excessive exposure to pigeon feces. He has had very brief exposure to asbestos around 2000. No hot tub exposure. He has inhaled chemical fumes/gases/refridgerants through his work.      Marital status: married x 26 years      Children: 2 children (19, [id number]); no grandchildren      Lives: Lives at home with his wife      Employment: [employer]      Tobacco: none; quit in 2013; smoked x 1/2 ppd x 20 years      Alcohol:  10 drinks; usually 2 drinks per day and then skip two days.      Drugs:  None      Exercise: none; job is physically demanding      Right-handed.   1 cup caffeine daily.    Outpatient Medications Prior to Visit  Medication Sig Dispense Refill  . ALPRAZolam (XANAX) 0.5 MG tablet Take 1 tablet (0.5 mg total) by mouth at bedtime as needed for anxiety. 30 tablet 2  . BD ULTRA-FINE  PEN NEEDLES 29G X 12.7MM MISC USE TWO CHECK BLOOD SUGAR TWICE A DAY. APPOINTMENT  NEEDED FOR FURTHER REFILLS 100 each 0  . dapagliflozin propanediol (FARXIGA) 10 MG TABS tablet Take 20 mg by mouth daily.    . diclofenac (VOLTAREN) 75 MG EC tablet TAKE 1 TABLET BY MOUTH TWICE A DAY 180 tablet 1  . glucose blood (ONE TOUCH ULTRA TEST) test strip Two times a day for ULTRA 2 ONE TOUCH 100 each 1  . hydrOXYzine (ATARAX/VISTARIL) 50 MG tablet Take 1 tablet (50 mg total) by mouth at bedtime as needed. 30 tablet 2  . insulin aspart (NOVOLOG FLEXPEN) 100 UNIT/ML FlexPen Inject 75 Units into the skin 3 (three) times daily with meals. 30 units am and 50 units pm    . Levothyroxine Sodium 137 MCG CAPS levothyroxine 137 mcg capsule  Take 2 capsules every day by oral route.    Marland Kitchen lisinopril (PRINIVIL,ZESTRIL) 20 MG tablet Take 20 mg by mouth 2 (two) times daily.  3  . metFORMIN (GLUCOPHAGE-XR) 500 MG 24 hr tablet Take 1,000 mg by mouth 2 (two) times daily.     Marland Kitchen Respiratory Therapy Supplies (ADULT MASK) MISC 2 Devices by Does not apply route once. 2 each 0  . simvastatin (ZOCOR) 20 MG tablet Take 20 mg by mouth daily.    Marland Kitchen topiramate (TOPAMAX) 50 MG tablet TAKE 2 TABLETS (100 MG TOTAL) BY MOUTH AT BEDTIME. 180 tablet 0  . TRESIBA FLEXTOUCH 200 UNIT/ML SOPN INJECT 100 UNITS TWICE A DAY  6  . traMADol (ULTRAM) 50 MG tablet tramadol 50 mg tablet  TAKE 1 TABLET BY MOUTH TWICE A DAY AS NEEDED    . diltiazem (TIAZAC) 180 MG 24 hr capsule Take 180 mg by mouth daily.  6  . benzonatate (TESSALON) 100 MG capsule benzonatate 100 mg capsule    . diltiazem (CARDIZEM CD) 180 MG 24 hr capsule Take 180 mg by mouth daily.  4  . neomycin-polymyxin-hydrocortisone (CORTISPORIN) OTIC solution Apply 1-2 drops tice a day to nail 10 mL 0  . predniSONE (DELTASONE) 20 MG tablet Take 1 tablet (20 mg total) by mouth daily with breakfast. 7 tablet 0  . terbinafine (LAMISIL) 250 MG tablet terbinafine HCl 250 mg tablet     No  facility-administered medications prior to visit.     No Known Allergies  ROS Review of Systems Review of Systems  Constitutional: Negative for activity change, appetite change, chills and fever.  HENT: Negative for congestion, nosebleeds, trouble swallowing and voice change.   Respiratory: Negative for cough, shortness of breath and wheezing.   Gastrointestinal: Negative for diarrhea, nausea and vomiting.  Genitourinary: Negative for difficulty urinating, dysuria, flank pain and hematuria.  Musculoskeletal: Negative for back pain, joint swelling and neck pain.  Neurological: Negative for dizziness, speech difficulty, light-headedness and numbness.  See HPI. All other review of systems negative.     Objective:    Physical Exam  BP 140/60   Pulse 98   Temp 98.4 F (36.9 C) (Oral)   Ht 6\' 3"  (1.905 m)   Wt (!) 380 lb 14.4 oz (172.8 kg)   SpO2 94%   BMI 47.61 kg/m  Wt Readings from Last 3 Encounters:  09/22/18 (!) 380 lb 14.4 oz (172.8 kg)  07/06/18 (!) 378 lb 12.8 oz (171.8 kg)  04/28/18 (!) 378 lb 12.8 oz (171.8 kg)   Physical Exam  Constitutional: Oriented to person, place, and time. Appears well-developed and well-nourished.  HENT:  Head: Normocephalic and atraumatic.  Eyes: Conjunctivae and EOM are normal.  Cardiovascular: Normal rate, regular rhythm, normal  heart sounds and intact distal pulses.  No murmur heard. Pulmonary/Chest: Effort normal and breath sounds normal. No stridor. No respiratory distress. Has no wheezes.  Neurological: Is alert and oriented to person, place, and time.  Skin: Skin is warm. Capillary refill takes less than 2 seconds.  Skin breakdown superficial on the medial aspect of the great toe Psychiatric: Has a normal mood and affect. Behavior is normal. Judgment and thought content normal.    Health Maintenance Due  Topic Date Due  . FOOT EXAM  08/31/2017    There are no preventive care reminders to display for this patient.  Lab  Results  Component Value Date   TSH 2.890 11/16/2017   Lab Results  Component Value Date   WBC 9.6 11/16/2017   HGB 15.5 11/16/2017   HCT 43.4 11/16/2017   MCV 85 11/16/2017   PLT 227 11/16/2017   Lab Results  Component Value Date   NA 142 11/16/2017   K 4.4 11/16/2017   CO2 24 11/16/2017   GLUCOSE 82 11/16/2017   BUN 17 11/16/2017   CREATININE 0.76 11/16/2017   BILITOT 0.4 11/16/2017   ALKPHOS 78 11/16/2017   AST 25 11/16/2017   ALT 30 11/16/2017   PROT 6.6 11/16/2017   ALBUMIN 4.2 11/16/2017   CALCIUM 9.4 11/16/2017   GFR 134.72 02/05/2013   Lab Results  Component Value Date   CHOL 157 11/16/2017   Lab Results  Component Value Date   HDL 40 11/16/2017   Lab Results  Component Value Date   LDLCALC 70 11/16/2017   Lab Results  Component Value Date   TRIG 236 (H) 11/16/2017   Lab Results  Component Value Date   CHOLHDL 3.9 11/16/2017   Lab Results  Component Value Date   HGBA1C 8.6 11/16/2017      Assessment & Plan:   Problem List Items Addressed This Visit      Cardiovascular and Mediastinum   Essential hypertension - Primary Patient's blood pressure is at goal of 139/89 or less. Condition is stable. Continue current medications and treatment plan. I recommend that you exercise for 30-45 minutes 5 days a week. I also recommend a balanced diet with fruits and vegetables every day, lean meats, and little fried foods. The DASH diet (you can find this online) is a good example of this.      Endocrine   Diabetes (HCC)  -  Patient with diabetes and polyneuropathy Advised pt to follow up and get an ABI Right now his pulses are strong but his foot feels colds and he has risk factors He does not currently smoke but he has a shallow skin breakdown on the left great toe that is slow to heal He has some overlapping symptoms of cramps in the buttocks that he attributes to lack of exercise.  Our clinic nurse administrator was notified to set this patient up  for ABI    Other Visit Diagnoses    Superficial laceration of skin    - advised to prevent friction of the skin     Anxiety - advised to try buspar. WILL NOT GIVE XANAX  Meds ordered this encounter  Medications  . busPIRone (BUSPAR) 5 MG tablet    Sig: Take 1 tablet (5 mg total) by mouth 3 (three) times daily.    Dispense:  30 tablet    Refill:  3    Follow-up: Return in about 3 months (around 12/21/2018) for follow up on bp and diabetes with Dr. Alvy BimlerSagardia.  Jerzey Komperda A Arlyne Brandes, MD 

## 2018-09-23 LAB — LIPID PANEL
Chol/HDL Ratio: 3.7 ratio (ref 0.0–5.0)
Cholesterol, Total: 151 mg/dL (ref 100–199)
HDL: 41 mg/dL (ref >39)
LDL CALC: 46 mg/dL (ref 0–99)
Triglycerides: 321 mg/dL — ABNORMAL HIGH (ref 0–149)
VLDL Cholesterol Cal: 64 mg/dL — ABNORMAL HIGH (ref 5–40)

## 2018-09-23 LAB — CMP14+EGFR
ALK PHOS: 82 IU/L (ref 39–117)
ALT: 19 IU/L (ref 0–44)
AST: 17 IU/L (ref 0–40)
Albumin/Globulin Ratio: 1.7 (ref 1.2–2.2)
Albumin: 4.1 g/dL (ref 3.8–4.9)
BILIRUBIN TOTAL: 0.5 mg/dL (ref 0.0–1.2)
BUN/Creatinine Ratio: 28 — ABNORMAL HIGH (ref 9–20)
BUN: 22 mg/dL (ref 6–24)
CO2: 22 mmol/L (ref 20–29)
Calcium: 9.7 mg/dL (ref 8.7–10.2)
Chloride: 104 mmol/L (ref 96–106)
Creatinine, Ser: 0.78 mg/dL (ref 0.76–1.27)
GFR calc Af Amer: 116 mL/min/{1.73_m2} (ref >59)
GFR calc non Af Amer: 100 mL/min/{1.73_m2} (ref >59)
Globulin, Total: 2.4 g/dL (ref 1.5–4.5)
Glucose: 117 mg/dL — ABNORMAL HIGH (ref 65–99)
Potassium: 4.8 mmol/L (ref 3.5–5.2)
SODIUM: 142 mmol/L (ref 134–144)
Total Protein: 6.5 g/dL (ref 6.0–8.5)

## 2018-09-23 LAB — HEMOGLOBIN A1C
Est. average glucose Bld gHb Est-mCnc: 140 mg/dL
Hgb A1c MFr Bld: 6.5 % — ABNORMAL HIGH (ref 4.8–5.6)

## 2018-09-25 NOTE — Telephone Encounter (Signed)
pls see note. Last office visit 09/22/18. Medication last filled on 04/20/18 #30. Follow up 12/21/18.

## 2018-10-25 ENCOUNTER — Other Ambulatory Visit: Payer: Self-pay

## 2018-10-25 ENCOUNTER — Ambulatory Visit: Payer: BC Managed Care – PPO | Admitting: Family Medicine

## 2018-10-25 ENCOUNTER — Encounter: Payer: Self-pay | Admitting: Family Medicine

## 2018-10-25 VITALS — BP 118/72 | HR 96 | Temp 98.2°F | Resp 16 | Ht 75.0 in | Wt 378.2 lb

## 2018-10-25 DIAGNOSIS — R252 Cramp and spasm: Secondary | ICD-10-CM | POA: Diagnosis not present

## 2018-10-25 DIAGNOSIS — G6289 Other specified polyneuropathies: Secondary | ICD-10-CM

## 2018-10-25 DIAGNOSIS — M25562 Pain in left knee: Secondary | ICD-10-CM

## 2018-10-25 DIAGNOSIS — G8929 Other chronic pain: Secondary | ICD-10-CM

## 2018-10-25 DIAGNOSIS — M48061 Spinal stenosis, lumbar region without neurogenic claudication: Secondary | ICD-10-CM | POA: Diagnosis not present

## 2018-10-25 DIAGNOSIS — M25561 Pain in right knee: Secondary | ICD-10-CM

## 2018-10-25 NOTE — Progress Notes (Signed)
Subjective:    Patient ID: Johnny Andrews, male    DOB: 02-Apr-1961, 58 y.o.   MRN: [id number]  HPI Johnny LongestMichael S Hanneman is a 58 y.o. male Presents today for: Chief Complaint  Patient presents with  . Leg Pain    pain in left leg. Sever case of neuropathy.  Do see a neurology. Need medical leave form filled out.   PCP is Georgina QuintSagardia, Miguel Jose, MD  Presents with left leg pain.   Micah FlesherWent out on disability today at work - here for doctors' signatures. 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  Involves working on [employer]es, lifting, climbing ladders.  Has had some trouble with this work due to decreased sensation in feet with peripheral neuropathy, and knee pain. Trouble standing for more than 3 minutes, and pain with bending/squatting.  Has had nerve testing in past. Severe neuropathy - from diabetes, but has had spinal stenosis as well.  Followed by Dr. Ethelene Halamos at [facility] for back injections, no prior surgery to back. Followed by [facility] Luiz BlareGraves [facility] for knee issues - no prior surgery. Has had  Injections in knees for osteoarthritis.  Knee, back, leg pains, and numbness in feet.  Neuro note March 2017,  Dr. Terrace ArabiaYan. " He returned for electrodiagnostic study today, which showed evidence of severe axonal sensorimotor polyneuropathy, in addition, there is evidence of active bilateral lumbosacral radiculopathy involving bilateral L4-5 S1 myotomes., I saw evidence of active denervation at bilateral gluteus medius muscles, bilateral lumbosacral paraspinal muscles"  History of spinal stenosis of lumbar region.  Lumbar degenerative disc disease.  Lumbar MRI 09/28/2015: L1-2 mild spinal stenosis.  Severe spinal stenosis L2-3.  Moderate spinal stenosis L3-4.  Central disc herniation L4-5 with severe spinal stenosis.  L5-S1, left paramedian disc herniation, mild spinal stenosis, probable left S1 nerve root compression.  Probable right L5 nerve root  compression.   Not planning on returning to work.    Left calf pain past 1 month, comes and goes.  No hx of DVT.  Knees swell, but has not noticed calf swelling/pain.  Pain in calf at rest, not with activity.  Left side only.  Carpal tunnel surgery 2 months ago. No recent prolonged car travel or air travel.     Patient Active Problem List   Diagnosis Date Noted  . Carpal tunnel syndrome of right wrist 04/24/2018  . Diabetic peripheral neuropathy (HCC) 04/24/2018  . Numbness of hand 04/18/2018  . Morbid obesity (HCC) 01/27/2018  . Osteoarthritis of left knee 12/16/2017  . Osteoarthritis of right knee 12/16/2017  . Family history of prostate cancer 03/06/2017  . Pure hypercholesterolemia 09/13/2016  . Lumbar degenerative disc disease 07/22/2016  . Spinal stenosis of lumbar region 11/03/2015  . Abnormality of gait 09/15/2015  . Restrictive lung disease 05/05/2015  . 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 Hypogonadism male 02/05/2013  . Elevated PSA 11/30/2011  . OTHER DISEASES OF LUNG NOT ELSEWHERE CLASSIFIED 07/20/2010  . GOUT 04/17/2010  . SMOKER 04/17/2010  . Sleep apnea 08/22/2009  . Alcohol abuse 10/24/2008  . Hypothyroidism 09/19/2007  . Diabetes (HCC) 09/19/2007  . Essential hypertension 09/19/2007  . OSTEOARTHRITIS 09/19/2007   Past Medical History:  Diagnosis Date  . ALCOHOL USE 10/24/2008  . ANXIETY 09/19/2007  . Cough 05/22/2008  . DIABETES MELLITUS, TYPE II 09/19/2007  . External thrombosed hemorrhoids 08/23/2008  . GOUT 04/17/2010  . HYPERLIPIDEMIA 09/19/2007  . HYPERTENSION 09/19/2007  .  HYPOTHYROIDISM 09/19/2007  . Leg pain   . OSTEOARTHRITIS 09/19/2007  . OTHER DISEASES OF LUNG NOT ELSEWHERE CLASSIFIED 07/20/2010  . Rash and other nonspecific skin eruption 09/19/2007  . Restrictive lung disease   . SCROTAL ABSCESS 05/27/2010  . Sebaceous cyst 08/01/2008  . SLEEP APNEA 08/22/2009  . SMOKER 04/17/2010   Past Surgical History:  Procedure Laterality Date  .  COLONOSCOPY    . NASAL SEPTUM SURGERY     No Known Allergies Prior to Admission medications   Medication Sig Start Date End Date Taking? Authorizing Provider  lisinopril-hydrochlorothiazide (PRINZIDE,ZESTORETIC) 20-12.5 MG tablet Take 1 tablet by mouth daily.   Yes [provider]  ALPRAZolam (XANAX) 0.5 MG tablet TAKE 1 TABLET (0.5 MG TOTAL) BY MOUTH AT BEDTIME AS NEEDED FOR ANXIETY. 09/25/18   Georgina Quint, MD  BD ULTRA-FINE PEN NEEDLES 29G X 12.7MM MISC USE TWO CHECK BLOOD SUGAR TWICE A DAY. APPOINTMENT NEEDED FOR FURTHER REFILLS 03/09/16   Reather Littler, MD  busPIRone (BUSPAR) 5 MG tablet Take 1 tablet (5 mg total) by mouth 3 (three) times daily. 09/22/18   Doristine Bosworth, MD  diclofenac (VOLTAREN) 75 MG EC tablet TAKE 1 TABLET BY MOUTH TWICE A DAY 12/21/17   Ethelda Chick, MD  diltiazem [facility]) 180 MG 24 hr capsule Take 180 mg by mouth daily. 02/10/17   [provider]  glucose blood (ONE TOUCH ULTRA TEST) test strip Two times a day for ULTRA 2 ONE TOUCH 04/19/18   Georgina Quint, MD  hydrOXYzine (ATARAX/VISTARIL) 50 MG tablet Take 1 tablet (50 mg total) by mouth at bedtime as needed. 07/06/18   Georgina Quint, MD  insulin aspart (NOVOLOG FLEXPEN) 100 UNIT/ML FlexPen Inject 75 Units into the skin 3 (three) times daily with meals. 30 units am and 50 units pm    [provider]  Levothyroxine Sodium 137 MCG CAPS levothyroxine 137 mcg capsule  Take 2 capsules every day by oral route.    [provider]  metFORMIN (GLUCOPHAGE-XR) 500 MG 24 hr tablet Take 1,000 mg by mouth 2 (two) times daily.  09/15/15   [provider]  Respiratory Therapy Supplies (ADULT MASK) MISC 2 Devices by Does not apply route once. 11/24/11   Oretha Milch, MD  simvastatin (ZOCOR) 20 MG tablet Take 20 mg by mouth daily.    [provider]  topiramate (TOPAMAX) 50 MG tablet TAKE 2 TABLETS (100 MG TOTAL) BY MOUTH AT BEDTIME. 09/18/18   Georgina Quint, MD  TRESIBA FLEXTOUCH 200 UNIT/ML SOPN INJECT 100 UNITS TWICE A DAY 12/17/16   [provider]   Social History   Socioeconomic History  . Marital status: Married    Spouse name: Not on file  . Number of children: 2  . Years of education: BS  . Highest education level: Not on file  Occupational History  . Occupation: Software engineer: [employer]  Social Needs  . Financial resource strain: Not on file  . Food insecurity:    Worry: Not on file    Inability: Not on file  . Transportation needs:    Medical: Not on file    Non-medical: Not on file  Tobacco Use  . Smoking status: Former Smoker    Packs/day: 0.50    Years: 30.00    Pack years: 15.00    Last attempt to quit: 11/05/2012    Years since quitting: 5.9  . Smokeless tobacco: Never Used  .  Tobacco comment: 30 years at most off and on smoking 03/14/15  Substance and Sexual Activity  . Alcohol use: Yes    Alcohol/week: 0.0 standard drinks    Comment: 2 drinks per day  . Drug use: Yes    Comment: Occasional Marijuana use  . Sexual activity: Not on file  Lifestyle  . Physical activity:    Days per week: Not on file    Minutes per session: Not on file  . Stress: Not on file  Relationships  . Social connections:    Talks on phone: Not on file    Gets together: Not on file    Attends religious service: Not on file    Active member of club or organization: Not on file    Attends meetings of clubs or organizations: Not on file    Relationship status: Not on file  . Intimate partner violence:    Fear of current or ex partner: Not on file    Emotionally abused: Not on file    Physically abused: Not on file    Forced sexual activity: Not on file  Other Topics Concern  . Not on file  Social History Narrative   Originally from [place]. Has lived in [place], [place], [place], & [place] since 1993. 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 Currently has a dog & cats.  Previously had a parrot (conure) 12 years ago for 1 year but in same house.  Has also had excessive exposure to pigeon feces. He has had very brief exposure to asbestos around 2000. No hot tub exposure. He has inhaled chemical fumes/gases/refridgerants through his work.      Marital status: married x 26 years      Children: 2 children (19, 80); no grandchildren      Lives: Lives at home with his wife      Employment: [employer]      Tobacco: none; quit in 2013; smoked x 1/2 ppd x 20 years      Alcohol:  10 drinks; usually 2 drinks per day and then skip two days.      Drugs:  None      Exercise: none; job is physically demanding      Right-handed.   1 cup caffeine daily.    Review of Systems     Objective:   Physical Exam Constitutional:      General: He is not in acute distress.    Appearance: He is well-developed. He is obese.  HENT:     Head: Normocephalic and atraumatic.  Cardiovascular:     Rate and Rhythm: Normal rate.  Pulmonary:     Effort: Pulmonary effort is normal.  Musculoskeletal:     Right lower leg: Edema present.     Left lower leg: He exhibits no tenderness (calf nt, negative homans, calf circumference equal (46cm at 15cm below  patella). ) and no swelling. Edema (trace with stasis changes. ) present.  Neurological:     Mental Status: He is alert and oriented to person, place, and time.    Vitals:   10/25/18 1634  BP: (!) 142/90  Pulse: 96  Resp: 16  Temp: 98.2 F (36.8 C)  TempSrc: Oral  SpO2: 100%  Weight: (!) 378 lb 3.2 oz (171.6 kg)  Height: 6\' 3"  (1.905 m)         Assessment & Plan:  CASMIER TARDIF is a  58 y.o. male Other polyneuropathy  Cramp in lower leg  Chronic pain of both knees  Spinal stenosis of lumbar region, unspecified whether neurogenic claudication present  Chronic spinal stenosis with peripheral neuropathy.  Initial appointment scheduled for left lower leg cramps, but has had similar symptoms previously.   Reassuring exam, no apparent calf swelling.  No known DVT risk factors.  On further discussion primary reason was to start paperwork for FMLA and disability.  Will review paperwork with his primary care provider, but also may need input from orthopedic providers.  Has paperwork apparently pending from his employer and may need to coordinate with Social Security disability office for disability assessment.  If persistent or worsening cramps in calf, did recommend further evaluation including potential blood work or ultrasound.  Understanding expressed.  No orders of the defined types were placed in this encounter.  Patient Instructions   Your exam is reassuring in the calf today. I can order an ultrasound to look for blood clot, but that is less likely. If more persistent cramps, I would recommend blood work and ultrasound.   I will discuss your paperwork with primary care provider, but may need office visit. Additionally would recommend disability discussion wih your orthopaedic providers. It appears prior nerve testing was done through [facility].  If disability assessment is needed, then you may need to contact social security administration.   If you have lab work done today you will be contacted with your lab results within the next 2 weeks.  If you have not heard from [place] then please contact us. The fastest way to get your results is to register for My Chart.   IF you received an x-ray today, you will receive an invoice from [facility]. Please contact [facility] at [phone] with questions or concerns regarding your invoice.   IF you received labwork today, you will receive an invoice from [facility]. Please contact [facility] at [phone] with questions or concerns regarding your invoice.   Our billing staff will not be able to assist you with questions regarding bills from these companies.  You will be contacted with the lab results as soon as they are  available. The fastest way to get your results is to activate your My Chart account. Instructions are located on the last page of this paperwork. If you have not heard from [place] regarding the results in 2 weeks, please contact this office.       Signed,   Meredith Staggers, MD Primary Care at [facility].  10/28/18 10:18 PM

## 2018-10-25 NOTE — Patient Instructions (Addendum)
Your exam is reassuring in the calf today. I can order an ultrasound to look for blood clot, but that is less likely. If more persistent cramps, I would recommend blood work and ultrasound.   I will discuss your paperwork with primary care provider, but may need office visit. Additionally would recommend disability discussion wih your orthopaedic providers. It appears prior nerve testing was done through [facility].  If disability assessment is needed, then you may need to contact social security administration.   If you have lab work done today you will be contacted with your lab results within the next 2 weeks.  If you have not heard from [place] then please contact us. The fastest way to get your results is to register for My Chart.   IF you received an x-ray today, you will receive an invoice from [facility]. Please contact [facility] at [phone] with questions or concerns regarding your invoice.   IF you received labwork today, you will receive an invoice from [facility]. Please contact [facility] at [phone] with questions or concerns regarding your invoice.   Our billing staff will not be able to assist you with questions regarding bills from these companies.  You will be contacted with the lab results as soon as they are available. The fastest way to get your results is to activate your My Chart account. Instructions are located on the last page of this paperwork. If you have not heard from [place] regarding the results in 2 weeks, please contact this office.

## 2018-10-28 ENCOUNTER — Encounter: Payer: Self-pay | Admitting: Family Medicine

## 2018-11-06 ENCOUNTER — Ambulatory Visit: Payer: BC Managed Care – PPO | Admitting: Emergency Medicine

## 2018-11-08 ENCOUNTER — Other Ambulatory Visit: Payer: Self-pay

## 2018-11-08 ENCOUNTER — Telehealth (INDEPENDENT_AMBULATORY_CARE_PROVIDER_SITE_OTHER): Payer: BC Managed Care – PPO | Admitting: Emergency Medicine

## 2018-11-08 ENCOUNTER — Telehealth: Payer: Self-pay | Admitting: *Deleted

## 2018-11-08 DIAGNOSIS — M170 Bilateral primary osteoarthritis of knee: Secondary | ICD-10-CM

## 2018-11-08 DIAGNOSIS — G4700 Insomnia, unspecified: Secondary | ICD-10-CM | POA: Diagnosis not present

## 2018-11-08 DIAGNOSIS — E1142 Type 2 diabetes mellitus with diabetic polyneuropathy: Secondary | ICD-10-CM | POA: Diagnosis not present

## 2018-11-08 DIAGNOSIS — M48061 Spinal stenosis, lumbar region without neurogenic claudication: Secondary | ICD-10-CM

## 2018-11-08 DIAGNOSIS — F418 Other specified anxiety disorders: Secondary | ICD-10-CM | POA: Diagnosis not present

## 2018-11-08 DIAGNOSIS — Z794 Long term (current) use of insulin: Secondary | ICD-10-CM

## 2018-11-08 MED ORDER — TRAZODONE HCL 50 MG PO TABS: 50.0000 mg | ORAL_TABLET | Freq: Every evening | ORAL | 5 refills | Status: DC | PRN

## 2018-11-08 MED ORDER — ALPRAZOLAM 0.5 MG PO TABS: 0.5000 mg | ORAL_TABLET | Freq: Every day | ORAL | 1 refills | Status: DC | PRN

## 2018-11-08 NOTE — Progress Notes (Signed)
Telemedicine Encounter- SOAP NOTE Established Patient  This telephone encounter was conducted with the patient's (or proxy's) verbal consent via audio telecommunications: yes/no: Yes Patient was instructed to have this encounter in a suitably private space; and to only have persons present to whom they give permission to participate. In addition, patient identity was confirmed by use of name plus two identifiers (DOB and address).  I discussed the limitations, risks, security and privacy concerns of performing an evaluation and management service by telephone and the availability of in person appointments. I also discussed with the patient that there may be a patient responsible charge related to this service. The patient expressed understanding and agreed to proceed.  I spent a total of TIME; 0 MIN TO 60 MIN: 25 minutes talking with the patient or their proxy.  No chief complaint on file. State of anxiety  Subjective   Johnny Andrews is a 58 y.o. male established patient. Telephone visit today for increased anxiety with insomnia over the past several weeks related to chronic medical problems and acute personal situations at home. Also wants to discuss FMLA and disability paperwork.  Patient has certain severe chronic medical problems that have progressively gotten worse.  Presently under the care of an orthopedic surgeon and in the past has seen neurologist and neurosurgeons for evaluations. Patient has the following chronic medical problems: 1.  Severe lumbar spinal stenosis.  MRI done 2017 reviewed. 2.  severe advanced osteoarthritis of both knees. 3.  Diabetes complicated with neuropathy of both feet.  HPI   Patient Active Problem List   Diagnosis Date Noted  . Carpal tunnel syndrome of right wrist 04/24/2018  . Diabetic peripheral neuropathy (HCC) 04/24/2018  . Numbness of hand 04/18/2018  . Morbid obesity (HCC) 01/27/2018  . Osteoarthritis of left knee 12/16/2017  .  Osteoarthritis of right knee 12/16/2017  . Family history of prostate cancer 03/06/2017  . Pure hypercholesterolemia 09/13/2016  . Lumbar degenerative disc disease 07/22/2016  . Spinal stenosis of lumbar region 11/03/2015  . Abnormality of gait 09/15/2015  . Restrictive lung disease 05/05/2015  . 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 Hypogonadism male 02/05/2013  . Elevated PSA 11/30/2011  . OTHER DISEASES OF LUNG NOT ELSEWHERE CLASSIFIED 07/20/2010  . GOUT 04/17/2010  . SMOKER 04/17/2010  . Sleep apnea 08/22/2009  . Alcohol abuse 10/24/2008  . Hypothyroidism 09/19/2007  . Diabetes (HCC) 09/19/2007  . Essential hypertension 09/19/2007  . OSTEOARTHRITIS 09/19/2007    Past Medical History:  Diagnosis Date  . ALCOHOL USE 10/24/2008  . ANXIETY 09/19/2007  . Cough 05/22/2008  . DIABETES MELLITUS, TYPE II 09/19/2007  . External thrombosed hemorrhoids 08/23/2008  . GOUT 04/17/2010  . HYPERLIPIDEMIA 09/19/2007  . HYPERTENSION 09/19/2007  . HYPOTHYROIDISM 09/19/2007  . Leg pain   . OSTEOARTHRITIS 09/19/2007  . OTHER DISEASES OF LUNG NOT ELSEWHERE CLASSIFIED 07/20/2010  . Rash and other nonspecific skin eruption 09/19/2007  . Restrictive lung disease   . SCROTAL ABSCESS 05/27/2010  . Sebaceous cyst 08/01/2008  . SLEEP APNEA 08/22/2009  . SMOKER 04/17/2010    Current Outpatient Medications  Medication Sig Dispense Refill  . ALPRAZolam (XANAX) 0.5 MG tablet Take 1 tablet (0.5 mg total) by mouth daily as needed for anxiety. 30 tablet 1  . busPIRone (BUSPAR) 5 MG tablet Take 1 tablet (5 mg total) by mouth 3 (three) times daily. 30 tablet 3  . diclofenac (VOLTAREN) 75 MG EC tablet TAKE 1 TABLET BY MOUTH TWICE A DAY 180 tablet  1  . hydrOXYzine (ATARAX/VISTARIL) 50 MG tablet Take 1 tablet (50 mg total) by mouth at bedtime as needed. 30 tablet 2  . insulin aspart (NOVOLOG FLEXPEN) 100 UNIT/ML FlexPen Inject 75 Units into the skin 3 (three) times daily with meals. 30 units am and 50 units pm    .  Levothyroxine Sodium 137 MCG CAPS levothyroxine 137 mcg capsule  Take 2 capsules every day by oral route.    Marland Kitchen lisinopril-hydrochlorothiazide (PRINZIDE,ZESTORETIC) 20-12.5 MG tablet Take 1 tablet by mouth daily.    . metFORMIN (GLUCOPHAGE-XR) 500 MG 24 hr tablet Take 1,000 mg by mouth 2 (two) times daily.     Marland Kitchen Respiratory Therapy Supplies (ADULT MASK) MISC 2 Devices by Does not apply route once. 2 each 0  . simvastatin (ZOCOR) 20 MG tablet Take 20 mg by mouth daily.    Marland Kitchen topiramate (TOPAMAX) 50 MG tablet TAKE 2 TABLETS (100 MG TOTAL) BY MOUTH AT BEDTIME. 180 tablet 0  . TRESIBA FLEXTOUCH 200 UNIT/ML SOPN INJECT 100 UNITS TWICE A DAY  6  . BD ULTRA-FINE PEN NEEDLES 29G X 12.7MM MISC USE TWO CHECK BLOOD SUGAR TWICE A DAY. APPOINTMENT NEEDED FOR FURTHER REFILLS 100 each 0  . diltiazem (TIAZAC) 180 MG 24 hr capsule Take 180 mg by mouth daily.  6  . glucose blood (ONE TOUCH ULTRA TEST) test strip Two times a day for ULTRA 2 ONE TOUCH 100 each 1  . traZODone (DESYREL) 50 MG tablet Take 1 tablet (50 mg total) by mouth at bedtime as needed for sleep. 30 tablet 5   No current facility-administered medications for this visit.     No Known Allergies  Social History   Socioeconomic History  . Marital status: Married    Spouse name: Not on file  . Number of children: 2  . Years of education: BS  . Highest education level: Not on file  Occupational History  . Occupation: Software engineer: [employer]  Social Needs  . Financial resource strain: Not on file  . Food insecurity:    Worry: Not on file    Inability: Not on file  . Transportation needs:    Medical: Not on file    Non-medical: Not on file  Tobacco Use  . Smoking status: Former Smoker    Packs/day: 0.50    Years: 30.00    Pack years: 15.00    Last attempt to quit: 11/05/2012    Years since quitting: 6.0  . Smokeless tobacco: Never Used  . Tobacco comment: 30 years at most off and on smoking 03/14/15  Substance and Sexual  Activity  . Alcohol use: Yes    Alcohol/week: 0.0 standard drinks    Comment: 2 drinks per day  . Drug use: Yes    Comment: Occasional Marijuana use  . Sexual activity: Not on file  Lifestyle  . Physical activity:    Days per week: Not on file    Minutes per session: Not on file  . Stress: Not on file  Relationships  . Social connections:    Talks on phone: Not on file    Gets together: Not on file    Attends religious service: Not on file    Active member of club or organization: Not on file    Attends meetings of clubs or organizations: Not on file    Relationship status: Not on file  . Intimate partner violence:    Fear of current or ex partner: Not on  file    Emotionally abused: Not on file    Physically abused: Not on file    Forced sexual activity: Not on file  Other Topics Concern  . Not on file  Social History Narrative   Originally from [place]. Has lived in [place], [place], [place], & [place] since 1993. 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 Previously had a parrot (conure) 12 years ago for 1 year but in same house.  Has also had excessive exposure to pigeon feces. He has had very brief exposure to asbestos around 2000. No hot tub exposure. He has inhaled chemical fumes/gases/refridgerants through his work.      Marital status: married x 26 years      Children: 2 children (19, 13); no grandchildren      Lives: Lives at home with his wife      Employment: [employer]      Tobacco: none; quit in 2013; smoked x 1/2 ppd x 20 years      Alcohol:  10 drinks; usually 2 drinks per day and then skip two days.      Drugs:  None      Exercise: none; job is physically demanding      Right-handed.   1 cup caffeine daily.    Review of Systems  Constitutional: Negative.  Negative for chills and fever.  HENT: Negative for congestion and sore throat.   Eyes: Negative for blurred vision and double  vision.  Respiratory: Negative for cough and shortness of breath.   Cardiovascular: Negative.  Negative for chest pain and palpitations.  Gastrointestinal: Negative for abdominal pain, diarrhea, nausea and vomiting.  Genitourinary: Negative for dysuria and flank pain.  Musculoskeletal: Positive for back pain and joint pain.  Skin: Negative.   Neurological: Positive for tingling.  Psychiatric/Behavioral: The patient is nervous/anxious and has insomnia.     Objective   Vitals as reported by the patient: None available There were no vitals filed for this visit.  Diagnoses and all orders for this visit:  Situational anxiety -     ALPRAZolam (XANAX) 0.5 MG tablet; Take 1 tablet (0.5 mg total) by mouth daily as needed for anxiety.  Insomnia, unspecified type -     traZODone (DESYREL) 50 MG tablet; Take 1 tablet (50 mg total) by mouth at bedtime as needed for sleep.  Spinal stenosis of lumbar region, unspecified whether neurogenic claudication present  Type 2 diabetes mellitus with diabetic polyneuropathy, with long-term current use of insulin (HCC)  Primary osteoarthritis of both knees   FMLA and disability forms reviewed and completed.  Will fax them back to respective senders.  I discussed the assessment and treatment plan with the patient. The patient was provided an opportunity to ask questions and all were answered. The patient agreed with the plan and demonstrated an understanding of the instructions.   The patient was advised to call back or seek an in-person evaluation if the symptoms worsen or if the condition fails to improve as anticipated.  I provided 25 minutes of non-face-to-face time during this encounter.  Georgina Quint, MD  Primary Care at [facility]

## 2018-11-08 NOTE — Telephone Encounter (Signed)
Faxed completed documents to Eliezer Champagne and Merilynn Finland in Benefits at [facility]. Confirmation page received at 1:20 pm.

## 2018-11-21 ENCOUNTER — Ambulatory Visit: Payer: BC Managed Care – PPO | Admitting: Emergency Medicine

## 2018-11-21 ENCOUNTER — Other Ambulatory Visit: Payer: Self-pay | Admitting: Family Medicine

## 2018-12-11 ENCOUNTER — Telehealth: Payer: Self-pay | Admitting: Emergency Medicine

## 2018-12-11 NOTE — Telephone Encounter (Signed)
Pt dropped off disability paperwork. Placed in the providers box.

## 2018-12-13 NOTE — Telephone Encounter (Signed)
Merry Proud, Dr Alvy Bimler has the patient's paper work. I printed the previous ppw that is in media.

## 2018-12-14 ENCOUNTER — Other Ambulatory Visit: Payer: Self-pay | Admitting: Emergency Medicine

## 2018-12-14 DIAGNOSIS — G6289 Other specified polyneuropathies: Secondary | ICD-10-CM

## 2018-12-14 NOTE — Telephone Encounter (Signed)
Can this patient receive a refill on this medication? 

## 2018-12-20 ENCOUNTER — Other Ambulatory Visit: Payer: Self-pay | Admitting: *Deleted

## 2018-12-20 DIAGNOSIS — I10 Essential (primary) hypertension: Secondary | ICD-10-CM

## 2018-12-20 DIAGNOSIS — E1142 Type 2 diabetes mellitus with diabetic polyneuropathy: Secondary | ICD-10-CM

## 2018-12-20 DIAGNOSIS — Z794 Long term (current) use of insulin: Principal | ICD-10-CM

## 2018-12-20 DIAGNOSIS — E785 Hyperlipidemia, unspecified: Secondary | ICD-10-CM

## 2018-12-21 ENCOUNTER — Telehealth (INDEPENDENT_AMBULATORY_CARE_PROVIDER_SITE_OTHER): Payer: BC Managed Care – PPO | Admitting: Emergency Medicine

## 2018-12-21 ENCOUNTER — Encounter: Payer: Self-pay | Admitting: Emergency Medicine

## 2018-12-21 ENCOUNTER — Other Ambulatory Visit: Payer: Self-pay

## 2018-12-21 ENCOUNTER — Telehealth: Payer: Self-pay | Admitting: *Deleted

## 2018-12-21 DIAGNOSIS — E785 Hyperlipidemia, unspecified: Secondary | ICD-10-CM

## 2018-12-21 DIAGNOSIS — G479 Sleep disorder, unspecified: Secondary | ICD-10-CM

## 2018-12-21 DIAGNOSIS — Z794 Long term (current) use of insulin: Secondary | ICD-10-CM

## 2018-12-21 DIAGNOSIS — E1142 Type 2 diabetes mellitus with diabetic polyneuropathy: Secondary | ICD-10-CM | POA: Diagnosis not present

## 2018-12-21 DIAGNOSIS — M48061 Spinal stenosis, lumbar region without neurogenic claudication: Secondary | ICD-10-CM

## 2018-12-21 DIAGNOSIS — I10 Essential (primary) hypertension: Secondary | ICD-10-CM

## 2018-12-21 DIAGNOSIS — M170 Bilateral primary osteoarthritis of knee: Secondary | ICD-10-CM

## 2018-12-21 NOTE — Progress Notes (Signed)
Lab Results  Component Value Date   HGBA1C 6.5 (H) 09/22/2018      Telemedicine Encounter- SOAP NOTE Established Patient  This telephone encounter was conducted with the patient's (or proxy's) verbal consent via audio telecommunications: yes/no: Yes Patient was instructed to have this encounter in a suitably private space; and to only have persons present to whom they give permission to participate. In addition, patient identity was confirmed by use of name plus two identifiers (DOB and address).  I discussed the limitations, risks, security and privacy concerns of performing an evaluation and management service by telephone and the availability of in person appointments. I also discussed with the patient that there may be a patient responsible charge related to this service. The patient expressed understanding and agreed to proceed.  I spent a total of TIME; 0 MIN TO 60 MIN: 20 minutes talking with the patient or their proxy.  No chief complaint on file. Follow-up of diabetes and hypertension Subjective   Johnny Andrews is a 57 y.o. male established patient. Telephone visit today for follow-up on several chronic medical problems. 1.  Diabetes.  Sees endocrinologist and is on different type of insulins several times a day.  Last visit with endocrinologist on 11/06/2018 hemoglobin A1c was at 6.1.  Also has severe diabetic peripheral neuropathy. 2.  Hypertension.  Average blood pressure at home 140/81. 3.  Sleep disturbance.  States he goes to bed around 9:30 PM, takes trazodone 50 mg, wakes up at 3 in the morning with anxiety heart palpitations and heart pounding.  Takes alprazolam 0.5 mg, watches TV for 3 hours, and goes back to sleep, gets up at 10 in the morning. 4.  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  Recently filed for disability.  Paperwork filled out for him recently. 5.  Obstructive sleep apnea on CPAP.  Compliant.   Doing well. Problem list reviewed.  HPI   Patient Active Problem List   Diagnosis Date Noted  . Carpal tunnel syndrome of right wrist 04/24/2018  . Diabetic peripheral neuropathy (HCC) 04/24/2018  . Numbness of hand 04/18/2018  . Morbid obesity (HCC) 01/27/2018  . Osteoarthritis of left knee 12/16/2017  . Osteoarthritis of right knee 12/16/2017  . Family history of prostate cancer 03/06/2017  . Pure hypercholesterolemia 09/13/2016  . Lumbar degenerative disc disease 07/22/2016  . Spinal stenosis of lumbar region 11/03/2015  . Abnormality of gait 09/15/2015  . Restrictive lung disease 05/05/2015  . 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 Hypogonadism male 02/05/2013  . Elevated PSA 11/30/2011  . OTHER DISEASES OF LUNG NOT ELSEWHERE CLASSIFIED 07/20/2010  . GOUT 04/17/2010  . SMOKER 04/17/2010  . Sleep apnea 08/22/2009  . Alcohol abuse 10/24/2008  . Hypothyroidism 09/19/2007  . Diabetes (HCC) 09/19/2007  . Essential hypertension 09/19/2007  . OSTEOARTHRITIS 09/19/2007    Past Medical History:  Diagnosis Date  . ALCOHOL USE 10/24/2008  . ANXIETY 09/19/2007  . Cough 05/22/2008  . DIABETES MELLITUS, TYPE II 09/19/2007  . External thrombosed hemorrhoids 08/23/2008  . GOUT 04/17/2010  . HYPERLIPIDEMIA 09/19/2007  . HYPERTENSION 09/19/2007  . HYPOTHYROIDISM 09/19/2007  . Leg pain   . OSTEOARTHRITIS 09/19/2007  . OTHER DISEASES OF LUNG NOT ELSEWHERE CLASSIFIED 07/20/2010  . Rash and other nonspecific skin eruption 09/19/2007  . Restrictive lung disease   . SCROTAL ABSCESS 05/27/2010  . Sebaceous cyst 08/01/2008  . SLEEP APNEA 08/22/2009  . SMOKER 04/17/2010    Current Outpatient Medications  Medication  Sig Dispense Refill  . ALPRAZolam (XANAX) 0.5 MG tablet Take 1 tablet (0.5 mg total) by mouth daily as needed for anxiety. 30 tablet 1  . diclofenac (VOLTAREN) 75 MG EC tablet TAKE 1 TABLET BY MOUTH TWICE A DAY 180 tablet 1  . glucose blood (ONE TOUCH ULTRA TEST) test strip Two times a day  for ULTRA 2 ONE TOUCH 100 each 1  . insulin aspart (NOVOLOG FLEXPEN) 100 UNIT/ML FlexPen Inject 75 Units into the skin 3 (three) times daily with meals. 30 units am and 50 units pm    . Levothyroxine Sodium 137 MCG CAPS levothyroxine 137 mcg capsule  Take 2 capsules every day by oral route.    Marland Kitchen lisinopril-hydrochlorothiazide (PRINZIDE,ZESTORETIC) 20-12.5 MG tablet Take 1 tablet by mouth daily.    . metFORMIN (GLUCOPHAGE-XR) 500 MG 24 hr tablet Take 1,000 mg by mouth 2 (two) times daily.     Marland Kitchen Respiratory Therapy Supplies (ADULT MASK) MISC 2 Devices by Does not apply route once. 2 each 0  . simvastatin (ZOCOR) 20 MG tablet Take 20 mg by mouth daily.    Marland Kitchen topiramate (TOPAMAX) 50 MG tablet TAKE 2 TABLETS (100 MG TOTAL) BY MOUTH AT BEDTIME. 180 tablet 0  . traZODone (DESYREL) 50 MG tablet Take 1 tablet (50 mg total) by mouth at bedtime as needed for sleep. 30 tablet 5  . TRESIBA FLEXTOUCH 200 UNIT/ML SOPN INJECT 100 UNITS TWICE A DAY  6  . BD ULTRA-FINE PEN NEEDLES 29G X 12.7MM MISC USE TWO CHECK BLOOD SUGAR TWICE A DAY. APPOINTMENT NEEDED FOR FURTHER REFILLS 100 each 0  . diltiazem (TIAZAC) 180 MG 24 hr capsule Take 180 mg by mouth daily.  6   No current facility-administered medications for this visit.     No Known Allergies  Social History   Socioeconomic History  . Marital status: Married    Spouse name: Not on file  . Number of children: 2  . Years of education: BS  . Highest education level: Not on file  Occupational History  . Occupation: Software engineer: [employer]  Social Needs  . Financial resource strain: Not on file  . Food insecurity:    Worry: Not on file    Inability: Not on file  . Transportation needs:    Medical: Not on file    Non-medical: Not on file  Tobacco Use  . Smoking status: Former Smoker    Packs/day: 0.50    Years: 30.00    Pack years: 15.00    Last attempt to quit: 11/05/2012    Years since quitting: 6.1  . Smokeless tobacco: Never Used  .  Tobacco comment: 30 years at most off and on smoking 03/14/15  Substance and Sexual Activity  . Alcohol use: Yes    Alcohol/week: 0.0 standard drinks    Comment: 2 drinks per day  . Drug use: Yes    Comment: Occasional Marijuana use  . Sexual activity: Not on file  Lifestyle  . Physical activity:    Days per week: Not on file    Minutes per session: Not on file  . Stress: Not on file  Relationships  . Social connections:    Talks on phone: Not on file    Gets together: Not on file    Attends religious service: Not on file    Active member of club or organization: Not on file    Attends meetings of clubs or organizations: Not on file  Relationship status: Not on file  . Intimate partner violence:    Fear of current or ex partner: Not on file    Emotionally abused: Not on file    Physically abused: Not on file    Forced sexual activity: Not on file  Other Topics Concern  . Not on file  Social History Narrative   Originally from [place]. [place] lived in [place], [place], & [place] since 1993. 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 Currently has a dog & cats. Previously had a parrot (conure) 12 years ago for 1 year but in same house.  Has also had excessive exposure to pigeon feces. He has had very brief exposure to asbestos around 2000. No hot tub exposure. He has inhaled chemical fumes/gases/refridgerants through his work.      Marital status: married x 26 years      Children: 2 children (19, [id number]); no grandchildren      Lives: Lives at home with his wife      Employment: [employer]      Tobacco: none; quit in 2013; smoked x 1/2 ppd x 20 years      Alcohol:  10 drinks; usually 2 drinks per day and then skip two days.      Drugs:  None      Exercise: none; job is physically demanding      Right-handed.   1 cup caffeine daily.    Review of Systems  Constitutional: Negative.  Negative for chills and fever.  HENT:  Negative for congestion and sore throat.   Eyes: Negative.   Respiratory: Negative.  Negative for cough and shortness of breath.   Cardiovascular: Negative.  Negative for chest pain and palpitations.  Genitourinary: Negative.  Negative for dysuria and hematuria.  Skin: Negative.  Negative for rash.  Neurological: Negative for dizziness and headaches.  Psychiatric/Behavioral: The patient has insomnia.   All other systems reviewed and are negative.   Objective    Vitals as reported by the patient: Self reported blood pressure: Average: 140/81.  Self-reported weight: 379 pounds. Awake and oriented x3 in no apparent respiratory distress while talking to me on the phone. There were no vitals filed for this visit.  There are no diagnoses linked to this encounter. Diagnoses and all orders for this visit:  Type 2 diabetes mellitus with diabetic polyneuropathy, with long-term current use of insulin (HCC)  Essential hypertension  Dyslipidemia  Sleep disturbance  Spinal stenosis of lumbar region, unspecified whether neurogenic claudication present  Primary osteoarthritis of both knees  Morbid obesity (HCC)  Clinically stable.  No medical concerns identified during this visit. Continue present medications. Advised to increase bedtime trazodone dose to 100 mg nightly.  Continue alprazolam as needed. No longer taking hydroxyzine or BuSpar.  Changes noted. Office visit in 3 months. Disability forms recently filled out.  Patient informed about this.   I discussed the assessment and treatment plan with the patient. The patient was provided an opportunity to ask questions and all were answered. The patient agreed with the plan and demonstrated an understanding of the instructions.   The patient was advised to call back or seek an in-person evaluation if the symptoms worsen or if the condition fails to improve as anticipated.  I provided 20 minutes of non-face-to-face time during this  encounter.  Georgina QuintMiguel Jose Purnell Daigle, MD  Primary Care at [facility]

## 2018-12-21 NOTE — Telephone Encounter (Signed)
Faxed completed disability forms ATTN: Eliezer Champagne Benefits Manager [employer], per patient. Original copies to scan in patient chart and he will get a copy from Production designer, theatre/television/film. Confirmation page received at 2:37 pm.

## 2018-12-21 NOTE — Progress Notes (Signed)
Contacted patient to triage for appointment. Patient is following up on his diabetes and blood pressure. Patient states he does not need medication refills. Patient states glucose reading     yesterday was 170 at dinner and this morning before breakfast was 150. Patient states his blood pressure readings are 140/80, today before blood pressure medication 148/91 left arm and 138/71 right arm.

## 2019-01-03 ENCOUNTER — Telehealth: Payer: Self-pay | Admitting: Emergency Medicine

## 2019-01-03 NOTE — Telephone Encounter (Signed)
Copied from CRM [id number]. Topic: Quick Communication - Rx Refill/Question >> Jan 03, 2019 11:07 AM Jay Schlichter wrote: Medication:trazadone  Has the patient contacted their pharmacy? Yes.   (Agent: If no, request that the patient contact the pharmacy for the refill.) (Agent: If yes, when and what did the pharmacy advise?)  Preferred Pharmacy (with phone number or street name): [facility]  This was not filled at 12/21/18, pt was told to double the dose  He can not get refill of old rx until 01/07/19 Agent: Please be advised that RX refills may take up to 3 business days. We ask that you follow-up with your pharmacy.

## 2019-01-05 NOTE — Telephone Encounter (Signed)
Patient is out of medication and can't sleep without it. He's requesting that his request be exepidited.

## 2019-01-17 ENCOUNTER — Other Ambulatory Visit: Payer: Self-pay | Admitting: Emergency Medicine

## 2019-01-17 ENCOUNTER — Telehealth: Payer: Self-pay | Admitting: Emergency Medicine

## 2019-01-17 MED ORDER — TRAZODONE HCL 100 MG PO TABS: 100.0000 mg | ORAL_TABLET | Freq: Every day | ORAL | 1 refills | Status: DC

## 2019-01-17 NOTE — Telephone Encounter (Signed)
Copied from CRM [id number]. Topic: Quick Communication - Rx Refill/Question >> Jan 17, 2019  3:13 PM Dalphine Handing A wrote: Medication: traZODone (DESYREL) 100 MG tablet (Patient needs this prescription sent to his pharmacy as soon as possible.)  Has the patient contacted their pharmacy? Yes (Agent: If no, request that the patient contact the pharmacy for the refill.) (Agent: If yes, when and what did the pharmacy advise?)Contact PCP  Preferred Pharmacy (with phone number or street name): [id number] [place], [place] - [address] [phone] (Phone) [phone] (Fax)    Agent: Please be advised that RX refills may take up to 3 business days. We ask that you follow-up with your pharmacy.

## 2019-01-17 NOTE — Telephone Encounter (Signed)
Copied from CRM [id number]. Topic: General - Other >> Jan 17, 2019  3:17 PM Dalphine Handing A wrote: Patient is requesting a callback from Rosalee Kaufman today at [phone] in regards to his prescription request not being sent to his pharmacy for over 2 weeks now for the traZODone (DESYREL) 100 MG tablet.

## 2019-01-17 NOTE — Telephone Encounter (Signed)
Can he please get a refill of his trazodone? He states his dose was changed to take 2 tabs a day for sleep.

## 2019-01-17 NOTE — Telephone Encounter (Signed)
Prescription sent. Thanks

## 2019-01-17 NOTE — Telephone Encounter (Signed)
Will call and inform pt that Rx has been sent to pharmacy.

## 2019-01-17 NOTE — Telephone Encounter (Signed)
Johnny Andrews has spoke with pt and informed him also rx has been sent.

## 2019-01-30 ENCOUNTER — Telehealth: Payer: Self-pay | Admitting: Emergency Medicine

## 2019-01-30 NOTE — Telephone Encounter (Signed)
Pt dropped off Disability ppr work to be filled oput by Dr. Mitchel Honour / I collect 15.00 and placed ppr work in Chiropodist at nurses station FR

## 2019-01-30 NOTE — Telephone Encounter (Signed)
Caren Griffins please look into this disability paperwork.  We have done this twice already.  What is going on?  Thanks.

## 2019-01-31 ENCOUNTER — Other Ambulatory Visit: Payer: Self-pay | Admitting: Emergency Medicine

## 2019-01-31 ENCOUNTER — Other Ambulatory Visit: Payer: Self-pay | Admitting: Family Medicine

## 2019-01-31 DIAGNOSIS — F418 Other specified anxiety disorders: Secondary | ICD-10-CM

## 2019-01-31 NOTE — Telephone Encounter (Signed)
Please advise 

## 2019-02-02 NOTE — Telephone Encounter (Signed)
Please advise on controlled med refill  Last seen 12/21/18 telemed for DM  Last refilled 11/08/18 #30 with 1 refill  Rx is for situational anxiety

## 2019-02-02 NOTE — Telephone Encounter (Signed)
Pt called back in to follow up on his refill request for medication. Pt is upset because he says that he always have to go days without his medication before he is able to receive a refill. Pt is would like to know from provider what does he suggest?   Pt says that he allot the 48-72 hours turn around time.   Pt is also requesting a call back from the office manager.

## 2019-02-06 NOTE — Telephone Encounter (Signed)
Please advise pt is requesting call from office manager

## 2019-02-19 ENCOUNTER — Telehealth: Payer: Self-pay | Admitting: Emergency Medicine

## 2019-02-19 NOTE — Telephone Encounter (Signed)
Why are we doing this every month?

## 2019-02-19 NOTE — Telephone Encounter (Signed)
Pt dropped of disability paperwork that he brings every month

## 2019-02-23 NOTE — Telephone Encounter (Signed)
Called patient to let him know that his paperwork needs to go to [facility]. Will come by to pick up paperwork

## 2019-02-26 ENCOUNTER — Telehealth: Payer: Self-pay | Admitting: *Deleted

## 2019-02-26 NOTE — Telephone Encounter (Signed)
Faxed disability/FMLA completed forms to ATTN: Shane Crutch, Benefits Manager. Confirmation page received at 1:41 pm and attached to forms.

## 2019-03-05 ENCOUNTER — Ambulatory Visit: Payer: BC Managed Care – PPO

## 2019-03-14 ENCOUNTER — Other Ambulatory Visit: Payer: Self-pay | Admitting: Emergency Medicine

## 2019-03-14 DIAGNOSIS — G6289 Other specified polyneuropathies: Secondary | ICD-10-CM

## 2019-03-14 NOTE — Telephone Encounter (Signed)
Requested medication (s) are due for refill today: yes  Requested medication (s) are on the active medication list: yes  Last refill:  12/14/2018  Future visit scheduled: no  Notes to clinic:  Not delegated    Requested Prescriptions  Pending Prescriptions Disp Refills   topiramate (TOPAMAX) 50 MG tablet [Pharmacy Med Name: TOPIRAMATE 50 MG TABLET] 180 tablet 0    Sig: TAKE 2 TABLETS (100 MG TOTAL) BY MOUTH AT BEDTIME.     Not Delegated - Neurology: Anticonvulsants - topiramate & zonisamide Failed - 03/14/2019  1:30 AM      Failed - This refill cannot be delegated      Passed - Cr in normal range and within 360 days    Creat  Date Value Ref Range Status  09/01/2015 0.62 (L) 0.70 - 1.33 mg/dL Final   Creatinine, Ser  Date Value Ref Range Status  09/22/2018 0.78 0.76 - 1.27 mg/dL Final         Passed - CO2 in normal range and within 360 days    CO2  Date Value Ref Range Status  09/22/2018 22 20 - 29 mmol/L Final         Passed - Valid encounter within last 12 months    Recent Outpatient Visits          4 months ago Other polyneuropathy   [facility] at [facility], Ranell Patrick, MD   5 months ago Essential hypertension   Primary Care at [facility], [place], MD   8 months ago Ingrown toenail of left foot   Primary Care at [facility], [place], MD   10 months ago Type 2 diabetes mellitus with diabetic polyneuropathy, with long-term current use of insulin [facility])   Primary Care at [facility], Ines Bloomer, MD   1 year ago Cellulitis of buttock   Primary Care at [facility], Ranell Patrick, MD

## 2019-03-14 NOTE — Telephone Encounter (Signed)
Patient requesting topiramate 50mg 

## 2019-03-16 ENCOUNTER — Other Ambulatory Visit: Payer: Self-pay | Admitting: Emergency Medicine

## 2019-03-16 DIAGNOSIS — F418 Other specified anxiety disorders: Secondary | ICD-10-CM

## 2019-03-16 NOTE — Telephone Encounter (Signed)
Requested medications are due for refill today?  Yes  Requested medications are on the active medication list?  Yes  Last refill 02/04/2019  Future visit scheduled?  No  Notes to clinic   Requested Prescriptions  Pending Prescriptions Disp Refills   ALPRAZolam (XANAX) 0.5 MG tablet [Pharmacy Med Name: ALPRAZOLAM 0.5 MG TABLET] 20 tablet 1    Sig: TAKE 1 TABLET (0.5 MG TOTAL) BY MOUTH AT BEDTIME AS NEEDED FOR ANXIETY.     Not Delegated - Psychiatry:  Anxiolytics/Hypnotics Failed - 03/16/2019 11:28 AM      Failed - This refill cannot be delegated      Failed - Urine Drug Screen completed in last 360 days.      Passed - Valid encounter within last 6 months    Recent Outpatient Visits          4 months ago Other polyneuropathy   Primary Care at [facility], Ranell Patrick, MD   5 months ago Essential hypertension   Primary Care at [facility], [place], MD   8 months ago Ingrown toenail of left foot   Primary Care at [facility], [place], MD   10 months ago Type 2 diabetes mellitus with diabetic polyneuropathy, with long-term current use of insulin [facility])   Primary Care at [facility], Ines Bloomer, MD   1 year ago Cellulitis of buttock   Primary Care at [facility], Ranell Patrick, MD

## 2019-03-16 NOTE — Telephone Encounter (Signed)
Pt called in to follow up on his refill request for ALPRAZolam Duanne Moron) 0.5 MG tablet

## 2019-03-20 ENCOUNTER — Other Ambulatory Visit: Payer: Self-pay | Admitting: Emergency Medicine

## 2019-03-20 ENCOUNTER — Telehealth: Payer: Self-pay | Admitting: Emergency Medicine

## 2019-03-20 DIAGNOSIS — F418 Other specified anxiety disorders: Secondary | ICD-10-CM

## 2019-03-20 MED ORDER — ALPRAZOLAM 0.5 MG PO TABS: 0.5000 mg | ORAL_TABLET | Freq: Every day | ORAL | 1 refills | Status: DC | PRN

## 2019-03-20 NOTE — Telephone Encounter (Signed)
Done. Thanks.

## 2019-03-20 NOTE — Telephone Encounter (Signed)
Pt is needing refill called in forALPRAZolam (XANAX) 0.5 MG tablet  pt only has 3 tablets left   FR

## 2019-03-20 NOTE — Telephone Encounter (Signed)
Pt requesting xanax 0.5 mg refill pt advises he has only 3 pills left.  Last ov 12/21/2018 telemed for type 2 diabetes.  No upcoming appts.  Pt seen for situational anxiety on 11/08/2018.  Please advise Dgaddy, CMA

## 2019-04-02 NOTE — Telephone Encounter (Signed)
Disability forms completed and faxed to Shane Crutch at [phone] with confirmation. Orginal has been placed in scanning and copy is placed in provider box nurse station.

## 2019-04-24 ENCOUNTER — Telehealth: Payer: Self-pay | Admitting: Emergency Medicine

## 2019-04-24 NOTE — Telephone Encounter (Signed)
disability ppr work put in Chiropodist at [place] 15.00 collected [employer]

## 2019-05-03 ENCOUNTER — Telehealth: Payer: Self-pay | Admitting: *Deleted

## 2019-05-03 NOTE — Telephone Encounter (Signed)
Called mobile number left voice message to call back concerning FMLA forms.

## 2019-05-03 NOTE — Telephone Encounter (Signed)
Patient returned call concerning the disability forms, because the provider is receiving this form each month. Patient states the form has to be completed once a month. Patient states he does not know the reason, he will contact them to check and let me know.

## 2019-05-03 NOTE — Telephone Encounter (Signed)
Faxed completed forms to Attn: Clearence Cheek. confirmation page received at 2:46 pm.

## 2019-05-10 ENCOUNTER — Other Ambulatory Visit: Payer: Self-pay | Admitting: Emergency Medicine

## 2019-05-10 ENCOUNTER — Other Ambulatory Visit: Payer: Self-pay | Admitting: Family Medicine

## 2019-05-10 DIAGNOSIS — G4700 Insomnia, unspecified: Secondary | ICD-10-CM

## 2019-05-18 ENCOUNTER — Other Ambulatory Visit: Payer: Self-pay | Admitting: Emergency Medicine

## 2019-05-18 DIAGNOSIS — F418 Other specified anxiety disorders: Secondary | ICD-10-CM

## 2019-05-18 NOTE — Telephone Encounter (Signed)
Requested medication (s) are due for refill today: yes  Requested medication (s) are on the active medication list: yes  Last refill:  04/18/2019  Future visit scheduled: no  Notes to clinic:  Refill cannot be delegated    Requested Prescriptions  Pending Prescriptions Disp Refills   ALPRAZolam (XANAX) 0.5 MG tablet [Pharmacy Med Name: ALPRAZOLAM 0.5 MG TABLET] 20 tablet 1    Sig: TAKE 1 TABLET BY MOUTH EVERY DAY AS NEEDED FOR ANXIETY     Not Delegated - Psychiatry:  Anxiolytics/Hypnotics Failed - 05/18/2019 10:16 AM      Failed - This refill cannot be delegated      Failed - Urine Drug Screen completed in last 360 days.      Passed - Valid encounter within last 6 months    Recent Outpatient Visits          4 months ago Type 2 diabetes mellitus with diabetic polyneuropathy, with long-term current use of insulin [facility])   Primary Care at [facility], Ines Bloomer, MD   6 months ago Situational anxiety   Primary Care at [place], Ines Bloomer, MD   6 months ago Other polyneuropathy   Primary Care at [place], MD   7 months ago Essential hypertension   Primary Care at [facility], Arlie Solomons, MD   10 months ago Ingrown toenail of left foot   Primary Care at [facility], Ines Bloomer, MD

## 2019-05-29 ENCOUNTER — Telehealth: Payer: Self-pay | Admitting: *Deleted

## 2019-05-29 NOTE — Telephone Encounter (Signed)
Faxed short-term disability form to Attn: Clearence Cheek at [employer]. Confirmation page at 1:21 pm.

## 2019-06-11 ENCOUNTER — Other Ambulatory Visit: Payer: Self-pay | Admitting: Emergency Medicine

## 2019-06-11 DIAGNOSIS — G6289 Other specified polyneuropathies: Secondary | ICD-10-CM

## 2019-06-11 NOTE — Telephone Encounter (Signed)
Requested medication (s) are due for refill today: yes  Requested medication (s) are on the active medication list: yes  Last refill:  03/14/2019  Future visit scheduled: no  Notes to clinic:  Refill cannot be delegated    Requested Prescriptions  Pending Prescriptions Disp Refills   topiramate (TOPAMAX) 50 MG tablet [Pharmacy Med Name: TOPIRAMATE 50 MG TABLET] 180 tablet 0    Sig: TAKE 2 TABLETS (100 MG TOTAL) BY MOUTH AT BEDTIME.     Not Delegated - Neurology: Anticonvulsants - topiramate & zonisamide Failed - 06/11/2019  1:46 AM      Failed - This refill cannot be delegated      Passed - Cr in normal range and within 360 days    Creat  Date Value Ref Range Status  09/01/2015 0.62 (L) 0.70 - 1.33 mg/dL Final   Creatinine, Ser  Date Value Ref Range Status  09/22/2018 0.78 0.76 - 1.27 mg/dL Final         Passed - CO2 in normal range and within 360 days    CO2  Date Value Ref Range Status  09/22/2018 22 20 - 29 mmol/L Final         Passed - Valid encounter within last 12 months    Recent Outpatient Visits          5 months ago Type 2 diabetes mellitus with diabetic polyneuropathy, with long-term current use of insulin [facility])   [facility] at [facility], Ines Bloomer, MD   7 months ago Situational anxiety   Primary Care at [place], Ines Bloomer, MD   7 months ago Other polyneuropathy   Primary Care at [place], MD   8 months ago Essential hypertension   Primary Care at [facility], Arlie Solomons, MD   11 months ago Ingrown toenail of left foot   Primary Care at [facility], Ines Bloomer, MD

## 2019-06-16 ENCOUNTER — Other Ambulatory Visit: Payer: Self-pay | Admitting: Emergency Medicine

## 2019-06-16 DIAGNOSIS — G4700 Insomnia, unspecified: Secondary | ICD-10-CM

## 2019-06-16 NOTE — Telephone Encounter (Signed)
Requested medication (s) are due for refill today: no  Requested medication (s) are on the active medication list: no  Last refill:  09/08/2018  Future visit scheduled: no  Notes to clinic:  Medication was discontinued Review for refill   Requested Prescriptions  Pending Prescriptions Disp Refills   hydrOXYzine (ATARAX/VISTARIL) 50 MG tablet [Pharmacy Med Name: HYDROXYZINE HCL 50 MG TABLET] 30 tablet 2    Sig: Take 1 tablet (50 mg total) by mouth at bedtime as needed.     Ear, Nose, and Throat:  Antihistamines Passed - 06/16/2019 10:26 AM      Passed - Valid encounter within last 12 months    Recent Outpatient Visits          5 months ago Type 2 diabetes mellitus with diabetic polyneuropathy, with long-term current use of insulin [facility])   Primary Care at [facility], Ines Bloomer, MD   7 months ago Situational anxiety   Primary Care at [place], Ines Bloomer, MD   7 months ago Other polyneuropathy   Primary Care at [place], MD   8 months ago Essential hypertension   Primary Care at [facility], Arlie Solomons, MD   11 months ago Ingrown toenail of left foot   Primary Care at [facility], Ines Bloomer, MD

## 2019-06-20 ENCOUNTER — Telehealth: Payer: Self-pay | Admitting: *Deleted

## 2019-06-20 NOTE — Telephone Encounter (Signed)
Sent to scheduling pool.

## 2019-07-10 ENCOUNTER — Other Ambulatory Visit: Payer: Self-pay | Admitting: Emergency Medicine

## 2019-07-10 DIAGNOSIS — F418 Other specified anxiety disorders: Secondary | ICD-10-CM

## 2019-07-10 NOTE — Telephone Encounter (Signed)
Requested medication (s) are due for refill today: yes  Requested medication (s) are on the active medication list: yes  Last refill:  04/16/2019  Future visit scheduled: no  Notes to clinic: overdue for office visit  Review for refill   Requested Prescriptions  Pending Prescriptions Disp Refills   traZODone (DESYREL) 100 MG tablet [Pharmacy Med Name: TRAZODONE 100 MG TABLET] 90 tablet 1    Sig: TAKE 1 TABLET BY MOUTH EVERYDAY AT BEDTIME     Psychiatry: Antidepressants - Serotonin Modulator Failed - 07/10/2019  8:32 AM      Failed - Valid encounter within last 6 months    Recent Outpatient Visits          6 months ago Type 2 diabetes mellitus with diabetic polyneuropathy, with long-term current use of insulin [facility])   Primary Care at [facility], Ines Bloomer, MD   8 months ago Situational anxiety   Primary Care at [place], [place], MD   8 months ago Other polyneuropathy   Primary Care at [facility], Ranell Patrick, MD   9 months ago Essential hypertension   Primary Care at [facility], Arlie Solomons, MD   1 year ago Ingrown toenail of left foot   Primary Care at [facility], [place], MD             Failed - Completed PHQ-2 or PHQ-9 in the last 360 days.

## 2019-07-10 NOTE — Telephone Encounter (Signed)
Requested medication (s) are due for refill today: yes  Requested medication (s) are on the active medication list: yes  Last refill:  06/18/2019  Future visit scheduled: no  Notes to clinic:  Refill cannot be delegated    Requested Prescriptions  Pending Prescriptions Disp Refills   ALPRAZolam (XANAX) 0.5 MG tablet [Pharmacy Med Name: ALPRAZOLAM 0.5 MG TABLET] 20 tablet 1    Sig: TAKE 1 TABLET BY MOUTH EVERY DAY AS NEEDED FOR ANXIETY     Not Delegated - Psychiatry:  Anxiolytics/Hypnotics Failed - 07/10/2019  2:07 PM      Failed - This refill cannot be delegated      Failed - Urine Drug Screen completed in last 360 days.      Failed - Valid encounter within last 6 months    Recent Outpatient Visits          6 months ago Type 2 diabetes mellitus with diabetic polyneuropathy, with long-term current use of insulin [facility])   Primary Care at [facility], Ines Bloomer, MD   8 months ago Situational anxiety   Primary Care at [place], Ines Bloomer, MD   8 months ago Other polyneuropathy   Primary Care at [place], MD   9 months ago Essential hypertension   Primary Care at [facility], Arlie Solomons, MD   1 year ago Ingrown toenail of left foot   Primary Care at [facility], Ines Bloomer, MD

## 2019-07-11 NOTE — Telephone Encounter (Signed)
Please schedule an appt to be seen for any refills last seen back in May. Ok to be a tele-med visit

## 2019-07-17 ENCOUNTER — Other Ambulatory Visit: Payer: Self-pay | Admitting: Emergency Medicine

## 2019-07-17 DIAGNOSIS — G6289 Other specified polyneuropathies: Secondary | ICD-10-CM

## 2019-07-17 NOTE — Telephone Encounter (Signed)
Requested medication (s) are due for refill today: yes  Requested medication (s) are on the active medication list: yes  Last refill:  06/20/2019  Future visit scheduled: no  Notes to clinic:  Refill cannot be delegated     Requested Prescriptions  Pending Prescriptions Disp Refills   topiramate (TOPAMAX) 50 MG tablet [Pharmacy Med Name: TOPIRAMATE 50 MG TABLET] 60 tablet 0    Sig: TAKE 2 TABLETS (100 MG TOTAL) BY MOUTH AT BEDTIME.     Not Delegated - Neurology: Anticonvulsants - topiramate & zonisamide Failed - 07/17/2019 10:17 AM      Failed - This refill cannot be delegated      Passed - Cr in normal range and within 360 days    Creat  Date Value Ref Range Status  09/01/2015 0.62 (L) 0.70 - 1.33 mg/dL Final   Creatinine, Ser  Date Value Ref Range Status  09/22/2018 0.78 0.76 - 1.27 mg/dL Final         Passed - CO2 in normal range and within 360 days    CO2  Date Value Ref Range Status  09/22/2018 22 20 - 29 mmol/L Final         Passed - Valid encounter within last 12 months    Recent Outpatient Visits          6 months ago Type 2 diabetes mellitus with diabetic polyneuropathy, with long-term current use of insulin [facility])   Primary Care at [facility], Ines Bloomer, MD   8 months ago Situational anxiety   Primary Care at [place], [place], MD   8 months ago Other polyneuropathy   Primary Care at [place], MD   9 months ago Essential hypertension   Primary Care at [facility], Arlie Solomons, MD   1 year ago Ingrown toenail of left foot   Primary Care at [facility], Ines Bloomer, MD

## 2019-07-19 ENCOUNTER — Telehealth: Payer: Self-pay | Admitting: Emergency Medicine

## 2019-07-19 NOTE — Telephone Encounter (Signed)
[email or web address]

## 2019-07-20 NOTE — Telephone Encounter (Signed)
[email or web address]

## 2019-07-24 ENCOUNTER — Ambulatory Visit: Payer: BC Managed Care – PPO | Admitting: Emergency Medicine

## 2019-07-25 ENCOUNTER — Other Ambulatory Visit: Payer: Self-pay

## 2019-07-25 ENCOUNTER — Ambulatory Visit: Payer: BC Managed Care – PPO | Admitting: Emergency Medicine

## 2019-07-25 ENCOUNTER — Encounter: Payer: Self-pay | Admitting: Emergency Medicine

## 2019-07-25 VITALS — BP 149/80 | HR 80 | Temp 98.7°F | Resp 16 | Ht 75.0 in | Wt >= 6400 oz

## 2019-07-25 DIAGNOSIS — G4733 Obstructive sleep apnea (adult) (pediatric): Secondary | ICD-10-CM

## 2019-07-25 DIAGNOSIS — E1169 Type 2 diabetes mellitus with other specified complication: Secondary | ICD-10-CM

## 2019-07-25 DIAGNOSIS — E785 Hyperlipidemia, unspecified: Secondary | ICD-10-CM

## 2019-07-25 DIAGNOSIS — I10 Essential (primary) hypertension: Secondary | ICD-10-CM

## 2019-07-25 DIAGNOSIS — L989 Disorder of the skin and subcutaneous tissue, unspecified: Secondary | ICD-10-CM

## 2019-07-25 DIAGNOSIS — E1159 Type 2 diabetes mellitus with other circulatory complications: Secondary | ICD-10-CM | POA: Diagnosis not present

## 2019-07-25 DIAGNOSIS — M8949 Other hypertrophic osteoarthropathy, multiple sites: Secondary | ICD-10-CM

## 2019-07-25 DIAGNOSIS — F418 Other specified anxiety disorders: Secondary | ICD-10-CM | POA: Diagnosis not present

## 2019-07-25 DIAGNOSIS — M159 Polyosteoarthritis, unspecified: Secondary | ICD-10-CM

## 2019-07-25 DIAGNOSIS — I872 Venous insufficiency (chronic) (peripheral): Secondary | ICD-10-CM

## 2019-07-25 DIAGNOSIS — E1142 Type 2 diabetes mellitus with diabetic polyneuropathy: Secondary | ICD-10-CM

## 2019-07-25 DIAGNOSIS — I152 Hypertension secondary to endocrine disorders: Secondary | ICD-10-CM

## 2019-07-25 MED ORDER — ALPRAZOLAM 0.5 MG PO TABS: ORAL_TABLET | ORAL | 1 refills | Status: DC

## 2019-07-25 NOTE — Patient Instructions (Addendum)
   If you have lab work done today you will be contacted with your lab results within the next 2 weeks.  If you have not heard from us then please contact us. The fastest way to get your results is to register for My Chart.   IF you received an x-ray today, you will receive an invoice from [facility]. Please contact [facility] at [phone] with questions or concerns regarding your invoice.   IF you received labwork today, you will receive an invoice from [facility]. Please contact [facility] at [phone] with questions or concerns regarding your invoice.   Our billing staff will not be able to assist you with questions regarding bills from these companies.  You will be contacted with the lab results as soon as they are available. The fastest way to get your results is to activate your My Chart account. Instructions are located on the last page of this paperwork. If you have not heard from us regarding the results in 2 weeks, please contact this office.     Diabetes Mellitus and Nutrition, Adult When you have diabetes (diabetes mellitus), it is very important to have healthy eating habits because your blood sugar (glucose) levels are greatly affected by what you eat and drink. Eating healthy foods in the appropriate amounts, at about the same times every day, can help you:  Control your blood glucose.  Lower your risk of heart disease.  Improve your blood pressure.  Reach or maintain a healthy weight. Every person with diabetes is different, and each person has different needs for a meal plan. Your health care provider may recommend that you work with a diet and nutrition specialist (dietitian) to make a meal plan that is best for you. Your meal plan may vary depending on factors such as:  The calories you need.  The medicines you take.  Your weight.  Your blood glucose, blood pressure, and cholesterol levels.  Your activity level.  Other health conditions  you have, such as heart or kidney disease. How do carbohydrates affect me? Carbohydrates, also called carbs, affect your blood glucose level more than any other type of food. Eating carbs naturally raises the amount of glucose in your blood. Carb counting is a method for keeping track of how many carbs you eat. Counting carbs is important to keep your blood glucose at a healthy level, especially if you use insulin or take certain oral diabetes medicines. It is important to know how many carbs you can safely have in each meal. This is different for every person. Your dietitian can help you calculate how many carbs you should have at each meal and for each snack. Foods that contain carbs include:  Bread, cereal, rice, pasta, and crackers.  Potatoes and corn.  Peas, beans, and lentils.  Milk and yogurt.  Fruit and juice.  Desserts, such as cakes, cookies, ice cream, and candy. How does alcohol affect me? Alcohol can cause a sudden decrease in blood glucose (hypoglycemia), especially if you use insulin or take certain oral diabetes medicines. Hypoglycemia can be a life-threatening condition. Symptoms of hypoglycemia (sleepiness, dizziness, and confusion) are similar to symptoms of having too much alcohol. If your health care provider says that alcohol is safe for you, follow these guidelines:  Limit alcohol intake to no more than 1 drink per day for nonpregnant women and 2 drinks per day for men. One drink equals 12 oz of beer, 5 oz of wine, or 1 oz of hard liquor.    Do not drink on an empty stomach.  Keep yourself hydrated with water, diet soda, or unsweetened iced tea.  Keep in mind that regular soda, juice, and other mixers may contain a lot of sugar and must be counted as carbs. What are tips for following this plan?  Reading food labels  Start by checking the serving size on the "Nutrition Facts" label of packaged foods and drinks. The amount of calories, carbs, fats, and other  nutrients listed on the label is based on one serving of the item. Many items contain more than one serving per package.  Check the total grams (g) of carbs in one serving. You can calculate the number of servings of carbs in one serving by dividing the total carbs by 15. For example, if a food has 30 g of total carbs, it would be equal to 2 servings of carbs.  Check the number of grams (g) of saturated and trans fats in one serving. Choose foods that have low or no amount of these fats.  Check the number of milligrams (mg) of salt (sodium) in one serving. Most people should limit total sodium intake to less than 2,300 mg per day.  Always check the nutrition information of foods labeled as "low-fat" or "nonfat". These foods may be higher in added sugar or refined carbs and should be avoided.  Talk to your dietitian to identify your daily goals for nutrients listed on the label. Shopping  Avoid buying canned, premade, or processed foods. These foods tend to be high in fat, sodium, and added sugar.  Shop around the outside edge of the grocery store. This includes fresh fruits and vegetables, bulk grains, fresh meats, and fresh dairy. Cooking  Use low-heat cooking methods, such as baking, instead of high-heat cooking methods like deep frying.  Cook using healthy oils, such as olive, canola, or sunflower oil.  Avoid cooking with butter, cream, or high-fat meats. Meal planning  Eat meals and snacks regularly, preferably at the same times every day. Avoid going long periods of time without eating.  Eat foods high in fiber, such as fresh fruits, vegetables, beans, and whole grains. Talk to your dietitian about how many servings of carbs you can eat at each meal.  Eat 4-6 ounces (oz) of lean protein each day, such as lean meat, chicken, fish, eggs, or tofu. One oz of lean protein is equal to: ? 1 oz of meat, chicken, or fish. ? 1 egg. ?  cup of tofu.  Eat some foods each day that contain  healthy fats, such as avocado, nuts, seeds, and fish. Lifestyle  Check your blood glucose regularly.  Exercise regularly as told by your health care provider. This may include: ? 150 minutes of moderate-intensity or vigorous-intensity exercise each week. This could be brisk walking, biking, or water aerobics. ? Stretching and doing strength exercises, such as yoga or weightlifting, at least 2 times a week.  Take medicines as told by your health care provider.  Do not use any products that contain nicotine or tobacco, such as cigarettes and e-cigarettes. If you need help quitting, ask your health care provider.  Work with a counselor or diabetes educator to identify strategies to manage stress and any emotional and social challenges. Questions to ask a health care provider  Do I need to meet with a diabetes educator?  Do I need to meet with a dietitian?  What number can I call if I have questions?  When are the best times to   check my blood glucose? Where to find more information:  American Diabetes Association: diabetes.org  Academy of Nutrition and Dietetics: www.eatright.[employer] of Diabetes and Digestive and Kidney Diseases (NIH): [employer] Summary  A healthy meal plan will help you control your blood glucose and maintain a healthy lifestyle.  Working with a diet and nutrition specialist (dietitian) can help you make a meal plan that is best for you.  Keep in mind that carbohydrates (carbs) and alcohol have immediate effects on your blood glucose levels. It is important to count carbs and to use alcohol carefully. This information is not intended to replace advice given to you by your health care provider. Make sure you discuss any questions you have with your health care provider. Document Released: 04/29/2005 Document Revised: 07/15/2017 Document Reviewed: 09/06/2016 Elsevier Patient Education  2020 [place].  Chronic Venous  Insufficiency Chronic venous insufficiency is a condition where the leg veins cannot effectively pump blood from the legs to the heart. This happens when the vein walls are either stretched, weakened, or damaged, or when the valves inside the vein are damaged. With the right treatment, you should be able to continue with an active life. This condition is also called venous stasis. What are the causes? Common causes of this condition include:  High blood pressure inside the veins (venous hypertension).  Sitting or standing too long, causing increased blood pressure in the leg veins.  A blood clot that blocks blood flow in a vein (deep vein thrombosis, DVT).  Inflammation of a vein (phlebitis) that causes a blood clot to form.  Tumors in the pelvis that cause blood to back up. What increases the risk? The following factors may make you more likely to develop this condition:  Having a family history of this condition.  Obesity.  Pregnancy.  Living without enough regular physical activity or exercise (sedentary lifestyle).  Smoking.  Having a job that requires long periods of standing or sitting in one place.  Being a certain age. Women in their 105s and 6s and men in their 64s are more likely to develop this condition. What are the signs or symptoms? Symptoms of this condition include:  Veins that are enlarged, bulging, or twisted (varicose veins).  Skin breakdown or ulcers.  Reddened skin or dark discoloration of skin on the leg between the knee and ankle.  Brown, smooth, tight, and painful skin just above the ankle, usually on the inside of the leg (lipodermatosclerosis).  Swelling of the legs. How is this diagnosed? This condition may be diagnosed based on:  Your medical history.  A physical exam.  Tests, such as: ? A procedure that creates an image of a blood vessel and nearby organs and provides information about blood flow through the blood vessel (duplex  ultrasound). ? A procedure that tests blood flow (plethysmography). ? A procedure that looks at the veins using X-ray and dye (venogram). How is this treated? The goals of treatment are to help you return to an active life and to minimize pain or disability. Treatment depends on the severity of your condition, and it may include:  Wearing compression stockings. These can help relieve symptoms and help prevent your condition from getting worse. However, they do not cure the condition.  Sclerotherapy. This procedure involves an injection of a solution that shrinks damaged veins.  Surgery. This may involve: ? Removing a diseased vein (vein stripping). ? Cutting off blood flow through the vein (laser ablation surgery). ? Repairing or reconstructing  a valve within the affected vein. Follow these instructions at home:      Wear compression stockings as told by your health care provider. These stockings help to prevent blood clots and reduce swelling in your legs.  Take over-the-counter and prescription medicines only as told by your health care provider.  Stay active by exercising, walking, or doing different activities. Ask your health care provider what activities are safe for you and how much exercise you need.  Drink enough fluid to keep your urine pale yellow.  Do not use any products that contain nicotine or tobacco, such as cigarettes, e-cigarettes, and chewing tobacco. If you need help quitting, ask your health care provider.  Keep all follow-up visits as told by your health care provider. This is important. Contact a health care provider if you:  Have redness, swelling, or more pain in the affected area.  See a red streak or line that goes up or down from the affected area.  Have skin breakdown or skin loss in the affected area, even if the breakdown is small.  Get an injury in the affected area. Get help right away if:  You get an injury and an open wound in the affected  area.  You have: ? Severe pain that does not get better with medicine. ? Sudden numbness or weakness in the foot or ankle below the affected area. ? Trouble moving your foot or ankle. ? A fever. ? Worse or persistent symptoms. ? Chest pain. ? Shortness of breath. Summary  Chronic venous insufficiency is a condition where the leg veins cannot effectively pump blood from the legs to the heart.  Chronic venous insufficiency occurs when the vein walls become stretched, weakened, or damaged, or when valves within the vein are damaged.  Treatment depends on how severe your condition is. It often involves wearing compression stockings and may involve having a procedure.  Make sure you stay active by exercising, walking, or doing different activities. Ask your health care provider what activities are safe for you and how much exercise you need. This information is not intended to replace advice given to you by your health care provider. Make sure you discuss any questions you have with your health care provider. Document Released: 12/06/2006 Document Revised: 04/25/2018 Document Reviewed: 04/25/2018 Elsevier Patient Education  2020 [employer].

## 2019-07-25 NOTE — Progress Notes (Signed)
Johnny Andrews 58 y.o.   Chief Complaint  Patient presents with   Nevus    on RIGHT side of head   Medication Refill    XANAX   Leg Swelling    BOTH  x 2 weeks    HISTORY OF PRESENT ILLNESS: This is a 58 y.o. male with history of diabetes and hypertension complaining of scalp skin lesion and intermittent peripheral edema for the past 2 weeks.  No other significant symptoms. Also requesting medication refill for alprazolam. BP Readings from Last 3 Encounters:  07/25/19 (!) 149/80  10/25/18 118/72  09/22/18 140/60   Wt Readings from Last 3 Encounters:  07/25/19 (!) 407 lb (184.6 kg)  10/25/18 (!) 378 lb 3.2 oz (171.6 kg)  09/22/18 (!) 380 lb 14.4 oz (172.8 kg)    HPI   Prior to Admission medications   Medication Sig Start Date End Date Taking? Authorizing Provider  ALPRAZolam Prudy Feeler) 0.5 MG tablet TAKE 1 TABLET BY MOUTH EVERY DAY AS NEEDED FOR ANXIETY 07/25/19  Yes Tavia Stave, Eilleen Kempf, MD  diclofenac (VOLTAREN) 75 MG EC tablet TAKE 1 TABLET BY MOUTH TWICE A DAY 12/21/17  Yes Ethelda Chick, MD  diltiazem [facility]) 180 MG 24 hr capsule Take 180 mg by mouth daily. 02/10/17  Yes [provider]  insulin aspart (NOVOLOG FLEXPEN) 100 UNIT/ML FlexPen Inject 75 Units into the skin 3 (three) times daily with meals. 30 units am and 50 units pm   Yes [provider]  Levothyroxine Sodium 137 MCG CAPS levothyroxine 137 mcg capsule  Take 2 capsules every day by oral route.   Yes [provider]  lisinopril-hydrochlorothiazide (PRINZIDE,ZESTORETIC) 20-12.5 MG tablet Take 1 tablet by mouth daily.   Yes [provider]  metFORMIN (GLUCOPHAGE-XR) 500 MG 24 hr tablet Take 1,000 mg by mouth 2 (two) times daily.  09/15/15  Yes [provider]  simvastatin (ZOCOR) 20 MG tablet Take 20 mg by mouth daily.   Yes [provider]  topiramate (TOPAMAX) 50 MG tablet TAKE 2 TABLETS (100 MG TOTAL) BY MOUTH AT BEDTIME. 07/18/19  Yes Indie Nickerson, Eilleen Kempf, MD  TRESIBA FLEXTOUCH 200 UNIT/ML SOPN INJECT 100 UNITS TWICE A DAY 12/17/16  Yes [provider]  BD ULTRA-FINE PEN NEEDLES 29G X 12.7MM MISC USE TWO CHECK BLOOD SUGAR TWICE A DAY. APPOINTMENT NEEDED FOR FURTHER REFILLS 03/09/16   Reather Littler, MD  glucose blood (ONE TOUCH ULTRA TEST) test strip Two times a day for ULTRA 2 ONE TOUCH 04/19/18   Georgina Quint, MD  Respiratory Therapy Supplies (ADULT MASK) MISC 2 Devices by Does not apply route once. 11/24/11   Oretha Milch, MD  traZODone (DESYREL) 100 MG tablet Take 1 tablet (100 mg total) by mouth at bedtime. 01/17/19 04/17/19  Georgina Quint, MD    No Known Allergies  Patient Active Problem List   Diagnosis Date Noted   Carpal tunnel syndrome of right wrist 04/24/2018   Diabetic peripheral neuropathy (HCC) 04/24/2018   Morbid obesity (HCC) 01/27/2018   Osteoarthritis of left knee 12/16/2017   Osteoarthritis of right knee 12/16/2017   Family history of prostate cancer 03/06/2017   Pure hypercholesterolemia 09/13/2016   Lumbar degenerative disc disease 07/22/2016   Spinal stenosis of lumbar region 11/03/2015   Abnormality of gait 09/15/2015   Restrictive lung disease 05/05/2015   Impotence of organic origin 02/05/2013   Hypogonadism male 02/05/2013   Elevated PSA 11/30/2011   OTHER DISEASES OF LUNG NOT ELSEWHERE CLASSIFIED  07/20/2010   GOUT 04/17/2010   SMOKER 04/17/2010   Sleep apnea 08/22/2009   Hypothyroidism 09/19/2007   Diabetes (HCC) 09/19/2007   Essential hypertension 09/19/2007   OSTEOARTHRITIS 09/19/2007    Past Medical History:  Diagnosis Date   ALCOHOL USE 10/24/2008   ANXIETY 09/19/2007   Cough 05/22/2008   DIABETES MELLITUS, TYPE II 09/19/2007   External thrombosed hemorrhoids 08/23/2008   GOUT 04/17/2010   HYPERLIPIDEMIA 09/19/2007   HYPERTENSION 09/19/2007   HYPOTHYROIDISM 09/19/2007   Leg pain    OSTEOARTHRITIS 09/19/2007   OTHER DISEASES OF LUNG NOT ELSEWHERE  CLASSIFIED 07/20/2010   Rash and other nonspecific skin eruption 09/19/2007   Restrictive lung disease    SCROTAL ABSCESS 05/27/2010   Sebaceous cyst 08/01/2008   SLEEP APNEA 08/22/2009   SMOKER 04/17/2010    Past Surgical History:  Procedure Laterality Date   COLONOSCOPY     NASAL SEPTUM SURGERY      Social History   Socioeconomic History   Marital status: Married    Spouse name: Not on file   Number of children: 2   Years of education: BS   Highest education level: Not on file  Occupational History   Occupation: Software engineer: [employer]  Social Needs   Financial resource strain: Not on file   Food insecurity    Worry: Not on file    Inability: Not on file   Transportation needs    Medical: Not on file    Non-medical: Not on file  Tobacco Use   Smoking status: Former Smoker    Packs/day: 0.50    Years: 30.00    Pack years: 15.00    Quit date: 11/05/2012    Years since quitting: 6.7   Smokeless tobacco: Never Used   Tobacco comment: 30 years at most off and on smoking 03/14/15  Substance and Sexual Activity   Alcohol use: Yes    Alcohol/week: 0.0 standard drinks    Comment: 2 drinks per day   Drug use: Yes    Comment: Occasional Marijuana use   Sexual activity: Not on file  Lifestyle   Physical activity    Days per week: Not on file    Minutes per session: Not on file   Stress: Not on file  Relationships   Social connections    Talks on phone: Not on file    Gets together: Not on file    Attends religious service: Not on file    Active member of club or organization: Not on file    Attends meetings of clubs or organizations: Not on file    Relationship status: Not on file   Intimate partner violence    Fear of current or ex partner: Not on file    Emotionally abused: Not on file    Physically abused: Not on file    Forced sexual activity: Not on file  Other Topics Concern   Not on file  Social History Narrative    Originally from [place]. Has lived in [place], [place], [place], & [place] since 1993. 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 Currently has a dog & cats. Previously had a parrot (conure) 12 years ago for 1 year but in same house.  Has also had excessive exposure to pigeon feces. He has had very brief exposure to asbestos around 2000. No hot tub exposure. He has inhaled chemical fumes/gases/refridgerants through his work.  Marital status: married x 26 years      Children: 2 children (19, 2922); no grandchildren      Lives: Lives at home with his wife      Employment: [employer]      Tobacco: none; quit in 2013; smoked x 1/2 ppd x 20 years      Alcohol:  10 drinks; usually 2 drinks per day and then skip two days.      Drugs:  None      Exercise: none; job is physically demanding      Right-handed.   1 cup caffeine daily.    Family History  Problem Relation Age of Onset   Breast cancer Mother        Breast Cancer   Parkinson's disease Mother    Heart disease Father        CABG, stenting cardiac   Cancer Brother [id number]       prostate cancer   Lung disease Neg Hx    Autoimmune disease Neg Hx      Review of Systems  Constitutional: Negative.  Negative for chills and fever.  HENT: Negative.  Negative for congestion and sore throat.   Respiratory: Negative for cough and shortness of breath.        History of sleep apnea  Cardiovascular: Negative.  Negative for chest pain and palpitations.  Gastrointestinal: Negative.  Negative for abdominal pain, diarrhea, nausea and vomiting.  Musculoskeletal: Positive for joint pain.  Skin: Negative.        Scalp lesion  Neurological: Negative for dizziness and headaches.  All other systems reviewed and are negative.  Today's Vitals   07/25/19 1345  BP: (!) 149/80  Pulse: 80  Resp: 16  Temp: 98.7 F (37.1 C)  TempSrc: Oral  SpO2: 96%  Weight: (!) 407 lb (184.6 kg)  Height: 6\' 3"   (1.905 m)   Body mass index is 50.87 kg/m.   Physical Exam Vitals signs reviewed.  Constitutional:      Appearance: He is obese.  HENT:     Head: Normocephalic.  Eyes:     Extraocular Movements: Extraocular movements intact.     Pupils: Pupils are equal, round, and reactive to light.  Cardiovascular:     Rate and Rhythm: Normal rate and regular rhythm.     Heart sounds: Normal heart sounds.  Pulmonary:     Effort: Pulmonary effort is normal.     Breath sounds: Normal breath sounds.  Musculoskeletal:        General: No tenderness.     Right lower leg: Edema present.     Left lower leg: Edema present.     Comments: +1 to +2 pitting edema lower extremities  Skin:    Capillary Refill: Capillary refill takes less than 2 seconds.     Findings: Lesion present.     Comments: Raised, tan-colored, nontender lesion on the left parietal scalp            Neurological:     General: No focal deficit present.     Mental Status: He is alert.  Psychiatric:        Mood and Affect: Mood normal.        Behavior: Behavior normal.    A total of 25 minutes was spent in the room with the patient, greater than 50% of which was in counseling/coordination of care regarding chronic medical problems, management, review of medications, diet and nutrition, chronic venous insufficiency of the legs  and ways to handle it, prognosis, and need for follow-up.   ASSESSMENT & PLAN: [place] was seen today for nevus, medication refill and leg swelling.  Diagnoses and all orders for this visit:  Skin lesion of scalp -     Ambulatory referral to Dermatology  Situational anxiety -     ALPRAZolam (XANAX) 0.5 MG tablet; TAKE 1 TABLET BY MOUTH EVERY DAY AS NEEDED FOR ANXIETY  Chronic venous insufficiency  Hypertension associated with diabetes (HCC)  Dyslipidemia associated with type 2 diabetes mellitus (HCC)  Obstructive sleep apnea syndrome  Diabetic peripheral neuropathy (HCC)  Primary  osteoarthritis involving multiple joints  Morbid obesity (HCC)    Patient Instructions       If you have lab work done today you will be contacted with your lab results within the next 2 weeks.  If you have not heard from [place] then please contact us. The fastest way to get your results is to register for My Chart.   IF you received an x-ray today, you will receive an invoice from [facility]. Please contact [facility] at [phone] with questions or concerns regarding your invoice.   IF you received labwork today, you will receive an invoice from [facility]. Please contact [facility] at [phone] with questions or concerns regarding your invoice.   Our billing staff will not be able to assist you with questions regarding bills from these companies.  You will be contacted with the lab results as soon as they are available. The fastest way to get your results is to activate your My Chart account. Instructions are located on the last page of this paperwork. If you have not heard from [place] regarding the results in 2 weeks, please contact this office.     Diabetes Mellitus and Nutrition, Adult When you have diabetes (diabetes mellitus), it is very important to have healthy eating habits because your blood sugar (glucose) levels are greatly affected by what you eat and drink. Eating healthy foods in the appropriate amounts, at about the same times every day, can help you:  Control your blood glucose.  Lower your risk of heart disease.  Improve your blood pressure.  Reach or maintain a healthy weight. Every person with diabetes is different, and each person has different needs for a meal plan. Your health care provider may recommend that you work with a diet and nutrition specialist (dietitian) to make a meal plan that is best for you. Your meal plan may vary depending on factors such as:  The calories you need.  The medicines you take.  Your weight.  Your blood  glucose, blood pressure, and cholesterol levels.  Your activity level.  Other health conditions you have, such as heart or kidney disease. How do carbohydrates affect me? Carbohydrates, also called carbs, affect your blood glucose level more than any other type of food. Eating carbs naturally raises the amount of glucose in your blood. Carb counting is a method for keeping track of how many carbs you eat. Counting carbs is important to keep your blood glucose at a healthy level, especially if you use insulin or take certain oral diabetes medicines. It is important to know how many carbs you can safely have in each meal. This is different for every person. Your dietitian can help you calculate how many carbs you should have at each meal and for each snack. Foods that contain carbs include:  Bread, cereal, rice, pasta, and crackers.  Potatoes and corn.  Peas, beans, and lentils.  Milk and yogurt.  Fruit and juice.  Desserts, such as cakes, cookies, ice cream, and candy. How does alcohol affect me? Alcohol can cause a sudden decrease in blood glucose (hypoglycemia), especially if you use insulin or take certain oral diabetes medicines. Hypoglycemia can be a life-threatening condition. Symptoms of hypoglycemia (sleepiness, dizziness, and confusion) are similar to symptoms of having too much alcohol. If your health care provider says that alcohol is safe for you, follow these guidelines:  Limit alcohol intake to no more than 1 drink per day for nonpregnant women and 2 drinks per day for men. One drink equals 12 oz of beer, 5 oz of wine, or 1 oz of hard liquor.  Do not drink on an empty stomach.  Keep yourself hydrated with water, diet soda, or unsweetened iced tea.  Keep in mind that regular soda, juice, and other mixers may contain a lot of sugar and must be counted as carbs. What are tips for following this plan?  Reading food labels  Start by checking the serving size on the  "Nutrition Facts" label of packaged foods and drinks. The amount of calories, carbs, fats, and other nutrients listed on the label is based on one serving of the item. Many items contain more than one serving per package.  Check the total grams (g) of carbs in one serving. You can calculate the number of servings of carbs in one serving by dividing the total carbs by 15. For example, if a food has 30 g of total carbs, it would be equal to 2 servings of carbs.  Check the number of grams (g) of saturated and trans fats in one serving. Choose foods that have low or no amount of these fats.  Check the number of milligrams (mg) of salt (sodium) in one serving. Most people should limit total sodium intake to less than 2,300 mg per day.  Always check the nutrition information of foods labeled as "low-fat" or "nonfat". These foods may be higher in added sugar or refined carbs and should be avoided.  Talk to your dietitian to identify your daily goals for nutrients listed on the label. Shopping  Avoid buying canned, premade, or processed foods. These foods tend to be high in fat, sodium, and added sugar.  Shop around the outside edge of the grocery store. This includes fresh fruits and vegetables, bulk grains, fresh meats, and fresh dairy. Cooking  Use low-heat cooking methods, such as baking, instead of high-heat cooking methods like deep frying.  Cook using healthy oils, such as olive, canola, or sunflower oil.  Avoid cooking with butter, cream, or high-fat meats. Meal planning  Eat meals and snacks regularly, preferably at the same times every day. Avoid going long periods of time without eating.  Eat foods high in fiber, such as fresh fruits, vegetables, beans, and whole grains. Talk to your dietitian about how many servings of carbs you can eat at each meal.  Eat 4-6 ounces (oz) of lean protein each day, such as lean meat, chicken, fish, eggs, or tofu. One oz of lean protein is equal  to: ? 1 oz of meat, chicken, or fish. ? 1 egg. ?  cup of tofu.  Eat some foods each day that contain healthy fats, such as avocado, nuts, seeds, and fish. Lifestyle  Check your blood glucose regularly.  Exercise regularly as told by your health care provider. This may include: ? 150 minutes of moderate-intensity or vigorous-intensity exercise each week. This could be brisk walking,  biking, or water aerobics. ? Stretching and doing strength exercises, such as yoga or weightlifting, at least 2 times a week.  Take medicines as told by your health care provider.  Do not use any products that contain nicotine or tobacco, such as cigarettes and e-cigarettes. If you need help quitting, ask your health care provider.  Work with a Social worker or diabetes educator to identify strategies to manage stress and any emotional and social challenges. Questions to ask a health care provider  Do I need to meet with a diabetes educator?  Do I need to meet with a dietitian?  What number can I call if I have questions?  When are the best times to check my blood glucose? Where to find more information:  American Diabetes Association: diabetes.org  Academy of Nutrition and Dietetics: www.eatright.[employer] of Diabetes and Digestive and Kidney Diseases (NIH): [employer] Summary  A healthy meal plan will help you control your blood glucose and maintain a healthy lifestyle.  Working with a diet and nutrition specialist (dietitian) can help you make a meal plan that is best for you.  Keep in mind that carbohydrates (carbs) and alcohol have immediate effects on your blood glucose levels. It is important to count carbs and to use alcohol carefully. This information is not intended to replace advice given to you by your health care provider. Make sure you discuss any questions you have with your health care provider. Document Released: 04/29/2005 Document Revised: 07/15/2017 Document  Reviewed: 09/06/2016 Elsevier Patient Education  2020 [place].  Chronic Venous Insufficiency Chronic venous insufficiency is a condition where the leg veins cannot effectively pump blood from the legs to the heart. This happens when the vein walls are either stretched, weakened, or damaged, or when the valves inside the vein are damaged. With the right treatment, you should be able to continue with an active life. This condition is also called venous stasis. What are the causes? Common causes of this condition include:  High blood pressure inside the veins (venous hypertension).  Sitting or standing too long, causing increased blood pressure in the leg veins.  A blood clot that blocks blood flow in a vein (deep vein thrombosis, DVT).  Inflammation of a vein (phlebitis) that causes a blood clot to form.  Tumors in the pelvis that cause blood to back up. What increases the risk? The following factors may make you more likely to develop this condition:  Having a family history of this condition.  Obesity.  Pregnancy.  Living without enough regular physical activity or exercise (sedentary lifestyle).  Smoking.  Having a job that requires long periods of standing or sitting in one place.  Being a certain age. Women in their 90s and 4s and men in their 59s are more likely to develop this condition. What are the signs or symptoms? Symptoms of this condition include:  Veins that are enlarged, bulging, or twisted (varicose veins).  Skin breakdown or ulcers.  Reddened skin or dark discoloration of skin on the leg between the knee and ankle.  Brown, smooth, tight, and painful skin just above the ankle, usually on the inside of the leg (lipodermatosclerosis).  Swelling of the legs. How is this diagnosed? This condition may be diagnosed based on:  Your medical history.  A physical exam.  Tests, such as: ? A procedure that creates an image of a blood vessel and nearby  organs and provides information about blood flow through the blood vessel (duplex ultrasound). ?  A procedure that tests blood flow (plethysmography). ? A procedure that looks at the veins using X-ray and dye (venogram). How is this treated? The goals of treatment are to help you return to an active life and to minimize pain or disability. Treatment depends on the severity of your condition, and it may include:  Wearing compression stockings. These can help relieve symptoms and help prevent your condition from getting worse. However, they do not cure the condition.  Sclerotherapy. This procedure involves an injection of a solution that shrinks damaged veins.  Surgery. This may involve: ? Removing a diseased vein (vein stripping). ? Cutting off blood flow through the vein (laser ablation surgery). ? Repairing or reconstructing a valve within the affected vein. Follow these instructions at home:      Wear compression stockings as told by your health care provider. These stockings help to prevent blood clots and reduce swelling in your legs.  Take over-the-counter and prescription medicines only as told by your health care provider.  Stay active by exercising, walking, or doing different activities. Ask your health care provider what activities are safe for you and how much exercise you need.  Drink enough fluid to keep your urine pale yellow.  Do not use any products that contain nicotine or tobacco, such as cigarettes, e-cigarettes, and chewing tobacco. If you need help quitting, ask your health care provider.  Keep all follow-up visits as told by your health care provider. This is important. Contact a health care provider if you:  Have redness, swelling, or more pain in the affected area.  See a red streak or line that goes up or down from the affected area.  Have skin breakdown or skin loss in the affected area, even if the breakdown is small.  Get an injury in the affected  area. Get help right away if:  You get an injury and an open wound in the affected area.  You have: ? Severe pain that does not get better with medicine. ? Sudden numbness or weakness in the foot or ankle below the affected area. ? Trouble moving your foot or ankle. ? A fever. ? Worse or persistent symptoms. ? Chest pain. ? Shortness of breath. Summary  Chronic venous insufficiency is a condition where the leg veins cannot effectively pump blood from the legs to the heart.  Chronic venous insufficiency occurs when the vein walls become stretched, weakened, or damaged, or when valves within the vein are damaged.  Treatment depends on how severe your condition is. It often involves wearing compression stockings and may involve having a procedure.  Make sure you stay active by exercising, walking, or doing different activities. Ask your health care provider what activities are safe for you and how much exercise you need. This information is not intended to replace advice given to you by your health care provider. Make sure you discuss any questions you have with your health care provider. Document Released: 12/06/2006 Document Revised: 04/25/2018 Document Reviewed: 04/25/2018 Elsevier Patient Education  2020 [employer] Inc[employer]      Edwina Barth, MD [facility]

## 2019-08-10 ENCOUNTER — Other Ambulatory Visit: Payer: Self-pay | Admitting: Emergency Medicine

## 2019-08-10 DIAGNOSIS — G6289 Other specified polyneuropathies: Secondary | ICD-10-CM

## 2019-08-12 NOTE — Telephone Encounter (Signed)
Forwarding medication refill request to the clinical pool for review. 

## 2019-08-18 ENCOUNTER — Other Ambulatory Visit: Payer: Self-pay | Admitting: Emergency Medicine

## 2019-08-29 ENCOUNTER — Encounter: Payer: Self-pay | Admitting: Emergency Medicine

## 2019-08-29 ENCOUNTER — Ambulatory Visit: Payer: BC Managed Care – PPO | Admitting: Emergency Medicine

## 2019-08-29 ENCOUNTER — Other Ambulatory Visit: Payer: Self-pay

## 2019-08-29 VITALS — BP 130/83 | HR 84 | Temp 98.0°F | Resp 16 | Ht 75.0 in | Wt >= 6400 oz

## 2019-08-29 DIAGNOSIS — E1169 Type 2 diabetes mellitus with other specified complication: Secondary | ICD-10-CM

## 2019-08-29 DIAGNOSIS — R1030 Lower abdominal pain, unspecified: Secondary | ICD-10-CM

## 2019-08-29 DIAGNOSIS — E1159 Type 2 diabetes mellitus with other circulatory complications: Secondary | ICD-10-CM

## 2019-08-29 DIAGNOSIS — M51369 Other intervertebral disc degeneration, lumbar region without mention of lumbar back pain or lower extremity pain: Secondary | ICD-10-CM

## 2019-08-29 DIAGNOSIS — M1712 Unilateral primary osteoarthritis, left knee: Secondary | ICD-10-CM

## 2019-08-29 DIAGNOSIS — G4733 Obstructive sleep apnea (adult) (pediatric): Secondary | ICD-10-CM

## 2019-08-29 DIAGNOSIS — E785 Hyperlipidemia, unspecified: Secondary | ICD-10-CM

## 2019-08-29 DIAGNOSIS — M1711 Unilateral primary osteoarthritis, right knee: Secondary | ICD-10-CM

## 2019-08-29 DIAGNOSIS — M5136 Other intervertebral disc degeneration, lumbar region: Secondary | ICD-10-CM

## 2019-08-29 DIAGNOSIS — M48061 Spinal stenosis, lumbar region without neurogenic claudication: Secondary | ICD-10-CM

## 2019-08-29 DIAGNOSIS — I872 Venous insufficiency (chronic) (peripheral): Secondary | ICD-10-CM | POA: Diagnosis not present

## 2019-08-29 DIAGNOSIS — I152 Hypertension secondary to endocrine disorders: Secondary | ICD-10-CM

## 2019-08-29 DIAGNOSIS — I10 Essential (primary) hypertension: Secondary | ICD-10-CM | POA: Diagnosis not present

## 2019-08-29 DIAGNOSIS — E1142 Type 2 diabetes mellitus with diabetic polyneuropathy: Secondary | ICD-10-CM

## 2019-08-29 LAB — POCT GLYCOSYLATED HEMOGLOBIN (HGB A1C): Hemoglobin A1C: 8.4 % — AB (ref 4.0–5.6)

## 2019-08-29 LAB — GLUCOSE, POCT (MANUAL RESULT ENTRY): POC Glucose: 162 mg/dl — AB (ref 70–99)

## 2019-08-29 NOTE — Progress Notes (Signed)
Johnny Andrews 59 y.o.   Chief Complaint  Patient presents with  . Abdominal Pain    per pt COLON pain x 1 week, pt denies diarrhea or blood in stool    HISTORY OF PRESENT ILLNESS: This is a 59 y.o. male complaining of intermittent mid lower abdominal pain for 1 week, better with bowel movements, no rectal bleeding, no fever, no nausea or vomiting.  Still able to eat and drink.  No other associated symptoms.  HPI   Prior to Admission medications   Medication Sig Start Date End Date Taking? Authorizing Provider  ALPRAZolam Prudy Feeler) 0.5 MG tablet TAKE 1 TABLET BY MOUTH EVERY DAY AS NEEDED FOR ANXIETY 07/25/19  Yes Rayden Dock, Eilleen Kempf, MD  diclofenac (VOLTAREN) 75 MG EC tablet TAKE 1 TABLET BY MOUTH TWICE A DAY 12/21/17  Yes Ethelda Chick, MD  diltiazem [facility]) 180 MG 24 hr capsule Take 180 mg by mouth daily. 02/10/17  Yes [provider]  insulin aspart (NOVOLOG FLEXPEN) 100 UNIT/ML FlexPen Inject 75 Units into the skin 3 (three) times daily with meals. 30 units am and 50 units pm   Yes [provider]  Levothyroxine Sodium 137 MCG CAPS levothyroxine 137 mcg capsule  Take 2 capsules every day by oral route.   Yes [provider]  lisinopril-hydrochlorothiazide (PRINZIDE,ZESTORETIC) 20-12.5 MG tablet Take 1 tablet by mouth daily.   Yes [provider]  metFORMIN (GLUCOPHAGE-XR) 500 MG 24 hr tablet Take 1,000 mg by mouth 2 (two) times daily.  09/15/15  Yes [provider]  simvastatin (ZOCOR) 20 MG tablet Take 20 mg by mouth daily.   Yes [provider]  topiramate (TOPAMAX) 50 MG tablet TAKE 2 TABLETS (100 MG TOTAL) BY MOUTH AT BEDTIME. 08/13/19  Yes Dong Nimmons, Eilleen Kempf, MD  traZODone (DESYREL) 100 MG tablet TAKE 1 TABLET BY MOUTH EVERYDAY AT BEDTIME 08/19/19  Yes Magdelyn Roebuck, [place], MD  TRESIBA FLEXTOUCH 200 UNIT/ML SOPN INJECT 100 UNITS TWICE A DAY 12/17/16  Yes [provider]  BD ULTRA-FINE PEN NEEDLES 29G X 12.7MM MISC  USE TWO CHECK BLOOD SUGAR TWICE A DAY. APPOINTMENT NEEDED FOR FURTHER REFILLS 03/09/16   Reather Littler, MD  glucose blood (ONE TOUCH ULTRA TEST) test strip Two times a day for ULTRA 2 ONE TOUCH 04/19/18   Georgina Quint, MD  Respiratory Therapy Supplies (ADULT MASK) MISC 2 Devices by Does not apply route once. 11/24/11   Oretha Milch, MD    No Known Allergies  Patient Active Problem List   Diagnosis Date Noted  . Chronic venous insufficiency 07/25/2019  . Skin lesion of scalp 07/25/2019  . Carpal tunnel syndrome of right wrist 04/24/2018  . Diabetic peripheral neuropathy (HCC) 04/24/2018  . Morbid obesity (HCC) 01/27/2018  . Osteoarthritis of left knee 12/16/2017  . Osteoarthritis of right knee 12/16/2017  . Family history of prostate cancer 03/06/2017  . Pure hypercholesterolemia 09/13/2016  . Lumbar degenerative disc disease 07/22/2016  . Spinal stenosis of lumbar region 11/03/2015  . Abnormality of gait 09/15/2015  . Restrictive lung disease 05/05/2015  . 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 Hypogonadism male 02/05/2013  . Elevated PSA 11/30/2011  . OTHER DISEASES OF LUNG NOT ELSEWHERE CLASSIFIED 07/20/2010  . GOUT 04/17/2010  . SMOKER 04/17/2010  . Sleep apnea 08/22/2009  . Hypothyroidism 09/19/2007  . Dyslipidemia associated with type 2 diabetes mellitus (HCC) 09/19/2007  . Hypertension associated with diabetes (HCC) 09/19/2007  . Osteoarthritis 09/19/2007    Past Medical  History:  Diagnosis Date  . ALCOHOL USE 10/24/2008  . ANXIETY 09/19/2007  . Cough 05/22/2008  . DIABETES MELLITUS, TYPE II 09/19/2007  . External thrombosed hemorrhoids 08/23/2008  . GOUT 04/17/2010  . HYPERLIPIDEMIA 09/19/2007  . HYPERTENSION 09/19/2007  . HYPOTHYROIDISM 09/19/2007  . Leg pain   . OSTEOARTHRITIS 09/19/2007  . OTHER DISEASES OF LUNG NOT ELSEWHERE CLASSIFIED 07/20/2010  . Rash and other nonspecific skin eruption 09/19/2007  . Restrictive lung disease   . SCROTAL ABSCESS 05/27/2010  .  Sebaceous cyst 08/01/2008  . SLEEP APNEA 08/22/2009  . SMOKER 04/17/2010    Past Surgical History:  Procedure Laterality Date  . COLONOSCOPY    . NASAL SEPTUM SURGERY      Social History   Socioeconomic History  . Marital status: Married    Spouse name: Not on file  . Number of children: 2  . Years of education: BS  . Highest education level: Not on file  Occupational History  . Occupation: Software engineer: [employer]  Tobacco Use  . Smoking status: Former Smoker    Packs/day: 0.50    Years: 30.00    Pack years: 15.00    Quit date: 11/05/2012    Years since quitting: 6.8  . Smokeless tobacco: Never Used  . Tobacco comment: 30 years at most off and on smoking 03/14/15  Substance and Sexual Activity  . Alcohol use: Yes    Alcohol/week: 0.0 standard drinks    Comment: 2 drinks per day  . Drug use: Yes    Comment: Occasional Marijuana use  . Sexual activity: Not on file  Other Topics Concern  . Not on file  Social History Narrative   Originally from [place]. Has lived in [place], [place], [place], & [place] since 1993. 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 Currently has a dog & cats. Previously had a parrot (conure) 12 years ago for 1 year but in same house.  Has also had excessive exposure to pigeon feces. He has had very brief exposure to asbestos around 2000. No hot tub exposure. He has inhaled chemical fumes/gases/refridgerants through his work.      Marital status: married x 26 years      Children: 2 children (19, 78); no grandchildren      Lives: Lives at home with his wife      Employment: [employer]      Tobacco: none; quit in 2013; smoked x 1/2 ppd x 20 years      Alcohol:  10 drinks; usually 2 drinks per day and then skip two days.      Drugs:  None      Exercise: none; job is physically demanding      Right-handed.   1 cup caffeine daily.   Social Determinants of Health   Financial Resource Strain:   .  Difficulty of Paying Living Expenses: Not on file  Food Insecurity:   . Worried About Programme researcher, broadcasting/film/video in the Last Year: Not on file  . Ran Out of Food in the Last Year: Not on file  Transportation Needs:   . Lack of Transportation (Medical): Not on file  . Lack of Transportation (Non-Medical): Not on file  Physical Activity:   . Days of Exercise per Week: Not on file  . Minutes of Exercise per Session: Not on file  Stress:   . Feeling of Stress :  Not on file  Social Connections:   . Frequency of Communication with Friends and Family: Not on file  . Frequency of Social Gatherings with Friends and Family: Not on file  . Attends Religious Services: Not on file  . Active Member of Clubs or Organizations: Not on file  . Attends BankerClub or Organization Meetings: Not on file  . Marital Status: Not on file  Intimate Partner Violence:   . Fear of Current or Ex-Partner: Not on file  . Emotionally Abused: Not on file  . Physically Abused: Not on file  . Sexually Abused: Not on file    Family History  Problem Relation Age of Onset  . Breast cancer Mother        Breast Cancer  . Parkinson's disease Mother   . Heart disease Father        CABG, stenting cardiac  . Cancer Brother [id number]       prostate cancer  . Lung disease Neg Hx   . Autoimmune disease Neg Hx      Review of Systems  Constitutional: Negative.  Negative for chills and fever.  HENT: Negative.  Negative for congestion and sore throat.   Respiratory: Negative.  Negative for cough and shortness of breath.   Cardiovascular: Negative.  Negative for chest pain and palpitations.  Gastrointestinal: Positive for abdominal pain and constipation. Negative for blood in stool, diarrhea, melena, nausea and vomiting.  Genitourinary: Negative.  Negative for dysuria and hematuria.  Musculoskeletal: Positive for back pain (Chronic) and joint pain (Chronic).  Skin: Negative.  Negative for rash.  Neurological: Negative.  Negative for  dizziness and headaches.  Endo/Heme/Allergies: Negative.   All other systems reviewed and are negative.  Today's Vitals   08/29/19 1335  BP: 130/83  Pulse: 84  Resp: 16  Temp: 98 F (36.7 C)  TempSrc: Temporal  SpO2: 95%  Weight: (!) 407 lb (184.6 kg)  Height: 6\' 3"  (1.905 m)   Body mass index is 50.87 kg/m.   Physical Exam Vitals reviewed.  Constitutional:      Appearance: He is well-developed. He is obese.  HENT:     Head: Normocephalic and atraumatic.  Eyes:     Extraocular Movements: Extraocular movements intact.     Pupils: Pupils are equal, round, and reactive to light.  Cardiovascular:     Rate and Rhythm: Normal rate and regular rhythm.     Heart sounds: Normal heart sounds.  Pulmonary:     Effort: Pulmonary effort is normal.     Breath sounds: Normal breath sounds.  Abdominal:     General: Bowel sounds are normal. There is no distension.     Palpations: Abdomen is soft.     Tenderness: There is no abdominal tenderness. There is no guarding.  Musculoskeletal:        General: Normal range of motion.     Cervical back: Normal range of motion and neck supple.  Skin:    General: Skin is warm and dry.     Capillary Refill: Capillary refill takes less than 2 seconds.  Neurological:     General: No focal deficit present.     Mental Status: He is alert and oriented to person, place, and time.  Psychiatric:        Mood and Affect: Mood normal.        Behavior: Behavior normal.    A total of 30 minutes was spent in the room with the patient, greater than 50% of which was  in counseling/coordination of care regarding differential diagnosis of abdominal pain, treatment and management including diet and nutrition, medications and prognosis, review of most recent blood work and office visit, chronic medical problems and chronic medications, and need for follow-up.   ASSESSMENT & PLAN: Johnny Andrews was seen today for abdominal pain.  Diagnoses and all orders for this  visit:  Lower abdominal pain  Hypertension associated with diabetes (HCC) -     Comprehensive metabolic panel -     Lipid panel -     POCT glucose (manual entry) -     POCT glycosylated hemoglobin (Hb A1C) -     HM Diabetes Foot Exam  Chronic venous insufficiency  Dyslipidemia associated with type 2 diabetes mellitus (HCC)  Obstructive sleep apnea syndrome  Diabetic peripheral neuropathy (HCC)  Morbid obesity (HCC)  Osteoarthritis of left knee, unspecified osteoarthritis type  Osteoarthritis of right knee, unspecified osteoarthritis type  Lumbar degenerative disc disease  Spinal stenosis of lumbar region, unspecified whether neurogenic claudication present    Patient Instructions       If you have lab work done today you will be contacted with your lab results within the next 2 weeks.  If you have not heard from [place] then please contact us. The fastest way to get your results is to register for My Chart.   IF you received an x-ray today, you will receive an invoice from [facility]. Please contact [facility] at [phone] with questions or concerns regarding your invoice.   IF you received labwork today, you will receive an invoice from [facility]. Please contact [facility] at [phone] with questions or concerns regarding your invoice.   Our billing staff will not be able to assist you with questions regarding bills from these companies.  You will be contacted with the lab results as soon as they are available. The fastest way to get your results is to activate your My Chart account. Instructions are located on the last page of this paperwork. If you have not heard from [place] regarding the results in 2 weeks, please contact this office.     Abdominal Pain, Adult Many things can cause belly (abdominal) pain. Most times, belly pain is not dangerous. Many cases of belly pain can be watched and treated at home. Sometimes, though, belly pain is serious.  Your doctor will try to find the cause of your belly pain. Follow these instructions at home:  Medicines  Take over-the-counter and prescription medicines only as told by your doctor.  Do not take medicines that help you poop (laxatives) unless told by your doctor. General instructions  Watch your belly pain for any changes.  Drink enough fluid to keep your pee (urine) pale yellow.  Keep all follow-up visits as told by your doctor. This is important. Contact a doctor if:  Your belly pain changes or gets worse.  You are not hungry, or you lose weight without trying.  You are having trouble pooping (constipated) or have watery poop (diarrhea) for more than 2-3 days.  You have pain when you pee or poop.  Your belly pain wakes you up at night.  Your pain gets worse with meals, after eating, or with certain foods.  You are vomiting and cannot keep anything down.  You have a fever.  You have blood in your pee. Get help right away if:  Your pain does not go away as soon as your doctor says it should.  You cannot stop vomiting.  Your pain is only  in areas of your belly, such as the right side or the left lower part of the belly.  You have bloody or black poop, or poop that looks like tar.  You have very bad pain, cramping, or bloating in your belly.  You have signs of not having enough fluid or water in your body (dehydration), such as: ? Dark pee, very little pee, or no pee. ? Cracked lips. ? Dry mouth. ? Sunken eyes. ? Sleepiness. ? Weakness.  You have trouble breathing or chest pain. Summary  Many cases of belly pain can be watched and treated at home.  Watch your belly pain for any changes.  Take over-the-counter and prescription medicines only as told by your doctor.  Contact a doctor if your belly pain changes or gets worse.  Get help right away if you have very bad pain, cramping, or bloating in your belly. This information is not intended to replace  advice given to you by your health care provider. Make sure you discuss any questions you have with your health care provider. Document Revised: 12/11/2018 Document Reviewed: 12/11/2018 Elsevier Patient Education  2020 Elsevier Inc.      Agustina Caroli, MD [facility]

## 2019-08-29 NOTE — Patient Instructions (Addendum)
   If you have lab work done today you will be contacted with your lab results within the next 2 weeks.  If you have not heard from us then please contact us. The fastest way to get your results is to register for My Chart.   IF you received an x-ray today, you will receive an invoice from [facility]. Please contact [facility] at [phone] with questions or concerns regarding your invoice.   IF you received labwork today, you will receive an invoice from [facility]. Please contact [facility] at [phone] with questions or concerns regarding your invoice.   Our billing staff will not be able to assist you with questions regarding bills from these companies.  You will be contacted with the lab results as soon as they are available. The fastest way to get your results is to activate your My Chart account. Instructions are located on the last page of this paperwork. If you have not heard from us regarding the results in 2 weeks, please contact this office.     Abdominal Pain, Adult Many things can cause belly (abdominal) pain. Most times, belly pain is not dangerous. Many cases of belly pain can be watched and treated at home. Sometimes, though, belly pain is serious. Your doctor will try to find the cause of your belly pain. Follow these instructions at home:  Medicines  Take over-the-counter and prescription medicines only as told by your doctor.  Do not take medicines that help you poop (laxatives) unless told by your doctor. General instructions  Watch your belly pain for any changes.  Drink enough fluid to keep your pee (urine) pale yellow.  Keep all follow-up visits as told by your doctor. This is important. Contact a doctor if:  Your belly pain changes or gets worse.  You are not hungry, or you lose weight without trying.  You are having trouble pooping (constipated) or have watery poop (diarrhea) for more than 2-3 days.  You have pain when you pee  or poop.  Your belly pain wakes you up at night.  Your pain gets worse with meals, after eating, or with certain foods.  You are vomiting and cannot keep anything down.  You have a fever.  You have blood in your pee. Get help right away if:  Your pain does not go away as soon as your doctor says it should.  You cannot stop vomiting.  Your pain is only in areas of your belly, such as the right side or the left lower part of the belly.  You have bloody or black poop, or poop that looks like tar.  You have very bad pain, cramping, or bloating in your belly.  You have signs of not having enough fluid or water in your body (dehydration), such as: ? Dark pee, very little pee, or no pee. ? Cracked lips. ? Dry mouth. ? Sunken eyes. ? Sleepiness. ? Weakness.  You have trouble breathing or chest pain. Summary  Many cases of belly pain can be watched and treated at home.  Watch your belly pain for any changes.  Take over-the-counter and prescription medicines only as told by your doctor.  Contact a doctor if your belly pain changes or gets worse.  Get help right away if you have very bad pain, cramping, or bloating in your belly. This information is not intended to replace advice given to you by your health care provider. Make sure you discuss any questions you have with your health   care provider. Document Revised: 12/11/2018 Document Reviewed: 12/11/2018 Elsevier Patient Education  2020 Elsevier Inc.  

## 2019-08-30 LAB — COMPREHENSIVE METABOLIC PANEL
ALT: 32 IU/L (ref 0–44)
AST: 30 IU/L (ref 0–40)
Albumin/Globulin Ratio: 1.7 (ref 1.2–2.2)
Albumin: 4.5 g/dL (ref 3.8–4.9)
Alkaline Phosphatase: 56 IU/L (ref 39–117)
BUN/Creatinine Ratio: 21 — ABNORMAL HIGH (ref 9–20)
BUN: 19 mg/dL (ref 6–24)
Bilirubin Total: 0.5 mg/dL (ref 0.0–1.2)
CO2: 23 mmol/L (ref 20–29)
Calcium: 9.7 mg/dL (ref 8.7–10.2)
Chloride: 99 mmol/L (ref 96–106)
Creatinine, Ser: 0.91 mg/dL (ref 0.76–1.27)
GFR calc Af Amer: 107 mL/min/{1.73_m2} (ref >59)
GFR calc non Af Amer: 93 mL/min/{1.73_m2} (ref >59)
Globulin, Total: 2.7 g/dL (ref 1.5–4.5)
Glucose: 172 mg/dL — ABNORMAL HIGH (ref 65–99)
Potassium: 4.8 mmol/L (ref 3.5–5.2)
Sodium: 136 mmol/L (ref 134–144)
Total Protein: 7.2 g/dL (ref 6.0–8.5)

## 2019-08-30 LAB — LIPID PANEL
Chol/HDL Ratio: 4.1 ratio (ref 0.0–5.0)
Cholesterol, Total: 189 mg/dL (ref 100–199)
HDL: 46 mg/dL (ref >39)
LDL Chol Calc (NIH): 89 mg/dL (ref 0–99)
Triglycerides: 327 mg/dL — ABNORMAL HIGH (ref 0–149)
VLDL Cholesterol Cal: 54 mg/dL — ABNORMAL HIGH (ref 5–40)

## 2019-08-31 ENCOUNTER — Encounter: Payer: Self-pay | Admitting: Radiology

## 2019-08-31 NOTE — Progress Notes (Signed)
Thank you, Brian!

## 2019-09-11 ENCOUNTER — Other Ambulatory Visit: Payer: Self-pay | Admitting: Emergency Medicine

## 2019-09-11 DIAGNOSIS — G6289 Other specified polyneuropathies: Secondary | ICD-10-CM

## 2019-09-11 NOTE — Telephone Encounter (Signed)
Requested medication (s) are due for refill today: yes  Requested medication (s) are on the active medication list: yes  Last refill:  08/13/2019  Future visit scheduled: no  Notes to clinic:  this refill cannot be delegated    Requested Prescriptions  Pending Prescriptions Disp Refills   topiramate (TOPAMAX) 50 MG tablet [Pharmacy Med Name: TOPIRAMATE 50 MG TABLET] 60 tablet 0    Sig: TAKE 2 TABLETS (100 MG TOTAL) BY MOUTH AT BEDTIME.      Not Delegated - Neurology: Anticonvulsants - topiramate & zonisamide Failed - 09/11/2019 11:32 AM      Failed - This refill cannot be delegated      Passed - Cr in normal range and within 360 days    Creat  Date Value Ref Range Status  09/01/2015 0.62 (L) 0.70 - 1.33 mg/dL Final   Creatinine, Ser  Date Value Ref Range Status  08/29/2019 0.91 0.76 - 1.27 mg/dL Final   Creatinine,U  Date Value Ref Range Status  08/21/2014 40.5 mg/dL Final          Passed - CO2 in normal range and within 360 days    CO2  Date Value Ref Range Status  08/29/2019 23 20 - 29 mmol/L Final          Passed - Valid encounter within last 12 months    Recent Outpatient Visits           1 week ago Lower abdominal pain   [facility] at [place], Eilleen Kempf, MD   1 month ago Skin lesion of scalp   [facility] [place], [place], MD   8 months ago Type 2 diabetes mellitus with diabetic polyneuropathy, with long-term current use of insulin [facility])   [facility] at [facility], Eilleen Kempf, MD   10 months ago Situational anxiety   [facility] at [place], Eilleen Kempf, MD   10 months ago Other polyneuropathy   [facility] at [place], Asencion Partridge, MD

## 2019-10-09 ENCOUNTER — Other Ambulatory Visit: Payer: Self-pay | Admitting: Emergency Medicine

## 2019-10-09 DIAGNOSIS — G6289 Other specified polyneuropathies: Secondary | ICD-10-CM

## 2019-10-09 NOTE — Telephone Encounter (Signed)
Pt is requesting medication on pended ed, last fil 09/12/19

## 2019-10-22 ENCOUNTER — Other Ambulatory Visit: Payer: Self-pay | Admitting: Emergency Medicine

## 2019-10-22 DIAGNOSIS — F418 Other specified anxiety disorders: Secondary | ICD-10-CM

## 2019-10-22 NOTE — Telephone Encounter (Signed)
Patient is requesting a refill of the following medications: Requested Prescriptions   Pending Prescriptions Disp Refills  . ALPRAZolam (XANAX) 0.5 MG tablet [Pharmacy Med Name: ALPRAZOLAM 0.5 MG TABLET] 30 tablet 1    Sig: TAKE 1 TABLET BY MOUTH EVERY DAY AS NEEDED FOR ANXIETY    Date of last refill: 09/06/19

## 2019-10-22 NOTE — Telephone Encounter (Signed)
Requested medication (s) are due for refill today: yes  Requested medication (s) are on the active medication list:yes  Last refill:  09/06/19  Future visit scheduled: no  Notes to clinic:  not delegated    Requested Prescriptions  Pending Prescriptions Disp Refills   ALPRAZolam (XANAX) 0.5 MG tablet [Pharmacy Med Name: ALPRAZOLAM 0.5 MG TABLET] 30 tablet 1    Sig: TAKE 1 TABLET BY MOUTH EVERY DAY AS NEEDED FOR ANXIETY      Not Delegated - Psychiatry:  Anxiolytics/Hypnotics Failed - 10/22/2019  3:08 PM      Failed - This refill cannot be delegated      Failed - Urine Drug Screen completed in last 360 days.      Passed - Valid encounter within last 6 months    Recent Outpatient Visits           1 month ago Lower abdominal pain   [facility] at [facility], [place], MD   2 months ago Skin lesion of scalp   [facility] at [facility], [place], MD   10 months ago Type 2 diabetes mellitus with diabetic polyneuropathy, with long-term current use of insulin [facility])   [facility] at [facility], Eilleen Kempf, MD   11 months ago Situational anxiety   [facility] at [place], [place], MD   12 months ago Other polyneuropathy   [facility] [place], Asencion Partridge, MD

## 2019-11-01 ENCOUNTER — Telehealth: Payer: Self-pay | Admitting: Emergency Medicine

## 2019-11-01 NOTE — Telephone Encounter (Signed)
Pt called regarding disability paperwork Letter stating your condition nueropathy of feet and arthritis of knees and unable to work   Dr.sagardia needs to change chart ,   Any costs for payment will go through the financial group   [place] [employer]  Attn: gina Surveyor, minerals T: [phone] ext [phone] F: [phone]  Attachment proof of disability

## 2019-11-02 NOTE — Telephone Encounter (Signed)
Regarding the letter from the provider

## 2019-11-02 NOTE — Telephone Encounter (Signed)
Called pt, left message for him to call bk.

## 2019-11-07 NOTE — Telephone Encounter (Signed)
Spoke to patient concerning disability paper work or letter he requested to completed faxed to [employer]. 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

## 2019-11-08 ENCOUNTER — Other Ambulatory Visit: Payer: Self-pay | Admitting: Emergency Medicine

## 2019-11-08 ENCOUNTER — Telehealth: Payer: Self-pay | Admitting: *Deleted

## 2019-11-08 ENCOUNTER — Telehealth: Payer: Self-pay | Admitting: Emergency Medicine

## 2019-11-08 DIAGNOSIS — G6289 Other specified polyneuropathies: Secondary | ICD-10-CM

## 2019-11-08 NOTE — Telephone Encounter (Signed)
Faxed disability forms to Johnny Andrews. Confirmation page 4:52 pm.

## 2019-11-08 NOTE — Telephone Encounter (Signed)
Requested medication (s) are due for refill today: yes  Requested medication (s) are on the active medication list: yes  Last refill:  10/09/19  Future visit scheduled: no  Notes to clinic:  not delegated    Requested Prescriptions  Pending Prescriptions Disp Refills   topiramate (TOPAMAX) 50 MG tablet [Pharmacy Med Name: TOPIRAMATE 50 MG TABLET] 60 tablet 0    Sig: TAKE 2 TABLETS (100 MG TOTAL) BY MOUTH AT BEDTIME.      Not Delegated - Neurology: Anticonvulsants - topiramate & zonisamide Failed - 11/08/2019 10:31 AM      Failed - This refill cannot be delegated      Passed - Cr in normal range and within 360 days    Creat  Date Value Ref Range Status  09/01/2015 0.62 (L) 0.70 - 1.33 mg/dL Final   Creatinine, Ser  Date Value Ref Range Status  08/29/2019 0.91 0.76 - 1.27 mg/dL Final   Creatinine,U  Date Value Ref Range Status  08/21/2014 40.5 mg/dL Final          Passed - CO2 in normal range and within 360 days    CO2  Date Value Ref Range Status  08/29/2019 23 20 - 29 mmol/L Final          Passed - Valid encounter within last 12 months    Recent Outpatient Visits           2 months ago Lower abdominal pain   [facility] at [place], Eilleen Kempf, MD   3 months ago Skin lesion of scalp   [facility] at [place], [place], MD   10 months ago Type 2 diabetes mellitus with diabetic polyneuropathy, with long-term current use of insulin [facility])   Primary Care at [facility], Eilleen Kempf, MD   1 year ago Situational anxiety   [facility] at [place], Eilleen Kempf, MD   1 year ago Other polyneuropathy   [facility] at [place], Asencion Partridge, MD

## 2019-11-08 NOTE — Telephone Encounter (Signed)
Patient dropped off disability forms to be filled out .   Forms were given to [facility]

## 2019-12-01 ENCOUNTER — Other Ambulatory Visit: Payer: Self-pay | Admitting: Emergency Medicine

## 2019-12-01 DIAGNOSIS — G6289 Other specified polyneuropathies: Secondary | ICD-10-CM

## 2019-12-01 NOTE — Telephone Encounter (Signed)
Requested medication (s) are due for refill today: yes  Requested medication (s) are on the active medication list: yes  Last refill:  11/08/19  Future visit scheduled: no  Notes to clinic:  medication not delegated to NT to refill   Requested Prescriptions  Pending Prescriptions Disp Refills   topiramate (TOPAMAX) 50 MG tablet [Pharmacy Med Name: TOPIRAMATE 50 MG TABLET] 60 tablet 0    Sig: TAKE 2 TABLETS (100 MG TOTAL) BY MOUTH AT BEDTIME.      Not Delegated - Neurology: Anticonvulsants - topiramate & zonisamide Failed - 12/01/2019 10:30 AM      Failed - This refill cannot be delegated      Passed - Cr in normal range and within 360 days    Creat  Date Value Ref Range Status  09/01/2015 0.62 (L) 0.70 - 1.33 mg/dL Final   Creatinine, Ser  Date Value Ref Range Status  08/29/2019 0.91 0.76 - 1.27 mg/dL Final   Creatinine,U  Date Value Ref Range Status  08/21/2014 40.5 mg/dL Final          Passed - CO2 in normal range and within 360 days    CO2  Date Value Ref Range Status  08/29/2019 23 20 - 29 mmol/L Final          Passed - Valid encounter within last 12 months    Recent Outpatient Visits           3 months ago Lower abdominal pain   [facility] [place], Eilleen Kempf, MD   4 months ago Skin lesion of scalp   [facility] [place], [place], MD   11 months ago Type 2 diabetes mellitus with diabetic polyneuropathy, with long-term current use of insulin [facility])   [facility] at [facility], Eilleen Kempf, MD   1 year ago Situational anxiety   [facility] at [place], Eilleen Kempf, MD   1 year ago Other polyneuropathy   [facility] at [place], Asencion Partridge, MD

## 2019-12-03 ENCOUNTER — Other Ambulatory Visit: Payer: Self-pay | Admitting: Emergency Medicine

## 2019-12-03 DIAGNOSIS — G8929 Other chronic pain: Secondary | ICD-10-CM

## 2019-12-03 NOTE — Telephone Encounter (Signed)
Medication Refill - Medication: diclofenac (VOLTAREN) 75 MG EC tablet   Has the patient contacted their pharmacy? Yes.   (Agent: If no, request that the patient contact the pharmacy for the refill.) (Agent: If yes, when and what did the pharmacy advise?)  Preferred Pharmacy (with phone number or street name):  [id number] [place], [place] - [address]  [address] [place] [address]  Phone: [phone] Fax: [phone]     Agent: Please be advised that RX refills may take up to 3 business days. We ask that you follow-up with your pharmacy.

## 2019-12-05 MED ORDER — DICLOFENAC SODIUM 75 MG PO TBEC: 75.0000 mg | DELAYED_RELEASE_TABLET | Freq: Two times a day (BID) | ORAL | 1 refills | Status: DC

## 2019-12-05 NOTE — Telephone Encounter (Signed)
This medication okay to refill?   Pt was last seen on 08/29/19 for lower back pain

## 2020-01-24 ENCOUNTER — Telehealth: Payer: Self-pay | Admitting: Emergency Medicine

## 2020-01-24 NOTE — Telephone Encounter (Signed)
Patient is calling to ask provider because he is permantly disabled  is  there iaway for him to get  a permanent handicapped licence plate .

## 2020-01-25 NOTE — Telephone Encounter (Signed)
I have called the pt and informed him that I have printed out the form for a handicap license plate and put his demographic information on it. I placed it in the providers to be signed box to review and sign. I have not guaranteed that this will get done. The provider will see if it is appropriate.   If and once it is completed we will call the pt and have him sign and we can file a copy and give him the original.  Thanks

## 2020-02-06 ENCOUNTER — Telehealth: Payer: Self-pay | Admitting: *Deleted

## 2020-02-06 NOTE — Telephone Encounter (Signed)
Left message in mobile voice mail DMV application for license plate is ready for pick up at check in.

## 2020-02-26 ENCOUNTER — Other Ambulatory Visit: Payer: Self-pay | Admitting: Emergency Medicine

## 2020-02-26 DIAGNOSIS — F418 Other specified anxiety disorders: Secondary | ICD-10-CM

## 2020-02-26 NOTE — Telephone Encounter (Signed)
Requested medication (s) are due for refill today - yes  Requested medication (s) are on the active medication list -yes  Future visit scheduled -no  Last refill: 11/29/19  Notes to clinic: Request for non delegated Rx  Requested Prescriptions  Pending Prescriptions Disp Refills   ALPRAZolam (XANAX) 0.5 MG tablet [Pharmacy Med Name: ALPRAZOLAM 0.5 MG TABLET] 30 tablet 1    Sig: TAKE 1 TABLET BY MOUTH EVERY DAY AS NEEDED FOR ANXIETY      Not Delegated - Psychiatry:  Anxiolytics/Hypnotics Failed - 02/26/2020 11:46 AM      Failed - This refill cannot be delegated      Failed - Urine Drug Screen completed in last 360 days.      Failed - Valid encounter within last 6 months    Recent Outpatient Visits           6 months ago Lower abdominal pain   [facility] at [facility], [place], [place]   7 months ago Skin lesion of scalp   [facility] at [facility], [place], MD   1 year ago Type 2 diabetes mellitus with diabetic polyneuropathy, with long-term current use of insulin [facility])   [facility] at [facility], Eilleen Kempf, MD   1 year ago Situational anxiety   [facility] at [place], Eilleen Kempf, MD   1 year ago Other polyneuropathy   [facility] [place], Asencion Partridge, MD                  Requested Prescriptions  Pending Prescriptions Disp Refills   ALPRAZolam (XANAX) 0.5 MG tablet [Pharmacy Med Name: ALPRAZOLAM 0.5 MG TABLET] 30 tablet 1    Sig: TAKE 1 TABLET BY MOUTH EVERY DAY AS NEEDED FOR ANXIETY      Not Delegated - Psychiatry:  Anxiolytics/Hypnotics Failed - 02/26/2020 11:46 AM      Failed - This refill cannot be delegated      Failed - Urine Drug Screen completed in last 360 days.      Failed - Valid encounter within last 6 months    Recent Outpatient Visits           6 months ago Lower abdominal pain   [facility] at [facility], Eilleen Kempf, MD   7 months ago Skin lesion of scalp   Primary Care at [facility],  [place], [place]   1 year ago Type 2 diabetes mellitus with diabetic polyneuropathy, with long-term current use of insulin [facility])   Primary Care at [facility], Eilleen Kempf, MD   1 year ago Situational anxiety   [facility] at [place], Eilleen Kempf, MD   1 year ago Other polyneuropathy   [facility] at [place], Asencion Partridge, MD

## 2020-02-28 ENCOUNTER — Other Ambulatory Visit: Payer: Self-pay

## 2020-02-28 ENCOUNTER — Ambulatory Visit: Payer: BC Managed Care – PPO | Admitting: Emergency Medicine

## 2020-02-28 ENCOUNTER — Encounter: Payer: Self-pay | Admitting: Emergency Medicine

## 2020-02-28 ENCOUNTER — Telehealth: Payer: Self-pay | Admitting: *Deleted

## 2020-02-28 ENCOUNTER — Telehealth: Payer: Self-pay | Admitting: Emergency Medicine

## 2020-02-28 VITALS — BP 153/75 | HR 97 | Temp 98.4°F | Resp 18 | Ht 75.0 in | Wt >= 6400 oz

## 2020-02-28 DIAGNOSIS — G4733 Obstructive sleep apnea (adult) (pediatric): Secondary | ICD-10-CM

## 2020-02-28 DIAGNOSIS — E1159 Type 2 diabetes mellitus with other circulatory complications: Secondary | ICD-10-CM

## 2020-02-28 DIAGNOSIS — E785 Hyperlipidemia, unspecified: Secondary | ICD-10-CM

## 2020-02-28 DIAGNOSIS — I872 Venous insufficiency (chronic) (peripheral): Secondary | ICD-10-CM

## 2020-02-28 DIAGNOSIS — I152 Hypertension secondary to endocrine disorders: Secondary | ICD-10-CM

## 2020-02-28 DIAGNOSIS — E1142 Type 2 diabetes mellitus with diabetic polyneuropathy: Secondary | ICD-10-CM

## 2020-02-28 DIAGNOSIS — E1169 Type 2 diabetes mellitus with other specified complication: Secondary | ICD-10-CM | POA: Diagnosis not present

## 2020-02-28 DIAGNOSIS — I10 Essential (primary) hypertension: Secondary | ICD-10-CM

## 2020-02-28 DIAGNOSIS — Z6841 Body Mass Index (BMI) 40.0 and over, adult: Secondary | ICD-10-CM

## 2020-02-28 MED ORDER — LEVOTHYROXINE SODIUM 137 MCG PO CAPS: ORAL_CAPSULE | ORAL | 3 refills | Status: DC

## 2020-02-28 MED ORDER — GLUCOSE BLOOD VI STRP: ORAL_STRIP | 1 refills | Status: AC

## 2020-02-28 MED ORDER — BD PEN NEEDLE ORIGINAL U/F 29G X 12.7MM MISC: 3 refills | Status: AC

## 2020-02-28 NOTE — Telephone Encounter (Signed)
Pt was seen  today he lefft paperwork to be completed by provider. form is called 7A disability,medical report for disability review. Pt is also  needing medical  documentation supporting diagnosis for the  review board. This documentation needs to be faxed along with paperwork.

## 2020-02-28 NOTE — Patient Instructions (Addendum)
   If you have lab work done today you will be contacted with your lab results within the next 2 weeks.  If you have not heard from us then please contact us. The fastest way to get your results is to register for My Chart.   IF you received an x-ray today, you will receive an invoice from [facility]. Please contact [facility] at [phone] with questions or concerns regarding your invoice.   IF you received labwork today, you will receive an invoice from [facility]. Please contact [facility] at [phone] with questions or concerns regarding your invoice.   Our billing staff will not be able to assist you with questions regarding bills from these companies.  You will be contacted with the lab results as soon as they are available. The fastest way to get your results is to activate your My Chart account. Instructions are located on the last page of this paperwork. If you have not heard from us regarding the results in 2 weeks, please contact this office.      Diabetes Mellitus and Nutrition, Adult When you have diabetes (diabetes mellitus), it is very important to have healthy eating habits because your blood sugar (glucose) levels are greatly affected by what you eat and drink. Eating healthy foods in the appropriate amounts, at about the same times every day, can help you:  Control your blood glucose.  Lower your risk of heart disease.  Improve your blood pressure.  Reach or maintain a healthy weight. Every person with diabetes is different, and each person has different needs for a meal plan. Your health care provider may recommend that you work with a diet and nutrition specialist (dietitian) to make a meal plan that is best for you. Your meal plan may vary depending on factors such as:  The calories you need.  The medicines you take.  Your weight.  Your blood glucose, blood pressure, and cholesterol levels.  Your activity level.  Other health  conditions you have, such as heart or kidney disease. How do carbohydrates affect me? Carbohydrates, also called carbs, affect your blood glucose level more than any other type of food. Eating carbs naturally raises the amount of glucose in your blood. Carb counting is a method for keeping track of how many carbs you eat. Counting carbs is important to keep your blood glucose at a healthy level, especially if you use insulin or take certain oral diabetes medicines. It is important to know how many carbs you can safely have in each meal. This is different for every person. Your dietitian can help you calculate how many carbs you should have at each meal and for each snack. Foods that contain carbs include:  Bread, cereal, rice, pasta, and crackers.  Potatoes and corn.  Peas, beans, and lentils.  Milk and yogurt.  Fruit and juice.  Desserts, such as cakes, cookies, ice cream, and candy. How does alcohol affect me? Alcohol can cause a sudden decrease in blood glucose (hypoglycemia), especially if you use insulin or take certain oral diabetes medicines. Hypoglycemia can be a life-threatening condition. Symptoms of hypoglycemia (sleepiness, dizziness, and confusion) are similar to symptoms of having too much alcohol. If your health care provider says that alcohol is safe for you, follow these guidelines:  Limit alcohol intake to no more than 1 drink per day for nonpregnant women and 2 drinks per day for men. One drink equals 12 oz of beer, 5 oz of wine, or 1 oz of hard   liquor.  Do not drink on an empty stomach.  Keep yourself hydrated with water, diet soda, or unsweetened iced tea.  Keep in mind that regular soda, juice, and other mixers may contain a lot of sugar and must be counted as carbs. What are tips for following this plan?  Reading food labels  Start by checking the serving size on the "Nutrition Facts" label of packaged foods and drinks. The amount of calories, carbs, fats, and  other nutrients listed on the label is based on one serving of the item. Many items contain more than one serving per package.  Check the total grams (g) of carbs in one serving. You can calculate the number of servings of carbs in one serving by dividing the total carbs by 15. For example, if a food has 30 g of total carbs, it would be equal to 2 servings of carbs.  Check the number of grams (g) of saturated and trans fats in one serving. Choose foods that have low or no amount of these fats.  Check the number of milligrams (mg) of salt (sodium) in one serving. Most people should limit total sodium intake to less than 2,300 mg per day.  Always check the nutrition information of foods labeled as "low-fat" or "nonfat". These foods may be higher in added sugar or refined carbs and should be avoided.  Talk to your dietitian to identify your daily goals for nutrients listed on the label. Shopping  Avoid buying canned, premade, or processed foods. These foods tend to be high in fat, sodium, and added sugar.  Shop around the outside edge of the grocery store. This includes fresh fruits and vegetables, bulk grains, fresh meats, and fresh dairy. Cooking  Use low-heat cooking methods, such as baking, instead of high-heat cooking methods like deep frying.  Cook using healthy oils, such as olive, canola, or sunflower oil.  Avoid cooking with butter, cream, or high-fat meats. Meal planning  Eat meals and snacks regularly, preferably at the same times every day. Avoid going long periods of time without eating.  Eat foods high in fiber, such as fresh fruits, vegetables, beans, and whole grains. Talk to your dietitian about how many servings of carbs you can eat at each meal.  Eat 4-6 ounces (oz) of lean protein each day, such as lean meat, chicken, fish, eggs, or tofu. One oz of lean protein is equal to: ? 1 oz of meat, chicken, or fish. ? 1 egg. ?  cup of tofu.  Eat some foods each day that  contain healthy fats, such as avocado, nuts, seeds, and fish. Lifestyle  Check your blood glucose regularly.  Exercise regularly as told by your health care provider. This may include: ? 150 minutes of moderate-intensity or vigorous-intensity exercise each week. This could be brisk walking, biking, or water aerobics. ? Stretching and doing strength exercises, such as yoga or weightlifting, at least 2 times a week.  Take medicines as told by your health care provider.  Do not use any products that contain nicotine or tobacco, such as cigarettes and e-cigarettes. If you need help quitting, ask your health care provider.  Work with a counselor or diabetes educator to identify strategies to manage stress and any emotional and social challenges. Questions to ask a health care provider  Do I need to meet with a diabetes educator?  Do I need to meet with a dietitian?  What number can I call if I have questions?  When are the best   times to check my blood glucose? Where to find more information:  American Diabetes Association: diabetes.org  Academy of Nutrition and Dietetics: www.eatright.org  National Institute of Diabetes and Digestive and Kidney Diseases (NIH): www.niddk.nih.gov Summary  A healthy meal plan will help you control your blood glucose and maintain a healthy lifestyle.  Working with a diet and nutrition specialist (dietitian) can help you make a meal plan that is best for you.  Keep in mind that carbohydrates (carbs) and alcohol have immediate effects on your blood glucose levels. It is important to count carbs and to use alcohol carefully. This information is not intended to replace advice given to you by your health care provider. Make sure you discuss any questions you have with your health care provider. Document Revised: 07/15/2017 Document Reviewed: 09/06/2016 Elsevier Patient Education  2020 [employer]  

## 2020-02-28 NOTE — Telephone Encounter (Signed)
Pt is returning a call from [place]. Pt would like a call back when possible. Please advise.

## 2020-02-28 NOTE — Progress Notes (Signed)
Johnny Andrews 59 y.o.   Chief Complaint  Patient presents with  . Knee Pain    and disability paper work    HISTORY OF PRESENT ILLNESS: This is a 59 y.o. male with multiple chronic medical problems. Needs new disability forms filled out. No other complaints or medical concerns today.  HPI   Prior to Admission medications   Medication Sig Start Date End Date Taking? Authorizing Provider  ALPRAZolam Prudy Feeler) 0.5 MG tablet TAKE 1 TABLET BY MOUTH EVERY DAY AS NEEDED FOR ANXIETY 02/27/20  Yes Aidenn Skellenger, Eilleen Kempf, MD  diclofenac (VOLTAREN) 75 MG EC tablet Take 1 tablet (75 mg total) by mouth 2 (two) times daily. 12/05/19  Yes Yides Saidi, Eilleen Kempf, MD  diltiazem [facility]) 180 MG 24 hr capsule Take 180 mg by mouth daily. 02/10/17  Yes [provider]  empagliflozin (JARDIANCE) 25 MG TABS tablet Take 25 mg by mouth daily.   Yes [provider]  insulin aspart (NOVOLOG FLEXPEN) 100 UNIT/ML FlexPen Inject 75 Units into the skin 3 (three) times daily with meals. 30 units am and 50 units pm   Yes [provider]  Levothyroxine Sodium 137 MCG CAPS levothyroxine 137 mcg capsule  Take 2 capsules every day by oral route. 02/28/20  Yes Shanicka Oldenkamp, Eilleen Kempf, MD  lisinopril-hydrochlorothiazide (PRINZIDE,ZESTORETIC) 20-12.5 MG tablet Take 1 tablet by mouth daily.   Yes [provider]  metFORMIN (GLUCOPHAGE-XR) 500 MG 24 hr tablet Take 1,000 mg by mouth 2 (two) times daily.  09/15/15  Yes [provider]  simvastatin (ZOCOR) 20 MG tablet Take 20 mg by mouth daily.   Yes [provider]  topiramate (TOPAMAX) 50 MG tablet TAKE 2 TABLETS (100 MG TOTAL) BY MOUTH AT BEDTIME. 12/03/19  Yes Dorothee Napierkowski, Eilleen Kempf, MD  traZODone (DESYREL) 100 MG tablet TAKE 1 TABLET BY MOUTH EVERYDAY AT BEDTIME 08/19/19  Yes Monaye Blackie, [place], MD  TRESIBA FLEXTOUCH 200 UNIT/ML SOPN INJECT 100 UNITS TWICE A DAY 12/17/16  Yes [provider]  glucose blood (ONE TOUCH  ULTRA TEST) test strip Two times a day for ULTRA 2 ONE TOUCH 02/28/20   Georgina Quint, MD  Insulin Pen Needle (BD ULTRA-FINE PEN NEEDLES) 29G X 12.7MM MISC USE TWO CHECK BLOOD SUGAR TWICE A DAY. APPOINTMENT NEEDED FOR FURTHER REFILLS 02/28/20   Georgina Quint, MD  Respiratory Therapy Supplies (ADULT MASK) MISC 2 Devices by Does not apply route once. 11/24/11   Oretha Milch, MD    No Known Allergies  Patient Active Problem List   Diagnosis Date Noted  . Chronic venous insufficiency 07/25/2019  . Skin lesion of scalp 07/25/2019  . Carpal tunnel syndrome of right wrist 04/24/2018  . Diabetic peripheral neuropathy (HCC) 04/24/2018  . Morbid obesity (HCC) 01/27/2018  . Osteoarthritis of left knee 12/16/2017  . Osteoarthritis of right knee 12/16/2017  . Family history of prostate cancer 03/06/2017  . Pure hypercholesterolemia 09/13/2016  . Lumbar degenerative disc disease 07/22/2016  . Spinal stenosis of lumbar region 11/03/2015  . Abnormality of gait 09/15/2015  . Restrictive lung disease 05/05/2015  . 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 Hypogonadism male 02/05/2013  . Elevated PSA 11/30/2011  . OTHER DISEASES OF LUNG NOT ELSEWHERE CLASSIFIED 07/20/2010  . GOUT 04/17/2010  . SMOKER 04/17/2010  . Sleep apnea 08/22/2009  . Hypothyroidism 09/19/2007  . Dyslipidemia associated with type 2 diabetes mellitus (HCC) 09/19/2007  . Hypertension associated with diabetes (HCC) 09/19/2007  . Osteoarthritis 09/19/2007    Past  Medical History:  Diagnosis Date  . ALCOHOL USE 10/24/2008  . ANXIETY 09/19/2007  . Cough 05/22/2008  . DIABETES MELLITUS, TYPE II 09/19/2007  . External thrombosed hemorrhoids 08/23/2008  . GOUT 04/17/2010  . HYPERLIPIDEMIA 09/19/2007  . HYPERTENSION 09/19/2007  . HYPOTHYROIDISM 09/19/2007  . Leg pain   . OSTEOARTHRITIS 09/19/2007  . OTHER DISEASES OF LUNG NOT ELSEWHERE CLASSIFIED 07/20/2010  . Rash and other nonspecific skin eruption 09/19/2007  . Restrictive  lung disease   . SCROTAL ABSCESS 05/27/2010  . Sebaceous cyst 08/01/2008  . SLEEP APNEA 08/22/2009  . SMOKER 04/17/2010    Past Surgical History:  Procedure Laterality Date  . COLONOSCOPY    . NASAL SEPTUM SURGERY      Social History   Socioeconomic History  . Marital status: Married    Spouse name: Not on file  . Number of children: 2  . Years of education: BS  . Highest education level: Not on file  Occupational History  . Occupation: Software engineerHVAC    Employer: [employer]  Tobacco Use  . Smoking status: Former Smoker    Packs/day: 0.50    Years: 30.00    Pack years: 15.00    Quit date: 11/05/2012    Years since quitting: 7.3  . Smokeless tobacco: Never Used  . Tobacco comment: 30 years at most off and on smoking 03/14/15  Substance and Sexual Activity  . Alcohol use: Yes    Alcohol/week: 0.0 standard drinks    Comment: 2 drinks per day  . Drug use: Yes    Comment: Occasional Marijuana use  . Sexual activity: Not on file  Other Topics Concern  . Not on file  Social History Narrative   Originally from [place]. [place] lived in [place], [place], & [place] since 1993. 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 Currently has a dog & cats. Previously had a parrot (conure) 12 years ago for 1 year but in same house.  Has also had excessive exposure to pigeon feces. He has had very brief exposure to asbestos around 2000. No hot tub exposure. He has inhaled chemical fumes/gases/refridgerants through his work.      Marital status: married x 26 years      Children: 2 children (19, [id number]); no grandchildren      Lives: Lives at home with his wife      Employment: [employer]      Tobacco: none; quit in 2013; smoked x 1/2 ppd x 20 years      Alcohol:  10 drinks; usually 2 drinks per day and then skip two days.      Drugs:  None      Exercise: none; job is physically demanding      Right-handed.   1 cup caffeine daily.   Social  Determinants of Health   Financial Resource Strain:   . Difficulty of Paying Living Expenses:   Food Insecurity:   . Worried About Programme researcher, broadcasting/film/videounning Out of Food in the Last Year:   . Baristaan Out of Food in the Last Year:   Transportation Needs:   . Freight forwarderLack of Transportation (Medical):   Marland Kitchen. Lack of Transportation (Non-Medical):   Physical Activity:   . Days of Exercise per Week:   . Minutes of Exercise per Session:   Stress:   . Feeling of Stress :   Social Connections:   . Frequency of Communication with Friends and  Family:   . Frequency of Social Gatherings with Friends and Family:   . Attends Religious Services:   . Active Member of Clubs or Organizations:   . Attends Banker Meetings:   Marland Kitchen Marital Status:   Intimate Partner Violence:   . Fear of Current or Ex-Partner:   . Emotionally Abused:   Marland Kitchen Physically Abused:   . Sexually Abused:     Family History  Problem Relation Age of Onset  . Breast cancer Mother        Breast Cancer  . Parkinson's disease Mother   . Heart disease Father        CABG, stenting cardiac  . Cancer Brother 15       prostate cancer  . Lung disease Neg Hx   . Autoimmune disease Neg Hx      Review of Systems  Constitutional: Negative.  Negative for chills and fever.  HENT: Negative.  Negative for congestion and sore throat.   Respiratory: Negative.  Negative for cough and shortness of breath.   Cardiovascular: Negative.  Negative for chest pain and palpitations.  Gastrointestinal: Negative.  Negative for abdominal pain, blood in stool, diarrhea, melena, nausea and vomiting.  Genitourinary: Negative.  Negative for dysuria and hematuria.  Musculoskeletal: Positive for back pain and joint pain.  Skin: Negative.  Negative for rash.  Neurological: Negative for dizziness and headaches.  All other systems reviewed and are negative.  Today's Vitals   02/28/20 1112  BP: (!) 153/75  Pulse: 97  Resp: 18  Temp: 98.4 F (36.9 C)  TempSrc: Temporal    SpO2: 94%  Weight: (!) 426 lb (193.2 kg)  Height: 6\' 3"  (1.905 m)   Body mass index is 53.25 kg/m.   Physical Exam Vitals reviewed.  Constitutional:      Appearance: He is obese.  HENT:     Head: Normocephalic.  Eyes:     Extraocular Movements: Extraocular movements intact.     Pupils: Pupils are equal, round, and reactive to light.  Cardiovascular:     Rate and Rhythm: Normal rate.  Pulmonary:     Effort: Pulmonary effort is normal.  Skin:    General: Skin is warm and dry.  Neurological:     General: No focal deficit present.     Mental Status: He is alert and oriented to person, place, and time.  Psychiatric:        Mood and Affect: Mood normal.        Behavior: Behavior normal.    A total of 30 minutes was spent with the patient, greater than 50% of which was in counseling/coordination of care regarding multiple chronic medical conditions, review of all medications, review of most recent office visit notes, review of most recent blood work results, filling out paperwork, diet and nutrition, prognosis and need for follow-up   ASSESSMENT & PLAN: [place] was seen today for knee pain.  Diagnoses and all orders for this visit:  Hypertension associated with diabetes (HCC)  Dyslipidemia associated with type 2 diabetes mellitus (HCC)  Obstructive sleep apnea syndrome  Diabetic peripheral neuropathy (HCC)  Morbid obesity (HCC)  Chronic venous insufficiency  Body mass index (BMI) of 50-59.9 in adult [facility])  Other orders -     Levothyroxine Sodium 137 MCG CAPS; levothyroxine 137 mcg capsule  Take 2 capsules every day by oral route. -     glucose blood (ONE TOUCH ULTRA TEST) test strip; Two times a day for ULTRA 2 ONE  TOUCH -     Insulin Pen Needle (BD ULTRA-FINE PEN NEEDLES) 29G X 12.7MM MISC; USE TWO CHECK BLOOD SUGAR TWICE A DAY. APPOINTMENT NEEDED FOR FURTHER REFILLS    Patient Instructions       If you have lab work done today you will be contacted  with your lab results within the next 2 weeks.  If you have not heard from [place] then please contact us. The fastest way to get your results is to register for My Chart.   IF you received an x-ray today, you will receive an invoice from [facility]. Please contact [facility] at [phone] with questions or concerns regarding your invoice.   IF you received labwork today, you will receive an invoice from [facility]. Please contact [facility] at [phone] with questions or concerns regarding your invoice.   Our billing staff will not be able to assist you with questions regarding bills from these companies.  You will be contacted with the lab results as soon as they are available. The fastest way to get your results is to activate your My Chart account. Instructions are located on the last page of this paperwork. If you have not heard from [place] regarding the results in 2 weeks, please contact this office.     Diabetes Mellitus and Nutrition, Adult When you have diabetes (diabetes mellitus), it is very important to have healthy eating habits because your blood sugar (glucose) levels are greatly affected by what you eat and drink. Eating healthy foods in the appropriate amounts, at about the same times every day, can help you:  Control your blood glucose.  Lower your risk of heart disease.  Improve your blood pressure.  Reach or maintain a healthy weight. Every person with diabetes is different, and each person has different needs for a meal plan. Your health care provider may recommend that you work with a diet and nutrition specialist (dietitian) to make a meal plan that is best for you. Your meal plan may vary depending on factors such as:  The calories you need.  The medicines you take.  Your weight.  Your blood glucose, blood pressure, and cholesterol levels.  Your activity level.  Other health conditions you have, such as heart or kidney disease. How do  carbohydrates affect me? Carbohydrates, also called carbs, affect your blood glucose level more than any other type of food. Eating carbs naturally raises the amount of glucose in your blood. Carb counting is a method for keeping track of how many carbs you eat. Counting carbs is important to keep your blood glucose at a healthy level, especially if you use insulin or take certain oral diabetes medicines. It is important to know how many carbs you can safely have in each meal. This is different for every person. Your dietitian can help you calculate how many carbs you should have at each meal and for each snack. Foods that contain carbs include:  Bread, cereal, rice, pasta, and crackers.  Potatoes and corn.  Peas, beans, and lentils.  Milk and yogurt.  Fruit and juice.  Desserts, such as cakes, cookies, ice cream, and candy. How does alcohol affect me? Alcohol can cause a sudden decrease in blood glucose (hypoglycemia), especially if you use insulin or take certain oral diabetes medicines. Hypoglycemia can be a life-threatening condition. Symptoms of hypoglycemia (sleepiness, dizziness, and confusion) are similar to symptoms of having too much alcohol. If your health care provider says that alcohol is safe for you, follow these guidelines:  Limit alcohol intake to no more than 1 drink per day for nonpregnant women and 2 drinks per day for men. One drink equals 12 oz of beer, 5 oz of wine, or 1 oz of hard liquor.  Do not drink on an empty stomach.  Keep yourself hydrated with water, diet soda, or unsweetened iced tea.  Keep in mind that regular soda, juice, and other mixers may contain a lot of sugar and must be counted as carbs. What are tips for following this plan?  Reading food labels  Start by checking the serving size on the "Nutrition Facts" label of packaged foods and drinks. The amount of calories, carbs, fats, and other nutrients listed on the label is based on one serving of  the item. Many items contain more than one serving per package.  Check the total grams (g) of carbs in one serving. You can calculate the number of servings of carbs in one serving by dividing the total carbs by 15. For example, if a food has 30 g of total carbs, it would be equal to 2 servings of carbs.  Check the number of grams (g) of saturated and trans fats in one serving. Choose foods that have low or no amount of these fats.  Check the number of milligrams (mg) of salt (sodium) in one serving. Most people should limit total sodium intake to less than 2,300 mg per day.  Always check the nutrition information of foods labeled as "low-fat" or "nonfat". These foods may be higher in added sugar or refined carbs and should be avoided.  Talk to your dietitian to identify your daily goals for nutrients listed on the label. Shopping  Avoid buying canned, premade, or processed foods. These foods tend to be high in fat, sodium, and added sugar.  Shop around the outside edge of the grocery store. This includes fresh fruits and vegetables, bulk grains, fresh meats, and fresh dairy. Cooking  Use low-heat cooking methods, such as baking, instead of high-heat cooking methods like deep frying.  Cook using healthy oils, such as olive, canola, or sunflower oil.  Avoid cooking with butter, cream, or high-fat meats. Meal planning  Eat meals and snacks regularly, preferably at the same times every day. Avoid going long periods of time without eating.  Eat foods high in fiber, such as fresh fruits, vegetables, beans, and whole grains. Talk to your dietitian about how many servings of carbs you can eat at each meal.  Eat 4-6 ounces (oz) of lean protein each day, such as lean meat, chicken, fish, eggs, or tofu. One oz of lean protein is equal to: ? 1 oz of meat, chicken, or fish. ? 1 egg. ?  cup of tofu.  Eat some foods each day that contain healthy fats, such as avocado, nuts, seeds, and  fish. Lifestyle  Check your blood glucose regularly.  Exercise regularly as told by your health care provider. This may include: ? 150 minutes of moderate-intensity or vigorous-intensity exercise each week. This could be brisk walking, biking, or water aerobics. ? Stretching and doing strength exercises, such as yoga or weightlifting, at least 2 times a week.  Take medicines as told by your health care provider.  Do not use any products that contain nicotine or tobacco, such as cigarettes and e-cigarettes. If you need help quitting, ask your health care provider.  Work with a Veterinary surgeon or diabetes educator to identify strategies to manage stress and any emotional and social challenges. Questions to ask a  health care provider  Do I need to meet with a diabetes educator?  Do I need to meet with a dietitian?  What number can I call if I have questions?  When are the best times to check my blood glucose? Where to find more information:  American Diabetes Association: diabetes.org  Academy of Nutrition and Dietetics: www.eatright.[employer] of Diabetes and Digestive and Kidney Diseases (NIH): [employer] Summary  A healthy meal plan will help you control your blood glucose and maintain a healthy lifestyle.  Working with a diet and nutrition specialist (dietitian) can help you make a meal plan that is best for you.  Keep in mind that carbohydrates (carbs) and alcohol have immediate effects on your blood glucose levels. It is important to count carbs and to use alcohol carefully. This information is not intended to replace advice given to you by your health care provider. Make sure you discuss any questions you have with your health care provider. Document Revised: 07/15/2017 Document Reviewed: 09/06/2016 Elsevier Patient Education  2020 Elsevier Inc.      Agustina Caroli, MD [facility]

## 2020-02-28 NOTE — Telephone Encounter (Signed)
Faxed completed disability paper work to [employer] [phone]) today. I will call patient to pick up forms after faxing, copies to be scan into the chart. Confirmation page 2:49 pm.

## 2020-02-28 NOTE — Telephone Encounter (Signed)
Pt is calling back to make sure Clinical sees this message today    .    Please reach put to patient if possible

## 2020-02-28 NOTE — Telephone Encounter (Signed)
Left a msg on patient machine that his forms has been faxed and ready for pick up at the front desk

## 2020-02-29 ENCOUNTER — Telehealth: Payer: Self-pay

## 2020-02-29 NOTE — Telephone Encounter (Signed)
Patient stated he already left the paperwork to be completed by you when he was seen. Patient wanted to add that he need a medial documentation supporting diagnosis for the review board

## 2020-02-29 NOTE — Telephone Encounter (Signed)
Pt came in today to collect a copy of disability pt was provided copy.

## 2020-03-03 ENCOUNTER — Telehealth: Payer: Self-pay | Admitting: *Deleted

## 2020-03-03 NOTE — Telephone Encounter (Signed)
Per Johnny Andrews patient picked up copy of Disability forms on Friday. Also, forms faxed on 02/28/2020 to [facility] [employer]. Confirmation page 2:49 pm.

## 2020-03-04 ENCOUNTER — Other Ambulatory Visit: Payer: Self-pay | Admitting: *Deleted

## 2020-03-04 ENCOUNTER — Other Ambulatory Visit: Payer: Self-pay

## 2020-03-04 DIAGNOSIS — E034 Atrophy of thyroid (acquired): Secondary | ICD-10-CM

## 2020-03-04 MED ORDER — LEVOTHYROXINE SODIUM 137 MCG PO TABS: 274.0000 ug | ORAL_TABLET | Freq: Every day | ORAL | 3 refills | Status: DC

## 2020-03-13 ENCOUNTER — Telehealth: Payer: Self-pay | Admitting: Emergency Medicine

## 2020-03-13 NOTE — Telephone Encounter (Signed)
03/13/2020 - PATIENT CALLED STATING HE NEEDS TO GET A NEW DIABETIC METER. THE ONE TOUCH ULTRA HE HAS HAD FOR ABOUT 6 YEARS AND IS NOT WORKING CORRECTLY. HE WOULD LIKE TO KNOW IF HE CAN GET A FREE METER ANY WHERE? IF NOT DO WE HAVE ANY COUPONS OR VOUCHERS HE CAN USE? HIS PRIMARY PROVIDER IS DR. MIGUEL. BEST PHONE [phone] (CELL)  PHARMACY CHOICE IS [facility] AT [address] [place]

## 2020-03-14 ENCOUNTER — Other Ambulatory Visit: Payer: Self-pay

## 2020-03-14 DIAGNOSIS — Z794 Long term (current) use of insulin: Secondary | ICD-10-CM

## 2020-03-14 DIAGNOSIS — E1142 Type 2 diabetes mellitus with diabetic polyneuropathy: Secondary | ICD-10-CM

## 2020-03-14 MED ORDER — BLOOD GLUCOSE METER KIT: 1.0000 | PACK | Freq: Every day | 1 refills | Status: DC

## 2020-03-14 MED ORDER — BLOOD GLUCOSE METER KIT: 1.0000 | PACK | Freq: Two times a day (BID) | 1 refills | Status: DC

## 2020-03-14 MED ORDER — BLOOD GLUCOSE METER KIT: 1.0000 | PACK | Freq: Two times a day (BID) | 1 refills | Status: AC

## 2020-03-26 ENCOUNTER — Other Ambulatory Visit: Payer: Self-pay | Admitting: Emergency Medicine

## 2020-03-26 DIAGNOSIS — G6289 Other specified polyneuropathies: Secondary | ICD-10-CM

## 2020-03-26 NOTE — Telephone Encounter (Signed)
Requested medication (s) are due for refill today: yes  Requested medication (s) are on the active medication list: yes  Last refill:  4/19//21 #60  Future visit scheduled: yes  Notes to clinic:  Please review for refill. Refill not delegated per protocol    Requested Prescriptions  Pending Prescriptions Disp Refills   topiramate (TOPAMAX) 50 MG tablet [Pharmacy Med Name: TOPIRAMATE 50 MG TABLET] 60 tablet 0    Sig: TAKE 2 TABLETS (100 MG TOTAL) BY MOUTH AT BEDTIME.      Not Delegated - Neurology: Anticonvulsants - topiramate & zonisamide Failed - 03/26/2020 12:33 PM      Failed - This refill cannot be delegated      Passed - Cr in normal range and within 360 days    Creat  Date Value Ref Range Status  09/01/2015 0.62 (L) 0.70 - 1.33 mg/dL Final   Creatinine, Ser  Date Value Ref Range Status  08/29/2019 0.91 0.76 - 1.27 mg/dL Final   Creatinine,U  Date Value Ref Range Status  08/21/2014 40.5 mg/dL Final          Passed - CO2 in normal range and within 360 days    CO2  Date Value Ref Range Status  08/29/2019 23 20 - 29 mmol/L Final          Passed - Valid encounter within last 12 months    Recent Outpatient Visits           3 weeks ago Hypertension associated with diabetes [facility])   Primary Care at [facility], Eilleen Kempf, MD   7 months ago Lower abdominal pain   Primary Care at [place], Eilleen Kempf, MD   8 months ago Skin lesion of scalp   Primary Care at [place], [place], MD   1 year ago Type 2 diabetes mellitus with diabetic polyneuropathy, with long-term current use of insulin [facility])   Primary Care at [facility], Eilleen Kempf, MD   1 year ago Situational anxiety   Primary Care at [facility], Eilleen Kempf, MD       Future Appointments             In 5 months Sagardia, Eilleen Kempf, MD Primary Care at [place], [facility]

## 2020-04-01 ENCOUNTER — Other Ambulatory Visit: Payer: Self-pay | Admitting: Emergency Medicine

## 2020-04-01 DIAGNOSIS — G6289 Other specified polyneuropathies: Secondary | ICD-10-CM

## 2020-04-01 NOTE — Telephone Encounter (Signed)
Requested medication (s) are due for refill today: yes  Requested medication (s) are on the active medication list: yes  Last refill:  03/26/20 #60  Future visit scheduled: yes  Notes to clinic: Refill not delegated per protocol. Pharmacy request for a 90 day supply.    Requested Prescriptions  Pending Prescriptions Disp Refills   topiramate (TOPAMAX) 50 MG tablet [Pharmacy Med Name: TOPIRAMATE 50 MG TABLET] 180 tablet 1    Sig: TAKE 2 TABLETS (100 MG TOTAL) BY MOUTH AT BEDTIME.      Not Delegated - Neurology: Anticonvulsants - topiramate & zonisamide Failed - 04/01/2020 10:28 AM      Failed - This refill cannot be delegated      Passed - Cr in normal range and within 360 days    Creat  Date Value Ref Range Status  09/01/2015 0.62 (L) 0.70 - 1.33 mg/dL Final   Creatinine, Ser  Date Value Ref Range Status  08/29/2019 0.91 0.76 - 1.27 mg/dL Final   Creatinine,U  Date Value Ref Range Status  08/21/2014 40.5 mg/dL Final          Passed - CO2 in normal range and within 360 days    CO2  Date Value Ref Range Status  08/29/2019 23 20 - 29 mmol/L Final          Passed - Valid encounter within last 12 months    Recent Outpatient Visits           1 month ago Hypertension associated with diabetes [facility])   Primary Care at [facility], Eilleen Kempf, MD   7 months ago Lower abdominal pain   Primary Care at [place], [place], MD   8 months ago Skin lesion of scalp   Primary Care at [place], [place], MD   1 year ago Type 2 diabetes mellitus with diabetic polyneuropathy, with long-term current use of insulin [facility])   Primary Care at [facility], Eilleen Kempf, MD   1 year ago Situational anxiety   [facility] Care at [facility], Eilleen Kempf, MD       Future Appointments             In 4 months Sagardia, Eilleen Kempf, MD Primary Care at [place], [facility]

## 2020-04-01 NOTE — Telephone Encounter (Signed)
Patient is requesting a refill of the following medications: Requested Prescriptions   Pending Prescriptions Disp Refills  . topiramate (TOPAMAX) 50 MG tablet [Pharmacy Med Name: TOPIRAMATE 50 MG TABLET] 180 tablet 1    Sig: TAKE 2 TABLETS (100 MG TOTAL) BY MOUTH AT BEDTIME.    Date of patient request: 04/01/2020 Last office visit: 02/28/2020 Date of last refill: 03/26/2020 Last refill amount: 60 tablets  Follow up time period per chart:  Medication was already sent based on the chart.

## 2020-04-12 ENCOUNTER — Other Ambulatory Visit: Payer: Self-pay | Admitting: Emergency Medicine

## 2020-04-12 DIAGNOSIS — M5440 Lumbago with sciatica, unspecified side: Secondary | ICD-10-CM

## 2020-04-24 ENCOUNTER — Other Ambulatory Visit: Payer: Self-pay | Admitting: Emergency Medicine

## 2020-04-24 NOTE — Telephone Encounter (Signed)
Patient is requesting a refill of the following medications: Requested Prescriptions   Pending Prescriptions Disp Refills   traZODone (DESYREL) 100 MG tablet [Pharmacy Med Name: TRAZODONE 100 MG TABLET] 90 tablet 1    Sig: TAKE 1 TABLET BY MOUTH EVERYDAY AT BEDTIME    Date of patient request: 04/24/20 Last office visit: 02/28/20 Date of last refill: 08/19/2019 Last refill amount: 90 RF1 Follow up time period per chart: 08/28/2020

## 2020-04-24 NOTE — Telephone Encounter (Signed)
Requested  medications are  due for refill today yes  Requested medications are on the active medication list yes  Last refill 01/28/2020  Notes to clinic I do not see this med/dx addressed in a visit, please assess.

## 2020-06-02 ENCOUNTER — Encounter: Payer: Self-pay | Admitting: Emergency Medicine

## 2020-06-02 ENCOUNTER — Telehealth: Payer: BC Managed Care – PPO | Admitting: Emergency Medicine

## 2020-06-02 ENCOUNTER — Other Ambulatory Visit: Payer: Self-pay

## 2020-06-02 VITALS — Ht 75.0 in | Wt >= 6400 oz

## 2020-06-02 DIAGNOSIS — E1159 Type 2 diabetes mellitus with other circulatory complications: Secondary | ICD-10-CM

## 2020-06-02 DIAGNOSIS — Z6841 Body Mass Index (BMI) 40.0 and over, adult: Secondary | ICD-10-CM

## 2020-06-02 DIAGNOSIS — E1142 Type 2 diabetes mellitus with diabetic polyneuropathy: Secondary | ICD-10-CM | POA: Diagnosis not present

## 2020-06-02 DIAGNOSIS — Z794 Long term (current) use of insulin: Secondary | ICD-10-CM

## 2020-06-02 DIAGNOSIS — E1169 Type 2 diabetes mellitus with other specified complication: Secondary | ICD-10-CM

## 2020-06-02 DIAGNOSIS — E785 Hyperlipidemia, unspecified: Secondary | ICD-10-CM

## 2020-06-02 DIAGNOSIS — H8113 Benign paroxysmal vertigo, bilateral: Secondary | ICD-10-CM

## 2020-06-02 DIAGNOSIS — G4733 Obstructive sleep apnea (adult) (pediatric): Secondary | ICD-10-CM

## 2020-06-02 DIAGNOSIS — I152 Hypertension secondary to endocrine disorders: Secondary | ICD-10-CM

## 2020-06-02 MED ORDER — MECLIZINE HCL 25 MG PO TABS: 25.0000 mg | ORAL_TABLET | Freq: Three times a day (TID) | ORAL | 0 refills | Status: DC | PRN

## 2020-06-02 NOTE — Patient Instructions (Signed)
° ° ° °  If you have lab work done today you will be contacted with your lab results within the next 2 weeks.  If you have not heard from us then please contact us. The fastest way to get your results is to register for My Chart. ° ° °IF you received an x-ray today, you will receive an invoice from [facility]. Please contact [facility] at [phone] with questions or concerns regarding your invoice.  ° °IF you received labwork today, you will receive an invoice from [facility]. Please contact [facility] at [phone] with questions or concerns regarding your invoice.  ° °Our billing staff will not be able to assist you with questions regarding bills from these companies. ° °You will be contacted with the lab results as soon as they are available. The fastest way to get your results is to activate your My Chart account. Instructions are located on the last page of this paperwork. If you have not heard from us regarding the results in 2 weeks, please contact this office. °  ° ° ° °

## 2020-06-02 NOTE — Progress Notes (Addendum)
Telemedicine Encounter- SOAP NOTE Established Patient Patient: Home  Provider: Office     This telephone encounter was conducted with the patient's (or proxy's) verbal consent via audio telecommunications: yes/no: Yes Patient was instructed to have this encounter in a suitably private space; and to only have persons present to whom they give permission to participate. In addition, patient identity was confirmed by use of name plus two identifiers (DOB and address).  I discussed the limitations, risks, security and privacy concerns of performing an evaluation and management service by telephone and the availability of in person appointments. I also discussed with the patient that there may be a patient responsible charge related to this service. The patient expressed understanding and agreed to proceed.  I spent a total of TIME; 0 MIN TO 60 MIN: 20 minutes talking with the patient or their proxy.  Chief Complaint  Patient presents with  . Dizziness    going on for 3 days and it has come on suddenly. From laying to standing gets more dizzy     Subjective   Johnny Andrews is a 59 y.o. male established patient. Telephone visit today complaining of sudden onset of dizziness and vertigo that started 3 days ago along with nausea.  Symptoms worsened from standing to lying down, room starts spinning, worse with head turning. Denies flulike symptoms, fever or chills, headache, visual problems, or gait problems.  Denies GI bleed.  Denies rectal bleeding or melena. Diabetic and hypertensive.  Has appointment with diabetes Dr. In 2 days.  Had blood work done last Friday.  Results not available for my review.  Denies any other associated symptoms.  Denies any new medications.  No drinking alcohol. No other complaints or medical concerns today.  HPI   Patient Active Problem List   Diagnosis Date Noted  . Chronic venous insufficiency 07/25/2019  . Skin lesion of scalp 07/25/2019  . Carpal  tunnel syndrome of right wrist 04/24/2018  . Diabetic peripheral neuropathy ([place]) 04/24/2018  . Morbid obesity ([place]) 01/27/2018  . Osteoarthritis of left knee 12/16/2017  . Osteoarthritis of right knee 12/16/2017  . Family history of prostate cancer 03/06/2017  . Pure hypercholesterolemia 09/13/2016  . Lumbar degenerative disc disease 07/22/2016  . Spinal stenosis of lumbar region 11/03/2015  . Abnormality of gait 09/15/2015  . Restrictive lung disease 05/05/2015  . 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 Hypogonadism male 02/05/2013  . Elevated PSA 11/30/2011  . OTHER DISEASES OF LUNG NOT ELSEWHERE CLASSIFIED 07/20/2010  . GOUT 04/17/2010  . SMOKER 04/17/2010  . Sleep apnea 08/22/2009  . Hypothyroidism 09/19/2007  . Dyslipidemia associated with type 2 diabetes mellitus ([place]) 09/19/2007  . Hypertension associated with diabetes ([place]) 09/19/2007  . Osteoarthritis 09/19/2007    Past Medical History:  Diagnosis Date  . ALCOHOL USE 10/24/2008  . ANXIETY 09/19/2007  . Cough 05/22/2008  . Diabetes mellitus without complication ([place])    Phreesia 06/02/2020  . DIABETES MELLITUS, TYPE II 09/19/2007  . External thrombosed hemorrhoids 08/23/2008  . GOUT 04/17/2010  . HYPERLIPIDEMIA 09/19/2007  . HYPERTENSION 09/19/2007  . Hypertension    [employer]reesia 06/02/2020  . HYPOTHYROIDISM 09/19/2007  . Leg pain   . OSTEOARTHRITIS 09/19/2007  . OTHER DISEASES OF LUNG NOT ELSEWHERE CLASSIFIED 07/20/2010  . Rash and other nonspecific skin eruption 09/19/2007  . Restrictive lung disease   . SCROTAL ABSCESS 05/27/2010  . Sebaceous cyst 08/01/2008  . SLEEP APNEA 08/22/2009  . Sleep apnea    [employer]reesia 06/02/2020  .  SMOKER 04/17/2010  . Thyroid disease    [employer]reesia 06/02/2020    Current Outpatient Medications  Medication Sig Dispense Refill  . ALPRAZolam (XANAX) 0.5 MG tablet TAKE 1 TABLET BY MOUTH EVERY DAY AS NEEDED FOR ANXIETY 30 tablet 1  . diclofenac (VOLTAREN) 75 MG EC tablet TAKE 1 TABLET BY MOUTH TWICE A DAY  180 tablet 0  . diltiazem (TIAZAC) 180 MG 24 hr capsule Take 180 mg by mouth daily.  6  . insulin aspart (NOVOLOG FLEXPEN) 100 UNIT/ML FlexPen Inject 75 Units into the skin 3 (three) times daily with meals. 30 units am and 50 units pm    . Insulin Pen Needle (BD ULTRA-FINE PEN NEEDLES) 29G X 12.7MM MISC USE TWO CHECK BLOOD SUGAR TWICE A DAY. APPOINTMENT NEEDED FOR FURTHER REFILLS 100 each 3  . levothyroxine (SYNTHROID) 137 MCG tablet Take 2 tablets (274 mcg total) by mouth daily before breakfast. 180 tablet 3  . lisinopril-hydrochlorothiazide (PRINZIDE,ZESTORETIC) 20-12.5 MG tablet Take 1 tablet by mouth daily.    . metFORMIN (GLUCOPHAGE-XR) 500 MG 24 hr tablet Take 1,000 mg by mouth 2 (two) times daily.     Marland Kitchen Respiratory Therapy Supplies (ADULT MASK) MISC 2 Devices by Does not apply route once. 2 each 0  . simvastatin (ZOCOR) 20 MG tablet Take 20 mg by mouth daily.    Marland Kitchen topiramate (TOPAMAX) 50 MG tablet TAKE 2 TABLETS (100 MG TOTAL) BY MOUTH AT BEDTIME. 180 tablet 1  . traZODone (DESYREL) 100 MG tablet TAKE 1 TABLET BY MOUTH EVERYDAY AT BEDTIME 90 tablet 1  . TRESIBA FLEXTOUCH 200 UNIT/ML SOPN INJECT 100 UNITS TWICE A DAY  6  . blood glucose meter kit and supplies 1 each by Other route 2 (two) times daily. (Patient not taking: Reported on 06/02/2020) 1 each 1  . empagliflozin (JARDIANCE) 25 MG TABS tablet Take 25 mg by mouth daily. (Patient not taking: Reported on 06/02/2020)    . glucose blood (ONE TOUCH ULTRA TEST) test strip Two times a day for ULTRA 2 ONE TOUCH (Patient not taking: Reported on 06/02/2020) 100 each 1   No current facility-administered medications for this visit.    Allergies  Allergen Reactions  . Other     Social History   Socioeconomic History  . Marital status: Married    Spouse name: Not on file  . Number of children: 2  . Years of education: BS  . Highest education level: Not on file  Occupational History  . Occupation: Multimedia programmer: [employer]    Tobacco Use  . Smoking status: Former Smoker    Packs/day: 0.50    Years: 30.00    Pack years: 15.00    Quit date: 11/05/2012    Years since quitting: 7.5  . Smokeless tobacco: Never Used  . Tobacco comment: 30 years at most off and on smoking 03/14/15  Substance and Sexual Activity  . Alcohol use: Yes    Alcohol/week: 0.0 standard drinks    Comment: 2 drinks per day  . Drug use: Yes    Comment: Occasional Marijuana use  . Sexual activity: Not on file  Other Topics Concern  . Not on file  Social History Narrative   Originally from [place]. Has lived in [place], [place], [place], & [place] since 1993. 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 Currently has a dog & cats. Previously had a parrot (conure) 12 years ago for  1 year but in same house.  Has also had excessive exposure to pigeon feces. He has had very brief exposure to asbestos around 2000. No hot tub exposure. He has inhaled chemical fumes/gases/refridgerants through his work.      Marital status: married x 26 years      Children: 2 children (19, 33); no grandchildren      Lives: Lives at home with his wife      Employment: [employer]      Tobacco: none; quit in 2013; smoked x 1/2 ppd x 20 years      Alcohol:  10 drinks; usually 2 drinks per day and then skip two days.      Drugs:  None      Exercise: none; job is physically demanding      Right-handed.   1 cup caffeine daily.   Social Determinants of Health   Financial Resource Strain:   . Difficulty of Paying Living Expenses: Not on file  Food Insecurity:   . Worried About Charity fundraiser in the Last Year: Not on file  . Ran Out of Food in the Last Year: Not on file  Transportation Needs:   . Lack of Transportation (Medical): Not on file  . Lack of Transportation (Non-Medical): Not on file  Physical Activity:   . Days of Exercise per Week: Not on file  . Minutes of Exercise per Session: Not on file  Stress:    . Feeling of Stress : Not on file  Social Connections:   . Frequency of Communication with Friends and Family: Not on file  . Frequency of Social Gatherings with Friends and Family: Not on file  . Attends Religious Services: Not on file  . Active Member of Clubs or Organizations: Not on file  . Attends Archivist Meetings: Not on file  . Marital Status: Not on file  Intimate Partner Violence:   . Fear of Current or Ex-Partner: Not on file  . Emotionally Abused: Not on file  . Physically Abused: Not on file  . Sexually Abused: Not on file    Review of Systems  Constitutional: Negative.  Negative for chills and fever.  HENT: Negative.  Negative for congestion and sore throat.   Respiratory: Negative.  Negative for cough and shortness of breath.   Cardiovascular: Negative.  Negative for chest pain and palpitations.  Gastrointestinal: Negative.  Negative for abdominal pain, diarrhea, nausea and vomiting.  Genitourinary: Negative.  Negative for dysuria and hematuria.  Skin: Negative.  Negative for rash.  Neurological: Positive for dizziness. Negative for headaches.  All other systems reviewed and are negative.   Objective  Alert and oriented x3 in no apparent respiratory distress. Vitals as reported by the patient: Today's Vitals   06/02/20 1022  Weight: (!) 420 lb (190.5 kg)  Height: '6\' 3"'  (1.905 m)    There are no diagnoses linked to this encounter. Graeson was seen today for dizziness.  Diagnoses and all orders for this visit:  Benign paroxysmal positional vertigo due to bilateral vestibular disorder -     meclizine (ANTIVERT) 25 MG tablet; Take 1 tablet (25 mg total) by mouth 3 (three) times daily as needed for dizziness.  Body mass index (BMI) of 50-59.9 in adult [facility])  Morbid obesity ([place])  Type 2 diabetes mellitus with diabetic polyneuropathy, with long-term current use of insulin ([place])  Hypertension associated with diabetes ([place])  Dyslipidemia  associated with type 2 diabetes mellitus ([place])  Obstructive sleep apnea syndrome  Diabetic peripheral neuropathy ([place])     I discussed the assessment and treatment plan with the patient. The patient was provided an opportunity to ask questions and all were answered. The patient agreed with the plan and demonstrated an understanding of the instructions.   The patient was advised to call back or seek an in-person evaluation if the symptoms worsen or if the condition fails to improve as anticipated.  I provided 20 minutes of non-face-to-face time during this encounter.  Horald Pollen, MD  Primary Care at [facility]

## 2020-06-09 ENCOUNTER — Ambulatory Visit: Payer: BC Managed Care – PPO | Admitting: Emergency Medicine

## 2020-06-09 ENCOUNTER — Encounter: Payer: Self-pay | Admitting: Emergency Medicine

## 2020-06-09 ENCOUNTER — Telehealth: Payer: Self-pay | Admitting: Emergency Medicine

## 2020-06-09 ENCOUNTER — Other Ambulatory Visit: Payer: Self-pay

## 2020-06-09 VITALS — BP 146/78 | HR 102 | Temp 98.4°F | Ht 75.0 in | Wt >= 6400 oz

## 2020-06-09 DIAGNOSIS — E1169 Type 2 diabetes mellitus with other specified complication: Secondary | ICD-10-CM | POA: Diagnosis not present

## 2020-06-09 DIAGNOSIS — E1142 Type 2 diabetes mellitus with diabetic polyneuropathy: Secondary | ICD-10-CM | POA: Diagnosis not present

## 2020-06-09 DIAGNOSIS — H8113 Benign paroxysmal vertigo, bilateral: Secondary | ICD-10-CM

## 2020-06-09 DIAGNOSIS — E1159 Type 2 diabetes mellitus with other circulatory complications: Secondary | ICD-10-CM

## 2020-06-09 DIAGNOSIS — E785 Hyperlipidemia, unspecified: Secondary | ICD-10-CM

## 2020-06-09 DIAGNOSIS — I152 Hypertension secondary to endocrine disorders: Secondary | ICD-10-CM

## 2020-06-09 DIAGNOSIS — Z794 Long term (current) use of insulin: Secondary | ICD-10-CM

## 2020-06-09 MED ORDER — MECLIZINE HCL 50 MG PO TABS: 50.0000 mg | ORAL_TABLET | Freq: Three times a day (TID) | ORAL | 1 refills | Status: DC | PRN

## 2020-06-09 NOTE — Patient Instructions (Addendum)
   If you have lab work done today you will be contacted with your lab results within the next 2 weeks.  If you have not heard from us then please contact us. The fastest way to get your results is to register for My Chart.   IF you received an x-ray today, you will receive an invoice from [facility]. Please contact [facility] at [phone] with questions or concerns regarding your invoice.   IF you received labwork today, you will receive an invoice from [facility]. Please contact [facility] at [phone] with questions or concerns regarding your invoice.   Our billing staff will not be able to assist you with questions regarding bills from these companies.  You will be contacted with the lab results as soon as they are available. The fastest way to get your results is to activate your My Chart account. Instructions are located on the last page of this paperwork. If you have not heard from us regarding the results in 2 weeks, please contact this office.     Benign Positional Vertigo Vertigo is the feeling that you or your surroundings are moving when they are not. Benign positional vertigo is the most common form of vertigo. This is usually a harmless condition (benign). This condition is positional. This means that symptoms are triggered by certain movements and positions. This condition can be dangerous if it occurs while you are doing something that could cause harm to you or others. This includes activities such as driving or operating machinery. What are the causes? In many cases, the cause of this condition is not known. It may be caused by a disturbance in an area of the inner ear that helps your brain to sense movement and balance. This disturbance can be caused by:  Viral infection (labyrinthitis).  Head injury.  Repetitive motion, such as jumping, dancing, or running. What increases the risk? You are more likely to develop this condition if:  You are a  woman.  You are 50 years of age or older. What are the signs or symptoms? Symptoms of this condition usually happen when you move your head or your eyes in different directions. Symptoms may start suddenly, and usually last for less than a minute. They include:  Loss of balance and falling.  Feeling like you are spinning or moving.  Feeling like your surroundings are spinning or moving.  Nausea and vomiting.  Blurred vision.  Dizziness.  Involuntary eye movement (nystagmus). Symptoms can be mild and cause only minor problems, or they can be severe and interfere with daily life. Episodes of benign positional vertigo may return (recur) over time. Symptoms may improve over time. How is this diagnosed? This condition may be diagnosed based on:  Your medical history.  Physical exam of the head, neck, and ears.  Tests, such as: ? MRI. ? CT scan. ? Eye movement tests. Your health care provider may ask you to change positions quickly while he or she watches you for symptoms of benign positional vertigo, such as nystagmus. Eye movement may be tested with a variety of exams that are designed to evaluate or stimulate vertigo. ? An electroencephalogram (EEG). This records electrical activity in your brain. ? Hearing tests. You may be referred to a health care provider who specializes in ear, nose, and throat (ENT) problems (otolaryngologist) or a provider who specializes in disorders of the nervous system (neurologist). How is this treated?  This condition may be treated in a session in which your   health care provider moves your head in specific positions to adjust your inner ear back to normal. Treatment for this condition may take several sessions. Surgery may be needed in severe cases, but this is rare. In some cases, benign positional vertigo may resolve on its own in 2-4 weeks. Follow these instructions at home: Safety  Move slowly. Avoid sudden body or head movements or certain  positions, as told by your health care provider.  Avoid driving until your health care provider says it is safe for you to do so.  Avoid operating heavy machinery until your health care provider says it is safe for you to do so.  Avoid doing any tasks that would be dangerous to you or others if vertigo occurs.  If you have trouble walking or keeping your balance, try using a cane for stability. If you feel dizzy or unstable, sit down right away.  Return to your normal activities as told by your health care provider. Ask your health care provider what activities are safe for you. General instructions  Take over-the-counter and prescription medicines only as told by your health care provider.  Drink enough fluid to keep your urine pale yellow.  Keep all follow-up visits as told by your health care provider. This is important. Contact a health care provider if:  You have a fever.  Your condition gets worse or you develop new symptoms.  Your family or friends notice any behavioral changes.  You have nausea or vomiting that gets worse.  You have numbness or a "pins and needles" sensation. Get help right away if you:  Have difficulty speaking or moving.  Are always dizzy.  Faint.  Develop severe headaches.  Have weakness in your legs or arms.  Have changes in your hearing or vision.  Develop a stiff neck.  Develop sensitivity to light. Summary  Vertigo is the feeling that you or your surroundings are moving when they are not. Benign positional vertigo is the most common form of vertigo.  The cause of this condition is not known. It may be caused by a disturbance in an area of the inner ear that helps your brain to sense movement and balance.  Symptoms include loss of balance and falling, feeling that you or your surroundings are moving, nausea and vomiting, and blurred vision.  This condition can be diagnosed based on symptoms, physical exam, and other tests, such as  MRI, CT scan, eye movement tests, and hearing tests.  Follow safety instructions as told by your health care provider. You will also be told when to contact your health care provider in case of problems. This information is not intended to replace advice given to you by your health care provider. Make sure you discuss any questions you have with your health care provider. Document Revised: 01/11/2018 Document Reviewed: 01/11/2018 Elsevier Patient Education  2020 [employer]  

## 2020-06-09 NOTE — Progress Notes (Signed)
Johnny Andrews 59 y.o.   Chief Complaint  Patient presents with  . Dizziness    10 days     HISTORY OF PRESENT ILLNESS: This is a 59 y.o. male complaining of positional vertigo for 10 days.  Telemedicine visit with me last week.   Started on meclizine 25 mg 3 times a day with some improvement.  No new symptoms or change in clinical picture.  Has history of diabetes and saw his endocrinologist last week 2 days after I spoke to him.  Hemoglobin A1c down to 7.0 and lipid profile improved per patient. No other complaints or medical concerns today.  HPI   Prior to Admission medications   Medication Sig Start Date End Date Taking? Authorizing Provider  ALPRAZolam Duanne Moron) 0.5 MG tablet TAKE 1 TABLET BY MOUTH EVERY DAY AS NEEDED FOR ANXIETY 02/27/20  Yes Ishanvi Mcquitty, Ines Bloomer, MD  blood glucose meter kit and supplies 1 each by Other route 2 (two) times daily. 03/14/20  Yes Horald Pollen, MD  diclofenac (VOLTAREN) 75 MG EC tablet TAKE 1 TABLET BY MOUTH TWICE A DAY 04/13/20  Yes Devonta Blanford, Ines Bloomer, MD  diltiazem [facility]) 180 MG 24 hr capsule Take 180 mg by mouth daily. 02/10/17  Yes [provider]  glucose blood (ONE TOUCH ULTRA TEST) test strip Two times a day for ULTRA 2 ONE TOUCH 02/28/20  Yes Deaken Jurgens, Ines Bloomer, MD  insulin aspart (NOVOLOG FLEXPEN) 100 UNIT/ML FlexPen Inject 75 Units into the skin 2 (two) times daily before a meal. 30 units am and 50 units pm   Yes [provider]  Insulin Pen Needle (BD ULTRA-FINE PEN NEEDLES) 29G X 12.7MM MISC USE TWO CHECK BLOOD SUGAR TWICE A DAY. APPOINTMENT NEEDED FOR FURTHER REFILLS 02/28/20  Yes Horald Pollen, MD  levothyroxine (SYNTHROID) 137 MCG tablet Take 2 tablets (274 mcg total) by mouth daily before breakfast. 03/04/20 06/09/20 Yes Laquana Villari, Ines Bloomer, MD  lisinopril-hydrochlorothiazide (PRINZIDE,ZESTORETIC) 20-12.5 MG tablet Take 1 tablet by mouth daily.   Yes [provider]  meclizine (ANTIVERT) 25  MG tablet Take 1 tablet (25 mg total) by mouth 3 (three) times daily as needed for dizziness. 06/02/20  Yes Bernestine Holsapple, Ines Bloomer, MD  metFORMIN (GLUCOPHAGE-XR) 500 MG 24 hr tablet Take 1,000 mg by mouth 2 (two) times daily.  09/15/15  Yes [provider]  Respiratory Therapy Supplies (ADULT MASK) MISC 2 Devices by Does not apply route once. 11/24/11  Yes Rigoberto Noel, MD  simvastatin (ZOCOR) 20 MG tablet Take 20 mg by mouth daily.   Yes [provider]  topiramate (TOPAMAX) 50 MG tablet TAKE 2 TABLETS (100 MG TOTAL) BY MOUTH AT BEDTIME. 04/01/20  Yes Adda Stokes, Ines Bloomer, MD  traZODone (DESYREL) 100 MG tablet TAKE 1 TABLET BY MOUTH EVERYDAY AT BEDTIME 04/24/20  Yes Kennis Wissmann, [place], MD  TRESIBA FLEXTOUCH 200 UNIT/ML SOPN INJECT 100 UNITS TWICE A DAY 12/17/16  Yes [provider]  empagliflozin (JARDIANCE) 25 MG TABS tablet Take 25 mg by mouth daily. Patient not taking: Reported on 06/02/2020    [provider]    Allergies  Allergen Reactions  . Other     Patient Active Problem List   Diagnosis Date Noted  . Chronic venous insufficiency 07/25/2019  . Skin lesion of scalp 07/25/2019  . Carpal tunnel syndrome of right wrist 04/24/2018  . Diabetic peripheral neuropathy ([place]) 04/24/2018  . Morbid obesity ([place]) 01/27/2018  . Osteoarthritis of left knee 12/16/2017  . Osteoarthritis of right  knee 12/16/2017  . Family history of prostate cancer 03/06/2017  . Pure hypercholesterolemia 09/13/2016  . Lumbar degenerative disc disease 07/22/2016  . Spinal stenosis of lumbar region 11/03/2015  . Abnormality of gait 09/15/2015  . Restrictive lung disease 05/05/2015  . 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 Hypogonadism male 02/05/2013  . Elevated PSA 11/30/2011  . OTHER DISEASES OF LUNG NOT ELSEWHERE CLASSIFIED 07/20/2010  . GOUT 04/17/2010  . SMOKER 04/17/2010  . Sleep apnea 08/22/2009  . Hypothyroidism 09/19/2007  . Dyslipidemia associated with type  2 diabetes mellitus ([place]) 09/19/2007  . Hypertension associated with diabetes ([place]) 09/19/2007  . Osteoarthritis 09/19/2007    Past Medical History:  Diagnosis Date  . ALCOHOL USE 10/24/2008  . ANXIETY 09/19/2007  . Cough 05/22/2008  . Diabetes mellitus without complication ([place])    Phreesia 06/02/2020  . DIABETES MELLITUS, TYPE II 09/19/2007  . External thrombosed hemorrhoids 08/23/2008  . GOUT 04/17/2010  . HYPERLIPIDEMIA 09/19/2007  . HYPERTENSION 09/19/2007  . Hypertension    Phreesia 06/02/2020  . HYPOTHYROIDISM 09/19/2007  . Leg pain   . OSTEOARTHRITIS 09/19/2007  . OTHER DISEASES OF LUNG NOT ELSEWHERE CLASSIFIED 07/20/2010  . Rash and other nonspecific skin eruption 09/19/2007  . Restrictive lung disease   . SCROTAL ABSCESS 05/27/2010  . Sebaceous cyst 08/01/2008  . SLEEP APNEA 08/22/2009  . Sleep apnea    Phreesia 06/02/2020  . SMOKER 04/17/2010  . Thyroid disease    Phreesia 06/02/2020    Past Surgical History:  Procedure Laterality Date  . COLONOSCOPY    . NASAL SEPTUM SURGERY      Social History   Socioeconomic History  . Marital status: Married    Spouse name: Not on file  . Number of children: 2  . Years of education: BS  . Highest education level: Not on file  Occupational History  . Occupation: Multimedia programmer: [employer]  Tobacco Use  . Smoking status: Former Smoker    Packs/day: 0.50    Years: 30.00    Pack years: 15.00    Quit date: 11/05/2012    Years since quitting: 7.5  . Smokeless tobacco: Never Used  . Tobacco comment: 30 years at most off and on smoking 03/14/15  Substance and Sexual Activity  . Alcohol use: Yes    Alcohol/week: 0.0 standard drinks    Comment: 2 drinks per day  . Drug use: Yes    Comment: Occasional Marijuana use  . Sexual activity: Not on file  Other Topics Concern  . Not on file  Social History Narrative   Originally from [place]. Has lived in [place], [place], [place], & [place] since 1993. 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 Currently has a dog & cats. Previously had a parrot (conure) 12 years ago for 1 year but in same house.  Has also had excessive exposure to pigeon feces. He has had very brief exposure to asbestos around 2000. No hot tub exposure. He has inhaled chemical fumes/gases/refridgerants through his work.      Marital status: married x 26 years      Children: 2 children (19, 6); no grandchildren      Lives: Lives at home with his wife      Employment: [employer]      Tobacco: none; quit in 2013; smoked x 1/2 ppd x 20 years      Alcohol:  10 drinks; usually 2 drinks per day and then skip two days.      Drugs:  None      Exercise: none; job is physically demanding      Right-handed.   1 cup caffeine daily.   Social Determinants of Health   Financial Resource Strain:   . Difficulty of Paying Living Expenses: Not on file  Food Insecurity:   . Worried About Charity fundraiser in the Last Year: Not on file  . Ran Out of Food in the Last Year: Not on file  Transportation Needs:   . Lack of Transportation (Medical): Not on file  . Lack of Transportation (Non-Medical): Not on file  Physical Activity:   . Days of Exercise per Week: Not on file  . Minutes of Exercise per Session: Not on file  Stress:   . Feeling of Stress : Not on file  Social Connections:   . Frequency of Communication with Friends and Family: Not on file  . Frequency of Social Gatherings with Friends and Family: Not on file  . Attends Religious Services: Not on file  . Active Member of Clubs or Organizations: Not on file  . Attends Archivist Meetings: Not on file  . Marital Status: Not on file  Intimate Partner Violence:   . Fear of Current or Ex-Partner: Not on file  . Emotionally Abused: Not on file  . Physically Abused: Not on file  . Sexually Abused: Not on file    Family History  Problem Relation Age of Onset  . Breast cancer Mother        Breast  Cancer  . Parkinson's disease Mother   . Heart disease Father        CABG, stenting cardiac  . Cancer Brother 70       prostate cancer  . Lung disease Neg Hx   . Autoimmune disease Neg Hx      Review of Systems  Constitutional: Negative.  Negative for chills and fever.  HENT: Negative.  Negative for congestion, ear discharge, ear pain, hearing loss, sore throat and tinnitus.   Eyes: Negative for blurred vision and double vision.  Respiratory: Negative.  Negative for cough and shortness of breath.   Cardiovascular: Negative.  Negative for chest pain and palpitations.  Gastrointestinal: Positive for nausea. Negative for abdominal pain, blood in stool, diarrhea, melena and vomiting.  Genitourinary: Negative.  Negative for dysuria and hematuria.  Musculoskeletal: Negative.  Negative for back pain, myalgias and neck pain.  Skin: Negative.  Negative for rash.  Neurological: Positive for dizziness. Negative for sensory change, speech change, focal weakness, seizures, loss of consciousness and headaches.  All other systems reviewed and are negative.   Vitals:   06/09/20 1635  BP: (!) 146/78  Pulse: (!) 102  Temp: 98.4 F (36.9 C)  SpO2: 91%   Wt Readings from Last 3 Encounters:  06/09/20 (!) 435 lb (197.3 kg)  06/02/20 (!) 420 lb (190.5 kg)  02/28/20 (!) 426 lb (193.2 kg)    Physical Exam Vitals reviewed.  Constitutional:      Appearance: He is obese.  HENT:     Head: Normocephalic.     Right Ear: Tympanic membrane, ear canal and external ear normal.     Left Ear: Tympanic membrane, ear canal and external ear normal.     Mouth/Throat:     Pharynx: Oropharynx is clear.  Eyes:     Extraocular Movements: Extraocular movements intact.  Conjunctiva/sclera: Conjunctivae normal.     Pupils: Pupils are equal, round, and reactive to light.  Cardiovascular:     Rate and Rhythm: Normal rate and regular rhythm.     Pulses: Normal pulses.     Heart sounds: Normal heart sounds.    Pulmonary:     Effort: Pulmonary effort is normal.     Breath sounds: Normal breath sounds.  Musculoskeletal:     Cervical back: Normal range of motion and neck supple.  Skin:    General: Skin is warm and dry.  Neurological:     General: No focal deficit present.     Mental Status: He is alert and oriented to person, place, and time.     Sensory: No sensory deficit.     Motor: No weakness.  Psychiatric:        Mood and Affect: Mood normal.        Behavior: Behavior normal.      ASSESSMENT & PLAN: Earon was seen today for dizziness.  Diagnoses and all orders for this visit:  Benign paroxysmal positional vertigo due to bilateral vestibular disorder -     meclizine (ANTIVERT) 50 MG tablet; Take 1 tablet (50 mg total) by mouth 3 (three) times daily as needed.  Morbid obesity ([place])  Type 2 diabetes mellitus with diabetic polyneuropathy, with long-term current use of insulin ([place])  Dyslipidemia associated with type 2 diabetes mellitus ([place])  Hypertension associated with diabetes [facility])    Patient Instructions       If you have lab work done today you will be contacted with your lab results within the next 2 weeks.  If you have not heard from [place] then please contact us. The fastest way to get your results is to register for My Chart.   IF you received an x-ray today, you will receive an invoice from [facility]. Please contact [facility] at [phone] with questions or concerns regarding your invoice.   IF you received labwork today, you will receive an invoice from [place]. Please contact [facility] at [phone] with questions or concerns regarding your invoice.   Our billing staff will not be able to assist you with questions regarding bills from these companies.  You will be contacted with the lab results as soon as they are available. The fastest way to get your results is to activate your My Chart account. Instructions are located on the last  page of this paperwork. If you have not heard from [place] regarding the results in 2 weeks, please contact this office.      Benign Positional Vertigo Vertigo is the feeling that you or your surroundings are moving when they are not. Benign positional vertigo is the most common form of vertigo. This is usually a harmless condition (benign). This condition is positional. This means that symptoms are triggered by certain movements and positions. This condition can be dangerous if it occurs while you are doing something that could cause harm to you or others. This includes activities such as driving or operating machinery. What are the causes? In many cases, the cause of this condition is not known. It may be caused by a disturbance in an area of the inner ear that helps your brain to sense movement and balance. This disturbance can be caused by:  Viral infection (labyrinthitis).  Head injury.  Repetitive motion, such as jumping, dancing, or running. What increases the risk? You are more likely to develop this condition if:  You are a woman.  You are 87 years of age or older. What are the signs or symptoms? Symptoms of this condition usually happen when you move your head or your eyes in different directions. Symptoms may start suddenly, and usually last for less than a minute. They include:  Loss of balance and falling.  Feeling like you are spinning or moving.  Feeling like your surroundings are spinning or moving.  Nausea and vomiting.  Blurred vision.  Dizziness.  Involuntary eye movement (nystagmus). Symptoms can be mild and cause only minor problems, or they can be severe and interfere with daily life. Episodes of benign positional vertigo may return (recur) over time. Symptoms may improve over time. How is this diagnosed? This condition may be diagnosed based on:  Your medical history.  Physical exam of the head, neck, and ears.  Tests, such as: ? MRI. ? CT  scan. ? Eye movement tests. Your health care provider may ask you to change positions quickly while he or she watches you for symptoms of benign positional vertigo, such as nystagmus. Eye movement may be tested with a variety of exams that are designed to evaluate or stimulate vertigo. ? An electroencephalogram (EEG). This records electrical activity in your brain. ? Hearing tests. You may be referred to a health care provider who specializes in ear, nose, and throat (ENT) problems (otolaryngologist) or a provider who specializes in disorders of the nervous system (neurologist). How is this treated?  This condition may be treated in a session in which your health care provider moves your head in specific positions to adjust your inner ear back to normal. Treatment for this condition may take several sessions. Surgery may be needed in severe cases, but this is rare. In some cases, benign positional vertigo may resolve on its own in 2-4 weeks. Follow these instructions at home: Safety  Move slowly. Avoid sudden body or head movements or certain positions, as told by your health care provider.  Avoid driving until your health care provider says it is safe for you to do so.  Avoid operating heavy machinery until your health care provider says it is safe for you to do so.  Avoid doing any tasks that would be dangerous to you or others if vertigo occurs.  If you have trouble walking or keeping your balance, try using a cane for stability. If you feel dizzy or unstable, sit down right away.  Return to your normal activities as told by your health care provider. Ask your health care provider what activities are safe for you. General instructions  Take over-the-counter and prescription medicines only as told by your health care provider.  Drink enough fluid to keep your urine pale yellow.  Keep all follow-up visits as told by your health care provider. This is important. Contact a health care  provider if:  You have a fever.  Your condition gets worse or you develop new symptoms.  Your family or friends notice any behavioral changes.  You have nausea or vomiting that gets worse.  You have numbness or a "pins and needles" sensation. Get help right away if you:  Have difficulty speaking or moving.  Are always dizzy.  Faint.  Develop severe headaches.  Have weakness in your legs or arms.  Have changes in your hearing or vision.  Develop a stiff neck.  Develop sensitivity to light. Summary  Vertigo is the feeling that you or your surroundings are moving when they are not. Benign positional vertigo is the most common form  of vertigo.  The cause of this condition is not known. It may be caused by a disturbance in an area of the inner ear that helps your brain to sense movement and balance.  Symptoms include loss of balance and falling, feeling that you or your surroundings are moving, nausea and vomiting, and blurred vision.  This condition can be diagnosed based on symptoms, physical exam, and other tests, such as MRI, CT scan, eye movement tests, and hearing tests.  Follow safety instructions as told by your health care provider. You will also be told when to contact your health care provider in case of problems. This information is not intended to replace advice given to you by your health care provider. Make sure you discuss any questions you have with your health care provider. Document Revised: 01/11/2018 Document Reviewed: 01/11/2018 Elsevier Patient Education  2020 Elsevier Inc.      Agustina Caroli, MD [facility]

## 2020-06-09 NOTE — Telephone Encounter (Signed)
Error

## 2020-06-11 ENCOUNTER — Telehealth: Payer: Self-pay | Admitting: Emergency Medicine

## 2020-06-11 NOTE — Telephone Encounter (Signed)
Dr Marin Comment calling form [employer] review  /regarding disability paperwork   For this patient [employer] s the carrier .  Needs quick pier review with Dr. Alvy Bimler  Dr. Marshia Ly  is needing to speak with Dr.Sagardia before 9:00 pm [place] time  Tomorrow and can be reached at [phone]

## 2020-06-16 NOTE — Telephone Encounter (Signed)
The other provider would like to speak with pts primary care regarding paperwork. Please advise

## 2020-06-26 ENCOUNTER — Encounter: Payer: Self-pay | Admitting: Family Medicine

## 2020-06-26 ENCOUNTER — Encounter: Payer: BC Managed Care – PPO | Admitting: Family Medicine

## 2020-06-26 ENCOUNTER — Other Ambulatory Visit: Payer: Self-pay

## 2020-06-26 NOTE — Patient Instructions (Signed)
° ° ° °  If you have lab work done today you will be contacted with your lab results within the next 2 weeks.  If you have not heard from us then please contact us. The fastest way to get your results is to register for My Chart. ° ° °IF you received an x-ray today, you will receive an invoice from [facility]. Please contact [facility] at [phone] with questions or concerns regarding your invoice.  ° °IF you received labwork today, you will receive an invoice from [facility]. Please contact [facility] at [phone] with questions or concerns regarding your invoice.  ° °Our billing staff will not be able to assist you with questions regarding bills from these companies. ° °You will be contacted with the lab results as soon as they are available. The fastest way to get your results is to activate your My Chart account. Instructions are located on the last page of this paperwork. If you have not heard from us regarding the results in 2 weeks, please contact this office. °  ° ° ° °

## 2020-07-01 NOTE — Progress Notes (Signed)
Error

## 2020-07-08 ENCOUNTER — Ambulatory Visit (INDEPENDENT_AMBULATORY_CARE_PROVIDER_SITE_OTHER): Payer: BC Managed Care – PPO

## 2020-07-08 ENCOUNTER — Other Ambulatory Visit: Payer: Self-pay

## 2020-07-08 ENCOUNTER — Telehealth: Payer: Self-pay | Admitting: Emergency Medicine

## 2020-07-08 ENCOUNTER — Ambulatory Visit: Payer: BC Managed Care – PPO | Admitting: Emergency Medicine

## 2020-07-08 ENCOUNTER — Encounter: Payer: Self-pay | Admitting: Emergency Medicine

## 2020-07-08 VITALS — BP 160/82 | HR 85 | Temp 98.7°F | Resp 16 | Ht 75.0 in | Wt >= 6400 oz

## 2020-07-08 DIAGNOSIS — E785 Hyperlipidemia, unspecified: Secondary | ICD-10-CM

## 2020-07-08 DIAGNOSIS — I739 Peripheral vascular disease, unspecified: Secondary | ICD-10-CM

## 2020-07-08 DIAGNOSIS — E1159 Type 2 diabetes mellitus with other circulatory complications: Secondary | ICD-10-CM

## 2020-07-08 DIAGNOSIS — J22 Unspecified acute lower respiratory infection: Secondary | ICD-10-CM | POA: Diagnosis not present

## 2020-07-08 DIAGNOSIS — J189 Pneumonia, unspecified organism: Secondary | ICD-10-CM

## 2020-07-08 DIAGNOSIS — M5136 Other intervertebral disc degeneration, lumbar region: Secondary | ICD-10-CM

## 2020-07-08 DIAGNOSIS — E1169 Type 2 diabetes mellitus with other specified complication: Secondary | ICD-10-CM

## 2020-07-08 DIAGNOSIS — M159 Polyosteoarthritis, unspecified: Secondary | ICD-10-CM

## 2020-07-08 DIAGNOSIS — Z794 Long term (current) use of insulin: Secondary | ICD-10-CM

## 2020-07-08 DIAGNOSIS — M170 Bilateral primary osteoarthritis of knee: Secondary | ICD-10-CM

## 2020-07-08 DIAGNOSIS — I152 Hypertension secondary to endocrine disorders: Secondary | ICD-10-CM

## 2020-07-08 DIAGNOSIS — R059 Cough, unspecified: Secondary | ICD-10-CM

## 2020-07-08 DIAGNOSIS — E1142 Type 2 diabetes mellitus with diabetic polyneuropathy: Secondary | ICD-10-CM

## 2020-07-08 DIAGNOSIS — G4733 Obstructive sleep apnea (adult) (pediatric): Secondary | ICD-10-CM

## 2020-07-08 DIAGNOSIS — M48061 Spinal stenosis, lumbar region without neurogenic claudication: Secondary | ICD-10-CM

## 2020-07-08 DIAGNOSIS — M8949 Other hypertrophic osteoarthropathy, multiple sites: Secondary | ICD-10-CM

## 2020-07-08 LAB — POCT CBC
Granulocyte percent: 64.8 %G (ref 37–80)
HCT, POC: 41.6 % — AB (ref 29–41)
Hemoglobin: 13.6 g/dL (ref 11–14.6)
Lymph, poc: 1.8 (ref 0.6–3.4)
MCH, POC: 29.4 pg (ref 27–31.2)
MCHC: 32.8 g/dL (ref 31.8–35.4)
MCV: 89.6 fL (ref 76–111)
MID (cbc): 0.6 (ref 0–0.9)
MPV: 6.7 fL (ref 0–99.8)
POC Granulocyte: 4.3 (ref 2–6.9)
POC LYMPH PERCENT: 26.6 %L (ref 10–50)
POC MID %: 8.6 %M (ref 0–12)
Platelet Count, POC: 254 10*3/uL (ref 142–424)
RBC: 4.64 M/uL — AB (ref 4.69–6.13)
RDW, POC: 14.2 %
WBC: 6.6 10*3/uL (ref 4.6–10.2)

## 2020-07-08 LAB — POCT GLYCOSYLATED HEMOGLOBIN (HGB A1C): Hemoglobin A1C: 7.4 % — AB (ref 4.0–5.6)

## 2020-07-08 LAB — GLUCOSE, POCT (MANUAL RESULT ENTRY): POC Glucose: 204 mg/dl — AB (ref 70–99)

## 2020-07-08 MED ORDER — AZITHROMYCIN 250 MG PO TABS: ORAL_TABLET | ORAL | 0 refills | Status: DC

## 2020-07-08 MED ORDER — LOSARTAN POTASSIUM-HCTZ 100-12.5 MG PO TABS: 1.0000 | ORAL_TABLET | Freq: Every day | ORAL | 3 refills | Status: DC

## 2020-07-08 MED ORDER — CEFTRIAXONE SODIUM 500 MG IJ SOLR
500.0000 mg | Freq: Once | INTRAMUSCULAR | Status: AC
  Administered 2020-07-08: 500 mg via INTRAMUSCULAR

## 2020-07-08 MED ORDER — PREDNISONE 20 MG PO TABS: 40.0000 mg | ORAL_TABLET | Freq: Every day | ORAL | 0 refills | Status: AC

## 2020-07-08 MED ORDER — DM-GUAIFENESIN ER 30-600 MG PO TB12: 2.0000 | ORAL_TABLET | Freq: Two times a day (BID) | ORAL | 1 refills | Status: AC

## 2020-07-08 MED ORDER — CEFTRIAXONE SODIUM 1 G IJ SOLR: 1.0000 g | Freq: Once | INTRAMUSCULAR | Status: DC

## 2020-07-08 NOTE — Telephone Encounter (Signed)
Pt received medication

## 2020-07-08 NOTE — Patient Instructions (Addendum)
If you have lab work done today you will be contacted with your lab results within the next 2 weeks.  If you have not heard from [place] then please contact us. The fastest way to get your results is to register for My Chart.   IF you received an x-ray today, you will receive an invoice from [facility]. Please contact [facility] at [phone] with questions or concerns regarding your invoice.   IF you received labwork today, you will receive an invoice from [facility]. Please contact [facility] at [phone] with questions or concerns regarding your invoice.   Our billing staff will not be able to assist you with questions regarding bills from these companies.  You will be contacted with the lab results as soon as they are available. The fastest way to get your results is to activate your My Chart account. Instructions are located on the last page of this paperwork. If you have not heard from [place] regarding the results in 2 weeks, please contact this office.     Community-Acquired Pneumonia, Adult Pneumonia is an infection of the lungs. It causes swelling in the airways of the lungs. Mucus and fluid may also build up inside the airways. One type of pneumonia can happen while a person is in a hospital. A different type can happen when a person is not in a hospital (community-acquired pneumonia).  What are the causes?  This condition is caused by germs (viruses, bacteria, or fungi). Some types of germs can be passed from one person to another. This can happen when you breathe in droplets from the cough or sneeze of an infected person. What increases the risk? You are more likely to develop this condition if you:  Have a long-term (chronic) disease, such as: ? Chronic obstructive pulmonary disease (COPD). ? Asthma. ? Cystic fibrosis. ? Congestive heart failure. ? Diabetes. ? Kidney disease.  Have HIV.  Have sickle cell disease.  Have had your spleen removed.  Do  not take good care of your teeth and mouth (poor dental hygiene).  Have a medical condition that increases the risk of breathing in droplets from your own mouth and nose.  Have a weakened body defense system (immune system).  Are a smoker.  Travel to areas where the germs that cause this illness are common.  Are around certain animals or the places they live. What are the signs or symptoms?  A dry cough.  A wet (productive) cough.  Fever.  Sweating.  Chest pain. This often happens when breathing deeply or coughing.  Fast breathing or trouble breathing.  Shortness of breath.  Shaking chills.  Feeling tired (fatigue).  Muscle aches. How is this treated? Treatment for this condition depends on many things. Most adults can be treated at home. In some cases, treatment must happen in a hospital. Treatment may include:  Medicines given by mouth or through an IV tube.  Being given extra oxygen.  Respiratory therapy. In rare cases, treatment for very bad pneumonia may include:  Using a machine to help you breathe.  Having a procedure to remove fluid from around your lungs. Follow these instructions at home: Medicines  Take over-the-counter and prescription medicines only as told by your doctor. ? Only take cough medicine if you are losing sleep.  If you were prescribed an antibiotic medicine, take it as told by your doctor. Do not stop taking the antibiotic even if you start to feel better. General instructions   Sleep with  your head and neck raised (elevated). You can do this by sleeping in a recliner or by putting a few pillows under your head.  Rest as needed. Get at least 8 hours of sleep each night.  Drink enough water to keep your pee (urine) pale yellow.  Eat a healthy diet that includes plenty of vegetables, fruits, whole grains, low-fat dairy products, and lean protein.  Do not use any products that contain nicotine or tobacco. These include cigarettes,  e-cigarettes, and chewing tobacco. If you need help quitting, ask your doctor.  Keep all follow-up visits as told by your doctor. This is important. How is this prevented? A shot (vaccine) can help prevent pneumonia. Shots are often suggested for:  People older than 59 years of age.  People older than 59 years of age who: ? Are having cancer treatment. ? Have long-term (chronic) lung disease. ? Have problems with their body's defense system. You may also prevent pneumonia if you take these actions:  Get the flu (influenza) shot every year.  Go to the dentist as often as told.  Wash your hands often. If you cannot use soap and water, use hand sanitizer. Contact a doctor if:  You have a fever.  You lose sleep because your cough medicine does not help. Get help right away if:  You are short of breath and it gets worse.  You have more chest pain.  Your sickness gets worse. This is very serious if: ? You are an older adult. ? Your body's defense system is weak.  You cough up blood. Summary  Pneumonia is an infection of the lungs.  Most adults can be treated at home. Some will need treatment in a hospital.  Drink enough water to keep your pee pale yellow.  Get at least 8 hours of sleep each night. This information is not intended to replace advice given to you by your health care provider. Make sure you discuss any questions you have with your health care provider. Document Revised: 11/22/2018 Document Reviewed: 03/30/2018 Elsevier Patient Education  2020 [employer].

## 2020-07-08 NOTE — Telephone Encounter (Signed)
Patient scheduled one week follow up as per provider. Patient requested a call to give him the appointment date. Scheduled for 07/15/2020 at 2:20pm. Please advise at [phone]

## 2020-07-08 NOTE — Progress Notes (Signed)
Johnny Andrews 59 y.o.   Chief Complaint  Patient presents with  . Cough    per patient really bad for a week  . Dizziness    per patient continous   . Fatigue    per patient about a week    HISTORY OF PRESENT ILLNESS: This is a 59 y.o. male seen by me on 06/09/2020, here for follow-up today. Still having some flulike symptoms.  Persistent cough at times productive. Intermittent dizziness, responsive to meclizine. Some nasal congestion and discharge. Intermittent fatigue. No other new symptomatology. Has multiple chronic medical problems including diabetes, hypertension, dyslipidemia, sleep apnea. States he does not clean his sleep apnea machine very often. Has history of allergies to dust mites. Also has history of lumbar spinal stenosis and diffuse osteoarthritis and diabetic polyneuropathy.  Limited ambulation.  On permanent disability. Fully vaccinated against Covid.  HPI   Prior to Admission medications   Medication Sig Start Date End Date Taking? Authorizing Provider  ALPRAZolam Duanne Moron) 0.5 MG tablet TAKE 1 TABLET BY MOUTH EVERY DAY AS NEEDED FOR ANXIETY 02/27/20  Yes Arisbeth Purrington, Ines Bloomer, MD  diclofenac (VOLTAREN) 75 MG EC tablet TAKE 1 TABLET BY MOUTH TWICE A DAY 04/13/20  Yes Dhwani Venkatesh, Ines Bloomer, MD  insulin aspart (NOVOLOG FLEXPEN) 100 UNIT/ML FlexPen Inject 75 Units into the skin 2 (two) times daily before a meal. 30 units am and 50 units pm   Yes [provider]  lisinopril-hydrochlorothiazide (PRINZIDE,ZESTORETIC) 20-12.5 MG tablet Take 1 tablet by mouth daily.   Yes [provider]  metFORMIN (GLUCOPHAGE-XR) 500 MG 24 hr tablet Take 1,000 mg by mouth 2 (two) times daily.  09/15/15  Yes [provider]  Respiratory Therapy Supplies (ADULT MASK) MISC 2 Devices by Does not apply route once. 11/24/11  Yes Rigoberto Noel, MD  simvastatin (ZOCOR) 20 MG tablet Take 20 mg by mouth daily.   Yes [provider]  topiramate (TOPAMAX) 50  MG tablet TAKE 2 TABLETS (100 MG TOTAL) BY MOUTH AT BEDTIME. 04/01/20  Yes Mennie Spiller, Ines Bloomer, MD  traZODone (DESYREL) 100 MG tablet TAKE 1 TABLET BY MOUTH EVERYDAY AT BEDTIME 04/24/20  Yes Chontel Warning, [place], MD  TRESIBA FLEXTOUCH 200 UNIT/ML SOPN INJECT 100 UNITS TWICE A DAY 12/17/16  Yes [provider]  blood glucose meter kit and supplies 1 each by Other route 2 (two) times daily. 03/14/20   Horald Pollen, MD  diltiazem [facility]) 180 MG 24 hr capsule Take 180 mg by mouth daily. Patient not taking: Reported on 07/08/2020 02/10/17   [provider]  empagliflozin (JARDIANCE) 25 MG TABS tablet Take 25 mg by mouth daily.  Patient not taking: Reported on 07/08/2020    [provider]  glucose blood (ONE TOUCH ULTRA TEST) test strip Two times a day for ULTRA 2 ONE TOUCH 02/28/20   Horald Pollen, MD  Insulin Pen Needle (BD ULTRA-FINE PEN NEEDLES) 29G X 12.7MM MISC USE TWO CHECK BLOOD SUGAR TWICE A DAY. APPOINTMENT NEEDED FOR FURTHER REFILLS 02/28/20   Horald Pollen, MD  levothyroxine (SYNTHROID) 137 MCG tablet Take 2 tablets (274 mcg total) by mouth daily before breakfast. 03/04/20 06/09/20  Horald Pollen, MD  meclizine (ANTIVERT) 50 MG tablet Take 1 tablet (50 mg total) by mouth 3 (three) times daily as needed. Patient not taking: Reported on 07/08/2020 06/09/20   Horald Pollen, MD    Allergies  Allergen Reactions  . Other     Patient Active Problem List  Diagnosis Date Noted  . Chronic venous insufficiency 07/25/2019  . Skin lesion of scalp 07/25/2019  . Carpal tunnel syndrome of right wrist 04/24/2018  . Diabetic peripheral neuropathy ([place]) 04/24/2018  . Morbid obesity ([place]) 01/27/2018  . Osteoarthritis of left knee 12/16/2017  . Osteoarthritis of right knee 12/16/2017  . Family history of prostate cancer 03/06/2017  . Pure hypercholesterolemia 09/13/2016  . Lumbar degenerative disc disease 07/22/2016  . Spinal stenosis  of lumbar region 11/03/2015  . Abnormality of gait 09/15/2015  . Restrictive lung disease 05/05/2015  . 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 Hypogonadism male 02/05/2013  . Elevated PSA 11/30/2011  . OTHER DISEASES OF LUNG NOT ELSEWHERE CLASSIFIED 07/20/2010  . GOUT 04/17/2010  . SMOKER 04/17/2010  . Sleep apnea 08/22/2009  . Hypothyroidism 09/19/2007  . Dyslipidemia associated with type 2 diabetes mellitus ([place]) 09/19/2007  . Hypertension associated with diabetes ([place]) 09/19/2007  . Osteoarthritis 09/19/2007    Past Medical History:  Diagnosis Date  . ALCOHOL USE 10/24/2008  . ANXIETY 09/19/2007  . Cough 05/22/2008  . Diabetes mellitus without complication ([place])    Phreesia 06/02/2020  . DIABETES MELLITUS, TYPE II 09/19/2007  . External thrombosed hemorrhoids 08/23/2008  . GOUT 04/17/2010  . HYPERLIPIDEMIA 09/19/2007  . HYPERTENSION 09/19/2007  . Hypertension    Phreesia 06/02/2020  . HYPOTHYROIDISM 09/19/2007  . Leg pain   . OSTEOARTHRITIS 09/19/2007  . OTHER DISEASES OF LUNG NOT ELSEWHERE CLASSIFIED 07/20/2010  . Rash and other nonspecific skin eruption 09/19/2007  . Restrictive lung disease   . SCROTAL ABSCESS 05/27/2010  . Sebaceous cyst 08/01/2008  . SLEEP APNEA 08/22/2009  . Sleep apnea    Phreesia 06/02/2020  . SMOKER 04/17/2010  . Thyroid disease    Phreesia 06/02/2020    Past Surgical History:  Procedure Laterality Date  . COLONOSCOPY    . NASAL SEPTUM SURGERY      Social History   Socioeconomic History  . Marital status: Married    Spouse name: Not on file  . Number of children: 2  . Years of education: BS  . Highest education level: Not on file  Occupational History  . Occupation: Multimedia programmer: [employer]  Tobacco Use  . Smoking status: Former Smoker    Packs/day: 0.50    Years: 30.00    Pack years: 15.00    Quit date: 11/05/2012    Years since quitting: 7.6  . Smokeless tobacco: Never Used  . Tobacco comment: 30 years at most off and on smoking  03/14/15  Substance and Sexual Activity  . Alcohol use: Yes    Alcohol/week: 0.0 standard drinks    Comment: 2 drinks per day  . Drug use: Yes    Comment: Occasional Marijuana use  . Sexual activity: Not on file  Other Topics Concern  . Not on file  Social History Narrative   Originally from [place]. Has lived in [place], [place], [place], & [place] since 1993. 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 Currently has a dog & cats. Previously had a parrot (conure) 12 years ago for 1 year but in same house.  Has also had excessive exposure to pigeon feces. He has had very brief exposure to asbestos around 2000. No hot tub exposure. He has inhaled chemical fumes/gases/refridgerants through his work.      Marital status: married x 26 years      Children: 2 children (  19, 22); no grandchildren      Lives: Lives at home with his wife      Employment: [employer]      Tobacco: none; quit in 2013; smoked x 1/2 ppd x 20 years      Alcohol:  10 drinks; usually 2 drinks per day and then skip two days.      Drugs:  None      Exercise: none; job is physically demanding      Right-handed.   1 cup caffeine daily.   Social Determinants of Health   Financial Resource Strain:   . Difficulty of Paying Living Expenses: Not on file  Food Insecurity:   . Worried About Charity fundraiser in the Last Year: Not on file  . Ran Out of Food in the Last Year: Not on file  Transportation Needs:   . Lack of Transportation (Medical): Not on file  . Lack of Transportation (Non-Medical): Not on file  Physical Activity:   . Days of Exercise per Week: Not on file  . Minutes of Exercise per Session: Not on file  Stress:   . Feeling of Stress : Not on file  Social Connections:   . Frequency of Communication with Friends and Family: Not on file  . Frequency of Social Gatherings with Friends and Family: Not on file  . Attends Religious Services: Not on file  . Active  Member of Clubs or Organizations: Not on file  . Attends Archivist Meetings: Not on file  . Marital Status: Not on file  Intimate Partner Violence:   . Fear of Current or Ex-Partner: Not on file  . Emotionally Abused: Not on file  . Physically Abused: Not on file  . Sexually Abused: Not on file    Family History  Problem Relation Age of Onset  . Breast cancer Mother        Breast Cancer  . Parkinson's disease Mother   . Heart disease Father        CABG, stenting cardiac  . Cancer Brother 40       prostate cancer  . Lung disease Neg Hx   . Autoimmune disease Neg Hx      Review of Systems  Constitutional: Positive for malaise/fatigue. Negative for chills and fever.  HENT: Positive for congestion.   Respiratory: Positive for cough and sputum production. Negative for shortness of breath and wheezing.   Cardiovascular: Negative.  Negative for chest pain and palpitations.  Gastrointestinal: Negative.  Negative for abdominal pain, blood in stool, diarrhea, melena, nausea and vomiting.  Genitourinary: Negative.  Negative for dysuria and hematuria.  Musculoskeletal: Positive for back pain and joint pain.  Skin: Negative.  Negative for rash.  Neurological: Negative.  Negative for dizziness and headaches.  All other systems reviewed and are negative.   Today's Vitals   07/08/20 1101  BP: (!) 160/82  Pulse: 85  Resp: 16  Temp: 98.7 F (37.1 C)  TempSrc: Temporal  SpO2: 93%  Weight: (!) 425 lb (192.8 kg)  Height: $Remove'6\' 3"'YdgiuYg$  (1.905 m)   Body mass index is 53.12 kg/m.  Physical Exam Vitals reviewed.  Constitutional:      Appearance: He is obese.  HENT:     Head: Normocephalic.     Right Ear: Tympanic membrane, ear canal and external ear normal.     Left Ear: Tympanic membrane, ear canal and external ear normal.     Nose: Nose normal.  Mouth/Throat:     Mouth: Mucous membranes are moist.     Pharynx: Oropharynx is clear.  Eyes:     Extraocular Movements:  Extraocular movements intact.     Conjunctiva/sclera: Conjunctivae normal.     Pupils: Pupils are equal, round, and reactive to light.  Cardiovascular:     Rate and Rhythm: Normal rate and regular rhythm.     Pulses: Normal pulses.     Heart sounds: Normal heart sounds.  Pulmonary:     Effort: Pulmonary effort is normal.     Breath sounds: Normal breath sounds.  Musculoskeletal:     Cervical back: Normal range of motion and neck supple. No tenderness.  Skin:    General: Skin is warm and dry.  Neurological:     General: No focal deficit present.     Mental Status: He is alert and oriented to person, place, and time.  Psychiatric:        Mood and Affect: Mood normal.        Behavior: Behavior normal.    Results for orders placed or performed in visit on 07/08/20 (from the past 24 hour(s))  POCT glucose (manual entry)     Status: Abnormal   Collection Time: 07/08/20 11:37 AM  Result Value Ref Range   POC Glucose 204 (A) 70 - 99 mg/dl  POCT glycosylated hemoglobin (Hb A1C)     Status: Abnormal   Collection Time: 07/08/20 11:39 AM  Result Value Ref Range   Hemoglobin A1C 7.4 (A) 4.0 - 5.6 %   HbA1c POC (<> result, manual entry)     HbA1c, POC (prediabetic range)     HbA1c, POC (controlled diabetic range)    POCT CBC     Status: Abnormal   Collection Time: 07/08/20 11:41 AM  Result Value Ref Range   WBC 6.6 4.6 - 10.2 K/uL   Lymph, poc 1.8 0.6 - 3.4   POC LYMPH PERCENT 26.6 10 - 50 %L   MID (cbc) 0.6 0 - 0.9   POC MID % 8.6 0 - 12 %M   POC Granulocyte 4.3 2 - 6.9   Granulocyte percent 64.8 37 - 80 %G   RBC 4.64 (A) 4.69 - 6.13 M/uL   Hemoglobin 13.6 11 - 14.6 g/dL   HCT, POC 41.6 (A) 29 - 41 %   MCV 89.6 76 - 111 fL   MCH, POC 29.4 27 - 31.2 pg   MCHC 32.8 31.8 - 35.4 g/dL   RDW, POC 14.2 %   Platelet Count, POC 254 142 - 424 K/uL   MPV 6.7 0 - 99.8 fL   DG Chest 2 View  Result Date: 07/08/2020 CLINICAL DATA:  Cough. EXAM: CHEST - 2 VIEW COMPARISON:  Dec 30, 2017.  FINDINGS: The heart size and mediastinal contours are within normal limits. Right lower lung consolidation. No visible pleural effusions or pneumothorax. Azygos fissure, anatomic variant. IMPRESSION: Right lower lung consolidation, compatible with pneumonia. Recommend follow-up to resolution. Electronically Signed   By: Margaretha Sheffield MD   On: 07/08/2020 11:58   A total of 45 minutes was spent with the patient, greater than 50% of which was in counseling/coordination of care regarding diagnosis of pneumonia and treatment with antibiotics, prognosis, ED precautions, review of most recent office visit notes, review of chest x-ray and blood work results including today's CBC, review of chronic medical problems, documentation and need for follow-up next week.   ASSESSMENT & PLAN: Exavior was seen today for cough, dizziness  and fatigue.  Diagnoses and all orders for this visit:  Pneumonia of right lower lobe due to infectious organism -     azithromycin (ZITHROMAX) 250 MG tablet; Sig as indicated -     predniSONE (DELTASONE) 20 MG tablet; Take 2 tablets (40 mg total) by mouth daily with breakfast for 5 days. -     cefTRIAXone (ROCEPHIN) injection 1 g  Morbid obesity (HCC)  Type 2 diabetes mellitus with diabetic polyneuropathy, with long-term current use of insulin (HCC) -     POCT glucose (manual entry) -     POCT glycosylated hemoglobin (Hb A1C)  Dyslipidemia associated with type 2 diabetes mellitus (HCC) -     Lipid panel  Hypertension associated with diabetes ([place]) -     Comprehensive metabolic panel -     losartan-hydrochlorothiazide (HYZAAR) 100-12.5 MG tablet; Take 1 tablet by mouth daily.  Obstructive sleep apnea syndrome  Diabetic peripheral neuropathy (HCC)  Lumbar degenerative disc disease  Primary osteoarthritis involving multiple joints  Primary osteoarthritis of both knees  Spinal stenosis of lumbar region, unspecified whether neurogenic claudication  present  Cough -     DG Chest 2 View -     SAR CoV2 Serology (COVID 19)AB(IGG)IA  Lower respiratory infection -     POCT CBC  Claudication [facility])    Patient Instructions       If you have lab work done today you will be contacted with your lab results within the next 2 weeks.  If you have not heard from [place] then please contact us. The fastest way to get your results is to register for My Chart.   IF you received an x-ray today, you will receive an invoice from [facility]. Please contact [facility] at [phone] with questions or concerns regarding your invoice.   IF you received labwork today, you will receive an invoice from [facility]. Please contact [facility] at [phone] with questions or concerns regarding your invoice.   Our billing staff will not be able to assist you with questions regarding bills from these companies.  You will be contacted with the lab results as soon as they are available. The fastest way to get your results is to activate your My Chart account. Instructions are located on the last page of this paperwork. If you have not heard from [place] regarding the results in 2 weeks, please contact this office.     Community-Acquired Pneumonia, Adult Pneumonia is an infection of the lungs. It causes swelling in the airways of the lungs. Mucus and fluid may also build up inside the airways. One type of pneumonia can happen while a person is in a hospital. A different type can happen when a person is not in a hospital (community-acquired pneumonia).  What are the causes?  This condition is caused by germs (viruses, bacteria, or fungi). Some types of germs can be passed from one person to another. This can happen when you breathe in droplets from the cough or sneeze of an infected person. What increases the risk? You are more likely to develop this condition if you:  Have a long-term (chronic) disease, such as: ? Chronic obstructive pulmonary  disease (COPD). ? Asthma. ? Cystic fibrosis. ? Congestive heart failure. ? Diabetes. ? Kidney disease.  Have HIV.  Have sickle cell disease.  Have had your spleen removed.  Do not take good care of your teeth and mouth (poor dental hygiene).  Have a medical condition that increases the risk of breathing in  droplets from your own mouth and nose.  Have a weakened body defense system (immune system).  Are a smoker.  Travel to areas where the germs that cause this illness are common.  Are around certain animals or the places they live. What are the signs or symptoms?  A dry cough.  A wet (productive) cough.  Fever.  Sweating.  Chest pain. This often happens when breathing deeply or coughing.  Fast breathing or trouble breathing.  Shortness of breath.  Shaking chills.  Feeling tired (fatigue).  Muscle aches. How is this treated? Treatment for this condition depends on many things. Most adults can be treated at home. In some cases, treatment must happen in a hospital. Treatment may include:  Medicines given by mouth or through an IV tube.  Being given extra oxygen.  Respiratory therapy. In rare cases, treatment for very bad pneumonia may include:  Using a machine to help you breathe.  Having a procedure to remove fluid from around your lungs. Follow these instructions at home: Medicines  Take over-the-counter and prescription medicines only as told by your doctor. ? Only take cough medicine if you are losing sleep.  If you were prescribed an antibiotic medicine, take it as told by your doctor. Do not stop taking the antibiotic even if you start to feel better. General instructions   Sleep with your head and neck raised (elevated). You can do this by sleeping in a recliner or by putting a few pillows under your head.  Rest as needed. Get at least 8 hours of sleep each night.  Drink enough water to keep your pee (urine) pale yellow.  Eat a healthy  diet that includes plenty of vegetables, fruits, whole grains, low-fat dairy products, and lean protein.  Do not use any products that contain nicotine or tobacco. These include cigarettes, e-cigarettes, and chewing tobacco. If you need help quitting, ask your doctor.  Keep all follow-up visits as told by your doctor. This is important. How is this prevented? A shot (vaccine) can help prevent pneumonia. Shots are often suggested for:  People older than 59 years of age.  People older than 59 years of age who: ? Are having cancer treatment. ? Have long-term (chronic) lung disease. ? Have problems with their body's defense system. You may also prevent pneumonia if you take these actions:  Get the flu (influenza) shot every year.  Go to the dentist as often as told.  Wash your hands often. If you cannot use soap and water, use hand sanitizer. Contact a doctor if:  You have a fever.  You lose sleep because your cough medicine does not help. Get help right away if:  You are short of breath and it gets worse.  You have more chest pain.  Your sickness gets worse. This is very serious if: ? You are an older adult. ? Your body's defense system is weak.  You cough up blood. Summary  Pneumonia is an infection of the lungs.  Most adults can be treated at home. Some will need treatment in a hospital.  Drink enough water to keep your pee pale yellow.  Get at least 8 hours of sleep each night. This information is not intended to replace advice given to you by your health care provider. Make sure you discuss any questions you have with your health care provider. Document Revised: 11/22/2018 Document Reviewed: 03/30/2018 Elsevier Patient Education  2020 Elsevier Inc.       Agustina Caroli, MD [facility]

## 2020-07-08 NOTE — Telephone Encounter (Signed)
Pt was seen on 07/08/20. He is under the impression he was suppose to get a liquid medication for his cough.   Pharmacy  [facility] [address] [place] - [address]  [address] [place] [address]  Phone:  [phone] Fax:  [phone]  DEA #:  [id number]   Please advise at [phone].

## 2020-07-09 ENCOUNTER — Ambulatory Visit: Payer: BC Managed Care – PPO | Admitting: Emergency Medicine

## 2020-07-09 LAB — COMPREHENSIVE METABOLIC PANEL
ALT: 20 IU/L (ref 0–44)
AST: 16 IU/L (ref 0–40)
Albumin/Globulin Ratio: 1.4 (ref 1.2–2.2)
Albumin: 4 g/dL (ref 3.8–4.9)
Alkaline Phosphatase: 70 IU/L (ref 44–121)
BUN/Creatinine Ratio: 24 — ABNORMAL HIGH (ref 9–20)
BUN: 24 mg/dL (ref 6–24)
Bilirubin Total: 0.2 mg/dL (ref 0.0–1.2)
CO2: 23 mmol/L (ref 20–29)
Calcium: 9.2 mg/dL (ref 8.7–10.2)
Chloride: 101 mmol/L (ref 96–106)
Creatinine, Ser: 0.98 mg/dL (ref 0.76–1.27)
GFR calc Af Amer: 97 mL/min/{1.73_m2} (ref >59)
GFR calc non Af Amer: 84 mL/min/{1.73_m2} (ref >59)
Globulin, Total: 2.8 g/dL (ref 1.5–4.5)
Glucose: 207 mg/dL — ABNORMAL HIGH (ref 65–99)
Potassium: 4.7 mmol/L (ref 3.5–5.2)
Sodium: 140 mmol/L (ref 134–144)
Total Protein: 6.8 g/dL (ref 6.0–8.5)

## 2020-07-09 LAB — LIPID PANEL
Chol/HDL Ratio: 4.6 ratio (ref 0.0–5.0)
Cholesterol, Total: 165 mg/dL (ref 100–199)
HDL: 36 mg/dL — ABNORMAL LOW (ref >39)
LDL Chol Calc (NIH): 83 mg/dL (ref 0–99)
Triglycerides: 278 mg/dL — ABNORMAL HIGH (ref 0–149)
VLDL Cholesterol Cal: 46 mg/dL — ABNORMAL HIGH (ref 5–40)

## 2020-07-09 LAB — SAR COV2 SEROLOGY (COVID19)AB(IGG),IA
SARS-CoV-2 Semi-Quant IgG Ab: 30.8 AU/mL (ref <13.0)
SARS-CoV-2 Spike Ab Interp: POSITIVE

## 2020-07-15 ENCOUNTER — Ambulatory Visit (INDEPENDENT_AMBULATORY_CARE_PROVIDER_SITE_OTHER): Payer: BC Managed Care – PPO

## 2020-07-15 ENCOUNTER — Ambulatory Visit: Payer: BC Managed Care – PPO | Admitting: Emergency Medicine

## 2020-07-15 ENCOUNTER — Encounter: Payer: Self-pay | Admitting: Emergency Medicine

## 2020-07-15 ENCOUNTER — Other Ambulatory Visit: Payer: Self-pay

## 2020-07-15 VITALS — BP 144/83 | HR 94 | Temp 98.5°F | Ht 75.0 in | Wt >= 6400 oz

## 2020-07-15 DIAGNOSIS — J189 Pneumonia, unspecified organism: Secondary | ICD-10-CM

## 2020-07-15 MED ORDER — DOXYCYCLINE HYCLATE 100 MG PO TABS: 100.0000 mg | ORAL_TABLET | Freq: Two times a day (BID) | ORAL | 0 refills | Status: AC

## 2020-07-15 MED ORDER — ALBUTEROL SULFATE HFA 108 (90 BASE) MCG/ACT IN AERS: 2.0000 | INHALATION_SPRAY | Freq: Two times a day (BID) | RESPIRATORY_TRACT | 0 refills | Status: DC

## 2020-07-15 NOTE — Progress Notes (Signed)
Johnny Andrews 59 y.o.   Chief Complaint  Patient presents with   Follow-up    post pnuemonia, feels a little better still having some gurgling in the lungs. Asking for rf on topomax. Also wants to discuss should he get the booster and flu vaccine    HISTORY OF PRESENT ILLNESS: This is a 59 y.o. male here for follow-up of pneumonia. Finished Zithromax and started doxycycline 100 mg once a day for 3 days. Finished prednisone. Feels about 75% better.  HPI   Prior to Admission medications   Medication Sig Start Date End Date Taking? Authorizing Provider  ALPRAZolam Duanne Moron) 0.5 MG tablet TAKE 1 TABLET BY MOUTH EVERY DAY AS NEEDED FOR ANXIETY 02/27/20  Yes Rhina Kramme, Ines Bloomer, MD  azithromycin [facility]) 250 MG tablet Sig as indicated 07/08/20  Yes Ginevra Tacker, Ines Bloomer, MD  blood glucose meter kit and supplies 1 each by Other route 2 (two) times daily. 03/14/20  Yes Imari Reen, Ines Bloomer, MD  dextromethorphan-guaiFENesin [facility]) 30-600 MG 12hr tablet Take 2 tablets by mouth 2 (two) times daily for 7 days. 07/08/20 07/15/20 Yes Nate Common, Ines Bloomer, MD  diclofenac (VOLTAREN) 75 MG EC tablet TAKE 1 TABLET BY MOUTH TWICE A DAY 04/13/20  Yes Latia Mataya, Ines Bloomer, MD  empagliflozin (JARDIANCE) 25 MG TABS tablet Take 25 mg by mouth daily.    Yes [provider]  glucose blood (ONE TOUCH ULTRA TEST) test strip Two times a day for ULTRA 2 ONE TOUCH 02/28/20  Yes Amita Atayde, Ines Bloomer, MD  insulin aspart (NOVOLOG FLEXPEN) 100 UNIT/ML FlexPen Inject 75 Units into the skin 2 (two) times daily before a meal. 30 units am and 50 units pm   Yes [provider]  Insulin Pen Needle (BD ULTRA-FINE PEN NEEDLES) 29G X 12.7MM MISC USE TWO CHECK BLOOD SUGAR TWICE A DAY. APPOINTMENT NEEDED FOR FURTHER REFILLS 02/28/20  Yes Lory Nowaczyk, Ines Bloomer, MD  losartan-hydrochlorothiazide [facility]) 100-12.5 MG tablet Take 1 tablet by mouth daily. 07/08/20  Yes Senia Even, Ines Bloomer, MD  meclizine  (ANTIVERT) 50 MG tablet Take 1 tablet (50 mg total) by mouth 3 (three) times daily as needed. 06/09/20  Yes Maxi Carreras, Ines Bloomer, MD  metFORMIN (GLUCOPHAGE-XR) 500 MG 24 hr tablet Take 1,000 mg by mouth 2 (two) times daily.  09/15/15  Yes [provider]  Respiratory Therapy Supplies (ADULT MASK) MISC 2 Devices by Does not apply route once. 11/24/11  Yes Rigoberto Noel, MD  simvastatin (ZOCOR) 20 MG tablet Take 20 mg by mouth daily.   Yes [provider]  topiramate (TOPAMAX) 50 MG tablet TAKE 2 TABLETS (100 MG TOTAL) BY MOUTH AT BEDTIME. 04/01/20  Yes [place], Ines Bloomer, MD  traZODone (DESYREL) 100 MG tablet TAKE 1 TABLET BY MOUTH EVERYDAY AT BEDTIME 04/24/20  Yes Janaya Broy, Ines Bloomer, MD  TRESIBA FLEXTOUCH 200 UNIT/ML SOPN INJECT 100 UNITS TWICE A DAY 12/17/16  Yes [provider]  levothyroxine (SYNTHROID) 137 MCG tablet Take 2 tablets (274 mcg total) by mouth daily before breakfast. 03/04/20 06/09/20  Horald Pollen, MD    Allergies  Allergen Reactions   Other     Patient Active Problem List   Diagnosis Date Noted   Claudication [facility]) 07/08/2020   Chronic venous insufficiency 07/25/2019   Skin lesion of scalp 07/25/2019   Encounter for orthopedic follow-up care 08/14/2018   Carpal tunnel syndrome of right wrist 04/24/2018   Diabetic peripheral neuropathy ([place]) 04/24/2018   Morbid obesity ([place]) 01/27/2018   Osteoarthritis of  left knee 12/16/2017   Osteoarthritis of right knee 12/16/2017   Family history of prostate cancer 03/06/2017   Pure hypercholesterolemia 09/13/2016   Lumbar degenerative disc disease 07/22/2016   Spinal stenosis of lumbar region 11/03/2015   Abnormality of gait 09/15/2015   Restrictive lung disease 05/05/2015   Impotence of organic origin 02/05/2013   Hypogonadism male 02/05/2013   Elevated PSA 11/30/2011   OTHER DISEASES OF LUNG NOT ELSEWHERE CLASSIFIED 07/20/2010   GOUT 04/17/2010   SMOKER 04/17/2010    Sleep apnea 08/22/2009   Hypothyroidism 09/19/2007   Dyslipidemia associated with type 2 diabetes mellitus ([place]) 09/19/2007   Hypertension associated with diabetes ([place]) 09/19/2007   Osteoarthritis 09/19/2007    Past Medical History:  Diagnosis Date   ALCOHOL USE 10/24/2008   ANXIETY 09/19/2007   Cough 05/22/2008   Diabetes mellitus without complication ([place])    Phreesia 06/02/2020   DIABETES MELLITUS, TYPE II 09/19/2007   External thrombosed hemorrhoids 08/23/2008   GOUT 04/17/2010   HYPERLIPIDEMIA 09/19/2007   HYPERTENSION 09/19/2007   Hypertension    Phreesia 06/02/2020   HYPOTHYROIDISM 09/19/2007   Leg pain    OSTEOARTHRITIS 09/19/2007   OTHER DISEASES OF LUNG NOT ELSEWHERE CLASSIFIED 07/20/2010   Rash and other nonspecific skin eruption 09/19/2007   Restrictive lung disease    SCROTAL ABSCESS 05/27/2010   Sebaceous cyst 08/01/2008   SLEEP APNEA 08/22/2009   Sleep apnea    Phreesia 06/02/2020   SMOKER 04/17/2010   Thyroid disease    Phreesia 06/02/2020    Past Surgical History:  Procedure Laterality Date   COLONOSCOPY     NASAL SEPTUM SURGERY      Social History   Socioeconomic History   Marital status: Married    Spouse name: Not on file   Number of children: 2   Years of education: BS   Highest education level: Not on file  Occupational History   Occupation: Multimedia programmer: [employer]  Tobacco Use   Smoking status: Former Smoker    Packs/day: 0.50    Years: 30.00    Pack years: 15.00    Quit date: 11/05/2012    Years since quitting: 7.6   Smokeless tobacco: Never Used   Tobacco comment: 30 years at most off and on smoking 03/14/15  Substance and Sexual Activity   Alcohol use: Yes    Alcohol/week: 0.0 standard drinks    Comment: 2 drinks per day   Drug use: Yes    Comment: Occasional Marijuana use   Sexual activity: Not on file  Other Topics Concern   Not on file  Social History Narrative   Originally from [place]. Has  lived in [place], [place], [place], & [place] since 1993. 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 Currently has a dog & cats. Previously had a parrot (conure) 12 years ago for 1 year but in same house.  Has also had excessive exposure to pigeon feces. He has had very brief exposure to asbestos around 2000. No hot tub exposure. He has inhaled chemical fumes/gases/refridgerants through his work.      Marital status: married x 26 years      Children: 2 children (19, 47); no grandchildren      Lives: Lives at home with his wife      Employment: [employer]      Tobacco: none; quit in 2013; smoked x 1/2 ppd x 20  years      Alcohol:  10 drinks; usually 2 drinks per day and then skip two days.      Drugs:  None      Exercise: none; job is physically demanding      Right-handed.   1 cup caffeine daily.   Social Determinants of Health   Financial Resource Strain:    Difficulty of Paying Living Expenses: Not on file  Food Insecurity:    Worried About Charity fundraiser in the Last Year: Not on file   [employer] of Food in the Last Year: Not on file  Transportation Needs:    Lack of Transportation (Medical): Not on file   Lack of Transportation (Non-Medical): Not on file  Physical Activity:    Days of Exercise per Week: Not on file   Minutes of Exercise per Session: Not on file  Stress:    Feeling of Stress : Not on file  Social Connections:    Frequency of Communication with Friends and Family: Not on file   Frequency of Social Gatherings with Friends and Family: Not on file   Attends Religious Services: Not on file   Active Member of Clubs or Organizations: Not on file   Attends Archivist Meetings: Not on file   Marital Status: Not on file  Intimate Partner Violence:    Fear of Current or Ex-Partner: Not on file   Emotionally Abused: Not on file   Physically Abused: Not on file   Sexually Abused: Not on  file    Family History  Problem Relation Age of Onset   Breast cancer Mother        Breast Cancer   Parkinson's disease Mother    Heart disease Father        CABG, stenting cardiac   Cancer Brother 44       prostate cancer   Lung disease Neg Hx    Autoimmune disease Neg Hx      Review of Systems  Constitutional: Negative.  Negative for chills and fever.  HENT: Negative.  Negative for congestion and sore throat.   Respiratory: Positive for cough and wheezing. Negative for hemoptysis, sputum production and shortness of breath.   Cardiovascular: Negative.  Negative for chest pain and palpitations.  Gastrointestinal: Negative.  Negative for abdominal pain, diarrhea, nausea and vomiting.  Genitourinary: Negative.   Skin: Negative.  Negative for rash.  Neurological: Negative.  Negative for dizziness and headaches.  All other systems reviewed and are negative.     Today's Vitals   07/15/20 1438  BP: (!) 144/83  Pulse: 94  Temp: 98.5 F (36.9 C)  SpO2: 92%  Weight: (!) 425 lb (192.8 kg)  Height: '6\' 3"'  (1.905 m)   Body mass index is 53.12 kg/m.  Physical Exam Vitals reviewed.  Constitutional:      Appearance: Normal appearance. He is obese.  HENT:     Head: Normocephalic.  Eyes:     Extraocular Movements: Extraocular movements intact.     Conjunctiva/sclera: Conjunctivae normal.     Pupils: Pupils are equal, round, and reactive to light.  Cardiovascular:     Rate and Rhythm: Normal rate and regular rhythm.     Pulses: Normal pulses.     Heart sounds: Normal heart sounds.  Pulmonary:     Effort: Pulmonary effort is normal.     Breath sounds: Wheezing (Occasional intermittent expiratory wheezing) present.  Musculoskeletal:     Cervical back: Neck supple. No  tenderness.  Skin:    Capillary Refill: Capillary refill takes less than 2 seconds.  Neurological:     General: No focal deficit present.     Mental Status: He is alert and oriented to person, place,  and time.  Psychiatric:        Mood and Affect: Mood normal.    DG Chest 2 View  Result Date: 07/15/2020 CLINICAL DATA:  Pneumonia follow-up. EXAM: CHEST - 2 VIEW COMPARISON:  07/08/2020.  12/30/2017.  CT 03/25/2015. FINDINGS: Prominent azygos vein again noted. Interval near complete clearing of right base infiltrate. Persistent right upper and right base pleural-parenchymal thickening consistent with scarring. Stable cardiomegaly. No pulmonary venous congestion. No pneumothorax. IMPRESSION: Interval near complete clearing of right base infiltrate. Persistent right upper and right base pleural-parenchymal scarring. Electronically Signed   By: Marcello Moores  Register   On: 07/15/2020 15:18     ASSESSMENT & PLAN: Eriverto was seen today for follow-up.  Diagnoses and all orders for this visit:  Pneumonia of right lower lobe due to infectious organism Comments: improved Orders: -     DG Chest 2 View -     doxycycline (VIBRA-TABS) 100 MG tablet; Take 1 tablet (100 mg total) by mouth 2 (two) times daily for 7 days. -     albuterol (VENTOLIN HFA) 108 (90 Base) MCG/ACT inhaler; Inhale 2 puffs into the lungs 2 (two) times daily.    Patient Instructions       If you have lab work done today you will be contacted with your lab results within the next 2 weeks.  If you have not heard from [place] then please contact us. The fastest way to get your results is to register for My Chart.   IF you received an x-ray today, you will receive an invoice from [facility]. Please contact [facility] at [phone] with questions or concerns regarding your invoice.   IF you received labwork today, you will receive an invoice from [place] [facility]. Please contact [facility] at [phone] with questions or concerns regarding your invoice.   Our billing staff will not be able to assist you with questions regarding bills from these companies.  You will be contacted with the lab results as soon as they are  available. The fastest way to get your results is to activate your My Chart account. Instructions are located on the last page of this paperwork. If you have not heard from [place] regarding the results in 2 weeks, please contact this office.     Community-Acquired Pneumonia, Adult Pneumonia is an infection of the lungs. It causes swelling in the airways of the lungs. Mucus and fluid may also build up inside the airways. One type of pneumonia can happen while a person is in a hospital. A different type can happen when a person is not in a hospital (community-acquired pneumonia).  What are the causes?  This condition is caused by germs (viruses, bacteria, or fungi). Some types of germs can be passed from one person to another. This can happen when you breathe in droplets from the cough or sneeze of an infected person. What increases the risk? You are more likely to develop this condition if you:  Have a long-term (chronic) disease, such as: ? Chronic obstructive pulmonary disease (COPD). ? Asthma. ? Cystic fibrosis. ? Congestive heart failure. ? Diabetes. ? Kidney disease.  Have HIV.  Have sickle cell disease.  Have had your spleen removed.  Do not take good care of your teeth and mouth (  poor dental hygiene).  Have a medical condition that increases the risk of breathing in droplets from your own mouth and nose.  Have a weakened body defense system (immune system).  Are a smoker.  Travel to areas where the germs that cause this illness are common.  Are around certain animals or the places they live. What are the signs or symptoms?  A dry cough.  A wet (productive) cough.  Fever.  Sweating.  Chest pain. This often happens when breathing deeply or coughing.  Fast breathing or trouble breathing.  Shortness of breath.  Shaking chills.  Feeling tired (fatigue).  Muscle aches. How is this treated? Treatment for this condition depends on many things. Most adults can be  treated at home. In some cases, treatment must happen in a hospital. Treatment may include:  Medicines given by mouth or through an IV tube.  Being given extra oxygen.  Respiratory therapy. In rare cases, treatment for very bad pneumonia may include:  Using a machine to help you breathe.  Having a procedure to remove fluid from around your lungs. Follow these instructions at home: Medicines  Take over-the-counter and prescription medicines only as told by your doctor. ? Only take cough medicine if you are losing sleep.  If you were prescribed an antibiotic medicine, take it as told by your doctor. Do not stop taking the antibiotic even if you start to feel better. General instructions   Sleep with your head and neck raised (elevated). You can do this by sleeping in a recliner or by putting a few pillows under your head.  Rest as needed. Get at least 8 hours of sleep each night.  Drink enough water to keep your pee (urine) pale yellow.  Eat a healthy diet that includes plenty of vegetables, fruits, whole grains, low-fat dairy products, and lean protein.  Do not use any products that contain nicotine or tobacco. These include cigarettes, e-cigarettes, and chewing tobacco. If you need help quitting, ask your doctor.  Keep all follow-up visits as told by your doctor. This is important. How is this prevented? A shot (vaccine) can help prevent pneumonia. Shots are often suggested for:  People older than 59 years of age.  People older than 59 years of age who: ? Are having cancer treatment. ? Have long-term (chronic) lung disease. ? Have problems with their body's defense system. You may also prevent pneumonia if you take these actions:  Get the flu (influenza) shot every year.  Go to the dentist as often as told.  Wash your hands often. If you cannot use soap and water, use hand sanitizer. Contact a doctor if:  You have a fever.  You lose sleep because your cough  medicine does not help. Get help right away if:  You are short of breath and it gets worse.  You have more chest pain.  Your sickness gets worse. This is very serious if: ? You are an older adult. ? Your body's defense system is weak.  You cough up blood. Summary  Pneumonia is an infection of the lungs.  Most adults can be treated at home. Some will need treatment in a hospital.  Drink enough water to keep your pee pale yellow.  Get at least 8 hours of sleep each night. This information is not intended to replace advice given to you by your health care provider. Make sure you discuss any questions you have with your health care provider. Document Revised: 11/22/2018 Document Reviewed: 03/30/2018 Elsevier Patient  Education  2020 [employer].      Agustina Caroli, MD [facility]

## 2020-07-15 NOTE — Patient Instructions (Addendum)
   If you have lab work done today you will be contacted with your lab results within the next 2 weeks.  If you have not heard from us then please contact us. The fastest way to get your results is to register for My Chart.   IF you received an x-ray today, you will receive an invoice from [facility]. Please contact [facility] at [phone] with questions or concerns regarding your invoice.   IF you received labwork today, you will receive an invoice from [facility]. Please contact [facility] at [phone] with questions or concerns regarding your invoice.   Our billing staff will not be able to assist you with questions regarding bills from these companies.  You will be contacted with the lab results as soon as they are available. The fastest way to get your results is to activate your My Chart account. Instructions are located on the last page of this paperwork. If you have not heard from us regarding the results in 2 weeks, please contact this office.     Community-Acquired Pneumonia, Adult Pneumonia is an infection of the lungs. It causes swelling in the airways of the lungs. Mucus and fluid may also build up inside the airways. One type of pneumonia can happen while a person is in a hospital. A different type can happen when a person is not in a hospital (community-acquired pneumonia).  What are the causes?  This condition is caused by germs (viruses, bacteria, or fungi). Some types of germs can be passed from one person to another. This can happen when you breathe in droplets from the cough or sneeze of an infected person. What increases the risk? You are more likely to develop this condition if you:  Have a long-term (chronic) disease, such as: ? Chronic obstructive pulmonary disease (COPD). ? Asthma. ? Cystic fibrosis. ? Congestive heart failure. ? Diabetes. ? Kidney disease.  Have HIV.  Have sickle cell disease.  Have had your spleen removed.  Do  not take good care of your teeth and mouth (poor dental hygiene).  Have a medical condition that increases the risk of breathing in droplets from your own mouth and nose.  Have a weakened body defense system (immune system).  Are a smoker.  Travel to areas where the germs that cause this illness are common.  Are around certain animals or the places they live. What are the signs or symptoms?  A dry cough.  A wet (productive) cough.  Fever.  Sweating.  Chest pain. This often happens when breathing deeply or coughing.  Fast breathing or trouble breathing.  Shortness of breath.  Shaking chills.  Feeling tired (fatigue).  Muscle aches. How is this treated? Treatment for this condition depends on many things. Most adults can be treated at home. In some cases, treatment must happen in a hospital. Treatment may include:  Medicines given by mouth or through an IV tube.  Being given extra oxygen.  Respiratory therapy. In rare cases, treatment for very bad pneumonia may include:  Using a machine to help you breathe.  Having a procedure to remove fluid from around your lungs. Follow these instructions at home: Medicines  Take over-the-counter and prescription medicines only as told by your doctor. ? Only take cough medicine if you are losing sleep.  If you were prescribed an antibiotic medicine, take it as told by your doctor. Do not stop taking the antibiotic even if you start to feel better. General instructions   Sleep with   your head and neck raised (elevated). You can do this by sleeping in a recliner or by putting a few pillows under your head. °· Rest as needed. Get at least 8 hours of sleep each night. °· Drink enough water to keep your pee (urine) pale yellow. °· Eat a healthy diet that includes plenty of vegetables, fruits, whole grains, low-fat dairy products, and lean protein. °· Do not use any products that contain nicotine or tobacco. These include cigarettes,  e-cigarettes, and chewing tobacco. If you need help quitting, ask your doctor. °· Keep all follow-up visits as told by your doctor. This is important. °How is this prevented? °A shot (vaccine) can help prevent pneumonia. Shots are often suggested for: °· People older than 59 years of age. °· People older than 59 years of age who: °? Are having cancer treatment. °? Have long-term (chronic) lung disease. °? Have problems with their body's defense system. °You may also prevent pneumonia if you take these actions: °· Get the flu (influenza) shot every year. °· Go to the dentist as often as told. °· Wash your hands often. If you cannot use soap and water, use hand sanitizer. °Contact a doctor if: °· You have a fever. °· You lose sleep because your cough medicine does not help. °Get help right away if: °· You are short of breath and it gets worse. °· You have more chest pain. °· Your sickness gets worse. This is very serious if: °? You are an older adult. °? Your body's defense system is weak. °· You cough up blood. °Summary °· Pneumonia is an infection of the lungs. °· Most adults can be treated at home. Some will need treatment in a hospital. °· Drink enough water to keep your pee pale yellow. °· Get at least 8 hours of sleep each night. °This information is not intended to replace advice given to you by your health care provider. Make sure you discuss any questions you have with your health care provider. °Document Revised: 11/22/2018 Document Reviewed: 03/30/2018 °Elsevier Patient Education © 2020 Elsevier Inc. ° °

## 2020-07-20 ENCOUNTER — Other Ambulatory Visit: Payer: Self-pay | Admitting: Emergency Medicine

## 2020-07-20 DIAGNOSIS — G8929 Other chronic pain: Secondary | ICD-10-CM

## 2020-07-20 NOTE — Telephone Encounter (Signed)
Requested Prescriptions  Pending Prescriptions Disp Refills  . diclofenac (VOLTAREN) 75 MG EC tablet [Pharmacy Med Name: DICLOFENAC SOD EC 75 MG TAB] 180 tablet 0    Sig: TAKE 1 TABLET BY MOUTH TWICE A DAY     Analgesics:  NSAIDS Passed - 07/20/2020  9:35 AM      Passed - Cr in normal range and within 360 days    Creat  Date Value Ref Range Status  09/01/2015 0.62 (L) 0.70 - 1.33 mg/dL Final   Creatinine, Ser  Date Value Ref Range Status  07/08/2020 0.98 0.76 - 1.27 mg/dL Final   Creatinine,U  Date Value Ref Range Status  08/21/2014 40.5 mg/dL Final         Passed - HGB in normal range and within 360 days    Hemoglobin  Date Value Ref Range Status  07/08/2020 13.6 11 - 14.6 g/dL Final  69/48/5462 70.3 13.0 - 17.7 g/dL Final         Passed - Patient is not pregnant      Passed - Valid encounter within last 12 months    Recent Outpatient Visits          5 days ago Pneumonia of right lower lobe due to infectious organism   [facility] [place], [place], MD   1 week ago Pneumonia of right lower lobe due to infectious organism   [facility] [place], [place], MD   1 month ago Benign paroxysmal positional vertigo due to bilateral vestibular disorder   [facility] [place], [place], MD   1 month ago Benign paroxysmal positional vertigo due to bilateral vestibular disorder   [facility] at [facility], [place], MD   4 months ago Hypertension associated with diabetes [facility])   [facility] at [facility], Eilleen Kempf, MD      Future Appointments            In 1 month Sagard[place], Eilleen Kempf, MD [facility] at [place], [facility]

## 2020-07-21 ENCOUNTER — Telehealth: Payer: Self-pay | Admitting: Emergency Medicine

## 2020-07-21 NOTE — Telephone Encounter (Signed)
Received a fax from after hours on 07/19/20 stating pt is needing an emergency script for his insulin pens. He tried to get a loaner dose form his pharmacy.. Pt is out of Novolog insulin pen. Pt received an emergence box at Thanksgiving due to no refills. Insulin scripts come through the endocrinologist. Endo doctor Doesn't have an after hours service. Please advise.

## 2020-07-22 NOTE — Telephone Encounter (Signed)
LVM for pt to return call if he is still needing insulin refills . Endocrine is not in system.

## 2020-07-28 ENCOUNTER — Telehealth: Payer: Self-pay | Admitting: Emergency Medicine

## 2020-07-28 DIAGNOSIS — R0981 Nasal congestion: Secondary | ICD-10-CM

## 2020-07-28 NOTE — Telephone Encounter (Signed)
Called pt and notified him that the referral has been sent. Pt stated understanding.

## 2020-07-28 NOTE — Telephone Encounter (Signed)
What is the referral for?  Thanks

## 2020-07-28 NOTE — Telephone Encounter (Signed)
Pt has been using his inhaler and is still having breathing issues. He would love a referral to an ENT Specialist. Please advise at [phone].

## 2020-07-28 NOTE — Telephone Encounter (Signed)
Pt is requesting an ENT referral. I have pended the order. Please sign if it is appropriate.

## 2020-07-28 NOTE — Telephone Encounter (Addendum)
Gargling in the throat for about a couple of weeks. And inhaler is not helping. Still has excess flam and mucus and feels like it is harder to breath. Don't feels like its in the lungs he feels as if the throat is getting smaller. Once cough up some flam and then he feels there is pressure relieved and he has to do it every 10 -20 min day and night now.

## 2020-07-28 NOTE — Telephone Encounter (Signed)
Okay, thanks. ENT referral sent.

## 2020-08-05 ENCOUNTER — Other Ambulatory Visit: Payer: Self-pay | Admitting: Emergency Medicine

## 2020-08-05 ENCOUNTER — Other Ambulatory Visit: Payer: Self-pay

## 2020-08-05 DIAGNOSIS — G6289 Other specified polyneuropathies: Secondary | ICD-10-CM

## 2020-08-05 DIAGNOSIS — J189 Pneumonia, unspecified organism: Secondary | ICD-10-CM

## 2020-08-05 NOTE — Telephone Encounter (Signed)
Requested medication (s) are due for refill today: no  Requested medication (s) are on the active medication list: yes  Last refill:  07/17/2020  Future visit scheduled:yes  Notes to clinic:  this refill cannot be delegated    Requested Prescriptions  Pending Prescriptions Disp Refills   topiramate (TOPAMAX) 50 MG tablet [Pharmacy Med Name: TOPIRAMATE 50 MG TABLET] 180 tablet 1    Sig: TAKE 2 TABLETS (100 MG TOTAL) BY MOUTH AT BEDTIME.      Not Delegated - Neurology: Anticonvulsants - topiramate & zonisamide Failed - 08/05/2020 11:17 AM      Failed - This refill cannot be delegated      Passed - Cr in normal range and within 360 days    Creat  Date Value Ref Range Status  09/01/2015 0.62 (L) 0.70 - 1.33 mg/dL Final   Creatinine, Ser  Date Value Ref Range Status  07/08/2020 0.98 0.76 - 1.27 mg/dL Final   Creatinine,U  Date Value Ref Range Status  08/21/2014 40.5 mg/dL Final          Passed - CO2 in normal range and within 360 days    CO2  Date Value Ref Range Status  07/08/2020 23 20 - 29 mmol/L Final          Passed - Valid encounter within last 12 months    Recent Outpatient Visits           3 weeks ago Pneumonia of right lower lobe due to infectious organism   Primary Care at [place], [place], MD   4 weeks ago Pneumonia of right lower lobe due to infectious organism   Primary Care at [place], [place], [place]   1 month ago Benign paroxysmal positional vertigo due to bilateral vestibular disorder   Primary Care at [place], [place], MD   2 months ago Benign paroxysmal positional vertigo due to bilateral vestibular disorder   Primary Care at [place], [place], MD   5 months ago Hypertension associated with diabetes [facility])   Primary Care at [facility], Eilleen Kempf, MD       Future Appointments             In 3 weeks Sagardia, Eilleen Kempf, MD Primary Care at [place], [facility]              Signed Prescriptions  Disp Refills   albuterol (VENTOLIN HFA) 108 (90 Base) MCG/ACT inhaler 6.7 each 0    Sig: TAKE 2 PUFFS BY MOUTH TWICE A DAY      Pulmonology:  Beta Agonists Failed - 08/05/2020 11:17 AM      Failed - One inhaler should last at least one month. If the patient is requesting refills earlier, contact the patient to check for uncontrolled symptoms.      Passed - Valid encounter within last 12 months    Recent Outpatient Visits           3 weeks ago Pneumonia of right lower lobe due to infectious organism   Primary Care at [facility], [place], MD   4 weeks ago Pneumonia of right lower lobe due to infectious organism   Primary Care at [place], [place], MD   1 month ago Benign paroxysmal positional vertigo due to bilateral vestibular disorder   Primary Care at [place], [place], MD   2 months ago Benign paroxysmal positional vertigo due to bilateral vestibular disorder   Primary Care at [facility],  Eilleen Kempf, MD   5 months ago Hypertension associated with diabetes [facility])   Primary Care at [facility], Eilleen Kempf, MD       Future Appointments             In 3 weeks Sagardia, Eilleen Kempf, MD Primary Care at [place], [facility]

## 2020-08-16 ENCOUNTER — Other Ambulatory Visit: Payer: Self-pay | Admitting: Emergency Medicine

## 2020-08-16 DIAGNOSIS — F418 Other specified anxiety disorders: Secondary | ICD-10-CM

## 2020-08-18 NOTE — Telephone Encounter (Signed)
Rich I am not sure who will be handling Dr Irving Shows messages since he is out of the office. If someone else is assigned then please let me know. Thanks  Patient is requesting a refill of the following medications: Requested Prescriptions   Pending Prescriptions Disp Refills   ALPRAZolam (XANAX) 0.5 MG tablet [Pharmacy Med Name: ALPRAZOLAM 0.5 MG TABLET] 30 tablet 1    Sig: TAKE 1 TABLET BY MOUTH EVERY DAY AS NEEDED FOR ANXIETY   Refused Prescriptions Disp Refills   traZODone (DESYREL) 100 MG tablet [Pharmacy Med Name: TRAZODONE 100 MG TABLET] 90 tablet 1    Sig: TAKE 1 TABLET BY MOUTH EVERYDAY AT BEDTIME    Refused By: Elby Beck F    Reason for Refusal: Refill not appropriate    Date of patient request:08/18/20 Last office visit: 07/15/20 Date of last refill: 02/27/20 Last refill amount: 30-1RF Follow up time period per chart: 08/28/20

## 2020-08-18 NOTE — Telephone Encounter (Signed)
Requested medication (s) are due for refill today - yes  Requested medication (s) are on the active medication list -yes  Future visit scheduled -yes  Last refill: 04/29/20  Notes to clinic: Request- non delegated   Requested Prescriptions  Pending Prescriptions Disp Refills   ALPRAZolam (XANAX) 0.5 MG tablet [Pharmacy Med Name: ALPRAZOLAM 0.5 MG TABLET] 30 tablet 1    Sig: TAKE 1 TABLET BY MOUTH EVERY DAY AS NEEDED FOR ANXIETY      Not Delegated - Psychiatry:  Anxiolytics/Hypnotics Failed - 08/16/2020 12:30 PM      Failed - This refill cannot be delegated      Failed - Urine Drug Screen completed in last 360 days      Passed - Valid encounter within last 6 months    Recent Outpatient Visits           1 month ago Pneumonia of right lower lobe due to infectious organism   Primary Care at [place], [place], MD   1 month ago Pneumonia of right lower lobe due to infectious organism   Primary Care at [place], [place], MD   2 months ago Benign paroxysmal positional vertigo due to bilateral vestibular disorder   Primary Care at [place], [place], MD   2 months ago Benign paroxysmal positional vertigo due to bilateral vestibular disorder   Primary Care at [facility], [place], MD   5 months ago Hypertension associated with diabetes [facility])   Primary Care at [facility], Eilleen Kempf, MD       Future Appointments             In 1 week Sagardia, Eilleen Kempf, MD Primary Care at [place], [facility]              Refused Prescriptions Disp Refills   traZODone (DESYREL) 100 MG tablet [Pharmacy Med Name: TRAZODONE 100 MG TABLET] 90 tablet 1    Sig: TAKE 1 TABLET BY MOUTH EVERYDAY AT BEDTIME      Psychiatry: Antidepressants - Serotonin Modulator Passed - 08/16/2020 12:30 PM      Passed - Valid encounter within last 6 months    Recent Outpatient Visits           1 month ago Pneumonia of right lower lobe due to infectious organism   Primary Care  at [place], [place], MD   1 month ago Pneumonia of right lower lobe due to infectious organism   Primary Care at [place], [place], MD   2 months ago Benign paroxysmal positional vertigo due to bilateral vestibular disorder   Primary Care at [place], [place], MD   2 months ago Benign paroxysmal positional vertigo due to bilateral vestibular disorder   Primary Care at [place], [place], MD   5 months ago Hypertension associated with diabetes [facility])   Primary Care at [facility], Eilleen Kempf, MD       Future Appointments             In 1 week Sagardia, Eilleen Kempf, MD Primary Care at [place], [facility]                 Requested Prescriptions  Pending Prescriptions Disp Refills   ALPRAZolam (XANAX) 0.5 MG tablet [Pharmacy Med Name: ALPRAZOLAM 0.5 MG TABLET] 30 tablet 1    Sig: TAKE 1 TABLET BY MOUTH EVERY DAY AS NEEDED FOR ANXIETY      Not Delegated - Psychiatry:  Anxiolytics/Hypnotics Failed -  08/16/2020 12:30 PM      Failed - This refill cannot be delegated      Failed - Urine Drug Screen completed in last 360 days      Passed - Valid encounter within last 6 months    Recent Outpatient Visits           1 month ago Pneumonia of right lower lobe due to infectious organism   Primary Care at [facility], [place], MD   1 month ago Pneumonia of right lower lobe due to infectious organism   Primary Care at [place], [place], MD   2 months ago Benign paroxysmal positional vertigo due to bilateral vestibular disorder   Primary Care at [place], [place], MD   2 months ago Benign paroxysmal positional vertigo due to bilateral vestibular disorder   Primary Care at [facility], [place], MD   5 months ago Hypertension associated with diabetes [facility])   Primary Care at [facility], Eilleen Kempf, MD       Future Appointments             In 1 week Sagardia, Eilleen Kempf, MD Primary Care at [place], [facility]               Refused Prescriptions Disp Refills   traZODone (DESYREL) 100 MG tablet [Pharmacy Med Name: TRAZODONE 100 MG TABLET] 90 tablet 1    Sig: TAKE 1 TABLET BY MOUTH EVERYDAY AT BEDTIME      Psychiatry: Antidepressants - Serotonin Modulator Passed - 08/16/2020 12:30 PM      Passed - Valid encounter within last 6 months    Recent Outpatient Visits           1 month ago Pneumonia of right lower lobe due to infectious organism   Primary Care at [place], [place], MD   1 month ago Pneumonia of right lower lobe due to infectious organism   Primary Care at [place], [place], MD   2 months ago Benign paroxysmal positional vertigo due to bilateral vestibular disorder   Primary Care at [place], [place], MD   2 months ago Benign paroxysmal positional vertigo due to bilateral vestibular disorder   Primary Care at [facility], [place], MD   5 months ago Hypertension associated with diabetes [facility])   Primary Care at [facility], Eilleen Kempf, MD       Future Appointments             In 1 week Sagardia, Eilleen Kempf, MD Primary Care at [place], [facility]

## 2020-08-18 NOTE — Telephone Encounter (Signed)
Requested medication (s) are due for refill today -yes  Requested medication (s) are on the active medication list -yes  Future visit scheduled -yes  Last refill: 04/29/20  Notes to clinic: Request non delegated Rx  Requested Prescriptions  Pending Prescriptions Disp Refills   ALPRAZolam (XANAX) 0.5 MG tablet [Pharmacy Med Name: ALPRAZOLAM 0.5 MG TABLET] 30 tablet 1    Sig: TAKE 1 TABLET BY MOUTH EVERY DAY AS NEEDED FOR ANXIETY      Not Delegated - Psychiatry:  Anxiolytics/Hypnotics Failed - 08/18/2020  8:59 AM      Failed - This refill cannot be delegated      Failed - Urine Drug Screen completed in last 360 days      Passed - Valid encounter within last 6 months    Recent Outpatient Visits           1 month ago Pneumonia of right lower lobe due to infectious organism   Primary Care at [place], [place], [place]   1 month ago Pneumonia of right lower lobe due to infectious organism   Primary Care at [place], [place], MD   2 months ago Benign paroxysmal positional vertigo due to bilateral vestibular disorder   Primary Care at [place], [place], MD   2 months ago Benign paroxysmal positional vertigo due to bilateral vestibular disorder   Primary Care at [facility], [place], MD   5 months ago Hypertension associated with diabetes [facility])   Primary Care at [facility], Eilleen Kempf, MD       Future Appointments             In 1 week Sagardia, Eilleen Kempf, MD Primary Care at [place], [facility]              Refused Prescriptions Disp Refills   traZODone (DESYREL) 100 MG tablet [Pharmacy Med Name: TRAZODONE 100 MG TABLET] 90 tablet 1    Sig: TAKE 1 TABLET BY MOUTH EVERYDAY AT BEDTIME      Psychiatry: Antidepressants - Serotonin Modulator Passed - 08/18/2020  8:59 AM      Passed - Valid encounter within last 6 months    Recent Outpatient Visits           1 month ago Pneumonia of right lower lobe due to infectious organism   Primary Care  at [place], [place], MD   1 month ago Pneumonia of right lower lobe due to infectious organism   Primary Care at [place], [place], MD   2 months ago Benign paroxysmal positional vertigo due to bilateral vestibular disorder   Primary Care at [place], [place], MD   2 months ago Benign paroxysmal positional vertigo due to bilateral vestibular disorder   Primary Care at [place], [place], MD   5 months ago Hypertension associated with diabetes [facility])   Primary Care at [facility], Eilleen Kempf, MD       Future Appointments             In 1 week Sagardia, Eilleen Kempf, MD Primary Care at [place], [facility]                 Requested Prescriptions  Pending Prescriptions Disp Refills   ALPRAZolam (XANAX) 0.5 MG tablet [Pharmacy Med Name: ALPRAZOLAM 0.5 MG TABLET] 30 tablet 1    Sig: TAKE 1 TABLET BY MOUTH EVERY DAY AS NEEDED FOR ANXIETY      Not Delegated - Psychiatry:  Anxiolytics/Hypnotics  Failed - 08/18/2020  8:59 AM      Failed - This refill cannot be delegated      Failed - Urine Drug Screen completed in last 360 days      Passed - Valid encounter within last 6 months    Recent Outpatient Visits           1 month ago Pneumonia of right lower lobe due to infectious organism   Primary Care at [facility], [place], MD   1 month ago Pneumonia of right lower lobe due to infectious organism   Primary Care at [place], [place], MD   2 months ago Benign paroxysmal positional vertigo due to bilateral vestibular disorder   Primary Care at [place], [place], MD   2 months ago Benign paroxysmal positional vertigo due to bilateral vestibular disorder   [facility] at [facility], [place], MD   5 months ago Hypertension associated with diabetes [facility])   Primary Care at [facility], Eilleen Kempf, MD       Future Appointments             In 1 week Sagardia, Eilleen Kempf, MD Primary Care at [place], [facility]               Refused Prescriptions Disp Refills   traZODone (DESYREL) 100 MG tablet [Pharmacy Med Name: TRAZODONE 100 MG TABLET] 90 tablet 1    Sig: TAKE 1 TABLET BY MOUTH EVERYDAY AT BEDTIME      Psychiatry: Antidepressants - Serotonin Modulator Passed - 08/18/2020  8:59 AM      Passed - Valid encounter within last 6 months    Recent Outpatient Visits           1 month ago Pneumonia of right lower lobe due to infectious organism   Primary Care at [place], [place], MD   1 month ago Pneumonia of right lower lobe due to infectious organism   Primary Care at [place], [place], MD   2 months ago Benign paroxysmal positional vertigo due to bilateral vestibular disorder   Primary Care at [place], [place], MD   2 months ago Benign paroxysmal positional vertigo due to bilateral vestibular disorder   Primary Care at [facility], [place], MD   5 months ago Hypertension associated with diabetes [facility])   Primary Care at [facility], Eilleen Kempf, MD       Future Appointments             In 1 week Sagardia, Eilleen Kempf, MD Primary Care at [place], [facility]

## 2020-08-28 ENCOUNTER — Ambulatory Visit (INDEPENDENT_AMBULATORY_CARE_PROVIDER_SITE_OTHER): Payer: BC Managed Care – PPO | Admitting: Emergency Medicine

## 2020-08-28 ENCOUNTER — Other Ambulatory Visit: Payer: Self-pay

## 2020-08-28 ENCOUNTER — Encounter: Payer: Self-pay | Admitting: Emergency Medicine

## 2020-08-28 VITALS — BP 168/78 | HR 95 | Temp 98.6°F | Resp 16 | Ht 75.0 in | Wt >= 6400 oz

## 2020-08-28 DIAGNOSIS — M8949 Other hypertrophic osteoarthropathy, multiple sites: Secondary | ICD-10-CM

## 2020-08-28 DIAGNOSIS — E1169 Type 2 diabetes mellitus with other specified complication: Secondary | ICD-10-CM

## 2020-08-28 DIAGNOSIS — E1159 Type 2 diabetes mellitus with other circulatory complications: Secondary | ICD-10-CM

## 2020-08-28 DIAGNOSIS — G4733 Obstructive sleep apnea (adult) (pediatric): Secondary | ICD-10-CM | POA: Diagnosis not present

## 2020-08-28 DIAGNOSIS — I152 Hypertension secondary to endocrine disorders: Secondary | ICD-10-CM

## 2020-08-28 DIAGNOSIS — M48062 Spinal stenosis, lumbar region with neurogenic claudication: Secondary | ICD-10-CM

## 2020-08-28 DIAGNOSIS — M159 Polyosteoarthritis, unspecified: Secondary | ICD-10-CM

## 2020-08-28 DIAGNOSIS — E785 Hyperlipidemia, unspecified: Secondary | ICD-10-CM

## 2020-08-28 DIAGNOSIS — M5136 Other intervertebral disc degeneration, lumbar region: Secondary | ICD-10-CM

## 2020-08-28 MED ORDER — LOSARTAN POTASSIUM-HCTZ 100-12.5 MG PO TABS: 1.0000 | ORAL_TABLET | Freq: Every day | ORAL | 3 refills | Status: DC

## 2020-08-28 NOTE — Progress Notes (Signed)
Johnny Andrews 60 y.o.   Chief Complaint  Patient presents with  . Diabetes  . Hypertension    Follow up 6 month    HISTORY OF PRESENT ILLNESS: This is a 60 y.o. male with history of diabetes and hypertension here for follow-up. Doing well.  Has no complaints or medical concerns today. Has been off blood pressure medication for the last 3 days. Sees endocrinologist on a regular basis. Fully vaccinated against COVID with a booster. Recently saw ENT doctor.  Had laryngoscopy done.  Told he needed to hydrate more. Using humidifier along with CPAP machine.  Working very well for him. Lab Results  Component Value Date   HGBA1C 7.4 (A) 07/08/2020   BP Readings from Last 3 Encounters:  08/28/20 (!) 168/78  07/15/20 (!) 144/83  07/08/20 (!) 160/82      HPI   Prior to Admission medications   Medication Sig Start Date End Date Taking? Authorizing Provider  albuterol (VENTOLIN HFA) 108 (90 Base) MCG/ACT inhaler TAKE 2 PUFFS BY MOUTH TWICE A DAY 08/05/20   Horald Pollen, MD  ALPRAZolam [facility]) 0.5 MG tablet TAKE 1 TABLET BY MOUTH EVERY DAY AS NEEDED FOR ANXIETY 08/18/20   Maximiano Coss, NP  azithromycin [facility]) 250 MG tablet Sig as indicated 07/08/20   Horald Pollen, MD  blood glucose meter kit and supplies 1 each by Other route 2 (two) times daily. 03/14/20   Horald Pollen, MD  diclofenac (VOLTAREN) 75 MG EC tablet TAKE 1 TABLET BY MOUTH TWICE A DAY 07/20/20   Horald Pollen, MD  empagliflozin (JARDIANCE) 25 MG TABS tablet Take 25 mg by mouth daily.     [provider]  glucose blood (ONE TOUCH ULTRA TEST) test strip Two times a day for ULTRA 2 ONE TOUCH 02/28/20   Horald Pollen, MD  insulin aspart (NOVOLOG FLEXPEN) 100 UNIT/ML FlexPen Inject 75 Units into the skin 2 (two) times daily before a meal. 30 units am and 50 units pm    [provider]  Insulin Pen Needle (BD ULTRA-FINE PEN NEEDLES) 29G X 12.7MM MISC USE TWO CHECK  BLOOD SUGAR TWICE A DAY. APPOINTMENT NEEDED FOR FURTHER REFILLS 02/28/20   Horald Pollen, MD  levothyroxine (SYNTHROID) 137 MCG tablet Take 2 tablets (274 mcg total) by mouth daily before breakfast. 03/04/20 06/09/20  Horald Pollen, MD  losartan-hydrochlorothiazide [facility]) 100-12.5 MG tablet Take 1 tablet by mouth daily. 07/08/20   Horald Pollen, MD  meclizine (ANTIVERT) 50 MG tablet Take 1 tablet (50 mg total) by mouth 3 (three) times daily as needed. 06/09/20   Horald Pollen, MD  metFORMIN (GLUCOPHAGE-XR) 500 MG 24 hr tablet Take 1,000 mg by mouth 2 (two) times daily.  09/15/15   [provider]  Respiratory Therapy Supplies (ADULT MASK) MISC 2 Devices by Does not apply route once. 11/24/11   Rigoberto Noel, MD  simvastatin (ZOCOR) 20 MG tablet Take 20 mg by mouth daily.    [provider]  topiramate (TOPAMAX) 50 MG tablet TAKE 2 TABLETS (100 MG TOTAL) BY MOUTH AT BEDTIME. 08/05/20   Horald Pollen, MD  traZODone (DESYREL) 100 MG tablet TAKE 1 TABLET BY MOUTH EVERYDAY AT BEDTIME 04/24/20   Horald Pollen, MD  TRESIBA FLEXTOUCH 200 UNIT/ML SOPN INJECT 100 UNITS TWICE A DAY 12/17/16   [provider]    Allergies  Allergen Reactions  . Other     Patient Active Problem List  Diagnosis Date Noted  . Claudication ([place]) 07/08/2020  . Chronic venous insufficiency 07/25/2019  . Skin lesion of scalp 07/25/2019  . Encounter for orthopedic follow-up care 08/14/2018  . Carpal tunnel syndrome of right wrist 04/24/2018  . Diabetic peripheral neuropathy ([place]) 04/24/2018  . Morbid obesity ([place]) 01/27/2018  . Osteoarthritis of left knee 12/16/2017  . Osteoarthritis of right knee 12/16/2017  . Family history of prostate cancer 03/06/2017  . Pure hypercholesterolemia 09/13/2016  . Lumbar degenerative disc disease 07/22/2016  . Spinal stenosis of lumbar region 11/03/2015  . Abnormality of gait 09/15/2015  . Restrictive lung disease  05/05/2015  . 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 Hypogonadism male 02/05/2013  . Elevated PSA 11/30/2011  . OTHER DISEASES OF LUNG NOT ELSEWHERE CLASSIFIED 07/20/2010  . GOUT 04/17/2010  . SMOKER 04/17/2010  . Sleep apnea 08/22/2009  . Hypothyroidism 09/19/2007  . Dyslipidemia associated with type 2 diabetes mellitus ([place]) 09/19/2007  . Hypertension associated with diabetes ([place]) 09/19/2007  . Osteoarthritis 09/19/2007    Past Medical History:  Diagnosis Date  . ALCOHOL USE 10/24/2008  . ANXIETY 09/19/2007  . Cough 05/22/2008  . Diabetes mellitus without complication ([place])    Phreesia 06/02/2020  . DIABETES MELLITUS, TYPE II 09/19/2007  . External thrombosed hemorrhoids 08/23/2008  . GOUT 04/17/2010  . HYPERLIPIDEMIA 09/19/2007  . HYPERTENSION 09/19/2007  . Hypertension    Phreesia 06/02/2020  . HYPOTHYROIDISM 09/19/2007  . Leg pain   . OSTEOARTHRITIS 09/19/2007  . OTHER DISEASES OF LUNG NOT ELSEWHERE CLASSIFIED 07/20/2010  . Rash and other nonspecific skin eruption 09/19/2007  . Restrictive lung disease   . SCROTAL ABSCESS 05/27/2010  . Sebaceous cyst 08/01/2008  . SLEEP APNEA 08/22/2009  . Sleep apnea    Phreesia 06/02/2020  . SMOKER 04/17/2010  . Thyroid disease    Phreesia 06/02/2020    Past Surgical History:  Procedure Laterality Date  . COLONOSCOPY    . NASAL SEPTUM SURGERY      Social History   Socioeconomic History  . Marital status: Married    Spouse name: Not on file  . Number of children: 2  . Years of education: BS  . Highest education level: Not on file  Occupational History  . Occupation: Multimedia programmer: [employer]  Tobacco Use  . Smoking status: Former Smoker    Packs/day: 0.50    Years: 30.00    Pack years: 15.00    Quit date: 11/05/2012    Years since quitting: 7.8  . Smokeless tobacco: Never Used  . Tobacco comment: 30 years at most off and on smoking 03/14/15  Substance and Sexual Activity  . Alcohol use: Yes    Alcohol/week: 0.0 standard  drinks    Comment: 2 drinks per day  . Drug use: Yes    Comment: Occasional Marijuana use  . Sexual activity: Not on file  Other Topics Concern  . Not on file  Social History Narrative   Originally from [place]. Has lived in [place], [place], [place], & [place] since 1993. 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 Currently has a dog & cats. Previously had a parrot (conure) 12 years ago for 1 year but in same house.  Has also had excessive exposure to pigeon feces. He has had very brief exposure to asbestos around 2000. No hot tub exposure. He has inhaled chemical fumes/gases/refridgerants through his work.      Marital  status: married x 26 years      Children: 2 children (19, 20); no grandchildren      Lives: Lives at home with his wife      Employment: [employer]      Tobacco: none; quit in 2013; smoked x 1/2 ppd x 20 years      Alcohol:  10 drinks; usually 2 drinks per day and then skip two days.      Drugs:  None      Exercise: none; job is physically demanding      Right-handed.   1 cup caffeine daily.   Social Determinants of Health   Financial Resource Strain: Not on file  Food Insecurity: Not on file  Transportation Needs: Not on file  Physical Activity: Not on file  Stress: Not on file  Social Connections: Not on file  Intimate Partner Violence: Not on file    Family History  Problem Relation Age of Onset  . Breast cancer Mother        Breast Cancer  . Parkinson's disease Mother   . Heart disease Father        CABG, stenting cardiac  . Cancer Brother 81       prostate cancer  . Lung disease Neg Hx   . Autoimmune disease Neg Hx      Review of Systems  Constitutional: Negative.  Negative for chills and fever.  HENT: Negative.  Negative for congestion and sore throat.   Respiratory: Negative.  Negative for cough and hemoptysis.   Cardiovascular: Negative.  Negative for chest pain and palpitations.   Gastrointestinal: Negative.  Negative for abdominal pain, blood in stool, diarrhea, melena, nausea and vomiting.  Genitourinary: Negative.  Negative for dysuria and hematuria.  Skin: Negative.  Negative for rash.  Neurological: Negative.  Negative for dizziness and headaches.  All other systems reviewed and are negative.    Today's Vitals   08/28/20 0816  BP: (!) 168/78  Pulse: 95  Resp: 16  Temp: 98.6 F (37 C)  TempSrc: Temporal  SpO2: 93%  Weight: (!) 427 lb (193.7 kg)  Height: '6\' 3"'  (1.905 m)   Body mass index is 53.37 kg/m.   Physical Exam Vitals reviewed.  Constitutional:      Appearance: He is obese.  HENT:     Head: Normocephalic.  Eyes:     Extraocular Movements: Extraocular movements intact.     Pupils: Pupils are equal, round, and reactive to light.  Cardiovascular:     Rate and Rhythm: Normal rate and regular rhythm.     Pulses: Normal pulses.     Heart sounds: Normal heart sounds.  Pulmonary:     Effort: Pulmonary effort is normal.     Breath sounds: Normal breath sounds.  Musculoskeletal:     Cervical back: Normal range of motion and neck supple.     Right lower leg: No edema.     Left lower leg: No edema.  Skin:    General: Skin is warm.     Capillary Refill: Capillary refill takes less than 2 seconds.  Neurological:     General: No focal deficit present.     Mental Status: He is alert and oriented to person, place, and time.  Psychiatric:        Mood and Affect: Mood normal.        Behavior: Behavior normal.      ASSESSMENT & PLAN: Anson was seen today for diabetes and hypertension.  Diagnoses and all orders for this visit:  Hypertension associated with diabetes ([place]) -     losartan-hydrochlorothiazide (HYZAAR) 100-12.5 MG tablet; Take 1 tablet by mouth daily.  Morbid obesity ([place])  Dyslipidemia associated with type 2 diabetes mellitus ([place])  Obstructive sleep apnea syndrome  Primary osteoarthritis involving multiple  joints  Lumbar degenerative disc disease  Spinal stenosis of lumbar region with neurogenic claudication    Patient Instructions       If you have lab work done today you will be contacted with your lab results within the next 2 weeks.  If you have not heard from [place] then please contact us. The fastest way to get your results is to register for My Chart.   IF you received an x-ray today, you will receive an invoice from [facility]. Please contact [facility] at [phone] with questions or concerns regarding your invoice.   IF you received labwork today, you will receive an invoice from [place]. Please contact [facility] at [phone] with questions or concerns regarding your invoice.   Our billing staff will not be able to assist you with questions regarding bills from these companies.  You will be contacted with the lab results as soon as they are available. The fastest way to get your results is to activate your My Chart account. Instructions are located on the last page of this paperwork. If you have not heard from [place] regarding the results in 2 weeks, please contact this office.     Health Maintenance, Male Adopting a healthy lifestyle and getting preventive care are important in promoting health and wellness. Ask your health care provider about:  The right schedule for you to have regular tests and exams.  Things you can do on your own to prevent diseases and keep yourself healthy. What should I know about diet, weight, and exercise? Eat a healthy diet  Eat a diet that includes plenty of vegetables, fruits, low-fat dairy products, and lean protein.  Do not eat a lot of foods that are high in solid fats, added sugars, or sodium.   Maintain a healthy weight Body mass index (BMI) is a measurement that can be used to identify possible weight problems. It estimates body fat based on height and weight. Your health care provider can help determine your BMI  and help you achieve or maintain a healthy weight. Get regular exercise Get regular exercise. This is one of the most important things you can do for your health. Most adults should:  Exercise for at least 150 minutes each week. The exercise should increase your heart rate and make you sweat (moderate-intensity exercise).  Do strengthening exercises at least twice a week. This is in addition to the moderate-intensity exercise.  Spend less time sitting. Even light physical activity can be beneficial. Watch cholesterol and blood lipids Have your blood tested for lipids and cholesterol at 60 years of age, then have this test every 5 years. You may need to have your cholesterol levels checked more often if:  Your lipid or cholesterol levels are high.  You are older than 60 years of age.  You are at high risk for heart disease. What should I know about cancer screening? Many types of cancers can be detected early and may often be prevented. Depending on your health history and family history, you may need to have cancer screening at various ages. This may include screening for:  Colorectal cancer.  Prostate cancer.  Skin cancer.  Lung cancer. What should  I know about heart disease, diabetes, and high blood pressure? Blood pressure and heart disease  High blood pressure causes heart disease and increases the risk of stroke. This is more likely to develop in people who have high blood pressure readings, are of African descent, or are overweight.  Talk with your health care provider about your target blood pressure readings.  Have your blood pressure checked: ? Every 3-5 years if you are 25-38 years of age. ? Every year if you are 23 years old or older.  If you are between the ages of 29 and 42 and are a current or former smoker, ask your health care provider if you should have a one-time screening for abdominal aortic aneurysm (AAA). Diabetes Have regular diabetes screenings. This  checks your fasting blood sugar level. Have the screening done:  Once every three years after age 56 if you are at a normal weight and have a low risk for diabetes.  More often and at a younger age if you are overweight or have a high risk for diabetes. What should I know about preventing infection? Hepatitis B If you have a higher risk for hepatitis B, you should be screened for this virus. Talk with your health care provider to find out if you are at risk for hepatitis B infection. Hepatitis C Blood testing is recommended for:  Everyone born from [birth year] through 1965.  Anyone with known risk factors for hepatitis C. Sexually transmitted infections (STIs)  You should be screened each year for STIs, including gonorrhea and chlamydia, if: ? You are sexually active and are younger than 60 years of age. ? You are older than 60 years of age and your health care provider tells you that you are at risk for this type of infection. ? Your sexual activity has changed since you were last screened, and you are at increased risk for chlamydia or gonorrhea. Ask your health care provider if you are at risk.  Ask your health care provider about whether you are at high risk for HIV. Your health care provider may recommend a prescription medicine to help prevent HIV infection. If you choose to take medicine to prevent HIV, you should first get tested for HIV. You should then be tested every 3 months for as long as you are taking the medicine. Follow these instructions at home: Lifestyle  Do not use any products that contain nicotine or tobacco, such as cigarettes, e-cigarettes, and chewing tobacco. If you need help quitting, ask your health care provider.  Do not use street drugs.  Do not share needles.  Ask your health care provider for help if you need support or information about quitting drugs. Alcohol use  Do not drink alcohol if your health care provider tells you not to drink.  If you drink  alcohol: ? Limit how much you have to 0-2 drinks a day. ? Be aware of how much alcohol is in your drink. In the [place] one drink equals one 12 oz bottle of beer (355 mL), one 5 oz glass of wine (148 mL), or one 1 oz glass of hard liquor (44 mL). General instructions  Schedule regular health, dental, and eye exams.  Stay current with your vaccines.  Tell your health care provider if: ? You often feel depressed. ? You have ever been abused or do not feel safe at home. Summary  Adopting a healthy lifestyle and getting preventive care are important in promoting health and wellness.  Follow your health  care provider's instructions about healthy diet, exercising, and getting tested or screened for diseases.  Follow your health care provider's instructions on monitoring your cholesterol and blood pressure. This information is not intended to replace advice given to you by your health care provider. Make sure you discuss any questions you have with your health care provider. Document Revised: 07/26/2018 Document Reviewed: 07/26/2018 Elsevier Patient Education  2021 [place].  Hypertension, Adult High blood pressure (hypertension) is when the force of blood pumping through the arteries is too strong. The arteries are the blood vessels that carry blood from the heart throughout the body. Hypertension forces the heart to work harder to pump blood and may cause arteries to become narrow or stiff. Untreated or uncontrolled hypertension can cause a heart attack, heart failure, a stroke, kidney disease, and other problems. A blood pressure reading consists of a higher number over a lower number. Ideally, your blood pressure should be below 120/80. The first ("top") number is called the systolic pressure. It is a measure of the pressure in your arteries as your heart beats. The second ("bottom") number is called the diastolic pressure. It is a measure of the pressure in your arteries as the heart  relaxes. What are the causes? The exact cause of this condition is not known. There are some conditions that result in or are related to high blood pressure. What increases the risk? Some risk factors for high blood pressure are under your control. The following factors may make you more likely to develop this condition:  Smoking.  Having type 2 diabetes mellitus, high cholesterol, or both.  Not getting enough exercise or physical activity.  Being overweight.  Having too much fat, sugar, calories, or salt (sodium) in your diet.  Drinking too much alcohol. Some risk factors for high blood pressure may be difficult or impossible to change. Some of these factors include:  Having chronic kidney disease.  Having a family history of high blood pressure.  Age. Risk increases with age.  Race. You may be at higher risk if you are African American.  Gender. Men are at higher risk than women before age 13. After age 18, women are at higher risk than men.  Having obstructive sleep apnea.  Stress. What are the signs or symptoms? High blood pressure may not cause symptoms. Very high blood pressure (hypertensive crisis) may cause:  Headache.  Anxiety.  Shortness of breath.  Nosebleed.  Nausea and vomiting.  Vision changes.  Severe chest pain.  Seizures. How is this diagnosed? This condition is diagnosed by measuring your blood pressure while you are seated, with your arm resting on a flat surface, your legs uncrossed, and your feet flat on the floor. The cuff of the blood pressure monitor will be placed directly against the skin of your upper arm at the level of your heart. It should be measured at least twice using the same arm. Certain conditions can cause a difference in blood pressure between your right and left arms. Certain factors can cause blood pressure readings to be lower or higher than normal for a short period of time:  When your blood pressure is higher when you  are in a health care provider's office than when you are at home, this is called white coat hypertension. Most people with this condition do not need medicines.  When your blood pressure is higher at home than when you are in a health care provider's office, this is called masked hypertension. Most people with  this condition may need medicines to control blood pressure. If you have a high blood pressure reading during one visit or you have normal blood pressure with other risk factors, you may be asked to:  Return on a different day to have your blood pressure checked again.  Monitor your blood pressure at home for 1 week or longer. If you are diagnosed with hypertension, you may have other blood or imaging tests to help your health care provider understand your overall risk for other conditions. How is this treated? This condition is treated by making healthy lifestyle changes, such as eating healthy foods, exercising more, and reducing your alcohol intake. Your health care provider may prescribe medicine if lifestyle changes are not enough to get your blood pressure under control, and if:  Your systolic blood pressure is above 130.  Your diastolic blood pressure is above 80. Your personal target blood pressure may vary depending on your medical conditions, your age, and other factors. Follow these instructions at home: Eating and drinking  Eat a diet that is high in fiber and potassium, and low in sodium, added sugar, and fat. An example eating plan is called the DASH (Dietary Approaches to Stop Hypertension) diet. To eat this way: ? Eat plenty of fresh fruits and vegetables. Try to fill one half of your plate at each meal with fruits and vegetables. ? Eat whole grains, such as whole-wheat pasta, brown rice, or whole-grain bread. Fill about one fourth of your plate with whole grains. ? Eat or drink low-fat dairy products, such as skim milk or low-fat yogurt. ? Avoid fatty cuts of meat,  processed or cured meats, and poultry with skin. Fill about one fourth of your plate with lean proteins, such as fish, chicken without skin, beans, eggs, or tofu. ? Avoid pre-made and processed foods. These tend to be higher in sodium, added sugar, and fat.  Reduce your daily sodium intake. Most people with hypertension should eat less than 1,500 mg of sodium a day.  Do not drink alcohol if: ? Your health care provider tells you not to drink. ? You are pregnant, may be pregnant, or are planning to become pregnant.  If you drink alcohol: ? Limit how much you use to:  0-1 drink a day for women.  0-2 drinks a day for men. ? Be aware of how much alcohol is in your drink. In the [place] one drink equals one 12 oz bottle of beer (355 mL), one 5 oz glass of wine (148 mL), or one 1 oz glass of hard liquor (44 mL).   Lifestyle  Work with your health care provider to maintain a healthy body weight or to lose weight. Ask what an ideal weight is for you.  Get at least 30 minutes of exercise most days of the week. Activities may include walking, swimming, or biking.  Include exercise to strengthen your muscles (resistance exercise), such as Pilates or lifting weights, as part of your weekly exercise routine. Try to do these types of exercises for 30 minutes at least 3 days a week.  Do not use any products that contain nicotine or tobacco, such as cigarettes, e-cigarettes, and chewing tobacco. If you need help quitting, ask your health care provider.  Monitor your blood pressure at home as told by your health care provider.  Keep all follow-up visits as told by your health care provider. This is important.   Medicines  Take over-the-counter and prescription medicines only as told by your health  care provider. Follow directions carefully. Blood pressure medicines must be taken as prescribed.  Do not skip doses of blood pressure medicine. Doing this puts you at risk for problems and can make the  medicine less effective.  Ask your health care provider about side effects or reactions to medicines that you should watch for. Contact a health care provider if you:  Think you are having a reaction to a medicine you are taking.  Have headaches that keep coming back (recurring).  Feel dizzy.  Have swelling in your ankles.  Have trouble with your vision. Get help right away if you:  Develop a severe headache or confusion.  Have unusual weakness or numbness.  Feel faint.  Have severe pain in your chest or abdomen.  Vomit repeatedly.  Have trouble breathing. Summary  Hypertension is when the force of blood pumping through your arteries is too strong. If this condition is not controlled, it may put you at risk for serious complications.  Your personal target blood pressure may vary depending on your medical conditions, your age, and other factors. For most people, a normal blood pressure is less than 120/80.  Hypertension is treated with lifestyle changes, medicines, or a combination of both. Lifestyle changes include losing weight, eating a healthy, low-sodium diet, exercising more, and limiting alcohol. This information is not intended to replace advice given to you by your health care provider. Make sure you discuss any questions you have with your health care provider. Document Revised: 04/12/2018 Document Reviewed: 04/12/2018 Elsevier Patient Education  2021 Elsevier Inc.      Agustina Caroli, MD [facility]

## 2020-08-28 NOTE — Patient Instructions (Addendum)
If you have lab work done today you will be contacted with your lab results within the next 2 weeks.  If you have not heard from [place] then please contact us. The fastest way to get your results is to register for My Chart.   IF you received an x-ray today, you will receive an invoice from [facility]. Please contact [facility] at [phone] with questions or concerns regarding your invoice.   IF you received labwork today, you will receive an invoice from [place]. Please contact [facility] at [phone] with questions or concerns regarding your invoice.   Our billing staff will not be able to assist you with questions regarding bills from these companies.  You will be contacted with the lab results as soon as they are available. The fastest way to get your results is to activate your My Chart account. Instructions are located on the last page of this paperwork. If you have not heard from [place] regarding the results in 2 weeks, please contact this office.     Health Maintenance, Male Adopting a healthy lifestyle and getting preventive care are important in promoting health and wellness. Ask your health care provider about:  The right schedule for you to have regular tests and exams.  Things you can do on your own to prevent diseases and keep yourself healthy. What should I know about diet, weight, and exercise? Eat a healthy diet  Eat a diet that includes plenty of vegetables, fruits, low-fat dairy products, and lean protein.  Do not eat a lot of foods that are high in solid fats, added sugars, or sodium.   Maintain a healthy weight Body mass index (BMI) is a measurement that can be used to identify possible weight problems. It estimates body fat based on height and weight. Your health care provider can help determine your BMI and help you achieve or maintain a healthy weight. Get regular exercise Get regular exercise. This is one of the most important things you  can do for your health. Most adults should:  Exercise for at least 150 minutes each week. The exercise should increase your heart rate and make you sweat (moderate-intensity exercise).  Do strengthening exercises at least twice a week. This is in addition to the moderate-intensity exercise.  Spend less time sitting. Even light physical activity can be beneficial. Watch cholesterol and blood lipids Have your blood tested for lipids and cholesterol at 60 years of age, then have this test every 5 years. You may need to have your cholesterol levels checked more often if:  Your lipid or cholesterol levels are high.  You are older than 60 years of age.  You are at high risk for heart disease. What should I know about cancer screening? Many types of cancers can be detected early and may often be prevented. Depending on your health history and family history, you may need to have cancer screening at various ages. This may include screening for:  Colorectal cancer.  Prostate cancer.  Skin cancer.  Lung cancer. What should I know about heart disease, diabetes, and high blood pressure? Blood pressure and heart disease  High blood pressure causes heart disease and increases the risk of stroke. This is more likely to develop in people who have high blood pressure readings, are of African descent, or are overweight.  Talk with your health care provider about your target blood pressure readings.  Have your blood pressure checked: ? Every 3-5 years if you are 18-39  years of age. ? Every year if you are 66 years old or older.  If you are between the ages of 63 and 110 and are a current or former smoker, ask your health care provider if you should have a one-time screening for abdominal aortic aneurysm (AAA). Diabetes Have regular diabetes screenings. This checks your fasting blood sugar level. Have the screening done:  Once every three years after age 73 if you are at a normal weight and have  a low risk for diabetes.  More often and at a younger age if you are overweight or have a high risk for diabetes. What should I know about preventing infection? Hepatitis B If you have a higher risk for hepatitis B, you should be screened for this virus. Talk with your health care provider to find out if you are at risk for hepatitis B infection. Hepatitis C Blood testing is recommended for:  Everyone born from [birth year] through 1965.  Anyone with known risk factors for hepatitis C. Sexually transmitted infections (STIs)  You should be screened each year for STIs, including gonorrhea and chlamydia, if: ? You are sexually active and are younger than 60 years of age. ? You are older than 60 years of age and your health care provider tells you that you are at risk for this type of infection. ? Your sexual activity has changed since you were last screened, and you are at increased risk for chlamydia or gonorrhea. Ask your health care provider if you are at risk.  Ask your health care provider about whether you are at high risk for HIV. Your health care provider may recommend a prescription medicine to help prevent HIV infection. If you choose to take medicine to prevent HIV, you should first get tested for HIV. You should then be tested every 3 months for as long as you are taking the medicine. Follow these instructions at home: Lifestyle  Do not use any products that contain nicotine or tobacco, such as cigarettes, e-cigarettes, and chewing tobacco. If you need help quitting, ask your health care provider.  Do not use street drugs.  Do not share needles.  Ask your health care provider for help if you need support or information about quitting drugs. Alcohol use  Do not drink alcohol if your health care provider tells you not to drink.  If you drink alcohol: ? Limit how much you have to 0-2 drinks a day. ? Be aware of how much alcohol is in your drink. In the [place] one drink equals one 12  oz bottle of beer (355 mL), one 5 oz glass of wine (148 mL), or one 1 oz glass of hard liquor (44 mL). General instructions  Schedule regular health, dental, and eye exams.  Stay current with your vaccines.  Tell your health care provider if: ? You often feel depressed. ? You have ever been abused or do not feel safe at home. Summary  Adopting a healthy lifestyle and getting preventive care are important in promoting health and wellness.  Follow your health care provider's instructions about healthy diet, exercising, and getting tested or screened for diseases.  Follow your health care provider's instructions on monitoring your cholesterol and blood pressure. This information is not intended to replace advice given to you by your health care provider. Make sure you discuss any questions you have with your health care provider. Document Revised: 07/26/2018 Document Reviewed: 07/26/2018 Elsevier Patient Education  2021 [employer].  Hypertension, Adult High blood pressure (  is when the force of blood pumping through the arteries is too strong. The arteries are the blood vessels that carry blood from the heart throughout the body. Hypertension forces the heart to work harder to pump blood and may cause arteries to become narrow or stiff. Untreated or uncontrolled hypertension can cause a heart attack, heart failure, a stroke, kidney disease, and other problems. A blood pressure reading consists of a higher number over a lower number. Ideally, your blood pressure should be below 120/80. The first ("top") number is called the systolic pressure. It is a measure of the pressure in your arteries as your heart beats. The second ("bottom") number is called the diastolic pressure. It is a measure of the pressure in your arteries as the heart relaxes. What are the causes? The exact cause of this condition is not known. There are some conditions that result in or are related to high blood  pressure. What increases the risk? Some risk factors for high blood pressure are under your control. The following factors may make you more likely to develop this condition:  Smoking.  Having type 2 diabetes mellitus, high cholesterol, or both.  Not getting enough exercise or physical activity.  Being overweight.  Having too much fat, sugar, calories, or salt (sodium) in your diet.  Drinking too much alcohol. Some risk factors for high blood pressure may be difficult or impossible to change. Some of these factors include:  Having chronic kidney disease.  Having a family history of high blood pressure.  Age. Risk increases with age.  Race. You may be at higher risk if you are African American.  Gender. Men are at higher risk than women before age 45. After age 65, women are at higher risk than men.  Having obstructive sleep apnea.  Stress. What are the signs or symptoms? High blood pressure may not cause symptoms. Very high blood pressure (hypertensive crisis) may cause:  Headache.  Anxiety.  Shortness of breath.  Nosebleed.  Nausea and vomiting.  Vision changes.  Severe chest pain.  Seizures. How is this diagnosed? This condition is diagnosed by measuring your blood pressure while you are seated, with your arm resting on a flat surface, your legs uncrossed, and your feet flat on the floor. The cuff of the blood pressure monitor will be placed directly against the skin of your upper arm at the level of your heart. It should be measured at least twice using the same arm. Certain conditions can cause a difference in blood pressure between your right and left arms. Certain factors can cause blood pressure readings to be lower or higher than normal for a short period of time:  When your blood pressure is higher when you are in a health care provider's office than when you are at home, this is called white coat hypertension. Most people with this condition do not need  medicines.  When your blood pressure is higher at home than when you are in a health care provider's office, this is called masked hypertension. Most people with this condition may need medicines to control blood pressure. If you have a high blood pressure reading during one visit or you have normal blood pressure with other risk factors, you may be asked to:  Return on a different day to have your blood pressure checked again.  Monitor your blood pressure at home for 1 week or longer. If you are diagnosed with hypertension, you may have other blood or imaging tests to help your   your health care provider understand your overall risk for other conditions. How is this treated? This condition is treated by making healthy lifestyle changes, such as eating healthy foods, exercising more, and reducing your alcohol intake. Your health care provider may prescribe medicine if lifestyle changes are not enough to get your blood pressure under control, and if:  Your systolic blood pressure is above 130.  Your diastolic blood pressure is above 80. Your personal target blood pressure may vary depending on your medical conditions, your age, and other factors. Follow these instructions at home: Eating and drinking  Eat a diet that is high in fiber and potassium, and low in sodium, added sugar, and fat. An example eating plan is called the DASH (Dietary Approaches to Stop Hypertension) diet. To eat this way: ? Eat plenty of fresh fruits and vegetables. Try to fill one half of your plate at each meal with fruits and vegetables. ? Eat whole grains, such as whole-wheat pasta, brown rice, or whole-grain bread. Fill about one fourth of your plate with whole grains. ? Eat or drink low-fat dairy products, such as skim milk or low-fat yogurt. ? Avoid fatty cuts of meat, processed or cured meats, and poultry with skin. Fill about one fourth of your plate with lean proteins, such as fish, chicken without skin, beans, eggs,  or tofu. ? Avoid pre-made and processed foods. These tend to be higher in sodium, added sugar, and fat.  Reduce your daily sodium intake. Most people with hypertension should eat less than 1,500 mg of sodium a day.  Do not drink alcohol if: ? Your health care provider tells you not to drink. ? You are pregnant, may be pregnant, or are planning to become pregnant.  If you drink alcohol: ? Limit how much you use to:  0-1 drink a day for women.  0-2 drinks a day for men. ? Be aware of how much alcohol is in your drink. In the [place] one drink equals one 12 oz bottle of beer (355 mL), one 5 oz glass of wine (148 mL), or one 1 oz glass of hard liquor (44 mL).   Lifestyle  Work with your health care provider to maintain a healthy body weight or to lose weight. Ask what an ideal weight is for you.  Get at least 30 minutes of exercise most days of the week. Activities may include walking, swimming, or biking.  Include exercise to strengthen your muscles (resistance exercise), such as Pilates or lifting weights, as part of your weekly exercise routine. Try to do these types of exercises for 30 minutes at least 3 days a week.  Do not use any products that contain nicotine or tobacco, such as cigarettes, e-cigarettes, and chewing tobacco. If you need help quitting, ask your health care provider.  Monitor your blood pressure at home as told by your health care provider.  Keep all follow-up visits as told by your health care provider. This is important.   Medicines  Take over-the-counter and prescription medicines only as told by your health care provider. Follow directions carefully. Blood pressure medicines must be taken as prescribed.  Do not skip doses of blood pressure medicine. Doing this puts you at risk for problems and can make the medicine less effective.  Ask your health care provider about side effects or reactions to medicines that you should watch for. Contact a health care  provider if you:  Think you are having a reaction to a medicine you are taking.  Have headaches that keep coming back (recurring).  Feel dizzy.  Have swelling in your ankles.  Have trouble with your vision. Get help right away if you:  Develop a severe headache or confusion.  Have unusual weakness or numbness.  Feel faint.  Have severe pain in your chest or abdomen.  Vomit repeatedly.  Have trouble breathing. Summary  Hypertension is when the force of blood pumping through your arteries is too strong. If this condition is not controlled, it may put you at risk for serious complications.  Your personal target blood pressure may vary depending on your medical conditions, your age, and other factors. For most people, a normal blood pressure is less than 120/80.  Hypertension is treated with lifestyle changes, medicines, or a combination of both. Lifestyle changes include losing weight, eating a healthy, low-sodium diet, exercising more, and limiting alcohol. This information is not intended to replace advice given to you by your health care provider. Make sure you discuss any questions you have with your health care provider. Document Revised: 04/12/2018 Document Reviewed: 04/12/2018 Elsevier Patient Education  2021 [employer].

## 2020-09-12 ENCOUNTER — Other Ambulatory Visit: Payer: Self-pay | Admitting: Emergency Medicine

## 2020-09-12 DIAGNOSIS — J189 Pneumonia, unspecified organism: Secondary | ICD-10-CM

## 2020-09-13 ENCOUNTER — Other Ambulatory Visit: Payer: Self-pay | Admitting: Emergency Medicine

## 2020-09-14 NOTE — Telephone Encounter (Signed)
Requested Prescriptions  Pending Prescriptions Disp Refills  . traZODone (DESYREL) 100 MG tablet [Pharmacy Med Name: TRAZODONE 100 MG TABLET] 90 tablet 1    Sig: TAKE 1 TABLET BY MOUTH EVERYDAY AT BEDTIME     Psychiatry: Antidepressants - Serotonin Modulator Passed - 09/13/2020  1:03 PM      Passed - Valid encounter within last 6 months    Recent Outpatient Visits          2 weeks ago Hypertension associated with diabetes [facility])   [facility] [place], [place], MD   2 months ago Pneumonia of right lower lobe due to infectious organism   [facility] at [place], [place], MD   2 months ago Pneumonia of right lower lobe due to infectious organism   [facility] at [place], [place], MD   3 months ago Benign paroxysmal positional vertigo due to bilateral vestibular disorder   [facility] at [place], [place], MD   3 months ago Benign paroxysmal positional vertigo due to bilateral vestibular disorder   Primary Care at [facility], Eilleen Kempf, MD

## 2020-09-17 ENCOUNTER — Other Ambulatory Visit: Payer: Self-pay | Admitting: Emergency Medicine

## 2020-09-17 DIAGNOSIS — M5440 Lumbago with sciatica, unspecified side: Secondary | ICD-10-CM

## 2020-09-17 DIAGNOSIS — G8929 Other chronic pain: Secondary | ICD-10-CM

## 2020-09-19 ENCOUNTER — Other Ambulatory Visit: Payer: Self-pay

## 2020-09-19 ENCOUNTER — Telehealth (INDEPENDENT_AMBULATORY_CARE_PROVIDER_SITE_OTHER): Payer: BC Managed Care – PPO | Admitting: Family Medicine

## 2020-09-19 ENCOUNTER — Encounter: Payer: Self-pay | Admitting: Family Medicine

## 2020-09-19 DIAGNOSIS — Z8701 Personal history of pneumonia (recurrent): Secondary | ICD-10-CM

## 2020-09-19 DIAGNOSIS — R059 Cough, unspecified: Secondary | ICD-10-CM

## 2020-09-19 DIAGNOSIS — R0600 Dyspnea, unspecified: Secondary | ICD-10-CM

## 2020-09-19 NOTE — Progress Notes (Signed)
Patient ID: Johnny Andrews, male    DOB: March 15, 1961  Age: 60 y.o. MRN: [id number]  Chief Complaint  Patient presents with  . Pneumonia    Pt reports feeling like he is not getting a full breath and that is whats troubling him most at this time pt reports SOB moving from one room in the house to the next. Pt declines cough, fever denies chest pain. Pt dx with pneumonia in November and is still having this lasting side effect.     Subjective:   Telephone visit.  32 minutes.  Patient understands the limitations of a phone visit and the possible need of being seen in person.  He is in a secure place for talking.  He appropriately identified himself.  I spoke to him from the office here.  Hence the billing process.  He had pneumonia back in November.  Never got Covid testing for acute disease.  The pneumonia gradually resolved.  He has persisted with a cough and shortness of breath.  He got worse again in early January.  The cough continues to persist.  He is short of breath on exertion if he just walks 50 yards into the office building.  He quit smoking 20 years ago.  He had a little pleural scarring visible on chest x-ray a decade ago.  He does not have a pulse oximeter but plans to get 1.  He is on CPAP at night.  He is using humidified oxygen right now which helped some.  He had a lot of nasal phlegm and saw an ENT about that.  Current allergies, medications, problem list, past/family and social histories reviewed.  Objective:  There were no vitals taken for this visit.  Unable to examine since this is a telephone visit.  Assessment & Plan:   Assessment: 1. Cough   2. History of pneumonia   3. Dyspnea, unspecified type       Plan: Good conversation and he understands the situation and where we are headed.  Orders Placed This Encounter  Procedures  . DG Chest 2 View    Standing Status:   Future    Standing Expiration Date:   09/19/2021    Order Specific Question:   Reason for Exam  (SYMPTOM  OR DIAGNOSIS REQUIRED)    Answer:   history pneumonia 3 mo ago with persisting dyspnea on exertion and couth    Order Specific Question:   Preferred imaging location?    Answer:   [facility]    Order Specific Question:   Call Results- Best Contact Number?    AnswerAlwyn Ren cellular [phone]    Order Specific Question:   Release to patient    Answer:   Immediate    No orders of the defined types were placed in this encounter.        Patient Instructions   Get a pulse oximeter and monitor your oxygen level per the levels we discussed.  Less than 90% is of significant concern, 90-94 is borderline, and above 94 is normal.  Get the chest x-ray  Drink plenty of fluids  Depending on the other studies, I think you probably should see a pulmonologist.  It was a doctor at [facility] who saw you about 8 years ago.  Follow-up with Dr. Alvy Bimler as needed.  When he moves to the [facility] practice you should be able to follow him there also.  If you have lab work done today you will be contacted with  your lab results within the next 2 weeks.  If you have not heard from [place] then please contact us. The fastest way to get your results is to register for My Chart.   IF you received an x-ray today, you will receive an invoice from [facility]. Please contact [facility] at [phone] with questions or concerns regarding your invoice.   IF you received labwork today, you will receive an invoice from [place]. Please contact [facility] at [phone] with questions or concerns regarding your invoice.   Our billing staff will not be able to assist you with questions regarding bills from these companies.  You will be contacted with the lab results as soon as they are available. The fastest way to get your results is to activate your My Chart account. Instructions are located on the last page of this paperwork. If you have not heard from [place] regarding  the results in 2 weeks, please contact this office.        Return if symptoms worsen or fail to improve.   Janace Hoard, MD 09/19/2020

## 2020-09-19 NOTE — Patient Instructions (Addendum)
Get a pulse oximeter and monitor your oxygen level per the levels we discussed.  Less than 90% is of significant concern, 90-94 is borderline, and above 94 is normal.  Get the chest x-ray  Drink plenty of fluids  Depending on the other studies, I think you probably should see a pulmonologist.  It was a doctor at [facility] who saw you about 8 years ago.  Follow-up with Dr. Alvy Bimler as needed.  When he moves to the [facility] practice you should be able to follow him there also.  If you have lab work done today you will be contacted with your lab results within the next 2 weeks.  If you have not heard from [place] then please contact us. The fastest way to get your results is to register for My Chart.   IF you received an x-ray today, you will receive an invoice from [facility]. Please contact [facility] at [phone] with questions or concerns regarding your invoice.   IF you received labwork today, you will receive an invoice from [place]. Please contact [facility] at [phone] with questions or concerns regarding your invoice.   Our billing staff will not be able to assist you with questions regarding bills from these companies.  You will be contacted with the lab results as soon as they are available. The fastest way to get your results is to activate your My Chart account. Instructions are located on the last page of this paperwork. If you have not heard from [place] regarding the results in 2 weeks, please contact this office.

## 2020-09-30 ENCOUNTER — Telehealth: Payer: Self-pay | Admitting: Pulmonary Disease

## 2020-09-30 NOTE — Telephone Encounter (Signed)
Called and spoke with patient who states that he needs a new cpap machine. Advised patient that he has not been seen since 2016 and that we would not be able to assist him unless he was seen for an office visit. Advised him that once he is not seen for 3 years he is considered a new patient. Patient expressed understanding and is now scheduled with Dr. Wynona Neat on 10/22/20. Nothing further needed at this time.

## 2020-10-06 ENCOUNTER — Telehealth: Payer: Self-pay

## 2020-10-06 NOTE — Telephone Encounter (Signed)
Not familiar.

## 2020-10-06 NOTE — Telephone Encounter (Signed)
Are you familiar or willing to write CPAP order for pt?

## 2020-10-06 NOTE — Telephone Encounter (Signed)
Pt. Called requesting CPAP script sent to [facility], fax [phone]. Pt. States his CPAP broke a week ago. PT. Stated he does not have a pulmonologist to represcribe one and wont be seen until the 9th of next month. Pt. Phone number [phone]

## 2020-10-08 ENCOUNTER — Telehealth: Payer: BC Managed Care – PPO | Admitting: Emergency Medicine

## 2020-10-08 ENCOUNTER — Telehealth: Payer: Self-pay | Admitting: *Deleted

## 2020-10-08 ENCOUNTER — Other Ambulatory Visit: Payer: Self-pay

## 2020-10-08 NOTE — Telephone Encounter (Signed)
Patient was seen at [facility] years  ago and is not sure if they wrote original script and C-pap broke and he cant sleep without it ! Patient is upset .   Patient has scheduled an appt with Pulmonary , patient cant wait that long .  schediled patient an virtual appt to discuss

## 2020-10-08 NOTE — Telephone Encounter (Signed)
Patient has an appt scheduled for today to discuss a new order for a cpap machine

## 2020-10-08 NOTE — Telephone Encounter (Signed)
Faxed completed prescription form to [facility] for Auto CPAP machine package. Confirmation at 12:23 pm.

## 2020-10-08 NOTE — Telephone Encounter (Signed)
Got it!     Thanks =).

## 2020-10-08 NOTE — Telephone Encounter (Signed)
Spoke to patient about the Rx for Auto CPAP machine, it was faxed this morning to [facility].The patient does not have to keep his video appt today.

## 2020-10-09 ENCOUNTER — Other Ambulatory Visit: Payer: Self-pay | Admitting: Emergency Medicine

## 2020-10-09 DIAGNOSIS — G8929 Other chronic pain: Secondary | ICD-10-CM

## 2020-10-09 DIAGNOSIS — M5440 Lumbago with sciatica, unspecified side: Secondary | ICD-10-CM

## 2020-10-22 ENCOUNTER — Encounter: Payer: Self-pay | Admitting: Pulmonary Disease

## 2020-10-22 ENCOUNTER — Other Ambulatory Visit: Payer: Self-pay

## 2020-10-22 ENCOUNTER — Ambulatory Visit: Payer: BC Managed Care – PPO | Admitting: Pulmonary Disease

## 2020-10-22 VITALS — BP 152/88 | HR 88 | Temp 98.5°F | Ht 75.0 in | Wt >= 6400 oz

## 2020-10-22 DIAGNOSIS — R0602 Shortness of breath: Secondary | ICD-10-CM | POA: Diagnosis not present

## 2020-10-22 DIAGNOSIS — G4733 Obstructive sleep apnea (adult) (pediatric): Secondary | ICD-10-CM

## 2020-10-22 MED ORDER — TRELEGY ELLIPTA 100-62.5-25 MCG/INH IN AEPB: 1.0000 | INHALATION_SPRAY | Freq: Every day | RESPIRATORY_TRACT | 3 refills | Status: DC

## 2020-10-22 NOTE — Progress Notes (Signed)
Johnny Andrews    [id number]    [birth year]  Primary Care Physician:Sagardia, Ines Bloomer, MD  Referring Physician: Horald Pollen, MD [place],  [place] [address]  Chief complaint:   Patient with a history of obstructive sleep apnea Shortness of breath with activity  HPI:  Shortness of breath with activity  It was able to buy a machine which he is using currently, he does need some help to make sure it set up correctly  Shortness of breath with activity as painful about 2 years  He continues to gain weight Does have a lot of pain and discomfort in his thighs with ambulation  Past history of bronchiectasis Reformed smoker  He has hypertension, diabetes, history of sleep apnea  He has had pneumonias, other lower respiratory tract infections which coincided when he felt his shortness of breath for worsening Has also gained over 60 pounds recently with diminished ability to exercise on a regular basis  Outpatient Encounter Medications as of 10/22/2020  Medication Sig  . albuterol (VENTOLIN HFA) 108 (90 Base) MCG/ACT inhaler TAKE 2 PUFFS BY MOUTH TWICE A DAY  . ALPRAZolam (XANAX) 0.5 MG tablet TAKE 1 TABLET BY MOUTH EVERY DAY AS NEEDED FOR ANXIETY  . blood glucose meter kit and supplies 1 each by Other route 2 (two) times daily.  . diclofenac (VOLTAREN) 75 MG EC tablet TAKE 1 TABLET BY MOUTH TWICE A DAY  . empagliflozin (JARDIANCE) 25 MG TABS tablet Take 25 mg by mouth daily.  Marland Kitchen glucose blood (ONE TOUCH ULTRA TEST) test strip Two times a day for ULTRA 2 ONE TOUCH  . insulin aspart (NOVOLOG) 100 UNIT/ML FlexPen Inject 75 Units into the skin 2 (two) times daily before a meal. 30 units am and 50 units pm  . Insulin Pen Needle (BD ULTRA-FINE PEN NEEDLES) 29G X 12.7MM MISC USE TWO CHECK BLOOD SUGAR TWICE A DAY. APPOINTMENT NEEDED FOR FURTHER REFILLS  . losartan-hydrochlorothiazide (HYZAAR) 100-12.5 MG tablet Take 1 tablet by mouth daily.  . meclizine  (ANTIVERT) 50 MG tablet Take 1 tablet (50 mg total) by mouth 3 (three) times daily as needed.  . metFORMIN (GLUCOPHAGE-XR) 500 MG 24 hr tablet Take 1,000 mg by mouth 2 (two) times daily.   Marland Kitchen Respiratory Therapy Supplies (ADULT MASK) MISC 2 Devices by Does not apply route once.  . simvastatin (ZOCOR) 20 MG tablet Take 20 mg by mouth daily.  Marland Kitchen topiramate (TOPAMAX) 50 MG tablet TAKE 2 TABLETS (100 MG TOTAL) BY MOUTH AT BEDTIME.  . traZODone (DESYREL) 100 MG tablet TAKE 1 TABLET BY MOUTH EVERYDAY AT BEDTIME  . TRESIBA FLEXTOUCH 200 UNIT/ML SOPN INJECT 100 UNITS TWICE A DAY  . gabapentin (NEURONTIN) 300 MG capsule Take 300 mg by mouth daily.  Marland Kitchen levothyroxine (SYNTHROID) 137 MCG tablet Take 2 tablets (274 mcg total) by mouth daily before breakfast.   No facility-administered encounter medications on file as of 10/22/2020.    Allergies as of 10/22/2020 - Review Complete 10/22/2020  Allergen Reaction Noted  . Other  06/02/2020    Past Medical History:  Diagnosis Date  . ALCOHOL USE 10/24/2008  . ANXIETY 09/19/2007  . Cough 05/22/2008  . Diabetes mellitus without complication ([place])    Phreesia 06/02/2020  . DIABETES MELLITUS, TYPE II 09/19/2007  . External thrombosed hemorrhoids 08/23/2008  . GOUT 04/17/2010  . HYPERLIPIDEMIA 09/19/2007  . HYPERTENSION 09/19/2007  . Hypertension    [employer]reesia 06/02/2020  . HYPOTHYROIDISM 09/19/2007  .  Leg pain   . OSTEOARTHRITIS 09/19/2007  . OTHER DISEASES OF LUNG NOT ELSEWHERE CLASSIFIED 07/20/2010  . Rash and other nonspecific skin eruption 09/19/2007  . Restrictive lung disease   . SCROTAL ABSCESS 05/27/2010  . Sebaceous cyst 08/01/2008  . SLEEP APNEA 08/22/2009  . Sleep apnea    [employer]reesia 06/02/2020  . SMOKER 04/17/2010  . Thyroid disease    [employer]reesia 06/02/2020    Past Surgical History:  Procedure Laterality Date  . COLONOSCOPY    . NASAL SEPTUM SURGERY      Family History  Problem Relation Age of Onset  . Breast cancer Mother        Breast Cancer  .  Parkinson's disease Mother   . Heart disease Father        CABG, stenting cardiac  . Cancer Brother 76       prostate cancer  . Lung disease Neg Hx   . Autoimmune disease Neg Hx     Social History   Socioeconomic History  . Marital status: Married    Spouse name: Not on file  . Number of children: 2  . Years of education: BS  . Highest education level: Not on file  Occupational History  . Occupation: Multimedia programmer: [employer]  Tobacco Use  . Smoking status: Former Smoker    Packs/day: 0.50    Years: 30.00    Pack years: 15.00    Quit date: 2008    Years since quitting: 14.1  . Smokeless tobacco: Never Used  . Tobacco comment: 30 years at most off and on smoking 03/14/15  Substance and Sexual Activity  . Alcohol use: Yes    Alcohol/week: 0.0 standard drinks    Comment: 2 drinks per day  . Drug use: Yes    Comment: Occasional Marijuana use  . Sexual activity: Not on file  Other Topics Concern  . Not on file  Social History Narrative   Originally from [place]. Has lived in [place], [place], [place], & [place] since 1993. 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 Currently has a dog & cats. Previously had a parrot (conure) 12 years ago for 1 year but in same house.  Has also had excessive exposure to pigeon feces. He has had very brief exposure to asbestos around 2000. No hot tub exposure. He has inhaled chemical fumes/gases/refridgerants through his work.      Marital status: married x 26 years      Children: 2 children (19, 34); no grandchildren      Lives: Lives at home with his wife      Employment: [employer]      Tobacco: none; quit in 2013; smoked x 1/2 ppd x 20 years      Alcohol:  10 drinks; usually 2 drinks per day and then skip two days.      Drugs:  None      Exercise: none; job is physically demanding      Right-handed.   1 cup caffeine daily.   Social Determinants of Health   Financial Resource  Strain: Not on file  Food Insecurity: Not on file  Transportation Needs: Not on file  Physical Activity: Not on file  Stress: Not on file  Social Connections: Not on file  Intimate Partner Violence: Not on file    Review of Systems  Constitutional: Positive for fatigue.  Respiratory: Positive for apnea  and shortness of breath.   Cardiovascular: Negative for chest pain.  Musculoskeletal: Positive for arthralgias and myalgias.    Vitals:   10/22/20 1206  BP: (!) 152/88  Pulse: 88  Temp: 98.5 F (36.9 C)  SpO2: 93%     Physical Exam Constitutional:      Appearance: He is obese.  HENT:     Head: Normocephalic.     Mouth/Throat:     Mouth: Mucous membranes are moist.     Comments: Crowded oropharynx, Mallampati 3 Eyes:     General:        Right eye: No discharge.        Left eye: No discharge.  Cardiovascular:     Rate and Rhythm: Normal rate and regular rhythm.     Heart sounds: No murmur heard. No friction rub.  Pulmonary:     Effort: No respiratory distress.     Breath sounds: No stridor. No wheezing or rhonchi.  Musculoskeletal:     Cervical back: No tenderness.  Neurological:     Mental Status: He is alert.  Psychiatric:        Mood and Affect: Mood normal.    Data Reviewed: Previous CT scan of the chest from 2016 reviewed showing bronchiectasis  Previous PFT shows small airway obstructive disease  Assessment:  Shortness of breath  Superobesity  Obstructive sleep apnea -Owns his own machine -Needs some assistance with setting of machine -Referral to DME company  Bronchiectasis on previous CT -Discussed possibility of obtaining a repeat CT- -he wants to hold off on this at present  Plan/Recommendations: Obtain PFT  Obtain echocardiogram  Weight loss efforts encouraged  Referral to DME  Follow-up in 3 months  I did stress the importance of exercise and diet for weight loss   Sherrilyn Rist MD [facility] 10/22/2020, 12:35 PM  CC: Horald Pollen, *

## 2020-10-22 NOTE — Patient Instructions (Signed)
Worsening shortness of breath -Trelegy -Albuterol as needed  Obstructive sleep apnea -Continue with your CPAP -Referral to DME to assist with set up  PFT Echocardiogram  Tentative follow-up in 3 months  Aggressive weight loss efforts-diet and exercise

## 2020-10-24 ENCOUNTER — Telehealth: Payer: Self-pay | Admitting: Pulmonary Disease

## 2020-10-24 NOTE — Telephone Encounter (Signed)
Spoke with the pt  She states that he has appt with DME next wk and they will be figuring out what settings he needs for his CPAP  They will call us to send orders for new settings once they establish these  Will close encounter bc nothing further needed today

## 2020-10-24 NOTE — Telephone Encounter (Signed)
Spoke with the  

## 2020-11-03 ENCOUNTER — Other Ambulatory Visit: Payer: Self-pay | Admitting: Emergency Medicine

## 2020-11-03 DIAGNOSIS — G8929 Other chronic pain: Secondary | ICD-10-CM

## 2020-11-03 DIAGNOSIS — M5440 Lumbago with sciatica, unspecified side: Secondary | ICD-10-CM

## 2020-11-27 ENCOUNTER — Ambulatory Visit (HOSPITAL_COMMUNITY)
Admission: RE | Admit: 2020-11-27 | Discharge: 2020-11-27 | Disposition: A | Discharge status: Home / Self Care | Payer: BC Managed Care – PPO | Source: Ambulatory Visit | Attending: Pulmonary Disease | Admitting: Pulmonary Disease

## 2020-11-27 ENCOUNTER — Other Ambulatory Visit: Payer: Self-pay

## 2020-11-27 DIAGNOSIS — I10 Essential (primary) hypertension: Secondary | ICD-10-CM | POA: Diagnosis not present

## 2020-11-27 DIAGNOSIS — R0600 Dyspnea, unspecified: Secondary | ICD-10-CM | POA: Insufficient documentation

## 2020-11-27 DIAGNOSIS — R0602 Shortness of breath: Secondary | ICD-10-CM | POA: Insufficient documentation

## 2020-11-27 DIAGNOSIS — G4730 Sleep apnea, unspecified: Secondary | ICD-10-CM | POA: Diagnosis not present

## 2020-11-27 DIAGNOSIS — E119 Type 2 diabetes mellitus without complications: Secondary | ICD-10-CM | POA: Insufficient documentation

## 2020-11-27 MED ORDER — PERFLUTREN LIPID MICROSPHERE
1.0000 mL | INTRAVENOUS | Status: AC | PRN
  Administered 2020-11-28: 5 mL via INTRAVENOUS
  Filled 2020-11-27: qty 10

## 2020-11-28 LAB — ECHOCARDIOGRAM COMPLETE
AR max vel: 8.78 cm2
AV Area VTI: 7.71 cm2
AV Area mean vel: 8.07 cm2
AV Mean grad: 4 mmHg
AV Peak grad: 6.2 mmHg
Ao pk vel: 1.24 m/s
Area-P 1/2: 4.71 cm2
MV VTI: 5.52 cm2

## 2020-12-07 IMAGING — DX DG CHEST 2V
4 series · 4 of 4 positions shown · non-contrast
Comparison: 07/08/2020.  12/30/2017.  CT 03/25/2015.

CLINICAL DATA: Pneumonia follow-up.

EXAM:
CHEST - 2 VIEW

[chest pa (1 of 2)]
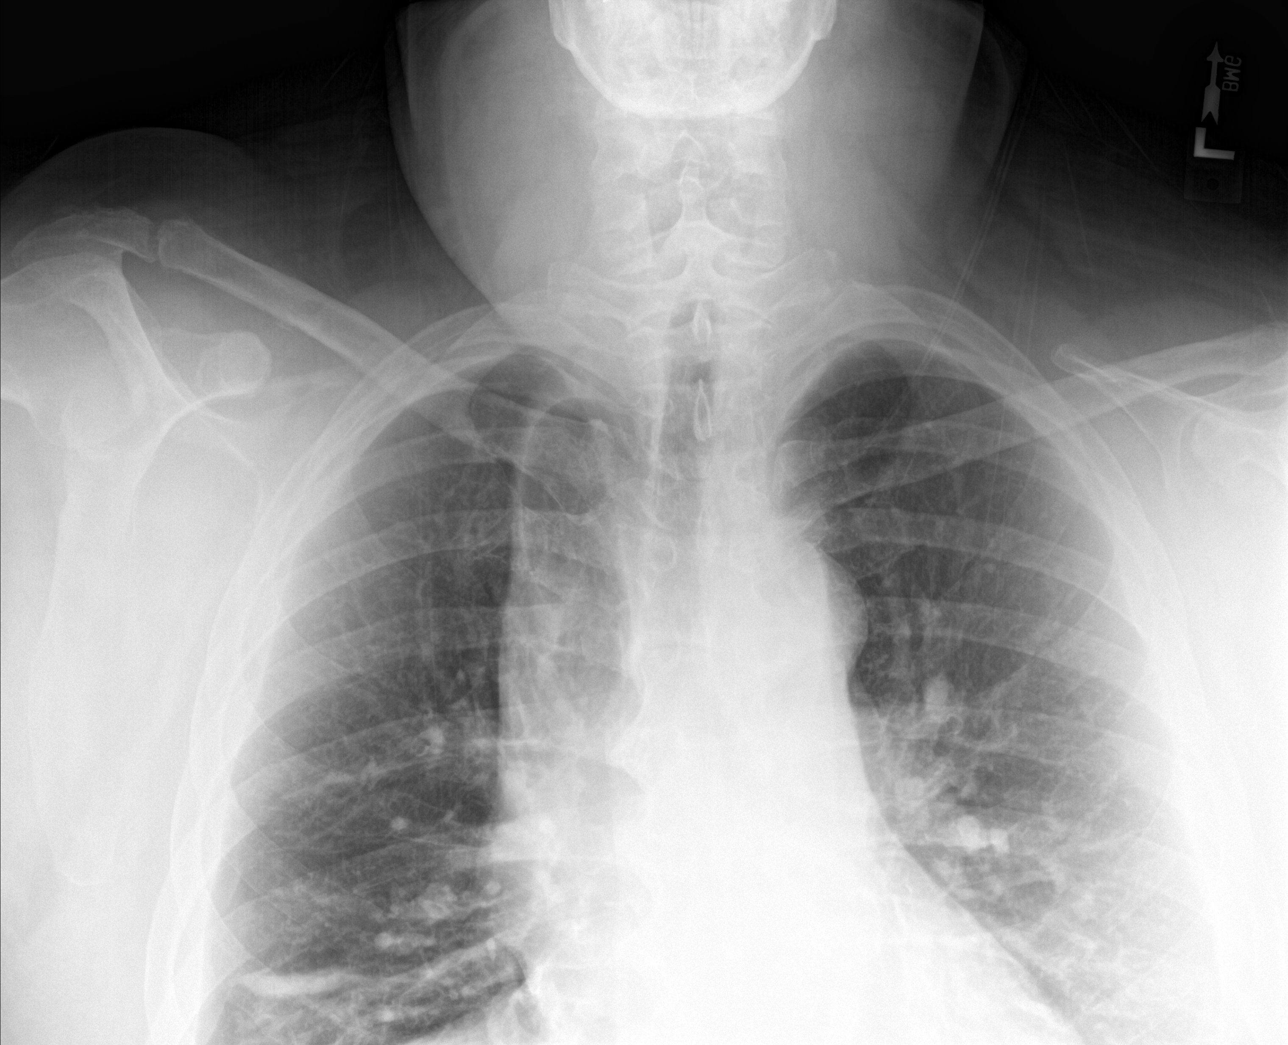

[chest lat (1 of 2)]
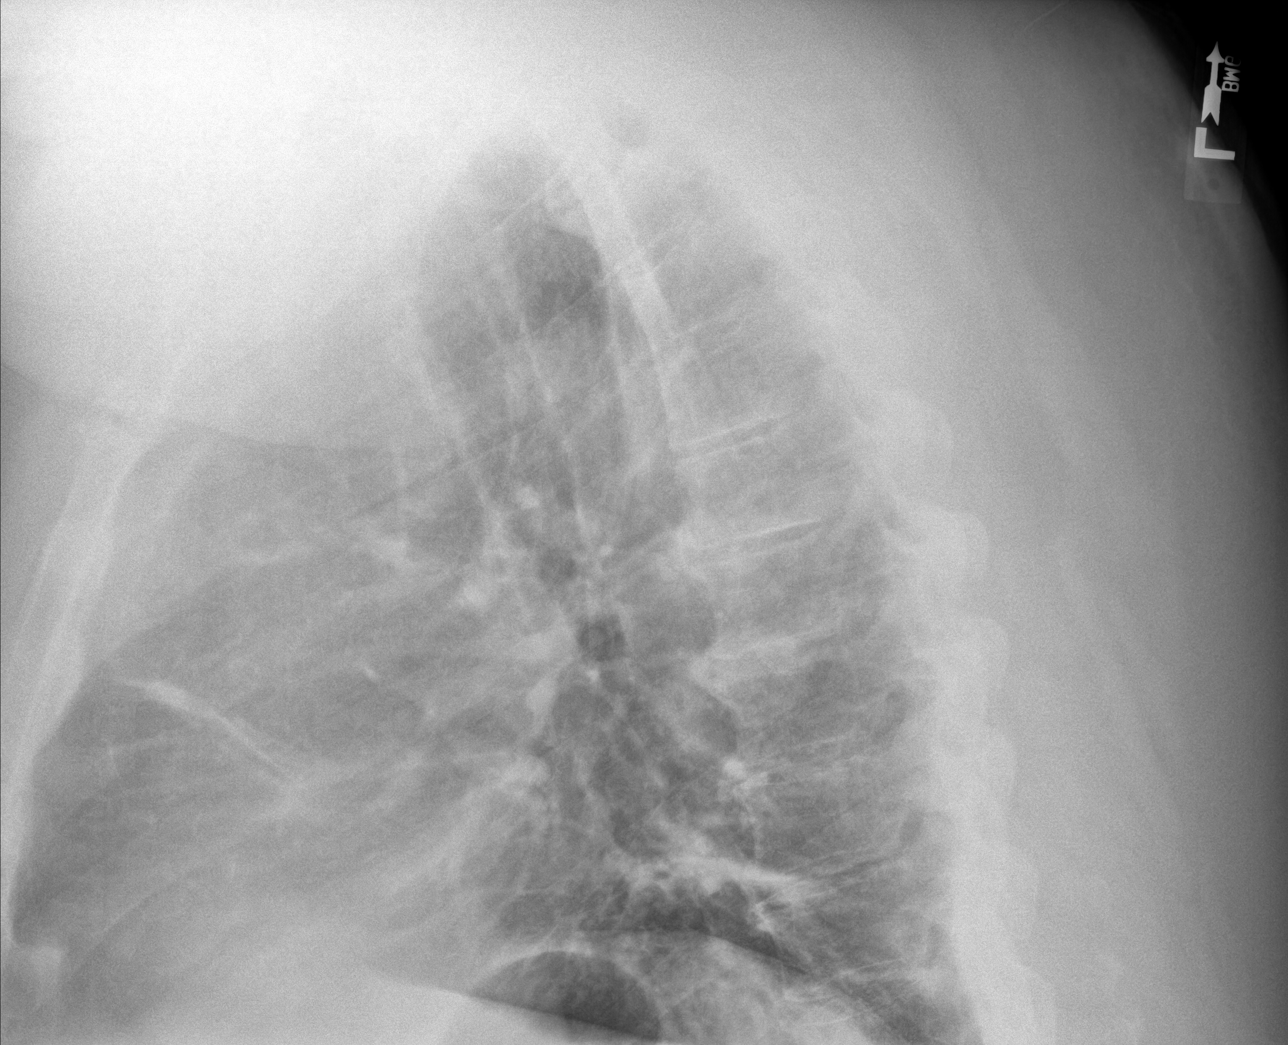

[chest pa (2 of 2)]
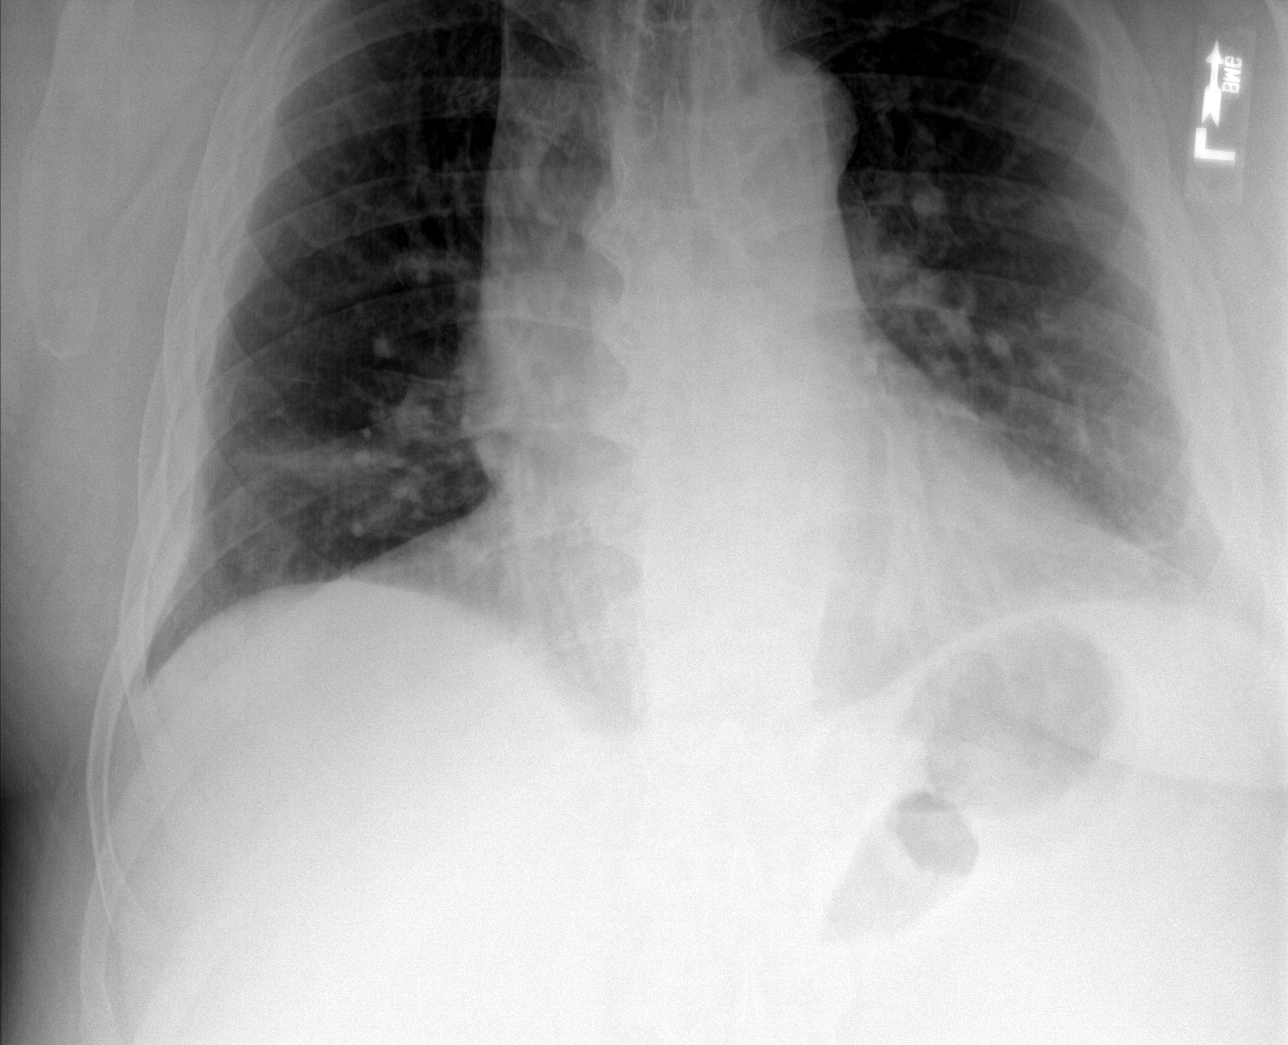

[chest lat (2 of 2)]
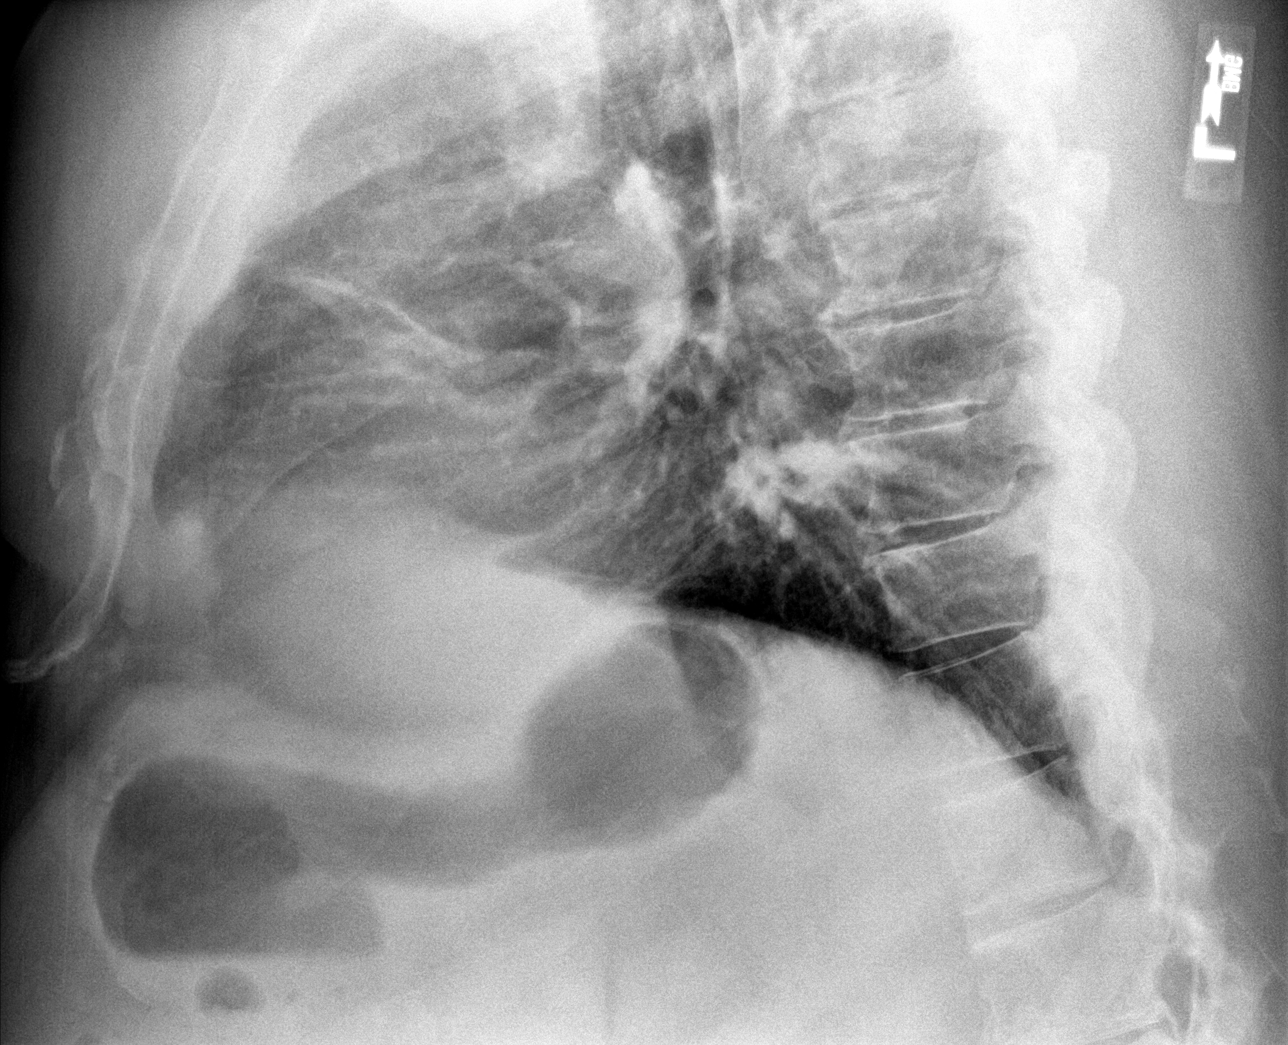

[4 of 4 positions shown; findings below may reference images not displayed]

FINDINGS: Prominent azygos vein again noted. Interval near complete clearing
of right base infiltrate. Persistent right upper and right base
pleural-parenchymal thickening consistent with scarring. Stable
cardiomegaly. No pulmonary venous congestion. No pneumothorax.
IMPRESSION: Interval near complete clearing of right base infiltrate. Persistent
right upper and right base pleural-parenchymal scarring.

## 2020-12-12 ENCOUNTER — Telehealth: Payer: Self-pay | Admitting: Pulmonary Disease

## 2020-12-12 NOTE — Telephone Encounter (Signed)
Called and spoke with patient advised him of results/recommendations per Dr. Wynona Neat.  Scheduled for covid test on 12/19/20 at 12 pm, PFT 12/22/20 at 12 pm and f/u with TP at 2 pm.  He verbalized understanding.  Nothing further needed.

## 2020-12-12 NOTE — Telephone Encounter (Signed)
Echocardiogram is relatively normal  They did not get optimal windows because of his weight.   Schedule him for a follow-up appointment during which we can try and qualify him for oxygen Schedule him for pulmonary function test as well  Bottom-line- weight needs to come off, not suggesting its easy

## 2020-12-12 NOTE — Telephone Encounter (Signed)
Called and spoke with patient who states he is calling because he needs for someone to go over echocardiogram results. Also states that the endo doctor wanted him to let Dr. Wynona Neat know that after walking O2level drops to 81 % & his heart rate went up but he recovers after 30 second to about 91-92%.   Dr. Darcella Cheshire advise

## 2020-12-18 ENCOUNTER — Ambulatory Visit: Payer: BC Managed Care – PPO | Admitting: Pulmonary Disease

## 2020-12-19 ENCOUNTER — Other Ambulatory Visit (HOSPITAL_COMMUNITY)
Admission: RE | Admit: 2020-12-19 | Discharge: 2020-12-19 | Disposition: A | Discharge status: Home / Self Care | Payer: BC Managed Care – PPO | Source: Ambulatory Visit | Attending: Pulmonary Disease | Admitting: Pulmonary Disease

## 2020-12-19 DIAGNOSIS — Z20822 Contact with and (suspected) exposure to covid-19: Secondary | ICD-10-CM | POA: Diagnosis not present

## 2020-12-19 DIAGNOSIS — Z01812 Encounter for preprocedural laboratory examination: Secondary | ICD-10-CM | POA: Insufficient documentation

## 2020-12-19 LAB — SARS CORONAVIRUS 2 (TAT 6-24 HRS): SARS Coronavirus 2: NEGATIVE

## 2020-12-22 ENCOUNTER — Encounter: Payer: Self-pay | Admitting: Adult Health

## 2020-12-22 ENCOUNTER — Ambulatory Visit: Payer: BC Managed Care – PPO | Admitting: Adult Health

## 2020-12-22 ENCOUNTER — Ambulatory Visit (INDEPENDENT_AMBULATORY_CARE_PROVIDER_SITE_OTHER): Payer: BC Managed Care – PPO | Admitting: Pulmonary Disease

## 2020-12-22 ENCOUNTER — Other Ambulatory Visit: Payer: Self-pay

## 2020-12-22 DIAGNOSIS — J984 Other disorders of lung: Secondary | ICD-10-CM

## 2020-12-22 DIAGNOSIS — G4730 Sleep apnea, unspecified: Secondary | ICD-10-CM

## 2020-12-22 DIAGNOSIS — R0609 Other forms of dyspnea: Secondary | ICD-10-CM

## 2020-12-22 DIAGNOSIS — R0600 Dyspnea, unspecified: Secondary | ICD-10-CM

## 2020-12-22 DIAGNOSIS — R0602 Shortness of breath: Secondary | ICD-10-CM

## 2020-12-22 LAB — PULMONARY FUNCTION TEST
DL/VA % pred: 125 %
DL/VA: 5.2 ml/min/mmHg/L
DLCO cor % pred: 91 %
DLCO cor: 30.26 ml/min/mmHg
DLCO unc % pred: 91 %
DLCO unc: 30.26 ml/min/mmHg
FEF 25-75 Post: 3.82 L/sec
FEF 25-75 Pre: 3.35 L/sec
FEF2575-%Change-Post: 14 %
FEF2575-%Pred-Post: 105 %
FEF2575-%Pred-Pre: 92 %
FEV1-%Change-Post: 3 %
FEV1-%Pred-Post: 65 %
FEV1-%Pred-Pre: 63 %
FEV1-Post: 2.93 L
FEV1-Pre: 2.84 L
FEV1FVC-%Change-Post: 0 %
FEV1FVC-%Pred-Pre: 111 %
FEV6-%Change-Post: 3 %
FEV6-%Pred-Post: 62 %
FEV6-%Pred-Pre: 59 %
FEV6-Post: 3.48 L
FEV6-Pre: 3.35 L
FEV6FVC-%Change-Post: 0 %
FEV6FVC-%Pred-Post: 104 %
FEV6FVC-%Pred-Pre: 104 %
FVC-%Change-Post: 3 %
FVC-%Pred-Post: 59 %
FVC-%Pred-Pre: 57 %
FVC-Post: 3.49 L
FVC-Pre: 3.35 L
Post FEV1/FVC ratio: 84 %
Post FEV6/FVC ratio: 100 %
Pre FEV1/FVC ratio: 85 %
Pre FEV6/FVC Ratio: 100 %
RV % pred: 49 %
RV: 1.25 L
TLC % pred: 59 %
TLC: 4.88 L

## 2020-12-22 NOTE — Assessment & Plan Note (Signed)
Restrictive lung disease -May have a component of mixed disease He has had clinical improvement on Trelegy inhaler for now we will continue on Trelegy.  Activity as tolerated.  And weight loss.  Plan  Patient Instructions  Continue on TRELEGY 1 puff daily  Robitussin or Mucinex Twice daily  As needed  Cough/congestion  Healthy weight loss.  Zyrtec 10mg  daily As needed   Continue on CPAP At bedtime   Bring SD card by for download .  Follow up with Dr. in 4 months and As needed

## 2020-12-22 NOTE — Progress Notes (Signed)
PFT done today. 

## 2020-12-22 NOTE — Progress Notes (Signed)
_0  ID: Johnny Andrews, male    DOB: March 22, 1961, 60 y.o.   MRN: [id number]  Chief Complaint  Patient presents with  . Follow-up    Referring provider: Horald Pollen, *  HPI: 60 year old male former smoker seen for pulmonary consult October 22, 2020 for shortness of breath and to establish for sleep apnea Medical history significant for DM , Morbid obesity   TEST/EVENTS :  CT chest August 2016 mild bilateral lower lobe bronchiectasis with associated scarring and subsegmental atelectasis.  Mild scarring in the right upper lobe.  12/22/2020 Follow up ; OSA and Dyspnea  Patient returns for a 66-monthfollow-up.  Patient was seen last visit for a pulmonary consult.  Patient has underlying sleep apnea is on nocturnal CPAP. Has had CPAP for >135yr.   Recently go a refurbished CPAP from local DME- [facility] .   Patient wears CPAP each night.  Gets in about 9 hours.  Patient feels rested with no significant daytime sleepiness. CPAP download was requested. Does not have a SD card.  Says AHI each day 0-3/hr .  Currently on CPAP 16cmH2O.  Says he can not sleep without it, Never misses  A night .  Discussed getting SD card for downloads .    Patient also complained of shortness of breath with activities over the last couple years which seems to be getting worse.  Does have a history of some recurrent pneumonias and frequent bronchitis.  Patient's also had ongoing weight gain.  Current weight at 434 pounds, BMI 52.  Patient was set up for pulmonary function testing which was completed today that showed moderate restriction, no airflow obstruction.  With with an FEV1 at 65%, ratio 84, FVC 59%, no significant bronchodilator response.,  DLCO was 91%. Chest x-ray November 2021 showed clearance of right basilar infiltrate.  And persistent right upper and right base parenchymal scarring. He was set up for 2D echo completed on November 27, 2020 that showed EF at 55 to 60%, right ventricular systolic  function normal.  RV size is mildly enlarged. Retired from [employer].  Concerned for possible occupational expsoures.  Started on TRELEGY , does notice breathing is some better since last visit .  Feels breathing got worse after he had Pneumonia in October 2021, Covid booster 06/2020 . Since then breathing has been getting worse.  Does feel TRELEGY has helped some .  We discussed a walk test in the office.  Patient declined says he checks his oxygen at home and drops down to 88 to 90% only briefly and recovers very quickly.  He does not want to begin oxygen at this time. Is trying to lose weight , personal trainer  And water aerobics .    Allergies  Allergen Reactions  . Other     Dust mites    Immunization History  Administered Date(s) Administered  . Influenza Split 06/23/2012  . Influenza, Quadrivalent, Recombinant, Inj, Pf 06/21/2019  . Influenza,inj,Quad PF,6+ Mos 05/31/2016  . Pneumococcal Polysaccharide-23 12/13/2011  . Td 10/24/2008    Past Medical History:  Diagnosis Date  . ALCOHOL USE 10/24/2008  . ANXIETY 09/19/2007  . Cough 05/22/2008  . Diabetes mellitus without complication ([facility]   Phreesia 06/02/2020  . DIABETES MELLITUS, TYPE II 09/19/2007  . External thrombosed hemorrhoids 08/23/2008  . GOUT 04/17/2010  . HYPERLIPIDEMIA 09/19/2007  . HYPERTENSION 09/19/2007  . Hypertension    Phreesia 06/02/2020  . HYPOTHYROIDISM 09/19/2007  . Leg pain   . OSTEOARTHRITIS 09/19/2007  .  OTHER DISEASES OF LUNG NOT ELSEWHERE CLASSIFIED 07/20/2010  . Rash and other nonspecific skin eruption 09/19/2007  . Restrictive lung disease   . SCROTAL ABSCESS 05/27/2010  . Sebaceous cyst 08/01/2008  . SLEEP APNEA 08/22/2009  . Sleep apnea    Phreesia 06/02/2020  . SMOKER 04/17/2010  . Thyroid disease    Phreesia 06/02/2020    Tobacco History: Social History   Tobacco Use  Smoking Status Former Smoker  . Packs/day: 0.50  . Years: 30.00  . Pack years: 15.00  . Quit date: 2008  . Years since  quitting: 14.3  Smokeless Tobacco Never Used  Tobacco Comment   30 years at most off and on smoking 03/14/15   Counseling given: Not Answered Comment: 30 years at most off and on smoking 03/14/15   Outpatient Medications Prior to Visit  Medication Sig Dispense Refill  . ALPRAZolam (XANAX) 0.5 MG tablet TAKE 1 TABLET BY MOUTH EVERY DAY AS NEEDED FOR ANXIETY 30 tablet 1  . blood glucose meter kit and supplies 1 each by Other route 2 (two) times daily. 1 each 1  . diclofenac (VOLTAREN) 75 MG EC tablet TAKE 1 TABLET BY MOUTH TWICE A DAY 180 tablet 1  . empagliflozin (JARDIANCE) 25 MG TABS tablet Take 25 mg by mouth daily.    . Fluticasone-Umeclidin-Vilant (TRELEGY ELLIPTA) 100-62.5-25 MCG/INH AEPB Inhale 1 puff into the lungs daily. 60 each 3  . gabapentin (NEURONTIN) 300 MG capsule Take 300 mg by mouth daily.    Marland Kitchen glucose blood (ONE TOUCH ULTRA TEST) test strip Two times a day for ULTRA 2 ONE TOUCH 100 each 1  . insulin aspart (NOVOLOG) 100 UNIT/ML FlexPen Inject 75 Units into the skin 2 (two) times daily before a meal. 30 units am and 50 units pm    . Insulin Pen Needle (BD ULTRA-FINE PEN NEEDLES) 29G X 12.7MM MISC USE TWO CHECK BLOOD SUGAR TWICE A DAY. APPOINTMENT NEEDED FOR FURTHER REFILLS 100 each 3  . losartan-hydrochlorothiazide (HYZAAR) 100-12.5 MG tablet Take 1 tablet by mouth daily. 90 tablet 3  . meclizine (ANTIVERT) 50 MG tablet Take 1 tablet (50 mg total) by mouth 3 (three) times daily as needed. 30 tablet 1  . metFORMIN (GLUCOPHAGE-XR) 500 MG 24 hr tablet Take 1,000 mg by mouth 2 (two) times daily.     Marland Kitchen Respiratory Therapy Supplies (ADULT MASK) MISC 2 Devices by Does not apply route once. 2 each 0  . simvastatin (ZOCOR) 20 MG tablet Take 20 mg by mouth daily.    Marland Kitchen topiramate (TOPAMAX) 50 MG tablet TAKE 2 TABLETS (100 MG TOTAL) BY MOUTH AT BEDTIME. 180 tablet 1  . traZODone (DESYREL) 100 MG tablet TAKE 1 TABLET BY MOUTH EVERYDAY AT BEDTIME 90 tablet 1  . TRESIBA FLEXTOUCH 200  UNIT/ML SOPN INJECT 100 UNITS TWICE A DAY  6  . albuterol (VENTOLIN HFA) 108 (90 Base) MCG/ACT inhaler TAKE 2 PUFFS BY MOUTH TWICE A DAY (Patient not taking: Reported on 12/22/2020) 6.7 each 1  . levothyroxine (SYNTHROID) 137 MCG tablet Take 2 tablets (274 mcg total) by mouth daily before breakfast. 180 tablet 3   No facility-administered medications prior to visit.     Review of Systems:   Constitutional:   No  weight loss, night sweats,  Fevers, chills,  +fatigue, or  lassitude.  HEENT:   No headaches,  Difficulty swallowing,  Tooth/dental problems, or  Sore throat,  No sneezing, itching, ear ache, nasal congestion, post nasal drip,   CV:  No chest pain,  Orthopnea, PND, swelling in lower extremities, anasarca, dizziness, palpitations, syncope.   GI  No heartburn, indigestion, abdominal pain, nausea, vomiting, diarrhea, change in bowel habits, loss of appetite, bloody stools.   Resp:    No chest wall deformity  Skin: no rash or lesions.  GU: no dysuria, change in color of urine, no urgency or frequency.  No flank pain, no hematuria   MS:  No joint pain or swelling.  No decreased range of motion.  No back pain.    Physical Exam  BP 132/86 (BP Location: Left Arm, Patient Position: Sitting, Cuff Size: Normal)   Pulse 85   Temp 98.6 F (37 C) (Temporal)   Ht _0  (1.93 m)   Wt (!) 434 lb (196.9 kg)   SpO2 93% Comment: RA  BMI 52.83 kg/m   GEN: A/Ox3; pleasant , NAD, BMI 52   HEENT:  [place]/AT,  NOSE-clear, THROAT-clear, no lesions, no postnasal drip or exudate noted.   NECK:  Supple w/ fair ROM; no JVD; normal carotid impulses w/o bruits; no thyromegaly or nodules palpated; no lymphadenopathy.    RESP  Clear  P & A; w/o, wheezes/ rales/ or rhonchi. no accessory muscle use, no dullness to percussion  CARD:  RRR, no m/r/g, 1+  peripheral edema, pulses intact, no cyanosis or clubbing.  GI:   Soft & nt; nml bowel sounds; no organomegaly or masses detected.    Musco: Warm bil, no deformities or joint swelling noted.   Neuro: alert, no focal deficits noted.    Skin: Warm, no lesions or rashes    Lab Results:   BNP No results found for: BNP  Imaging: ECHOCARDIOGRAM COMPLETE  Result Date: 11/28/2020    ECHOCARDIOGRAM REPORT   Patient Name:   Johnny Andrews Date of Exam: 11/27/2020 Medical Rec #:  [id number]         Height:       75.0 in Accession #:    [id number]        Weight:       440.0 lb Date of Birth:  [birth year]         BSA:          3.068 m Patient Age:    31 years          BP:           155/70 mmHg Patient Gender: M                 HR:           97 bpm. Exam Location:  Inpatient Procedure: 2D Echo, Cardiac Doppler, Color Doppler and Intracardiac            Opacification Agent Indications:    Dyspnea  History:        Patient has no prior history of Echocardiogram examinations.                 Risk Factors:Hypertension, Diabetes and Sleep Apnea.  Sonographer:    Clayton Lefort RDCS (AE) Referring Phys: [id number] Laurin Coder  Sonographer Comments: Technically difficult study due to poor echo windows, suboptimal parasternal window, suboptimal apical window, suboptimal subcostal window and patient is morbidly obese. Image acquisition challenging due to patient body habitus. IMPRESSIONS  1. Left ventricular ejection fraction, by estimation, is 55 to 60%. The left ventricle has normal function. Left ventricular endocardial border not optimally  defined to evaluate regional wall motion. Left ventricular diastolic parameters are indeterminate.  2. Right ventricular systolic function is normal. The right ventricular size is mildly enlarged.  3. The mitral valve is grossly normal. No evidence of mitral valve regurgitation.  4. The aortic valve was not well visualized. Aortic valve regurgitation is not visualized.  5. Study is otherwise nondiagnostic due to image quality. FINDINGS  Left Ventricle: Left ventricular ejection fraction, by estimation, is 55 to  60%. The left ventricle has normal function. Left ventricular endocardial border not optimally defined to evaluate regional wall motion. Definity contrast agent was given IV to delineate the left ventricular endocardial borders. The left ventricular internal cavity size was normal in size. Left ventricular diastolic parameters are indeterminate. Right Ventricle: The right ventricular size is mildly enlarged. Right vetricular wall thickness was not well visualized. Right ventricular systolic function is normal. Left Atrium: Left atrial size was normal in size. Right Atrium: Right atrial size was normal in size. Pericardium: There is no evidence of pericardial effusion. Presence of pericardial fat pad. Mitral Valve: The mitral valve is grossly normal. No evidence of mitral valve regurgitation. MV peak gradient, 4.9 mmHg. The mean mitral valve gradient is 3.0 mmHg. Tricuspid Valve: The tricuspid valve is not well visualized. Tricuspid valve regurgitation is trivial. Aortic Valve: The aortic valve was not well visualized. Aortic valve regurgitation is not visualized. Aortic valve mean gradient measures 4.0 mmHg. Aortic valve peak gradient measures 6.2 mmHg. Aortic valve area, by VTI measures 7.71 cm. Pulmonic Valve: The pulmonic valve was not well visualized. Aorta: The aortic root was not well visualized and the ascending aorta was not well visualized. Venous: The inferior vena cava was not well visualized. IAS/Shunts: The interatrial septum was not well visualized.  LEFT VENTRICLE PLAX 2D LVOT diam:     3.55 cm  Diastology LV SV:         178      LV e' medial:    6.85 cm/s LV SV Index:   58       LV E/e' medial:  12.3 LVOT Area:     9.90 cm LV e' lateral:   8.49 cm/s                         LV E/e' lateral: 9.9  RIGHT VENTRICLE RV S prime:     15.65 cm/s TAPSE (M-mode): 2.7 cm LEFT ATRIUM           Index       RIGHT ATRIUM           Index LA Vol (A2C): 64.5 ml 21.02 ml/m RA Area:     18.10 cm LA Vol (A4C): 76.1 ml  24.81 ml/m RA Volume:   47.00 ml  15.32 ml/m  AORTIC VALVE AV Area (Vmax):    8.78 cm AV Area (Vmean):   8.07 cm AV Area (VTI):     7.71 cm AV Vmax:           124.00 cm/s AV Vmean:          91.200 cm/s AV VTI:            0.231 m AV Peak Grad:      6.2 mmHg AV Mean Grad:      4.0 mmHg LVOT Vmax:         110.00 cm/s LVOT Vmean:        74.400 cm/s LVOT VTI:  0.180 m LVOT/AV VTI ratio: 0.78 MITRAL VALVE MV Area (PHT): 4.71 cm    SHUNTS MV Area VTI:   5.52 cm    Systemic VTI:  0.18 m MV Peak grad:  4.9 mmHg    Systemic Diam: 3.55 cm MV Mean grad:  3.0 mmHg MV Vmax:       1.11 m/s MV Vmean:      74.3 cm/s MV Decel Time: 161 msec MV E velocity: 84.40 cm/s MV A velocity: 72.40 cm/s MV E/A ratio:  1.17 Cherlynn Kaiser MD Electronically signed by Cherlynn Kaiser MD Signature Date/Time: 11/28/2020/6:24:55 PM    Final       PFT Results Latest Ref Rng & Units 12/22/2020 05/05/2015  FVC-Pre L 3.35 3.82  FVC-Predicted Pre % 57 63  FVC-Post L 3.49 3.81  FVC-Predicted Post % 59 63  Pre FEV1/FVC % % 85 84  Post FEV1/FCV % % 84 87  FEV1-Pre L 2.84 3.22  FEV1-Predicted Pre % 63 70  FEV1-Post L 2.93 3.32  DLCO uncorrected ml/min/mmHg 30.26 36.39  DLCO UNC% % 91 90  DLCO corrected ml/min/mmHg 30.26 -  DLCO COR %Predicted % 91 -  DLVA Predicted % 125 117  TLC L 4.88 6.29  TLC % Predicted % 59 76  RV % Predicted % 49 91    No results found for: NITRICOXIDE      Assessment & Plan:   Sleep apnea Obstructive sleep apnea.  Continue on CPAP.  Patient is to obtain a SD card and bring back by the office once he has been on it for 30 days.  Plan  Patient Instructions  Continue on TRELEGY 1 puff daily  Robitussin or Mucinex Twice daily  As needed  Cough/congestion  Healthy weight loss.  Zyrtec 49m daily As needed   Continue on CPAP At bedtime   Bring SD card by for download .  Follow up with Dr. OAnder Sladein 4 months and As needed        Restrictive lung disease Restrictive lung disease  -May have a component of mixed disease He has had clinical improvement on Trelegy inhaler for now we will continue on Trelegy.  Activity as tolerated.  And weight loss.  Plan  Patient Instructions  Continue on TRELEGY 1 puff daily  Robitussin or Mucinex Twice daily  As needed  Cough/congestion  Healthy weight loss.  Zyrtec 179mdaily As needed   Continue on CPAP At bedtime   Bring SD card by for download .  Follow up with Dr. OlAnder Sladen 4 months and As needed        Morbid obesity ([facility]Healthy weight loss  Dyspnea Dyspnea suspect is multifactorial in nature.  2D echo showed preserved EF. Chest x-ray and November 2021 showed chronic scarring.   Pulmonary function testing today shows moderate restriction.  Normal diffusing capacity. He has had clinical benefit on Trelegy.  We will continue for now.  If symptoms continue consider a high-resolution CT chest to further evaluate lung parenchyma.  Plan  Patient Instructions  Continue on TRELEGY 1 puff daily  Robitussin or Mucinex Twice daily  As needed  Cough/congestion  Healthy weight loss.  Zyrtec 1039maily As needed   Continue on CPAP At bedtime   Bring SD card by for download .  Follow up with Dr. OlaAnder Slade 4 months and As needed             TamRexene EdisonP 12/22/2020

## 2020-12-22 NOTE — Assessment & Plan Note (Signed)
Dyspnea suspect is multifactorial in nature.  2D echo showed preserved EF. Chest x-ray and November 2021 showed chronic scarring.   Pulmonary function testing today shows moderate restriction.  Normal diffusing capacity. He has had clinical benefit on Trelegy.  We will continue for now.  If symptoms continue consider a high-resolution CT chest to further evaluate lung parenchyma.  Plan  Patient Instructions  Continue on TRELEGY 1 puff daily  Robitussin or Mucinex Twice daily  As needed  Cough/congestion  Healthy weight loss.  Zyrtec 10mg  daily As needed   Continue on CPAP At bedtime   Bring SD card by for download .  Follow up with Dr. in 4 months and As needed

## 2020-12-22 NOTE — Patient Instructions (Signed)
Continue on TRELEGY 1 puff daily  Robitussin or Mucinex Twice daily  As needed  Cough/congestion  Healthy weight loss.  Zyrtec 10mg  daily As needed   Continue on CPAP At bedtime   Bring SD card by for download .  Follow up with Dr. in 4 months and As needed

## 2020-12-22 NOTE — Assessment & Plan Note (Signed)
Healthy weight loss 

## 2020-12-22 NOTE — Assessment & Plan Note (Signed)
Obstructive sleep apnea.  Continue on CPAP.  Patient is to obtain a SD card and bring back by the office once he has been on it for 30 days.  Plan  Patient Instructions  Continue on TRELEGY 1 puff daily  Robitussin or Mucinex Twice daily  As needed  Cough/congestion  Healthy weight loss.  Zyrtec 10mg  daily As needed   Continue on CPAP At bedtime   Bring SD card by for download .  Follow up with Dr. in 4 months and As needed

## 2021-01-31 ENCOUNTER — Other Ambulatory Visit: Payer: Self-pay | Admitting: Emergency Medicine

## 2021-01-31 DIAGNOSIS — G6289 Other specified polyneuropathies: Secondary | ICD-10-CM

## 2021-02-25 ENCOUNTER — Telehealth: Payer: Self-pay

## 2021-02-25 NOTE — Telephone Encounter (Signed)
Last RF per controlled substance database: 12/04/20. Last OV: 08/28/20 Next OV 03/02/21  Pt called b/c no 25mo f/u appt noted.  Appt made, pt aware.

## 2021-03-02 ENCOUNTER — Ambulatory Visit: Payer: BC Managed Care – PPO | Admitting: Emergency Medicine

## 2021-03-02 ENCOUNTER — Encounter: Payer: Self-pay | Admitting: Emergency Medicine

## 2021-03-02 ENCOUNTER — Other Ambulatory Visit: Payer: Self-pay

## 2021-03-02 VITALS — BP 138/70 | HR 85 | Temp 98.5°F | Ht 76.0 in | Wt >= 6400 oz

## 2021-03-02 DIAGNOSIS — Z1211 Encounter for screening for malignant neoplasm of colon: Secondary | ICD-10-CM

## 2021-03-02 DIAGNOSIS — F418 Other specified anxiety disorders: Secondary | ICD-10-CM

## 2021-03-02 DIAGNOSIS — J984 Other disorders of lung: Secondary | ICD-10-CM | POA: Diagnosis not present

## 2021-03-02 DIAGNOSIS — I152 Hypertension secondary to endocrine disorders: Secondary | ICD-10-CM | POA: Diagnosis not present

## 2021-03-02 DIAGNOSIS — E1159 Type 2 diabetes mellitus with other circulatory complications: Secondary | ICD-10-CM | POA: Diagnosis not present

## 2021-03-02 DIAGNOSIS — M159 Polyosteoarthritis, unspecified: Secondary | ICD-10-CM

## 2021-03-02 DIAGNOSIS — M8949 Other hypertrophic osteoarthropathy, multiple sites: Secondary | ICD-10-CM

## 2021-03-02 DIAGNOSIS — I739 Peripheral vascular disease, unspecified: Secondary | ICD-10-CM

## 2021-03-02 DIAGNOSIS — M48062 Spinal stenosis, lumbar region with neurogenic claudication: Secondary | ICD-10-CM

## 2021-03-02 DIAGNOSIS — E785 Hyperlipidemia, unspecified: Secondary | ICD-10-CM

## 2021-03-02 DIAGNOSIS — E034 Atrophy of thyroid (acquired): Secondary | ICD-10-CM

## 2021-03-02 DIAGNOSIS — E1142 Type 2 diabetes mellitus with diabetic polyneuropathy: Secondary | ICD-10-CM

## 2021-03-02 DIAGNOSIS — M150 Primary generalized (osteo)arthritis: Secondary | ICD-10-CM

## 2021-03-02 DIAGNOSIS — M5136 Other intervertebral disc degeneration, lumbar region: Secondary | ICD-10-CM

## 2021-03-02 DIAGNOSIS — M51369 Other intervertebral disc degeneration, lumbar region without mention of lumbar back pain or lower extremity pain: Secondary | ICD-10-CM

## 2021-03-02 DIAGNOSIS — E1169 Type 2 diabetes mellitus with other specified complication: Secondary | ICD-10-CM

## 2021-03-02 LAB — POCT GLYCOSYLATED HEMOGLOBIN (HGB A1C): Hemoglobin A1C: 7 % — AB (ref 4.0–5.6)

## 2021-03-02 MED ORDER — ALPRAZOLAM 0.5 MG PO TABS: 0.5000 mg | ORAL_TABLET | Freq: Two times a day (BID) | ORAL | 1 refills | Status: DC | PRN

## 2021-03-02 NOTE — Assessment & Plan Note (Signed)
Stable.  Takes Xanax sporadically and without abuse.

## 2021-03-02 NOTE — Assessment & Plan Note (Signed)
Chronic bilateral knee pain affecting quality of life.  Needs orthopedic follow-up.

## 2021-03-02 NOTE — Assessment & Plan Note (Signed)
Well-controlled.  Continue Trelegy Ellipta.  Albuterol as needed.

## 2021-03-02 NOTE — Assessment & Plan Note (Signed)
Diet and nutrition discussed. Continue simvastatin 20 mg daily. 

## 2021-03-02 NOTE — Assessment & Plan Note (Signed)
Well-controlled hypertension.  Continue losartan 100 mg daily. Well-controlled diabetes with hemoglobin A1c better than before at 7.0.  Continue Tresiba insulin and Jardiance.  Will consider Trulicity or Ozempic in the near future and reducing insulin doses to help with weight loss. Diet and nutrition discussed. Refer to weight management clinic.

## 2021-03-02 NOTE — Progress Notes (Signed)
Johnny Andrews 60 y.o.   Chief Complaint  Patient presents with   Hypertension    Follow up. Medication refill    HISTORY OF PRESENT ILLNESS: This is a 60 y.o. male here for follow-up of chronic medical problems and medication refill. #1 diabetes: On Tresiba insulin twice a day and Jardiance 25 mg daily.  Also on metformin 1000 mg twice a day.  Sees endocrinologist on a regular basis.  Has peripheral neuropathy on gabapentin. #2 hypertension: On losartan 100 mg #3 restrictive lung disease on Trelegy Ellipta daily. #4 chronic back and bilateral knee pains.  History of degenerative lumbar disc disease, spinal stenosis, and osteoarthritis. #5 morbid obesity #6 dyslipidemia on simvastatin 20 mg daily. Recent echocardiogram done last April pretty much normal. Pulmonary function test done last May shows restrictive lung disease.  Hypertension Pertinent negatives include no headaches or shortness of breath.    Prior to Admission medications   Medication Sig Start Date End Date Taking? Authorizing Provider  blood glucose meter kit and supplies 1 each by Other route 2 (two) times daily. 03/14/20  Yes Horald Pollen, MD  diclofenac (VOLTAREN) 75 MG EC tablet TAKE 1 TABLET BY MOUTH TWICE A DAY 11/03/20  Yes Coree Riester, Ines Bloomer, MD  empagliflozin (JARDIANCE) 25 MG TABS tablet Take 25 mg by mouth daily.   Yes [provider]  Fluticasone-Umeclidin-Vilant (TRELEGY ELLIPTA) 100-62.5-25 MCG/INH AEPB Inhale 1 puff into the lungs daily. 10/22/20  Yes Olalere, Adewale A, MD  gabapentin (NEURONTIN) 300 MG capsule Take 300 mg by mouth daily. 08/20/20  Yes [provider]  glucose blood (ONE TOUCH ULTRA TEST) test strip Two times a day for ULTRA 2 ONE TOUCH 02/28/20  Yes Shenia Alan, Ines Bloomer, MD  insulin aspart (NOVOLOG) 100 UNIT/ML FlexPen Inject 75 Units into the skin 2 (two) times daily before a meal. 30 units am and 50 units pm   Yes [provider]  Insulin Pen  Needle (BD ULTRA-FINE PEN NEEDLES) 29G X 12.7MM MISC USE TWO CHECK BLOOD SUGAR TWICE A DAY. APPOINTMENT NEEDED FOR FURTHER REFILLS 02/28/20  Yes Azarria Balint, Ines Bloomer, MD  losartan-hydrochlorothiazide [facility]) 100-12.5 MG tablet Take 1 tablet by mouth daily. 08/28/20  Yes Manasa Spease, Ines Bloomer, MD  metFORMIN (GLUCOPHAGE-XR) 500 MG 24 hr tablet Take 1,000 mg by mouth 2 (two) times daily.  09/15/15  Yes [provider]  Respiratory Therapy Supplies (ADULT MASK) MISC 2 Devices by Does not apply route once. 11/24/11  Yes Rigoberto Noel, MD  simvastatin (ZOCOR) 20 MG tablet Take 20 mg by mouth daily.   Yes [provider]  topiramate (TOPAMAX) 50 MG tablet TAKE 2 TABLETS BY MOUTH AT BEDTIME. 02/01/21  Yes Brissia Delisa, Ines Bloomer, MD  traZODone (DESYREL) 100 MG tablet TAKE 1 TABLET BY MOUTH EVERYDAY AT BEDTIME 02/01/21  Yes Charrise Lardner, [place], MD  TRESIBA FLEXTOUCH 200 UNIT/ML SOPN INJECT 100 UNITS TWICE A DAY 12/17/16  Yes [provider]  albuterol (VENTOLIN HFA) 108 (90 Base) MCG/ACT inhaler TAKE 2 PUFFS BY MOUTH TWICE A DAY Patient not taking: No sig reported 09/12/20   Horald Pollen, MD  ALPRAZolam Duanne Moron) 0.5 MG tablet Take 1 tablet (0.5 mg total) by mouth 2 (two) times daily as needed for anxiety. 03/02/21   Horald Pollen, MD  levothyroxine (SYNTHROID) 137 MCG tablet Take 2 tablets (274 mcg total) by mouth daily before breakfast. 03/04/20 06/09/20  Horald Pollen, MD  meclizine (ANTIVERT) 50 MG tablet Take 1 tablet (50 mg  total) by mouth 3 (three) times daily as needed. Patient not taking: Reported on 03/02/2021 06/09/20   Horald Pollen, MD    Allergies  Allergen Reactions   Other     Dust mites    Patient Active Problem List   Diagnosis Date Noted   Claudication [facility]) 07/08/2020   Chronic venous insufficiency 07/25/2019   Skin lesion of scalp 07/25/2019   Encounter for orthopedic follow-up care 08/14/2018   Carpal tunnel syndrome of right  wrist 04/24/2018   Diabetic peripheral neuropathy ([place]) 04/24/2018   Morbid obesity ([place]) 01/27/2018   Osteoarthritis of left knee 12/16/2017   Osteoarthritis of right knee 12/16/2017   Family history of prostate cancer 03/06/2017   Pure hypercholesterolemia 09/13/2016   Lumbar degenerative disc disease 07/22/2016   Spinal stenosis of lumbar region 11/03/2015   Abnormality of gait 09/15/2015   Restrictive lung disease 05/05/2015   Dyspnea 03/14/2015   Impotence of organic origin 02/05/2013   Hypogonadism male 02/05/2013   Elevated PSA 11/30/2011   OTHER DISEASES OF LUNG NOT ELSEWHERE CLASSIFIED 07/20/2010   GOUT 04/17/2010   SMOKER 04/17/2010   Sleep apnea 08/22/2009   Hypothyroidism 09/19/2007   Dyslipidemia associated with type 2 diabetes mellitus ([place]) 09/19/2007   Hypertension associated with diabetes ([place]) 09/19/2007   Osteoarthritis 09/19/2007    Past Medical History:  Diagnosis Date   ALCOHOL USE 10/24/2008   ANXIETY 09/19/2007   Cough 05/22/2008   Diabetes mellitus without complication ([place])    Phreesia 06/02/2020   DIABETES MELLITUS, TYPE II 09/19/2007   External thrombosed hemorrhoids 08/23/2008   GOUT 04/17/2010   HYPERLIPIDEMIA 09/19/2007   HYPERTENSION 09/19/2007   Hypertension    Phreesia 06/02/2020   HYPOTHYROIDISM 09/19/2007   Leg pain    OSTEOARTHRITIS 09/19/2007   OTHER DISEASES OF LUNG NOT ELSEWHERE CLASSIFIED 07/20/2010   Rash and other nonspecific skin eruption 09/19/2007   Restrictive lung disease    SCROTAL ABSCESS 05/27/2010   Sebaceous cyst 08/01/2008   SLEEP APNEA 08/22/2009   Sleep apnea    Phreesia 06/02/2020   SMOKER 04/17/2010   Thyroid disease    Phreesia 06/02/2020    Past Surgical History:  Procedure Laterality Date   COLONOSCOPY     NASAL SEPTUM SURGERY      Social History   Socioeconomic History   Marital status: Married    Spouse name: Not on file   Number of children: 2   Years of education: BS   Highest education level: Not on file   Occupational History   Occupation: Multimedia programmer: [employer]  Tobacco Use   Smoking status: Former    Packs/day: 0.50    Years: 30.00    Pack years: 15.00    Types: Cigarettes    Quit date: 2008    Years since quitting: 14.5   Smokeless tobacco: Never   Tobacco comments:    30 years at most off and on smoking 03/14/15  Substance and Sexual Activity   Alcohol use: Yes    Alcohol/week: 0.0 standard drinks    Comment: 2 drinks per day   Drug use: Yes    Comment: Occasional Marijuana use   Sexual activity: Not on file  Other Topics Concern   Not on file  Social History Narrative   Originally from [place]. Has lived in [place], [place], [place], & [place] since 1993. 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 Currently  has a dog & cats. Previously had a parrot (conure) 12 years ago for 1 year but in same house.  Has also had excessive exposure to pigeon feces. He has had very brief exposure to asbestos around 2000. No hot tub exposure. He has inhaled chemical fumes/gases/refridgerants through his work.      Marital status: married x 26 years      Children: 2 children (19, 40); no grandchildren      Lives: Lives at home with his wife      Employment: [employer]      Tobacco: none; quit in 2013; smoked x 1/2 ppd x 20 years      Alcohol:  10 drinks; usually 2 drinks per day and then skip two days.      Drugs:  None      Exercise: none; job is physically demanding      Right-handed.   1 cup caffeine daily.   Social Determinants of Health   Financial Resource Strain: Not on file  Food Insecurity: Not on file  Transportation Needs: Not on file  Physical Activity: Not on file  Stress: Not on file  Social Connections: Not on file  Intimate Partner Violence: Not on file    Family History  Problem Relation Age of Onset   Breast cancer Mother        Breast Cancer   Parkinson's disease Mother    Heart disease Father         CABG, stenting cardiac   Cancer Brother 61       prostate cancer   Lung disease Neg Hx    Autoimmune disease Neg Hx      Review of Systems  Constitutional: Negative.  Negative for chills and fever.  HENT: Negative.  Negative for congestion and sore throat.   Respiratory: Negative.  Negative for cough and shortness of breath.   Gastrointestinal: Negative.  Negative for abdominal pain, blood in stool, diarrhea, melena, nausea and vomiting.  Genitourinary: Negative.  Negative for dysuria and hematuria.  Musculoskeletal:  Positive for back pain and joint pain. Negative for myalgias.  Skin: Negative.  Negative for rash.  Neurological:  Negative for dizziness and headaches.  All other systems reviewed and are negative.  Today's Vitals   03/02/21 1405  BP: 138/70  Pulse: 85  Temp: 98.5 F (36.9 C)  TempSrc: Oral  SpO2: 95%  Weight: (!) 432 lb (196 kg)  Height: '6\' 4"'  (1.93 m)   Body mass index is 52.58 kg/m. Wt Readings from Last 3 Encounters:  03/02/21 (!) 432 lb (196 kg)  12/22/20 (!) 434 lb (196.9 kg)  10/22/20 (!) 440 lb (199.6 kg)    Physical Exam Vitals reviewed.  Constitutional:      Appearance: Normal appearance. He is obese.  HENT:     Head: Normocephalic.  Eyes:     Extraocular Movements: Extraocular movements intact.     Conjunctiva/sclera: Conjunctivae normal.     Pupils: Pupils are equal, round, and reactive to light.  Cardiovascular:     Rate and Rhythm: Normal rate and regular rhythm.     Pulses: Normal pulses.     Heart sounds: Normal heart sounds.  Pulmonary:     Effort: Pulmonary effort is normal.     Breath sounds: Normal breath sounds.  Musculoskeletal:        General: Normal range of motion.     Cervical back: Normal range of motion and neck supple.  Skin:  General: Skin is warm and dry.     Capillary Refill: Capillary refill takes less than 2 seconds.  Neurological:     General: No focal deficit present.     Mental Status: He is alert and  oriented to person, place, and time.  Psychiatric:        Mood and Affect: Mood normal.        Behavior: Behavior normal.   Results for orders placed or performed in visit on 03/02/21 (from the past 24 hour(s))  POCT glycosylated hemoglobin (Hb A1C)     Status: Abnormal   Collection Time: 03/02/21  2:24 PM  Result Value Ref Range   Hemoglobin A1C 7.0 (A) 4.0 - 5.6 %   HbA1c POC (<> result, manual entry)     HbA1c, POC (prediabetic range)     HbA1c, POC (controlled diabetic range)       ASSESSMENT & PLAN: A total of 30 minutes was spent with the patient and counseling/coordination of care regarding preparing for this visit, review of most recent office visit notes, review of most recent blood work results including today's hemoglobin A1c, review of all medications, health maintenance items, education on nutrition, cardiovascular risks associated with hypertension diabetes, review of most recent echocardiogram done last April, review of pulmonary function test studies done last May, prognosis, documentation, and need for follow-up.  Hypertension associated with diabetes ([place]) Well-controlled hypertension.  Continue losartan 100 mg daily. Well-controlled diabetes with hemoglobin A1c better than before at 7.0.  Continue Tresiba insulin and Jardiance.  Will consider Trulicity or Ozempic in the near future and reducing insulin doses to help with weight loss. Diet and nutrition discussed. Refer to weight management clinic.  Restrictive lung disease Well-controlled.  Continue Trelegy Ellipta.  Albuterol as needed.  Dyslipidemia associated with type 2 diabetes mellitus ([place]) Diet and nutrition discussed.  Continue simvastatin 20 mg daily.  Osteoarthritis Chronic bilateral knee pain affecting quality of life.  Needs orthopedic follow-up.  Situational anxiety Stable.  Takes Xanax sporadically and without abuse.  Ernst was seen today for hypertension.  Diagnoses and all orders for this  visit:  Hypertension associated with diabetes ([place]) -     POCT glycosylated hemoglobin (Hb A1C)  Situational anxiety -     Discontinue: ALPRAZolam (XANAX) 0.5 MG tablet; Take 1 tablet (0.5 mg total) by mouth 2 (two) times daily as needed for anxiety. -     ALPRAZolam (XANAX) 0.5 MG tablet; Take 1 tablet (0.5 mg total) by mouth 2 (two) times daily as needed for anxiety.  Morbid obesity ([place]) -     Amb Ref to Medical Weight Management  Restrictive lung disease  Diabetic peripheral neuropathy ([place])  Lumbar degenerative disc disease  Spinal stenosis of lumbar region with neurogenic claudication  Hypothyroidism due to acquired atrophy of thyroid  Dyslipidemia associated with type 2 diabetes mellitus ([place])  Claudication (HCC)  Primary osteoarthritis involving multiple joints  Patient Instructions  Diabetes Mellitus and Nutrition, Adult When you have diabetes, or diabetes mellitus, it is very important to have healthy eating habits because your blood sugar (glucose) levels are greatly affected by what you eat and drink. Eating healthy foods in the right amounts, at about the same times every day, can help you: Control your blood glucose. Lower your risk of heart disease. Improve your blood pressure. Reach or maintain a healthy weight. What can affect my meal plan? Every person with diabetes is different, and each person has different needs for a meal plan. Your  health care provider may recommend that you work with a dietitian to make a meal plan that is best for you. Your meal plan may vary depending on factors such as: The calories you need. The medicines you take. Your weight. Your blood glucose, blood pressure, and cholesterol levels. Your activity level. Other health conditions you have, such as heart or kidney disease. How do carbohydrates affect me? Carbohydrates, also called carbs, affect your blood glucose level more than any other type of food. Eating carbs naturally  raises the amount of glucose in your blood. Carb counting is a method for keeping track of how many carbs you eat. Counting carbs is important to keep your blood glucose at a healthy level,especially if you use insulin or take certain oral diabetes medicines. It is important to know how many carbs you can safely have in each meal. This is different for every person. Your dietitian can help you calculate how manycarbs you should have at each meal and for each snack. How does alcohol affect me? Alcohol can cause a sudden decrease in blood glucose (hypoglycemia), especially if you use insulin or take certain oral diabetes medicines. Hypoglycemia can be a life-threatening condition. Symptoms of hypoglycemia, such as sleepiness, dizziness, and confusion, are similar to symptoms of having too much alcohol. Do not drink alcohol if: Your health care provider tells you not to drink. You are pregnant, may be pregnant, or are planning to become pregnant. If you drink alcohol: Do not drink on an empty stomach. Limit how much you use to: 0-1 drink a day for women. 0-2 drinks a day for men. Be aware of how much alcohol is in your drink. In the [place] one drink equals one 12 oz bottle of beer (355 mL), one 5 oz glass of wine (148 mL), or one 1 oz glass of hard liquor (44 mL). Keep yourself hydrated with water, diet soda, or unsweetened iced tea. Keep in mind that regular soda, juice, and other mixers may contain a lot of sugar and must be counted as carbs. What are tips for following this plan?  Reading food labels Start by checking the serving size on the "Nutrition Facts" label of packaged foods and drinks. The amount of calories, carbs, fats, and other nutrients listed on the label is based on one serving of the item. Many items contain more than one serving per package. Check the total grams (g) of carbs in one serving. You can calculate the number of servings of carbs in one serving by dividing the total  carbs by 15. For example, if a food has 30 g of total carbs per serving, it would be equal to 2 servings of carbs. Check the number of grams (g) of saturated fats and trans fats in one serving. Choose foods that have a low amount or none of these fats. Check the number of milligrams (mg) of salt (sodium) in one serving. Most people should limit total sodium intake to less than 2,300 mg per day. Always check the nutrition information of foods labeled as "low-fat" or "nonfat." These foods may be higher in added sugar or refined carbs and should be avoided. Talk to your dietitian to identify your daily goals for nutrients listed on the label. Shopping Avoid buying canned, pre-made, or processed foods. These foods tend to be high in fat, sodium, and added sugar. Shop around the outside edge of the grocery store. This is where you will most often find fresh fruits and vegetables, bulk grains, fresh  meats, and fresh dairy. Cooking Use low-heat cooking methods, such as baking, instead of high-heat cooking methods like deep frying. Cook using healthy oils, such as olive, canola, or sunflower oil. Avoid cooking with butter, cream, or high-fat meats. Meal planning Eat meals and snacks regularly, preferably at the same times every day. Avoid going long periods of time without eating. Eat foods that are high in fiber, such as fresh fruits, vegetables, beans, and whole grains. Talk with your dietitian about how many servings of carbs you can eat at each meal. Eat 4-6 oz (112-168 g) of lean protein each day, such as lean meat, chicken, fish, eggs, or tofu. One ounce (oz) of lean protein is equal to: 1 oz (28 g) of meat, chicken, or fish. 1 egg.  cup (62 g) of tofu. Eat some foods each day that contain healthy fats, such as avocado, nuts, seeds, and fish. What foods should I eat? Fruits Berries. Apples. Oranges. Peaches. Apricots. Plums. Grapes. Mango. Papaya.Pomegranate. Kiwi.  Cherries. Vegetables Lettuce. Spinach. Leafy greens, including kale, chard, collard greens, and mustard greens. Beets. Cauliflower. Cabbage. Broccoli. Carrots. Green beans.Tomatoes. Peppers. Onions. Cucumbers. Brussels sprouts. Grains Whole grains, such as whole-wheat or whole-grain bread, crackers, tortillas,cereal, and pasta. Unsweetened oatmeal. Quinoa. Brown or wild rice. Meats and other proteins Seafood. Poultry without skin. Lean cuts of poultry and beef. Tofu. Nuts. Seeds. Dairy Low-fat or fat-free dairy products such as milk, yogurt, and cheese. The items listed above may not be a complete list of foods and beverages you can eat. Contact a dietitian for more information. What foods should I avoid? Fruits Fruits canned with syrup. Vegetables Canned vegetables. Frozen vegetables with butter or cream sauce. Grains Refined white flour and flour products such as bread, pasta, snack foods, andcereals. Avoid all processed foods. Meats and other proteins Fatty cuts of meat. Poultry with skin. Breaded or fried meats. Processed meat.Avoid saturated fats. Dairy Full-fat yogurt, cheese, or milk. Beverages Sweetened drinks, such as soda or iced tea. The items listed above may not be a complete list of foods and beverages you should avoid. Contact a dietitian for more information. Questions to ask a health care provider Do I need to meet with a diabetes educator? Do I need to meet with a dietitian? What number can I call if I have questions? When are the best times to check my blood glucose? Where to find more information: American Diabetes Association: diabetes.org Academy of Nutrition and Dietetics: www.eatright.[employer] of Diabetes and Digestive and Kidney Diseases: [place] Association of Diabetes Care and Education Specialists: www.diabeteseducator.org Summary It is important to have healthy eating habits because your blood sugar (glucose) levels are greatly  affected by what you eat and drink. A healthy meal plan will help you control your blood glucose and maintain a healthy lifestyle. Your health care provider may recommend that you work with a dietitian to make a meal plan that is best for you. Keep in mind that carbohydrates (carbs) and alcohol have immediate effects on your blood glucose levels. It is important to count carbs and to use alcohol carefully. This information is not intended to replace advice given to you by your health care provider. Make sure you discuss any questions you have with your healthcare provider. Document Revised: 07/10/2019 Document Reviewed: 07/10/2019 Elsevier Patient Education  2021 [place], MD [facility]

## 2021-03-02 NOTE — Patient Instructions (Signed)
Diabetes Mellitus and Nutrition, Adult When you have diabetes, or diabetes mellitus, it is very important to have healthy eating habits because your blood sugar (glucose) levels are greatly affected by what you eat and drink. Eating healthy foods in the right amounts, at about the same times every day, can help you:  Control your blood glucose.  Lower your risk of heart disease.  Improve your blood pressure.  Reach or maintain a healthy weight. What can affect my meal plan? Every person with diabetes is different, and each person has different needs for a meal plan. Your health care provider may recommend that you work with a dietitian to make a meal plan that is best for you. Your meal plan may vary depending on factors such as:  The calories you need.  The medicines you take.  Your weight.  Your blood glucose, blood pressure, and cholesterol levels.  Your activity level.  Other health conditions you have, such as heart or kidney disease. How do carbohydrates affect me? Carbohydrates, also called carbs, affect your blood glucose level more than any other type of food. Eating carbs naturally raises the amount of glucose in your blood. Carb counting is a method for keeping track of how many carbs you eat. Counting carbs is important to keep your blood glucose at a healthy level, especially if you use insulin or take certain oral diabetes medicines. It is important to know how many carbs you can safely have in each meal. This is different for every person. Your dietitian can help you calculate how many carbs you should have at each meal and for each snack. How does alcohol affect me? Alcohol can cause a sudden decrease in blood glucose (hypoglycemia), especially if you use insulin or take certain oral diabetes medicines. Hypoglycemia can be a life-threatening condition. Symptoms of hypoglycemia, such as sleepiness, dizziness, and confusion, are similar to symptoms of having too much  alcohol.  Do not drink alcohol if: ? Your health care provider tells you not to drink. ? You are pregnant, may be pregnant, or are planning to become pregnant.  If you drink alcohol: ? Do not drink on an empty stomach. ? Limit how much you use to:  0-1 drink a day for women.  0-2 drinks a day for men. ? Be aware of how much alcohol is in your drink. In the [place] one drink equals one 12 oz bottle of beer (355 mL), one 5 oz glass of wine (148 mL), or one 1 oz glass of hard liquor (44 mL). ? Keep yourself hydrated with water, diet soda, or unsweetened iced tea.  Keep in mind that regular soda, juice, and other mixers may contain a lot of sugar and must be counted as carbs. What are tips for following this plan? Reading food labels  Start by checking the serving size on the "Nutrition Facts" label of packaged foods and drinks. The amount of calories, carbs, fats, and other nutrients listed on the label is based on one serving of the item. Many items contain more than one serving per package.  Check the total grams (g) of carbs in one serving. You can calculate the number of servings of carbs in one serving by dividing the total carbs by 15. For example, if a food has 30 g of total carbs per serving, it would be equal to 2 servings of carbs.  Check the number of grams (g) of saturated fats and trans fats in one serving. Choose foods that have   a low amount or none of these fats.  Check the number of milligrams (mg) of salt (sodium) in one serving. Most people should limit total sodium intake to less than 2,300 mg per day.  Always check the nutrition information of foods labeled as "low-fat" or "nonfat." These foods may be higher in added sugar or refined carbs and should be avoided.  Talk to your dietitian to identify your daily goals for nutrients listed on the label. Shopping  Avoid buying canned, pre-made, or processed foods. These foods tend to be high in fat, sodium, and added  sugar.  Shop around the outside edge of the grocery store. This is where you will most often find fresh fruits and vegetables, bulk grains, fresh meats, and fresh dairy. Cooking  Use low-heat cooking methods, such as baking, instead of high-heat cooking methods like deep frying.  Cook using healthy oils, such as olive, canola, or sunflower oil.  Avoid cooking with butter, cream, or high-fat meats. Meal planning  Eat meals and snacks regularly, preferably at the same times every day. Avoid going long periods of time without eating.  Eat foods that are high in fiber, such as fresh fruits, vegetables, beans, and whole grains. Talk with your dietitian about how many servings of carbs you can eat at each meal.  Eat 4-6 oz (112-168 g) of lean protein each day, such as lean meat, chicken, fish, eggs, or tofu. One ounce (oz) of lean protein is equal to: ? 1 oz (28 g) of meat, chicken, or fish. ? 1 egg. ?  cup (62 g) of tofu.  Eat some foods each day that contain healthy fats, such as avocado, nuts, seeds, and fish.   What foods should I eat? Fruits Berries. Apples. Oranges. Peaches. Apricots. Plums. Grapes. Mango. Papaya. Pomegranate. Kiwi. Cherries. Vegetables Lettuce. Spinach. Leafy greens, including kale, chard, collard greens, and mustard greens. Beets. Cauliflower. Cabbage. Broccoli. Carrots. Green beans. Tomatoes. Peppers. Onions. Cucumbers. Brussels sprouts. Grains Whole grains, such as whole-wheat or whole-grain bread, crackers, tortillas, cereal, and pasta. Unsweetened oatmeal. Quinoa. Brown or wild rice. Meats and other proteins Seafood. Poultry without skin. Lean cuts of poultry and beef. Tofu. Nuts. Seeds. Dairy Low-fat or fat-free dairy products such as milk, yogurt, and cheese. The items listed above may not be a complete list of foods and beverages you can eat. Contact a dietitian for more information. What foods should I avoid? Fruits Fruits canned with  syrup. Vegetables Canned vegetables. Frozen vegetables with butter or cream sauce. Grains Refined white flour and flour products such as bread, pasta, snack foods, and cereals. Avoid all processed foods. Meats and other proteins Fatty cuts of meat. Poultry with skin. Breaded or fried meats. Processed meat. Avoid saturated fats. Dairy Full-fat yogurt, cheese, or milk. Beverages Sweetened drinks, such as soda or iced tea. The items listed above may not be a complete list of foods and beverages you should avoid. Contact a dietitian for more information. Questions to ask a health care provider  Do I need to meet with a diabetes educator?  Do I need to meet with a dietitian?  What number can I call if I have questions?  When are the best times to check my blood glucose? Where to find more information:  American Diabetes Association: diabetes.org  Academy of Nutrition and Dietetics: www.eatright.org  National Institute of Diabetes and Digestive and Kidney Diseases: www.niddk.nih.gov  Association of Diabetes Care and Education Specialists: www.diabeteseducator.org Summary  It is important to have healthy eating   habits because your blood sugar (glucose) levels are greatly affected by what you eat and drink.  A healthy meal plan will help you control your blood glucose and maintain a healthy lifestyle.  Your health care provider may recommend that you work with a dietitian to make a meal plan that is best for you.  Keep in mind that carbohydrates (carbs) and alcohol have immediate effects on your blood glucose levels. It is important to count carbs and to use alcohol carefully. This information is not intended to replace advice given to you by your health care provider. Make sure you discuss any questions you have with your health care provider. Document Revised: 07/10/2019 Document Reviewed: 07/10/2019 Elsevier Patient Education  2021 Elsevier Inc.  

## 2021-05-21 ENCOUNTER — Other Ambulatory Visit: Payer: Self-pay | Admitting: Emergency Medicine

## 2021-05-21 DIAGNOSIS — G8929 Other chronic pain: Secondary | ICD-10-CM

## 2021-05-28 ENCOUNTER — Encounter: Payer: Self-pay | Admitting: Internal Medicine

## 2021-05-28 ENCOUNTER — Other Ambulatory Visit: Payer: Self-pay

## 2021-05-28 ENCOUNTER — Ambulatory Visit: Payer: BC Managed Care – PPO | Admitting: Internal Medicine

## 2021-05-28 DIAGNOSIS — M79671 Pain in right foot: Secondary | ICD-10-CM

## 2021-05-28 DIAGNOSIS — M79672 Pain in left foot: Secondary | ICD-10-CM

## 2021-05-28 DIAGNOSIS — I152 Hypertension secondary to endocrine disorders: Secondary | ICD-10-CM

## 2021-05-28 DIAGNOSIS — E1159 Type 2 diabetes mellitus with other circulatory complications: Secondary | ICD-10-CM | POA: Diagnosis not present

## 2021-05-28 DIAGNOSIS — I872 Venous insufficiency (chronic) (peripheral): Secondary | ICD-10-CM | POA: Diagnosis not present

## 2021-05-28 DIAGNOSIS — E1142 Type 2 diabetes mellitus with diabetic polyneuropathy: Secondary | ICD-10-CM

## 2021-05-28 NOTE — Patient Instructions (Signed)
It appears you have bilateral heel plantar fasciitis  Please wear soft sole shoes, and keep the heels off the ground, and no new walking program for now  Please see the Sports Medicine on the first floor, as cortisone shots is likely to help  Please continue all other medications as before, and refills have been done if requested.  Please have the pharmacy call with any other refills you may need.  Please keep your appointments with your specialists as you may have planned

## 2021-05-28 NOTE — Progress Notes (Signed)
Patient ID: Johnny Andrews, male   DOB: 1961/02/18, 60 y.o.   MRN: [id number]        Chief Complaint: follow up bilateral heel pain with supermorbid obesity, neuropathy       HPI:  Johnny Andrews is a 60 y.o. male here with c/o bilat heel pain noticeable for 4 days, dull with sharp flares, worse first thing in the am to walk or after sitting for some time.  No trauma, swelling, ulcer.  Has chronic neuropathy and states overall has little to no other pain in the feet and ankles. No other ulcers, fever, drainage.  Wt down a few lbs, has been trying to be more active lately.  Pt denies chest pain, increased sob or doe, wheezing, orthopnea, PND, increased LE swelling, palpitations, dizziness or syncope.   Pt denies polydipsia, polyuria,       Wt Readings from Last 3 Encounters:  05/28/21 (!) 429 lb (194.6 kg)  03/02/21 (!) 432 lb (196 kg)  12/22/20 (!) 434 lb (196.9 kg)   BP Readings from Last 3 Encounters:  05/28/21 118/70  03/02/21 138/70  12/22/20 132/86         Past Medical History:  Diagnosis Date   ALCOHOL USE 10/24/2008   ANXIETY 09/19/2007   Cough 05/22/2008   Diabetes mellitus without complication ([place])    Phreesia 06/02/2020   DIABETES MELLITUS, TYPE II 09/19/2007   External thrombosed hemorrhoids 08/23/2008   GOUT 04/17/2010   HYPERLIPIDEMIA 09/19/2007   HYPERTENSION 09/19/2007   Hypertension    [facility]ia 06/02/2020   HYPOTHYROIDISM 09/19/2007   Leg pain    OSTEOARTHRITIS 09/19/2007   OTHER DISEASES OF LUNG NOT ELSEWHERE CLASSIFIED 07/20/2010   Rash and other nonspecific skin eruption 09/19/2007   Restrictive lung disease    SCROTAL ABSCESS 05/27/2010   Sebaceous cyst 08/01/2008   SLEEP APNEA 08/22/2009   Sleep apnea    Phreesia 06/02/2020   SMOKER 04/17/2010   Thyroid disease    [facility]reesia 06/02/2020   Past Surgical History:  Procedure Laterality Date   COLONOSCOPY     NASAL SEPTUM SURGERY      reports that he quit smoking about 14 years ago. His smoking use included cigarettes.  He has a 15.00 pack-year smoking history. He has never used smokeless tobacco. He reports current alcohol use. He reports current drug use. family history includes Breast cancer in his mother; Cancer (age of onset: 79) in his brother; Heart disease in his father; Parkinson's disease in his mother. Allergies  Allergen Reactions   Other     Dust mites   Current Outpatient Medications on File Prior to Visit  Medication Sig Dispense Refill   ALPRAZolam (XANAX) 0.5 MG tablet Take 1 tablet (0.5 mg total) by mouth 2 (two) times daily as needed for anxiety. 30 tablet 1   blood glucose meter kit and supplies 1 each by Other route 2 (two) times daily. 1 each 1   diclofenac (VOLTAREN) 75 MG EC tablet TAKE 1 TABLET BY MOUTH TWICE A DAY 180 tablet 1   empagliflozin (JARDIANCE) 25 MG TABS tablet Take 25 mg by mouth daily.     Fluticasone-Umeclidin-Vilant (TRELEGY ELLIPTA) 100-62.5-25 MCG/INH AEPB Inhale 1 puff into the lungs daily. 60 each 3   gabapentin (NEURONTIN) 300 MG capsule Take 300 mg by mouth daily.     glucose blood (ONE TOUCH ULTRA TEST) test strip Two times a day for ULTRA 2 ONE TOUCH 100 each 1   insulin aspart (NOVOLOG) 100  UNIT/ML FlexPen Inject 75 Units into the skin 2 (two) times daily before a meal. 30 units am and 50 units pm     Insulin Pen Needle (BD ULTRA-FINE PEN NEEDLES) 29G X 12.7MM MISC USE TWO CHECK BLOOD SUGAR TWICE A DAY. APPOINTMENT NEEDED FOR FURTHER REFILLS 100 each 3   losartan-hydrochlorothiazide (HYZAAR) 100-12.5 MG tablet Take 1 tablet by mouth daily. 90 tablet 3   metFORMIN (GLUCOPHAGE-XR) 500 MG 24 hr tablet Take 1,000 mg by mouth 2 (two) times daily.      Respiratory Therapy Supplies (ADULT MASK) MISC 2 Devices by Does not apply route once. 2 each 0   simvastatin (ZOCOR) 20 MG tablet Take 20 mg by mouth daily.     topiramate (TOPAMAX) 50 MG tablet TAKE 2 TABLETS BY MOUTH AT BEDTIME. 180 tablet 1   traZODone (DESYREL) 100 MG tablet TAKE 1 TABLET BY MOUTH EVERYDAY AT  BEDTIME 90 tablet 1   TRESIBA FLEXTOUCH 200 UNIT/ML SOPN INJECT 100 UNITS TWICE A DAY  6   albuterol (VENTOLIN HFA) 108 (90 Base) MCG/ACT inhaler TAKE 2 PUFFS BY MOUTH TWICE A DAY (Patient not taking: No sig reported) 6.7 each 1   levothyroxine (SYNTHROID) 137 MCG tablet Take 2 tablets (274 mcg total) by mouth daily before breakfast. 180 tablet 3   meclizine (ANTIVERT) 50 MG tablet Take 1 tablet (50 mg total) by mouth 3 (three) times daily as needed. (Patient not taking: No sig reported) 30 tablet 1   No current facility-administered medications on file prior to visit.        ROS:  All others reviewed and negative.  Objective        PE:  BP 118/70 (BP Location: Right Arm, Patient Position: Sitting, Cuff Size: Large)   Pulse 89   Ht _0  (1.93 m)   Wt (!) 429 lb (194.6 kg)   SpO2 94%   BMI 52.22 kg/m                 Constitutional: Pt appears in NAD               HENT: Head: NCAT.                Right Ear: External ear normal.                 Left Ear: External ear normal.                Eyes: . Pupils are equal, round, and reactive to light. Conjunctivae and EOM are normal               Nose: without d/c or deformity               Neck: Neck supple. Gross normal ROM               Cardiovascular: Normal rate and regular rhythm.                 Pulmonary/Chest: Effort normal and breath sounds without rales or wheezing.                Abd:  Soft, NT, ND, + BS, no organomegaly               Neurological: Pt is alert. At baseline orientation, motor grossly intact               Skin: Skin is warm. No rashes, no other new lesions, LE edema - trace bilateral;  heels without swelling or ulcer, but mild tender plantar heels bilateral and minor callous formation               Psychiatric: Pt behavior is normal without agitation   Micro: none  Cardiac tracings I have personally interpreted today:  none  Pertinent Radiological findings (summarize): none   Lab Results  Component Value Date    WBC 6.6 07/08/2020   HGB 13.6 07/08/2020   HCT 41.6 (A) 07/08/2020   PLT 227 11/16/2017   GLUCOSE 207 (H) 07/08/2020   CHOL 165 07/08/2020   TRIG 278 (H) 07/08/2020   HDL 36 (L) 07/08/2020   LDLDIRECT 112.3 08/21/2014   LDLCALC 83 07/08/2020   ALT 20 07/08/2020   AST 16 07/08/2020   NA 140 07/08/2020   K 4.7 07/08/2020   CL 101 07/08/2020   CREATININE 0.98 07/08/2020   BUN 24 07/08/2020   CO2 23 07/08/2020   TSH 2.890 11/16/2017   PSA 2.64 10/09/2013   HGBA1C 7.0 (A) 03/02/2021   MICROALBUR 31.6 (H) 03/05/2015   Assessment/Plan:  Johnny Andrews is a 60 y.o. White or Caucasian [1] male with  has a past medical history of ALCOHOL USE (10/24/2008), ANXIETY (09/19/2007), Cough (05/22/2008), Diabetes mellitus without complication [facility]), DIABETES MELLITUS, TYPE II (09/19/2007), External thrombosed hemorrhoids (08/23/2008), GOUT (04/17/2010), HYPERLIPIDEMIA (09/19/2007), HYPERTENSION (09/19/2007), Hypertension, HYPOTHYROIDISM (09/19/2007), Leg pain, OSTEOARTHRITIS (09/19/2007), OTHER DISEASES OF LUNG NOT ELSEWHERE CLASSIFIED (07/20/2010), Rash and other nonspecific skin eruption (09/19/2007), Restrictive lung disease, SCROTAL ABSCESS (05/27/2010), Sebaceous cyst (08/01/2008), SLEEP APNEA (08/22/2009), Sleep apnea, SMOKER (04/17/2010), and Thyroid disease.  Heel pain, bilateral Exam c/w bilateral plantar fasciitis, for soft soled shoes, feet and leg elevation to keep pressure off the heels, avoid new walking program for now but to see sport medicine on first floor for probable cortisone shots  Diabetic peripheral neuropathy ([place]) Advised pt to f/u with podiatry on regular basis as well  Hypertension associated with diabetes ([place]) BP Readings from Last 3 Encounters:  05/28/21 118/70  03/02/21 138/70  12/22/20 132/86   Stable, pt to continue medical treatment losartan hct   Chronic venous insufficiency Chronic stable, for compression stockings during day as well  Followup: Return if symptoms worsen or  fail to improve.  Cathlean Cower, MD 05/30/2021 8:00 PM [facility]

## 2021-05-29 NOTE — Progress Notes (Signed)
   I, [name], LAT, ATC acting as a scribe for Johnny Graham, MD.  Subjective:    CC: Bilat foot pain  HPI: Pt is a 60 y/o male c/o bilat foot pain ongoing for about a week w/ no MOI, L>R. Pt locates pain to the plantar aspect of bilat heels. Pt notes he has a hx of neuropathy in bilat feet.  Aggravates: worse in the mornings Treatments tried: rest  Pertinent review of Systems: no fever or chills  Relevant historical information: DM, Hx neuropathy. Obesity   Objective:    Vitals:   06/01/21 1029  BP: 118/72  Pulse: 100  SpO2: 92%   General: Well Developed, well nourished, and in no acute distress.   MSK: Left foot normal-appearing.  No ulcerations.  Tender palpation plantar calcaneus. Normal foot and ankle motion.  Normal strength. Right foot normal-appearing.  No ulcerations.  Tender palpation plantar calcaneus.  Normal foot and ankle motion.  Normal strength.  Lab and Radiology Results Procedure: Real-time Ultrasound Guided Injection of left plantar fascia origin plantar calcaneus Device: Philips Affiniti 50G Images permanently stored and available for review in PACS Verbal informed consent obtained.  Discussed risks and benefits of procedure. Warned about infection bleeding damage to structures skin hypopigmentation and fat atrophy among others. Patient expresses understanding and agreement Time-out conducted.   Noted no overlying erythema, induration, or other signs of local infection.   Skin prepped in a sterile fashion.   Local anesthesia: Topical Ethyl chloride.   With sterile technique and under real time ultrasound guidance:  40mg  kenalog and 27ml lidocaine injected into plantar fascia origin. Fluid seen entering the plantar fascia.   Completed without difficulty   Pain immediately resolved suggesting accurate placement of the medication.   Advised to call if fevers/chills, erythema, induration, drainage, or persistent bleeding.   Images permanently  stored and available for review in the ultrasound unit.  Impression: Technically successful ultrasound guided injection.       Impression and Recommendations:    Assessment and Plan: 60 y.o. male with bilateral plantar foot pain left worse than right.  Plan to treat with conservative management strategies initially including night splints, home exercise program taught in clinic today by ATC, and gel heel cups.  Additionally since he is not quite a bit of pain we will proceed with left-sided plantar fascia injection.  Can proceed with right-sided injection as early as 1 week.  Would like to avoid bilateral injections same day to minimize hyperglycemia.  Recheck in 1 month or return in 1 week for injection.  PDMP not reviewed this encounter. Orders Placed This Encounter  Procedures   67 LIMITED JOINT SPACE STRUCTURES LOW BILAT(NO LINKED CHARGES)    Standing Status:   Future    Number of Occurrences:   1    Standing Expiration Date:   11/30/2021    Order Specific Question:   Reason for Exam (SYMPTOM  OR DIAGNOSIS REQUIRED)    Answer:   bilateral foot pain    Order Specific Question:   Preferred imaging location?    Answer:   [facility]   No orders of the defined types were placed in this encounter.   Discussed warning signs or symptoms. Please see discharge instructions. Patient expresses understanding.   The above documentation has been reviewed and is accurate and complete 12/02/2021, M.D.

## 2021-05-30 ENCOUNTER — Encounter: Payer: Self-pay | Admitting: Internal Medicine

## 2021-05-30 DIAGNOSIS — M79671 Pain in right foot: Secondary | ICD-10-CM | POA: Insufficient documentation

## 2021-05-30 NOTE — Assessment & Plan Note (Signed)
Chronic stable, for compression stockings during day as well

## 2021-05-30 NOTE — Assessment & Plan Note (Signed)
Exam c/w bilateral plantar fasciitis, for soft soled shoes, feet and leg elevation to keep pressure off the heels, avoid new walking program for now but to see sport medicine on first floor for probable cortisone shots

## 2021-05-30 NOTE — Assessment & Plan Note (Signed)
Advised pt to f/u with podiatry on regular basis as well

## 2021-05-30 NOTE — Assessment & Plan Note (Signed)
BP Readings from Last 3 Encounters:  05/28/21 118/70  03/02/21 138/70  12/22/20 132/86   Stable, pt to continue medical treatment losartan hct

## 2021-06-01 ENCOUNTER — Ambulatory Visit: Payer: Medicare PPO | Admitting: Family Medicine

## 2021-06-01 ENCOUNTER — Other Ambulatory Visit: Payer: Self-pay

## 2021-06-01 ENCOUNTER — Ambulatory Visit: Payer: Self-pay

## 2021-06-01 VITALS — BP 118/72 | HR 100 | Ht 77.0 in

## 2021-06-01 DIAGNOSIS — M79671 Pain in right foot: Secondary | ICD-10-CM | POA: Diagnosis not present

## 2021-06-01 DIAGNOSIS — M79672 Pain in left foot: Secondary | ICD-10-CM

## 2021-06-01 DIAGNOSIS — M722 Plantar fascial fibromatosis: Secondary | ICD-10-CM | POA: Diagnosis not present

## 2021-06-01 NOTE — Patient Instructions (Addendum)
Thank you for coming in today.   Call or go to the ER if you develop a large red swollen joint with extreme pain or oozing puss.    I can inject the other foot as early 1 week   Please complete the exercises that the athletic trainer went over with you:  View at www.my-exercise-code.com using code: V7195022  Use good footwear especially at home.   Night splint for plantar fasciitis.   Recheck in 1 month.

## 2021-06-02 ENCOUNTER — Ambulatory Visit: Payer: BC Managed Care – PPO | Admitting: Emergency Medicine

## 2021-06-06 ENCOUNTER — Other Ambulatory Visit: Payer: Self-pay | Admitting: Emergency Medicine

## 2021-06-09 ENCOUNTER — Ambulatory Visit: Payer: Medicare PPO | Admitting: Family Medicine

## 2021-09-07 ENCOUNTER — Other Ambulatory Visit: Payer: Self-pay | Admitting: Emergency Medicine

## 2021-09-07 DIAGNOSIS — G6289 Other specified polyneuropathies: Secondary | ICD-10-CM

## 2021-10-15 ENCOUNTER — Other Ambulatory Visit: Payer: Self-pay | Admitting: Emergency Medicine

## 2021-10-15 DIAGNOSIS — F418 Other specified anxiety disorders: Secondary | ICD-10-CM

## 2021-11-06 ENCOUNTER — Other Ambulatory Visit: Payer: Self-pay | Admitting: Emergency Medicine

## 2021-11-06 DIAGNOSIS — G8929 Other chronic pain: Secondary | ICD-10-CM

## 2021-12-03 ENCOUNTER — Telehealth: Payer: Self-pay | Admitting: Emergency Medicine

## 2021-12-03 ENCOUNTER — Encounter: Payer: Self-pay | Admitting: Emergency Medicine

## 2021-12-03 ENCOUNTER — Ambulatory Visit (INDEPENDENT_AMBULATORY_CARE_PROVIDER_SITE_OTHER): Payer: Medicare PPO | Admitting: Emergency Medicine

## 2021-12-03 VITALS — BP 134/72 | HR 84 | Temp 97.9°F | Ht 77.0 in | Wt >= 6400 oz

## 2021-12-03 DIAGNOSIS — F418 Other specified anxiety disorders: Secondary | ICD-10-CM | POA: Diagnosis not present

## 2021-12-03 DIAGNOSIS — M159 Polyosteoarthritis, unspecified: Secondary | ICD-10-CM

## 2021-12-03 DIAGNOSIS — I152 Hypertension secondary to endocrine disorders: Secondary | ICD-10-CM | POA: Diagnosis not present

## 2021-12-03 DIAGNOSIS — E785 Hyperlipidemia, unspecified: Secondary | ICD-10-CM | POA: Diagnosis not present

## 2021-12-03 DIAGNOSIS — E1159 Type 2 diabetes mellitus with other circulatory complications: Secondary | ICD-10-CM | POA: Diagnosis not present

## 2021-12-03 DIAGNOSIS — Z130 Encounter for screening for diseases of the blood and blood-forming organs and certain disorders involving the immune mechanism: Secondary | ICD-10-CM | POA: Diagnosis not present

## 2021-12-03 DIAGNOSIS — M150 Primary generalized (osteo)arthritis: Secondary | ICD-10-CM

## 2021-12-03 DIAGNOSIS — Z0001 Encounter for general adult medical examination with abnormal findings: Secondary | ICD-10-CM

## 2021-12-03 DIAGNOSIS — I10 Essential (primary) hypertension: Secondary | ICD-10-CM | POA: Diagnosis not present

## 2021-12-03 DIAGNOSIS — Z125 Encounter for screening for malignant neoplasm of prostate: Secondary | ICD-10-CM | POA: Diagnosis not present

## 2021-12-03 DIAGNOSIS — Z13220 Encounter for screening for lipoid disorders: Secondary | ICD-10-CM

## 2021-12-03 DIAGNOSIS — Z1329 Encounter for screening for other suspected endocrine disorder: Secondary | ICD-10-CM

## 2021-12-03 DIAGNOSIS — E1169 Type 2 diabetes mellitus with other specified complication: Secondary | ICD-10-CM

## 2021-12-03 DIAGNOSIS — Z13228 Encounter for screening for other metabolic disorders: Secondary | ICD-10-CM

## 2021-12-03 DIAGNOSIS — Z1211 Encounter for screening for malignant neoplasm of colon: Secondary | ICD-10-CM

## 2021-12-03 LAB — POCT GLYCOSYLATED HEMOGLOBIN (HGB A1C): Hemoglobin A1C: 6.5 % — AB (ref 4.0–5.6)

## 2021-12-03 MED ORDER — ALPRAZOLAM 0.5 MG PO TABS: 0.5000 mg | ORAL_TABLET | Freq: Two times a day (BID) | ORAL | 2 refills | Status: DC | PRN

## 2021-12-03 NOTE — Telephone Encounter (Signed)
Pt states order for the wheelchair lift discussed at todays visit should be faxed to [facility] ? ?Fax [phone] ?

## 2021-12-03 NOTE — Patient Instructions (Signed)
Health Maintenance, Male Adopting a healthy lifestyle and getting preventive care are important in promoting health and wellness. Ask your health care provider about: The right schedule for you to have regular tests and exams. Things you can do on your own to prevent diseases and keep yourself healthy. What should I know about diet, weight, and exercise? Eat a healthy diet  Eat a diet that includes plenty of vegetables, fruits, low-fat dairy products, and lean protein. Do not eat a lot of foods that are high in solid fats, added sugars, or sodium. Maintain a healthy weight Body mass index (BMI) is a measurement that can be used to identify possible weight problems. It estimates body fat based on height and weight. Your health care provider can help determine your BMI and help you achieve or maintain a healthy weight. Get regular exercise Get regular exercise. This is one of the most important things you can do for your health. Most adults should: Exercise for at least 150 minutes each week. The exercise should increase your heart rate and make you sweat (moderate-intensity exercise). Do strengthening exercises at least twice a week. This is in addition to the moderate-intensity exercise. Spend less time sitting. Even light physical activity can be beneficial. Watch cholesterol and blood lipids Have your blood tested for lipids and cholesterol at 61 years of age, then have this test every 5 years. You may need to have your cholesterol levels checked more often if: Your lipid or cholesterol levels are high. You are older than 61 years of age. You are at high risk for heart disease. What should I know about cancer screening? Many types of cancers can be detected early and may often be prevented. Depending on your health history and family history, you may need to have cancer screening at various ages. This may include screening for: Colorectal cancer. Prostate cancer. Skin cancer. Lung  cancer. What should I know about heart disease, diabetes, and high blood pressure? Blood pressure and heart disease High blood pressure causes heart disease and increases the risk of stroke. This is more likely to develop in people who have high blood pressure readings or are overweight. Talk with your health care provider about your target blood pressure readings. Have your blood pressure checked: Every 3-5 years if you are 18-39 years of age. Every year if you are 40 years old or older. If you are between the ages of 65 and 75 and are a current or former smoker, ask your health care provider if you should have a one-time screening for abdominal aortic aneurysm (AAA). Diabetes Have regular diabetes screenings. This checks your fasting blood sugar level. Have the screening done: Once every three years after age 45 if you are at a normal weight and have a low risk for diabetes. More often and at a younger age if you are overweight or have a high risk for diabetes. What should I know about preventing infection? Hepatitis B If you have a higher risk for hepatitis B, you should be screened for this virus. Talk with your health care provider to find out if you are at risk for hepatitis B infection. Hepatitis C Blood testing is recommended for: Everyone born from 1945 through 1965. Anyone with known risk factors for hepatitis C. Sexually transmitted infections (STIs) You should be screened each year for STIs, including gonorrhea and chlamydia, if: You are sexually active and are younger than 61 years of age. You are older than 61 years of age and your   health care provider tells you that you are at risk for this type of infection. Your sexual activity has changed since you were last screened, and you are at increased risk for chlamydia or gonorrhea. Ask your health care provider if you are at risk. Ask your health care provider about whether you are at high risk for HIV. Your health care provider  may recommend a prescription medicine to help prevent HIV infection. If you choose to take medicine to prevent HIV, you should first get tested for HIV. You should then be tested every 3 months for as long as you are taking the medicine. Follow these instructions at home: Alcohol use Do not drink alcohol if your health care provider tells you not to drink. If you drink alcohol: Limit how much you have to 0-2 drinks a day. Know how much alcohol is in your drink. In the [place] one drink equals one 12 oz bottle of beer (355 mL), one 5 oz glass of wine (148 mL), or one 1 oz glass of hard liquor (44 mL). Lifestyle Do not use any products that contain nicotine or tobacco. These products include cigarettes, chewing tobacco, and vaping devices, such as e-cigarettes. If you need help quitting, ask your health care provider. Do not use street drugs. Do not share needles. Ask your health care provider for help if you need support or information about quitting drugs. General instructions Schedule regular health, dental, and eye exams. Stay current with your vaccines. Tell your health care provider if: You often feel depressed. You have ever been abused or do not feel safe at home. Summary Adopting a healthy lifestyle and getting preventive care are important in promoting health and wellness. Follow your health care provider's instructions about healthy diet, exercising, and getting tested or screened for diseases. Follow your health care provider's instructions on monitoring your cholesterol and blood pressure. This information is not intended to replace advice given to you by your health care provider. Make sure you discuss any questions you have with your health care provider. Document Revised: 12/22/2020 Document Reviewed: 12/22/2020 Elsevier Patient Education  2023 Elsevier Inc.  

## 2021-12-03 NOTE — Progress Notes (Signed)
Johnny Andrews ?60 y.o. ? ? ?Chief Complaint  ?Patient presents with  ? possible physical  ?  Would like to do a wellness check, discuss getting a lift chair for knee issues  ? ? ?HISTORY OF PRESENT ILLNESS: ?This is a 61 y.o. male here for annual exam. ?Multiple chronic medical problems reviewed. ?Medication list reviewed. ?Requesting lift chair due to chronic bilateral knee problems.  States insurance will cover 80% of the cost. ? ?HPI ? ? ?Prior to Admission medications   ?Medication Sig Start Date End Date Taking? Authorizing Provider  ?albuterol (VENTOLIN HFA) 108 (90 Base) MCG/ACT inhaler TAKE 2 PUFFS BY MOUTH TWICE A DAY ?Patient not taking: No sig reported 09/12/20   Horald Pollen, MD  ?ALPRAZolam Duanne Moron) 0.5 MG tablet TAKE 1 TABLET BY MOUTH 2 TIMES DAILY AS NEEDED FOR ANXIETY. 10/15/21   Horald Pollen, MD  ?blood glucose meter kit and supplies 1 each by Other route 2 (two) times daily. 03/14/20   Horald Pollen, MD  ?diclofenac (VOLTAREN) 75 MG EC tablet TAKE 1 TABLET BY MOUTH TWICE A DAY 11/06/21   Horald Pollen, MD  ?empagliflozin (JARDIANCE) 25 MG TABS tablet Take 25 mg by mouth daily.    [provider]  ?Fluticasone-Umeclidin-Vilant (TRELEGY ELLIPTA) 100-62.5-25 MCG/INH AEPB Inhale 1 puff into the lungs daily. 10/22/20   Laurin Coder, MD  ?gabapentin (NEURONTIN) 300 MG capsule Take 300 mg by mouth daily. 08/20/20   [provider]  ?glucose blood (ONE TOUCH ULTRA TEST) test strip Two times a day for ULTRA 2 ONE TOUCH 02/28/20   Horald Pollen, MD  ?insulin aspart (NOVOLOG) 100 UNIT/ML FlexPen Inject 75 Units into the skin 2 (two) times daily before a meal. 30 units am and 50 units pm    [provider]  ?Insulin Pen Needle (BD ULTRA-FINE PEN NEEDLES) 29G X 12.7MM MISC USE TWO CHECK BLOOD SUGAR TWICE A DAY. APPOINTMENT NEEDED FOR FURTHER REFILLS 02/28/20   Horald Pollen, MD  ?levothyroxine (SYNTHROID) 137 MCG tablet Take 2 tablets  (274 mcg total) by mouth daily before breakfast. 03/04/20 06/09/20  Horald Pollen, MD  ?losartan-hydrochlorothiazide [facility]) 100-12.5 MG tablet Take 1 tablet by mouth daily. 08/28/20   Horald Pollen, MD  ?meclizine (ANTIVERT) 50 MG tablet Take 1 tablet (50 mg total) by mouth 3 (three) times daily as needed. ?Patient not taking: No sig reported 06/09/20   Horald Pollen, MD  ?metFORMIN (GLUCOPHAGE-XR) 500 MG 24 hr tablet Take 1,000 mg by mouth 2 (two) times daily.  09/15/15   [provider]  ?Respiratory Therapy Supplies (ADULT MASK) MISC 2 Devices by Does not apply route once. 11/24/11   Rigoberto Noel, MD  ?simvastatin (ZOCOR) 20 MG tablet Take 20 mg by mouth daily.    [provider]  ?topiramate (TOPAMAX) 50 MG tablet TAKE 2 TABLETS BY MOUTH AT BEDTIME 09/07/21   Horald Pollen, MD  ?traZODone (DESYREL) 100 MG tablet TAKE 1 TABLET BY MOUTH EVERYDAY AT BEDTIME 06/07/21   Horald Pollen, MD  ?TRESIBA FLEXTOUCH 200 UNIT/ML SOPN INJECT 100 UNITS TWICE A DAY 12/17/16   [provider]  ? ? ?Allergies  ?Allergen Reactions  ? Other   ?  Dust mites  ? ? ?Patient Active Problem List  ? Diagnosis Date Noted  ? Plantar fasciitis, bilateral 06/01/2021  ? Heel pain, bilateral 05/30/2021  ? Claudication ([place]) 07/08/2020  ? Chronic venous insufficiency 07/25/2019  ? Skin lesion  of scalp 07/25/2019  ? Encounter for orthopedic follow-up care 08/14/2018  ? Carpal tunnel syndrome of right wrist 04/24/2018  ? Diabetic peripheral neuropathy ([place]) 04/24/2018  ? Morbid obesity ([place]) 01/27/2018  ? Osteoarthritis of left knee 12/16/2017  ? Osteoarthritis of right knee 12/16/2017  ? Family history of prostate cancer 03/06/2017  ? Pure hypercholesterolemia 09/13/2016  ? Lumbar degenerative disc disease 07/22/2016  ? Spinal stenosis of lumbar region 11/03/2015  ? Abnormality of gait 09/15/2015  ? Restrictive lung disease 05/05/2015  ? Dyspnea 03/14/2015  ? Impotence of organic  origin 02/05/2013  ? Hypogonadism male 02/05/2013  ? Elevated PSA 11/30/2011  ? OTHER DISEASES OF LUNG NOT ELSEWHERE CLASSIFIED 07/20/2010  ? GOUT 04/17/2010  ? SMOKER 04/17/2010  ? Sleep apnea 08/22/2009  ? Hypothyroidism 09/19/2007  ? Dyslipidemia associated with type 2 diabetes mellitus ([place]) 09/19/2007  ? Situational anxiety 09/19/2007  ? Hypertension associated with diabetes ([place]) 09/19/2007  ? Osteoarthritis 09/19/2007  ? ? ?Past Medical History:  ?Diagnosis Date  ? ALCOHOL USE 10/24/2008  ? ANXIETY 09/19/2007  ? Cough 05/22/2008  ? Diabetes mellitus without complication ([place])   ? Phreesia 06/02/2020  ? DIABETES MELLITUS, TYPE II 09/19/2007  ? External thrombosed hemorrhoids 08/23/2008  ? GOUT 04/17/2010  ? HYPERLIPIDEMIA 09/19/2007  ? HYPERTENSION 09/19/2007  ? Hypertension   ? Phreesia 06/02/2020  ? HYPOTHYROIDISM 09/19/2007  ? Leg pain   ? OSTEOARTHRITIS 09/19/2007  ? OTHER DISEASES OF LUNG NOT ELSEWHERE CLASSIFIED 07/20/2010  ? Rash and other nonspecific skin eruption 09/19/2007  ? Restrictive lung disease   ? SCROTAL ABSCESS 05/27/2010  ? Sebaceous cyst 08/01/2008  ? SLEEP APNEA 08/22/2009  ? Sleep apnea   ? Phreesia 06/02/2020  ? SMOKER 04/17/2010  ? Thyroid disease   ? Phreesia 06/02/2020  ? ? ?Past Surgical History:  ?Procedure Laterality Date  ? COLONOSCOPY    ? NASAL SEPTUM SURGERY    ? ? ?Social History  ? ?Socioeconomic History  ? Marital status: Married  ?  Spouse name: Not on file  ? Number of children: 2  ? Years of education: BS  ? Highest education level: Not on file  ?Occupational History  ? Occupation: HVAC  ?  Employer: [employer]  ?Tobacco Use  ? Smoking status: Former  ?  Packs/day: 0.50  ?  Years: 30.00  ?  Pack years: 15.00  ?  Types: Cigarettes  ?  Quit date: 2008  ?  Years since quitting: 15.3  ? Smokeless tobacco: Never  ? Tobacco comments:  ?  30 years at most off and on smoking 03/14/15  ?Substance and Sexual Activity  ? Alcohol use: Yes  ?  Alcohol/week: 0.0 standard drinks  ?  Comment: 2 drinks per  day  ? Drug use: Yes  ?  Comment: Occasional Marijuana use  ? Sexual activity: Not on file  ?Other Topics Concern  ? Not on file  ?Social History Narrative  ? Originally from [place]. Has lived in [place], [place], [place], & [place] since 1993. 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 Currently has a dog & cats. Previously had a parrot (conure) 12 years ago for 1 year but in same house.  Has also had excessive exposure to pigeon feces. He has had very brief exposure to asbestos around 2000. No hot tub exposure. He has inhaled chemical fumes/gases/refridgerants through his work.  ?   ? Marital status: married x  26 years  ?    Children: 2 children (19, 22); no grandchildren  ?    Lives: Lives at home with his wife  ?    Employment: [employer] air [employer]  ?    Tobacco: none; quit in 2013; smoked x 1/2 ppd x 20 years  ?    Alcohol:  10 drinks; usually 2 drinks per day and then skip two days.  ?    Drugs:  None  ?    Exercise: none; job is physically demanding  ?   ? Right-handed.  ? 1 cup caffeine daily.  ? ?Social Determinants of Health  ? ?Financial Resource Strain: Not on file  ?Food Insecurity: Not on file  ?Transportation Needs: Not on file  ?Physical Activity: Not on file  ?Stress: Not on file  ?Social Connections: Not on file  ?Intimate Partner Violence: Not on file  ? ? ?Family History  ?Problem Relation Age of Onset  ? Breast cancer Mother   ?     Breast Cancer  ? Parkinson's disease Mother   ? Heart disease Father   ?     CABG, stenting cardiac  ? Cancer Brother 43  ?     prostate cancer  ? Lung disease Neg Hx   ? Autoimmune disease Neg Hx   ? ? ? ?Review of Systems  ?Constitutional: Negative.  Negative for chills and fever.  ?HENT: Negative.  Negative for congestion and sore throat.   ?Respiratory: Negative.  Negative for cough and shortness of breath.   ?Cardiovascular: Negative.  Negative for chest pain and palpitations.  ?Gastrointestinal:  Negative for abdominal  pain, nausea and vomiting.  ?Genitourinary: Negative.   ?Musculoskeletal:  Positive for back pain and joint pain.  ?Skin: Negative.  Negative for rash.  ?Neurological: Negative.  Negative for dizziness.  ?All othe

## 2021-12-07 NOTE — Telephone Encounter (Signed)
Called patient and was unable to leave message in regards to his DME order being faxed to [facility].  ?

## 2021-12-14 ENCOUNTER — Telehealth: Payer: Self-pay | Admitting: Emergency Medicine

## 2021-12-14 DIAGNOSIS — M1711 Unilateral primary osteoarthritis, right knee: Secondary | ICD-10-CM

## 2021-12-14 DIAGNOSIS — M1712 Unilateral primary osteoarthritis, left knee: Secondary | ICD-10-CM

## 2021-12-14 NOTE — Telephone Encounter (Signed)
Patient notified that new DME order for a lift chair has been place. Once signed by provider order will fax to [facility]  ? ?

## 2021-12-14 NOTE — Telephone Encounter (Signed)
Patient call about the [employer] order for the lift chair -  ?Patient called with code needed - E1399 will be needed on form ? ?And whatever the code for arthritic knees - this will also be needed to process the chair order to prove why he needs the lift. ? ?Please fax the order back using these codes. ?

## 2021-12-14 NOTE — Telephone Encounter (Signed)
Please look into this. Thanks.

## 2021-12-15 ENCOUNTER — Other Ambulatory Visit: Payer: Medicare PPO

## 2021-12-15 LAB — CBC WITH DIFFERENTIAL/PLATELET
Basophils Absolute: 0.1 10*3/uL (ref 0.0–0.1)
Basophils Relative: 1 % (ref 0.0–3.0)
Eosinophils Absolute: 0.3 10*3/uL (ref 0.0–0.7)
Eosinophils Relative: 3 % (ref 0.0–5.0)
HCT: 46.2 % (ref 39.0–52.0)
Hemoglobin: 15.3 g/dL (ref 13.0–17.0)
Lymphocytes Relative: 23.4 % (ref 12.0–46.0)
Lymphs Abs: 2.3 10*3/uL (ref 0.7–4.0)
MCHC: 33 g/dL (ref 30.0–36.0)
MCV: 89.9 fl (ref 78.0–100.0)
Monocytes Absolute: 1.2 10*3/uL — ABNORMAL HIGH (ref 0.1–1.0)
Monocytes Relative: 12.2 % — ABNORMAL HIGH (ref 3.0–12.0)
Neutro Abs: 5.9 10*3/uL (ref 1.4–7.7)
Neutrophils Relative %: 60.4 % (ref 43.0–77.0)
Platelets: 252 10*3/uL (ref 150.0–400.0)
RBC: 5.14 Mil/uL (ref 4.22–5.81)
RDW: 14.5 % (ref 11.5–15.5)
WBC: 9.8 10*3/uL (ref 4.0–10.5)

## 2021-12-15 LAB — MICROALBUMIN / CREATININE URINE RATIO
Creatinine,U: 100.6 mg/dL
Microalb Creat Ratio: 11.2 mg/g (ref 0.0–30.0)
Microalb, Ur: 11.2 mg/dL — ABNORMAL HIGH (ref 0.0–1.9)

## 2021-12-15 LAB — LIPID PANEL
Cholesterol: 216 mg/dL — ABNORMAL HIGH (ref 0–200)
HDL: 45.2 mg/dL (ref >39.00)
Total CHOL/HDL Ratio: 5
Triglycerides: 521 mg/dL — ABNORMAL HIGH (ref 0.0–149.0)

## 2021-12-15 LAB — LDL CHOLESTEROL, DIRECT: Direct LDL: 119 mg/dL

## 2021-12-15 LAB — COMPREHENSIVE METABOLIC PANEL
ALT: 24 U/L (ref 0–53)
AST: 18 U/L (ref 0–37)
Albumin: 4.5 g/dL (ref 3.5–5.2)
Alkaline Phosphatase: 60 U/L (ref 39–117)
BUN: 28 mg/dL — ABNORMAL HIGH (ref 6–23)
CO2: 27 mEq/L (ref 19–32)
Calcium: 9.5 mg/dL (ref 8.4–10.5)
Chloride: 101 mEq/L (ref 96–112)
Creatinine, Ser: 1.49 mg/dL (ref 0.40–1.50)
GFR: 50.57 mL/min — ABNORMAL LOW (ref >60.00)
Glucose, Bld: 146 mg/dL — ABNORMAL HIGH (ref 70–99)
Potassium: 4.4 mEq/L (ref 3.5–5.1)
Sodium: 139 mEq/L (ref 135–145)
Total Bilirubin: 0.4 mg/dL (ref 0.2–1.2)
Total Protein: 7.3 g/dL (ref 6.0–8.3)

## 2021-12-15 LAB — PSA: PSA: 4.81 ng/mL — ABNORMAL HIGH (ref 0.10–4.00)

## 2021-12-16 ENCOUNTER — Other Ambulatory Visit: Payer: Self-pay | Admitting: Emergency Medicine

## 2021-12-16 DIAGNOSIS — R972 Elevated prostate specific antigen [PSA]: Secondary | ICD-10-CM

## 2021-12-16 DIAGNOSIS — N289 Disorder of kidney and ureter, unspecified: Secondary | ICD-10-CM

## 2021-12-21 NOTE — Telephone Encounter (Signed)
NA

## 2021-12-21 NOTE — Telephone Encounter (Signed)
Johnny Andrews from [facility] called and said that they need a prior authorization on the lift chair - please send to [facility] - fax # [phone] ?

## 2021-12-23 NOTE — Telephone Encounter (Signed)
Called patient to clarify as to what Mcarthur Rossetti is needing as we do not do prior auth for DME that PA will need to be completed by [facility]. Patient states [employer] never received the fax of the DME order. I mentioned to the patient the DME order was faxed on 12/14/2021 will fax again today.  Patient notified  ?

## 2022-02-03 ENCOUNTER — Other Ambulatory Visit: Payer: Self-pay | Admitting: Emergency Medicine

## 2022-02-23 DIAGNOSIS — K648 Other hemorrhoids: Secondary | ICD-10-CM | POA: Diagnosis not present

## 2022-02-23 DIAGNOSIS — Z1211 Encounter for screening for malignant neoplasm of colon: Secondary | ICD-10-CM | POA: Diagnosis not present

## 2022-02-23 DIAGNOSIS — Z91199 Patient's noncompliance with other medical treatment and regimen due to unspecified reason: Secondary | ICD-10-CM | POA: Diagnosis not present

## 2022-02-23 DIAGNOSIS — K5730 Diverticulosis of large intestine without perforation or abscess without bleeding: Secondary | ICD-10-CM | POA: Diagnosis not present

## 2022-02-23 DIAGNOSIS — Z86010 Personal history of colonic polyps: Secondary | ICD-10-CM | POA: Diagnosis not present

## 2022-02-23 DIAGNOSIS — E119 Type 2 diabetes mellitus without complications: Secondary | ICD-10-CM | POA: Diagnosis not present

## 2022-02-23 DIAGNOSIS — I10 Essential (primary) hypertension: Secondary | ICD-10-CM | POA: Diagnosis not present

## 2022-02-23 DIAGNOSIS — K649 Unspecified hemorrhoids: Secondary | ICD-10-CM | POA: Diagnosis not present

## 2022-02-23 DIAGNOSIS — G4733 Obstructive sleep apnea (adult) (pediatric): Secondary | ICD-10-CM | POA: Diagnosis not present

## 2022-02-23 DIAGNOSIS — E039 Hypothyroidism, unspecified: Secondary | ICD-10-CM | POA: Diagnosis not present

## 2022-04-01 DIAGNOSIS — E1122 Type 2 diabetes mellitus with diabetic chronic kidney disease: Secondary | ICD-10-CM | POA: Diagnosis not present

## 2022-04-01 DIAGNOSIS — E039 Hypothyroidism, unspecified: Secondary | ICD-10-CM | POA: Diagnosis not present

## 2022-04-01 DIAGNOSIS — I129 Hypertensive chronic kidney disease with stage 1 through stage 4 chronic kidney disease, or unspecified chronic kidney disease: Secondary | ICD-10-CM | POA: Diagnosis not present

## 2022-04-01 DIAGNOSIS — E785 Hyperlipidemia, unspecified: Secondary | ICD-10-CM | POA: Diagnosis not present

## 2022-04-01 DIAGNOSIS — E669 Obesity, unspecified: Secondary | ICD-10-CM | POA: Diagnosis not present

## 2022-04-01 DIAGNOSIS — N1831 Chronic kidney disease, stage 3a: Secondary | ICD-10-CM | POA: Diagnosis not present

## 2022-04-02 ENCOUNTER — Other Ambulatory Visit: Payer: Self-pay | Admitting: Internal Medicine

## 2022-04-02 DIAGNOSIS — I129 Hypertensive chronic kidney disease with stage 1 through stage 4 chronic kidney disease, or unspecified chronic kidney disease: Secondary | ICD-10-CM

## 2022-04-02 DIAGNOSIS — N1831 Chronic kidney disease, stage 3a: Secondary | ICD-10-CM

## 2022-04-06 ENCOUNTER — Telehealth: Payer: Self-pay

## 2022-04-06 DIAGNOSIS — E11319 Type 2 diabetes mellitus with unspecified diabetic retinopathy without macular edema: Secondary | ICD-10-CM | POA: Diagnosis not present

## 2022-04-06 LAB — HM DIABETES EYE EXAM

## 2022-04-06 NOTE — Telephone Encounter (Signed)
Pt is asking that a letter be generated stating that its medically impossible for him to go to jury duty on 04/26/22.  Pt states that his weight of 420lb, neuropathy, and arthritis of the knees are all factors in which he feels he isn't a good candidate for [employer] duty.  Pt is requesting a call when letter is available for pick [phone].  Please advise

## 2022-04-07 ENCOUNTER — Encounter: Payer: Self-pay | Admitting: *Deleted

## 2022-04-07 NOTE — Telephone Encounter (Signed)
Called patient to inform him that his letter was ready for pick up. Letter at front desk. Unable to leave patient message, phone kept ringing no VM  Sent message via [employer]

## 2022-04-07 NOTE — Telephone Encounter (Signed)
Called patient and informed patient that his letter for jury duty is awaiting a signature once signed by provider will contact patient to pick up letter.

## 2022-04-07 NOTE — Telephone Encounter (Signed)
Johnny Andrews is disabled for many reasons.  I agree with inability to perform jury duty.  Thanks.

## 2022-04-27 ENCOUNTER — Ambulatory Visit
Admission: RE | Admit: 2022-04-27 | Discharge: 2022-04-27 | Disposition: A | Discharge status: Home / Self Care | Payer: Medicare PPO | Source: Ambulatory Visit | Attending: Internal Medicine | Admitting: Internal Medicine

## 2022-04-27 DIAGNOSIS — I129 Hypertensive chronic kidney disease with stage 1 through stage 4 chronic kidney disease, or unspecified chronic kidney disease: Secondary | ICD-10-CM

## 2022-04-27 DIAGNOSIS — N189 Chronic kidney disease, unspecified: Secondary | ICD-10-CM | POA: Diagnosis not present

## 2022-04-27 DIAGNOSIS — N1831 Chronic kidney disease, stage 3a: Secondary | ICD-10-CM

## 2022-04-29 DIAGNOSIS — M5451 Vertebrogenic low back pain: Secondary | ICD-10-CM | POA: Diagnosis not present

## 2022-04-29 DIAGNOSIS — M25552 Pain in left hip: Secondary | ICD-10-CM | POA: Diagnosis not present

## 2022-04-29 DIAGNOSIS — M9903 Segmental and somatic dysfunction of lumbar region: Secondary | ICD-10-CM | POA: Diagnosis not present

## 2022-04-29 DIAGNOSIS — M9901 Segmental and somatic dysfunction of cervical region: Secondary | ICD-10-CM | POA: Diagnosis not present

## 2022-05-03 DIAGNOSIS — M9901 Segmental and somatic dysfunction of cervical region: Secondary | ICD-10-CM | POA: Diagnosis not present

## 2022-05-03 DIAGNOSIS — M25552 Pain in left hip: Secondary | ICD-10-CM | POA: Diagnosis not present

## 2022-05-03 DIAGNOSIS — M9903 Segmental and somatic dysfunction of lumbar region: Secondary | ICD-10-CM | POA: Diagnosis not present

## 2022-05-03 DIAGNOSIS — M5451 Vertebrogenic low back pain: Secondary | ICD-10-CM | POA: Diagnosis not present

## 2022-05-04 DIAGNOSIS — M5451 Vertebrogenic low back pain: Secondary | ICD-10-CM | POA: Diagnosis not present

## 2022-05-04 DIAGNOSIS — M25552 Pain in left hip: Secondary | ICD-10-CM | POA: Diagnosis not present

## 2022-05-04 DIAGNOSIS — M9901 Segmental and somatic dysfunction of cervical region: Secondary | ICD-10-CM | POA: Diagnosis not present

## 2022-05-04 DIAGNOSIS — M9903 Segmental and somatic dysfunction of lumbar region: Secondary | ICD-10-CM | POA: Diagnosis not present

## 2022-05-06 DIAGNOSIS — M9901 Segmental and somatic dysfunction of cervical region: Secondary | ICD-10-CM | POA: Diagnosis not present

## 2022-05-06 DIAGNOSIS — M9903 Segmental and somatic dysfunction of lumbar region: Secondary | ICD-10-CM | POA: Diagnosis not present

## 2022-05-06 DIAGNOSIS — M5451 Vertebrogenic low back pain: Secondary | ICD-10-CM | POA: Diagnosis not present

## 2022-05-06 DIAGNOSIS — M25552 Pain in left hip: Secondary | ICD-10-CM | POA: Diagnosis not present

## 2022-05-10 DIAGNOSIS — M9903 Segmental and somatic dysfunction of lumbar region: Secondary | ICD-10-CM | POA: Diagnosis not present

## 2022-05-10 DIAGNOSIS — M9901 Segmental and somatic dysfunction of cervical region: Secondary | ICD-10-CM | POA: Diagnosis not present

## 2022-05-10 DIAGNOSIS — M25552 Pain in left hip: Secondary | ICD-10-CM | POA: Diagnosis not present

## 2022-05-10 DIAGNOSIS — M5451 Vertebrogenic low back pain: Secondary | ICD-10-CM | POA: Diagnosis not present

## 2022-05-11 DIAGNOSIS — M25552 Pain in left hip: Secondary | ICD-10-CM | POA: Diagnosis not present

## 2022-05-11 DIAGNOSIS — M5451 Vertebrogenic low back pain: Secondary | ICD-10-CM | POA: Diagnosis not present

## 2022-05-11 DIAGNOSIS — M9901 Segmental and somatic dysfunction of cervical region: Secondary | ICD-10-CM | POA: Diagnosis not present

## 2022-05-11 DIAGNOSIS — M9903 Segmental and somatic dysfunction of lumbar region: Secondary | ICD-10-CM | POA: Diagnosis not present

## 2022-05-13 DIAGNOSIS — M9901 Segmental and somatic dysfunction of cervical region: Secondary | ICD-10-CM | POA: Diagnosis not present

## 2022-05-13 DIAGNOSIS — M9903 Segmental and somatic dysfunction of lumbar region: Secondary | ICD-10-CM | POA: Diagnosis not present

## 2022-05-13 DIAGNOSIS — M5451 Vertebrogenic low back pain: Secondary | ICD-10-CM | POA: Diagnosis not present

## 2022-05-13 DIAGNOSIS — M25552 Pain in left hip: Secondary | ICD-10-CM | POA: Diagnosis not present

## 2022-05-14 ENCOUNTER — Other Ambulatory Visit: Payer: Self-pay | Admitting: Emergency Medicine

## 2022-05-14 DIAGNOSIS — G6289 Other specified polyneuropathies: Secondary | ICD-10-CM

## 2022-05-14 DIAGNOSIS — G8929 Other chronic pain: Secondary | ICD-10-CM

## 2022-05-18 DIAGNOSIS — M5451 Vertebrogenic low back pain: Secondary | ICD-10-CM | POA: Diagnosis not present

## 2022-05-18 DIAGNOSIS — M9903 Segmental and somatic dysfunction of lumbar region: Secondary | ICD-10-CM | POA: Diagnosis not present

## 2022-05-18 DIAGNOSIS — M25552 Pain in left hip: Secondary | ICD-10-CM | POA: Diagnosis not present

## 2022-05-18 DIAGNOSIS — M9901 Segmental and somatic dysfunction of cervical region: Secondary | ICD-10-CM | POA: Diagnosis not present

## 2022-05-21 DIAGNOSIS — M9903 Segmental and somatic dysfunction of lumbar region: Secondary | ICD-10-CM | POA: Diagnosis not present

## 2022-05-21 DIAGNOSIS — M5451 Vertebrogenic low back pain: Secondary | ICD-10-CM | POA: Diagnosis not present

## 2022-05-21 DIAGNOSIS — E781 Pure hyperglyceridemia: Secondary | ICD-10-CM | POA: Diagnosis not present

## 2022-05-21 DIAGNOSIS — M9901 Segmental and somatic dysfunction of cervical region: Secondary | ICD-10-CM | POA: Diagnosis not present

## 2022-05-21 DIAGNOSIS — E039 Hypothyroidism, unspecified: Secondary | ICD-10-CM | POA: Diagnosis not present

## 2022-05-21 DIAGNOSIS — M25552 Pain in left hip: Secondary | ICD-10-CM | POA: Diagnosis not present

## 2022-05-21 DIAGNOSIS — E1165 Type 2 diabetes mellitus with hyperglycemia: Secondary | ICD-10-CM | POA: Diagnosis not present

## 2022-05-24 DIAGNOSIS — M25552 Pain in left hip: Secondary | ICD-10-CM | POA: Diagnosis not present

## 2022-05-24 DIAGNOSIS — M9901 Segmental and somatic dysfunction of cervical region: Secondary | ICD-10-CM | POA: Diagnosis not present

## 2022-05-24 DIAGNOSIS — M5451 Vertebrogenic low back pain: Secondary | ICD-10-CM | POA: Diagnosis not present

## 2022-05-24 DIAGNOSIS — M9903 Segmental and somatic dysfunction of lumbar region: Secondary | ICD-10-CM | POA: Diagnosis not present

## 2022-05-25 DIAGNOSIS — M9901 Segmental and somatic dysfunction of cervical region: Secondary | ICD-10-CM | POA: Diagnosis not present

## 2022-05-25 DIAGNOSIS — M9903 Segmental and somatic dysfunction of lumbar region: Secondary | ICD-10-CM | POA: Diagnosis not present

## 2022-05-25 DIAGNOSIS — M5451 Vertebrogenic low back pain: Secondary | ICD-10-CM | POA: Diagnosis not present

## 2022-05-25 DIAGNOSIS — M25552 Pain in left hip: Secondary | ICD-10-CM | POA: Diagnosis not present

## 2022-05-27 DIAGNOSIS — M25552 Pain in left hip: Secondary | ICD-10-CM | POA: Diagnosis not present

## 2022-05-27 DIAGNOSIS — M9901 Segmental and somatic dysfunction of cervical region: Secondary | ICD-10-CM | POA: Diagnosis not present

## 2022-05-27 DIAGNOSIS — M9903 Segmental and somatic dysfunction of lumbar region: Secondary | ICD-10-CM | POA: Diagnosis not present

## 2022-05-27 DIAGNOSIS — M5451 Vertebrogenic low back pain: Secondary | ICD-10-CM | POA: Diagnosis not present

## 2022-05-28 DIAGNOSIS — E039 Hypothyroidism, unspecified: Secondary | ICD-10-CM | POA: Diagnosis not present

## 2022-05-28 DIAGNOSIS — E1165 Type 2 diabetes mellitus with hyperglycemia: Secondary | ICD-10-CM | POA: Diagnosis not present

## 2022-05-28 DIAGNOSIS — N189 Chronic kidney disease, unspecified: Secondary | ICD-10-CM | POA: Diagnosis not present

## 2022-05-28 DIAGNOSIS — E7800 Pure hypercholesterolemia, unspecified: Secondary | ICD-10-CM | POA: Diagnosis not present

## 2022-05-28 DIAGNOSIS — E1140 Type 2 diabetes mellitus with diabetic neuropathy, unspecified: Secondary | ICD-10-CM | POA: Diagnosis not present

## 2022-05-28 DIAGNOSIS — E781 Pure hyperglyceridemia: Secondary | ICD-10-CM | POA: Diagnosis not present

## 2022-05-28 DIAGNOSIS — I10 Essential (primary) hypertension: Secondary | ICD-10-CM | POA: Diagnosis not present

## 2022-06-01 DIAGNOSIS — M25552 Pain in left hip: Secondary | ICD-10-CM | POA: Diagnosis not present

## 2022-06-01 DIAGNOSIS — M5451 Vertebrogenic low back pain: Secondary | ICD-10-CM | POA: Diagnosis not present

## 2022-06-01 DIAGNOSIS — M9901 Segmental and somatic dysfunction of cervical region: Secondary | ICD-10-CM | POA: Diagnosis not present

## 2022-06-01 DIAGNOSIS — M9903 Segmental and somatic dysfunction of lumbar region: Secondary | ICD-10-CM | POA: Diagnosis not present

## 2022-06-03 ENCOUNTER — Encounter: Payer: Self-pay | Admitting: Emergency Medicine

## 2022-06-03 ENCOUNTER — Ambulatory Visit: Payer: Medicare PPO | Admitting: Emergency Medicine

## 2022-06-03 VITALS — BP 138/70 | HR 76 | Temp 98.0°F | Ht 77.0 in | Wt >= 6400 oz

## 2022-06-03 DIAGNOSIS — M5451 Vertebrogenic low back pain: Secondary | ICD-10-CM | POA: Diagnosis not present

## 2022-06-03 DIAGNOSIS — M9901 Segmental and somatic dysfunction of cervical region: Secondary | ICD-10-CM | POA: Diagnosis not present

## 2022-06-03 DIAGNOSIS — E034 Atrophy of thyroid (acquired): Secondary | ICD-10-CM

## 2022-06-03 DIAGNOSIS — Z23 Encounter for immunization: Secondary | ICD-10-CM | POA: Diagnosis not present

## 2022-06-03 DIAGNOSIS — E1159 Type 2 diabetes mellitus with other circulatory complications: Secondary | ICD-10-CM | POA: Diagnosis not present

## 2022-06-03 DIAGNOSIS — G4730 Sleep apnea, unspecified: Secondary | ICD-10-CM

## 2022-06-03 DIAGNOSIS — M9903 Segmental and somatic dysfunction of lumbar region: Secondary | ICD-10-CM | POA: Diagnosis not present

## 2022-06-03 DIAGNOSIS — M159 Polyosteoarthritis, unspecified: Secondary | ICD-10-CM | POA: Diagnosis not present

## 2022-06-03 DIAGNOSIS — M150 Primary generalized (osteo)arthritis: Secondary | ICD-10-CM

## 2022-06-03 DIAGNOSIS — J984 Other disorders of lung: Secondary | ICD-10-CM | POA: Diagnosis not present

## 2022-06-03 DIAGNOSIS — E785 Hyperlipidemia, unspecified: Secondary | ICD-10-CM

## 2022-06-03 DIAGNOSIS — E1169 Type 2 diabetes mellitus with other specified complication: Secondary | ICD-10-CM | POA: Diagnosis not present

## 2022-06-03 DIAGNOSIS — I872 Venous insufficiency (chronic) (peripheral): Secondary | ICD-10-CM | POA: Diagnosis not present

## 2022-06-03 DIAGNOSIS — E1142 Type 2 diabetes mellitus with diabetic polyneuropathy: Secondary | ICD-10-CM | POA: Diagnosis not present

## 2022-06-03 DIAGNOSIS — I152 Hypertension secondary to endocrine disorders: Secondary | ICD-10-CM

## 2022-06-03 DIAGNOSIS — M51369 Other intervertebral disc degeneration, lumbar region without mention of lumbar back pain or lower extremity pain: Secondary | ICD-10-CM

## 2022-06-03 DIAGNOSIS — R972 Elevated prostate specific antigen [PSA]: Secondary | ICD-10-CM

## 2022-06-03 DIAGNOSIS — M48062 Spinal stenosis, lumbar region with neurogenic claudication: Secondary | ICD-10-CM

## 2022-06-03 DIAGNOSIS — M25552 Pain in left hip: Secondary | ICD-10-CM | POA: Diagnosis not present

## 2022-06-03 DIAGNOSIS — M5136 Other intervertebral disc degeneration, lumbar region: Secondary | ICD-10-CM

## 2022-06-03 NOTE — Assessment & Plan Note (Signed)
Well-controlled hypertension Continue Hyzaar 100-12.5 mg daily   BP Readings from Last 3 Encounters:  06/03/22 138/70  12/03/21 134/72  06/01/21 118/72   Uncontrolled diabetes with most recent hemoglobin A1c at 7.7. Diabetes managed by endocrinologist. Diet and nutrition discussed. Cardiovascular risks associated with diabetes and hypertension discussed.

## 2022-06-03 NOTE — Progress Notes (Signed)
Lab Results  Component Value Date   HGBA1C 6.5 (A) 12/03/2021   BP Readings from Last 3 Encounters:  12/03/21 134/72  06/01/21 118/72  05/28/21 118/70   Lab Results  Component Value Date   CHOL 216 (H) 12/15/2021   HDL 45.20 12/15/2021   LDLCALC 83 07/08/2020   LDLDIRECT 119.0 12/15/2021   TRIG (H) 12/15/2021    521.0 Triglyceride is over 400; calculations on Lipids are invalid.   CHOLHDL 5 12/15/2021   Wt Readings from Last 3 Encounters:  06/03/22 (!) 435 lb (197.3 kg)  12/03/21 (!) 420 lb (190.5 kg)  05/28/21 (!) 429 lb (194.6 kg)   Deirdre Peer 80 y.o.   Chief Complaint  Patient presents with   Follow-up    6 mnth f/u appt  shoulder pain , hip pin, slight dizziness     HISTORY OF PRESENT ILLNESS: This is a 61 y.o. male here for follow-up of multiple chronic medical problems. Recently seen by endocrinologist.  Hemoglobin A1c is 7.7. Blood work during last visit showed increased PSA.  Referred to urology but not scheduled yet. It also showed diminished GFR.  Was seen by nephrologist.  HPI   Prior to Admission medications   Medication Sig Start Date End Date Taking? Authorizing Provider  ALPRAZolam Duanne Moron) 0.5 MG tablet Take 1 tablet (0.5 mg total) by mouth 2 (two) times daily as needed for anxiety. 12/03/21  Yes Horald Pollen, MD  albuterol (VENTOLIN HFA) 108 (90 Base) MCG/ACT inhaler TAKE 2 PUFFS BY MOUTH TWICE A DAY Patient not taking: Reported on 06/03/2022 09/12/20   Horald Pollen, MD  blood glucose meter kit and supplies 1 each by Other route 2 (two) times daily. 03/14/20   Horald Pollen, MD  diclofenac (VOLTAREN) 75 MG EC tablet TAKE 1 TABLET BY MOUTH TWICE A DAY 05/15/22   Horald Pollen, MD  empagliflozin (JARDIANCE) 25 MG TABS tablet Take 25 mg by mouth daily.    [provider]  Fluticasone-Umeclidin-Vilant (TRELEGY ELLIPTA) 100-62.5-25 MCG/INH AEPB Inhale 1 puff into the lungs daily. 10/22/20   [place], Cicero Duck A,  MD  gabapentin (NEURONTIN) 300 MG capsule Take 300 mg by mouth daily. 08/20/20   [provider]  glucose blood (ONE TOUCH ULTRA TEST) test strip Two times a day for ULTRA 2 ONE TOUCH 02/28/20   Horald Pollen, MD  insulin aspart (NOVOLOG) 100 UNIT/ML FlexPen Inject 75 Units into the skin 2 (two) times daily before a meal. 30 units am and 50 units pm    [provider]  Insulin Pen Needle (BD ULTRA-FINE PEN NEEDLES) 29G X 12.7MM MISC USE TWO CHECK BLOOD SUGAR TWICE A DAY. APPOINTMENT NEEDED FOR FURTHER REFILLS 02/28/20   Horald Pollen, MD  levothyroxine (SYNTHROID) 137 MCG tablet Take 2 tablets (274 mcg total) by mouth daily before breakfast. 03/04/20 06/09/20  Horald Pollen, MD  losartan-hydrochlorothiazide [facility]) 100-12.5 MG tablet Take 1 tablet by mouth daily. 08/28/20   Horald Pollen, MD  meclizine (ANTIVERT) 50 MG tablet Take 1 tablet (50 mg total) by mouth 3 (three) times daily as needed. Patient not taking: No sig reported 06/09/20   Horald Pollen, MD  metFORMIN (GLUCOPHAGE-XR) 500 MG 24 hr tablet Take 1,000 mg by mouth 2 (two) times daily.  09/15/15   [provider]  Respiratory Therapy Supplies (ADULT MASK) MISC 2 Devices by Does not apply route once. 11/24/11   Rigoberto Noel, MD  simvastatin (ZOCOR) 20 MG tablet  Take 20 mg by mouth daily.    [provider]  topiramate (TOPAMAX) 50 MG tablet TAKE 2 TABLETS BY MOUTH AT BEDTIME 05/15/22   Horald Pollen, MD  traZODone (DESYREL) 100 MG tablet TAKE 1 TABLET BY MOUTH EVERYDAY AT BEDTIME 02/03/22   Horald Pollen, MD  TRESIBA FLEXTOUCH 200 UNIT/ML SOPN INJECT 100 UNITS TWICE A DAY 12/17/16   [provider]    Allergies  Allergen Reactions   Other     Dust mites    Patient Active Problem List   Diagnosis Date Noted   Plantar fasciitis, bilateral 06/01/2021   Heel pain, bilateral 05/30/2021   Claudication ([place]) 07/08/2020   Chronic venous  insufficiency 07/25/2019   Encounter for orthopedic follow-up care 08/14/2018   Carpal tunnel syndrome of right wrist 04/24/2018   Diabetic peripheral neuropathy ([place]) 04/24/2018   Morbid obesity ([place]) 01/27/2018   Osteoarthritis of left knee 12/16/2017   Osteoarthritis of right knee 12/16/2017   Family history of prostate cancer 03/06/2017   Pure hypercholesterolemia 09/13/2016   Lumbar degenerative disc disease 07/22/2016   Spinal stenosis of lumbar region 11/03/2015   Abnormality of gait 09/15/2015   Restrictive lung disease 05/05/2015   Impotence of organic origin 02/05/2013   Hypogonadism male 02/05/2013   Elevated PSA 11/30/2011   OTHER DISEASES OF LUNG NOT ELSEWHERE CLASSIFIED 07/20/2010   GOUT 04/17/2010   SMOKER 04/17/2010   Sleep apnea 08/22/2009   Hypothyroidism 09/19/2007   Dyslipidemia associated with type 2 diabetes mellitus ([place]) 09/19/2007   Situational anxiety 09/19/2007   Hypertension associated with diabetes ([place]) 09/19/2007   Osteoarthritis 09/19/2007    Past Medical History:  Diagnosis Date   ALCOHOL USE 10/24/2008   ANXIETY 09/19/2007   Cough 05/22/2008   Diabetes mellitus without complication ([place])    Phreesia 06/02/2020   DIABETES MELLITUS, TYPE II 09/19/2007   External thrombosed hemorrhoids 08/23/2008   GOUT 04/17/2010   HYPERLIPIDEMIA 09/19/2007   HYPERTENSION 09/19/2007   Hypertension    Phreesia 06/02/2020   HYPOTHYROIDISM 09/19/2007   Leg pain    OSTEOARTHRITIS 09/19/2007   OTHER DISEASES OF LUNG NOT ELSEWHERE CLASSIFIED 07/20/2010   Rash and other nonspecific skin eruption 09/19/2007   Restrictive lung disease    SCROTAL ABSCESS 05/27/2010   Sebaceous cyst 08/01/2008   SLEEP APNEA 08/22/2009   Sleep apnea    Phreesia 06/02/2020   SMOKER 04/17/2010   Thyroid disease    Phreesia 06/02/2020    Past Surgical History:  Procedure Laterality Date   COLONOSCOPY     NASAL SEPTUM SURGERY      Social History   Socioeconomic History   Marital status:  Married    Spouse name: Not on file   Number of children: 2   Years of education: BS   Highest education level: Not on file  Occupational History   Occupation: Multimedia programmer: [employer]  Tobacco Use   Smoking status: Former    Packs/day: 0.50    Years: 30.00    Total pack years: 15.00    Types: Cigarettes    Quit date: 2008    Years since quitting: 15.8   Smokeless tobacco: Never   Tobacco comments:    30 years at most off and on smoking 03/14/15  Substance and Sexual Activity   Alcohol use: Yes    Alcohol/week: 0.0 standard drinks of alcohol    Comment: 2 drinks per day   Drug use: Yes    Comment: Occasional  Marijuana use   Sexual activity: Not on file  Other Topics Concern   Not on file  Social History Narrative   Originally from [place]. Has lived in [place], [place], [place], & [place] since 1993. 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 Currently has a dog & cats. Previously had a parrot (conure) 12 years ago for 1 year but in same house.  Has also had excessive exposure to pigeon feces. He has had very brief exposure to asbestos around 2000. No hot tub exposure. He has inhaled chemical fumes/gases/refridgerants through his work.      Marital status: married x 26 years      Children: 2 children (19, 7); no grandchildren      Lives: Lives at home with his wife      Employment: [employer]      Tobacco: none; quit in 2013; smoked x 1/2 ppd x 20 years      Alcohol:  10 drinks; usually 2 drinks per day and then skip two days.      Drugs:  None      Exercise: none; job is physically demanding      Right-handed.   1 cup caffeine daily.   Social Determinants of Health   Financial Resource Strain: Not on file  Food Insecurity: Not on file  Transportation Needs: Not on file  Physical Activity: Not on file  Stress: Not on file  Social Connections: Not on file  Intimate Partner Violence: Not on file    Family  History  Problem Relation Age of Onset   Breast cancer Mother        Breast Cancer   Parkinson's disease Mother    Heart disease Father        CABG, stenting cardiac   Cancer Brother 58       prostate cancer   Lung disease Neg Hx    Autoimmune disease Neg Hx      Review of Systems  Constitutional: Negative.  Negative for chills and fever.  HENT: Negative.  Negative for congestion and sore throat.   Respiratory: Negative.  Negative for cough and shortness of breath.   Cardiovascular: Negative.   Gastrointestinal: Negative.  Negative for abdominal pain, nausea and vomiting.  Genitourinary: Negative.  Negative for dysuria and hematuria.  Musculoskeletal:  Positive for back pain and joint pain.  Skin: Negative.  Negative for rash.  Neurological: Negative.  Negative for dizziness and headaches.  All other systems reviewed and are negative.  Today's Vitals   06/03/22 1323 06/03/22 1332 06/03/22 1345  BP: (!) 142/80 (!) 140/78 138/70  Pulse: 76    Temp: 98 F (36.7 C)    TempSrc: Oral    SpO2: 91%    Weight: (!) 435 lb (197.3 kg)    Height: 6' 5" (1.956 m)     Body mass index is 51.58 kg/m.   Physical Exam Vitals reviewed.  Constitutional:      Appearance: Normal appearance. He is obese.  HENT:     Head: Normocephalic.  Eyes:     Extraocular Movements: Extraocular movements intact.     Pupils: Pupils are equal, round, and reactive to light.  Cardiovascular:     Rate and Rhythm: Normal rate and regular rhythm.  Pulmonary:     Effort: Pulmonary effort is normal.  Skin:    General: Skin is warm and dry.  Neurological:  Mental Status: He is alert and oriented to person, place, and time.  Psychiatric:        Mood and Affect: Mood normal.        Behavior: Behavior normal.      ASSESSMENT & PLAN: A total of 49 minutes was spent with the patient and counseling/coordination of care regarding preparing for this visit, review of most recent office visit notes,  review of most recent blood work results, review of multiple chronic medical problems under management, review of all medications, education on nutrition, cardiovascular risks associated with hypertension and diabetes, prognosis, documentation, need for follow-up.  Problem List Items Addressed This Visit       Cardiovascular and Mediastinum   Hypertension associated with diabetes ([place]) - Primary    Well-controlled hypertension Continue Hyzaar 100-12.5 mg daily   BP Readings from Last 3 Encounters:  06/03/22 138/70  12/03/21 134/72  06/01/21 118/72   Uncontrolled diabetes with most recent hemoglobin A1c at 7.7. Diabetes managed by endocrinologist. Diet and nutrition discussed. Cardiovascular risks associated with diabetes and hypertension discussed.       Chronic venous insufficiency     Respiratory   Sleep apnea   Restrictive lung disease     Endocrine   Hypothyroidism    Hypothyroidism medication managed by endocrinologist       Dyslipidemia associated with type 2 diabetes mellitus ([place])    Diet and nutrition discussed.  Continue simvastatin 40 mg daily.      Diabetic peripheral neuropathy (H[facility])     Musculoskeletal and Integument   Osteoarthritis   Lumbar degenerative disc disease    With spinal stenosis and neurogenic claudication. Continue gabapentin 300 mg daily and diclofenac as needed        Other   Elevated PSA    Was referred to urology last May but nothing scheduled yet.  We will look into this.      Spinal stenosis of lumbar region   Morbid obesity ([place])    Diet and nutrition discussed. Advised to decrease amount of daily carbohydrate intake and daily calories and increase amount of plant based protein in his diet.      Other Visit Diagnoses     Need for vaccination       Relevant Orders   Flu Vaccine QUAD 6+ mos PF IM (Fluarix Quad PF) (Completed)      Patient Instructions  Health Maintenance, Male Adopting a healthy lifestyle and  getting preventive care are important in promoting health and wellness. Ask your health care provider about: The right schedule for you to have regular tests and exams. Things you can do on your own to prevent diseases and keep yourself healthy. What should I know about diet, weight, and exercise? Eat a healthy diet  Eat a diet that includes plenty of vegetables, fruits, low-fat dairy products, and lean protein. Do not eat a lot of foods that are high in solid fats, added sugars, or sodium. Maintain a healthy weight Body mass index (BMI) is a measurement that can be used to identify possible weight problems. It estimates body fat based on height and weight. Your health care provider can help determine your BMI and help you achieve or maintain a healthy weight. Get regular exercise Get regular exercise. This is one of the most important things you can do for your health. Most adults should: Exercise for at least 150 minutes each week. The exercise should increase your heart rate and make you sweat (moderate-intensity exercise). Do strengthening  exercises at least twice a week. This is in addition to the moderate-intensity exercise. Spend less time sitting. Even light physical activity can be beneficial. Watch cholesterol and blood lipids Have your blood tested for lipids and cholesterol at 61 years of age, then have this test every 5 years. You may need to have your cholesterol levels checked more often if: Your lipid or cholesterol levels are high. You are older than 61 years of age. You are at high risk for heart disease. What should I know about cancer screening? Many types of cancers can be detected early and may often be prevented. Depending on your health history and family history, you may need to have cancer screening at various ages. This may include screening for: Colorectal cancer. Prostate cancer. Skin cancer. Lung cancer. What should I know about heart disease, diabetes, and  high blood pressure? Blood pressure and heart disease High blood pressure causes heart disease and increases the risk of stroke. This is more likely to develop in people who have high blood pressure readings or are overweight. Talk with your health care provider about your target blood pressure readings. Have your blood pressure checked: Every 3-5 years if you are 63-57 years of age. Every year if you are 21 years old or older. If you are between the ages of 67 and 16 and are a current or former smoker, ask your health care provider if you should have a one-time screening for abdominal aortic aneurysm (AAA). Diabetes Have regular diabetes screenings. This checks your fasting blood sugar level. Have the screening done: Once every three years after age 61 if you are at a normal weight and have a low risk for diabetes. More often and at a younger age if you are overweight or have a high risk for diabetes. What should I know about preventing infection? Hepatitis B If you have a higher risk for hepatitis B, you should be screened for this virus. Talk with your health care provider to find out if you are at risk for hepatitis B infection. Hepatitis C Blood testing is recommended for: Everyone born from [birth year] through 1965. Anyone with known risk factors for hepatitis C. Sexually transmitted infections (STIs) You should be screened each year for STIs, including gonorrhea and chlamydia, if: You are sexually active and are younger than 61 years of age. You are older than 61 years of age and your health care provider tells you that you are at risk for this type of infection. Your sexual activity has changed since you were last screened, and you are at increased risk for chlamydia or gonorrhea. Ask your health care provider if you are at risk. Ask your health care provider about whether you are at high risk for HIV. Your health care provider may recommend a prescription medicine to help prevent HIV  infection. If you choose to take medicine to prevent HIV, you should first get tested for HIV. You should then be tested every 3 months for as long as you are taking the medicine. Follow these instructions at home: Alcohol use Do not drink alcohol if your health care provider tells you not to drink. If you drink alcohol: Limit how much you have to 0-2 drinks a day. Know how much alcohol is in your drink. In the [place] one drink equals one 12 oz bottle of beer (355 mL), one 5 oz glass of wine (148 mL), or one 1 oz glass of hard liquor (44 mL). Lifestyle Do not use any products  that contain nicotine or tobacco. These products include cigarettes, chewing tobacco, and vaping devices, such as e-cigarettes. If you need help quitting, ask your health care provider. Do not use street drugs. Do not share needles. Ask your health care provider for help if you need support or information about quitting drugs. General instructions Schedule regular health, dental, and eye exams. Stay current with your vaccines. Tell your health care provider if: You often feel depressed. You have ever been abused or do not feel safe at home. Summary Adopting a healthy lifestyle and getting preventive care are important in promoting health and wellness. Follow your health care provider's instructions about healthy diet, exercising, and getting tested or screened for diseases. Follow your health care provider's instructions on monitoring your cholesterol and blood pressure. This information is not intended to replace advice given to you by your health care provider. Make sure you discuss any questions you have with your health care provider. Document Revised: 12/22/2020 Document Reviewed: 12/22/2020 Elsevier Patient Education  [place], MD [facility]

## 2022-06-03 NOTE — Assessment & Plan Note (Signed)
Hypothyroidism medication managed by endocrinologist

## 2022-06-03 NOTE — Assessment & Plan Note (Signed)
Diet and nutrition discussed.  Continue simvastatin 40 mg daily. 

## 2022-06-03 NOTE — Assessment & Plan Note (Signed)
With spinal stenosis and neurogenic claudication. Continue gabapentin 300 mg daily and diclofenac as needed

## 2022-06-03 NOTE — Assessment & Plan Note (Signed)
Diet and nutrition discussed.  Advised to decrease amount of daily carbohydrate intake and daily calories and increase amount of plant based protein in his diet 

## 2022-06-03 NOTE — Assessment & Plan Note (Signed)
Was referred to urology last May but nothing scheduled yet.  We will look into this.

## 2022-06-03 NOTE — Patient Instructions (Signed)
Health Maintenance, Male Adopting a healthy lifestyle and getting preventive care are important in promoting health and wellness. Ask your health care provider about: The right schedule for you to have regular tests and exams. Things you can do on your own to prevent diseases and keep yourself healthy. What should I know about diet, weight, and exercise? Eat a healthy diet  Eat a diet that includes plenty of vegetables, fruits, low-fat dairy products, and lean protein. Do not eat a lot of foods that are high in solid fats, added sugars, or sodium. Maintain a healthy weight Body mass index (BMI) is a measurement that can be used to identify possible weight problems. It estimates body fat based on height and weight. Your health care provider can help determine your BMI and help you achieve or maintain a healthy weight. Get regular exercise Get regular exercise. This is one of the most important things you can do for your health. Most adults should: Exercise for at least 150 minutes each week. The exercise should increase your heart rate and make you sweat (moderate-intensity exercise). Do strengthening exercises at least twice a week. This is in addition to the moderate-intensity exercise. Spend less time sitting. Even light physical activity can be beneficial. Watch cholesterol and blood lipids Have your blood tested for lipids and cholesterol at 61 years of age, then have this test every 5 years. You may need to have your cholesterol levels checked more often if: Your lipid or cholesterol levels are high. You are older than 61 years of age. You are at high risk for heart disease. What should I know about cancer screening? Many types of cancers can be detected early and may often be prevented. Depending on your health history and family history, you may need to have cancer screening at various ages. This may include screening for: Colorectal cancer. Prostate cancer. Skin cancer. Lung  cancer. What should I know about heart disease, diabetes, and high blood pressure? Blood pressure and heart disease High blood pressure causes heart disease and increases the risk of stroke. This is more likely to develop in people who have high blood pressure readings or are overweight. Talk with your health care provider about your target blood pressure readings. Have your blood pressure checked: Every 3-5 years if you are 18-39 years of age. Every year if you are 40 years old or older. If you are between the ages of 65 and 75 and are a current or former smoker, ask your health care provider if you should have a one-time screening for abdominal aortic aneurysm (AAA). Diabetes Have regular diabetes screenings. This checks your fasting blood sugar level. Have the screening done: Once every three years after age 45 if you are at a normal weight and have a low risk for diabetes. More often and at a younger age if you are overweight or have a high risk for diabetes. What should I know about preventing infection? Hepatitis B If you have a higher risk for hepatitis B, you should be screened for this virus. Talk with your health care provider to find out if you are at risk for hepatitis B infection. Hepatitis C Blood testing is recommended for: Everyone born from 1945 through 1965. Anyone with known risk factors for hepatitis C. Sexually transmitted infections (STIs) You should be screened each year for STIs, including gonorrhea and chlamydia, if: You are sexually active and are younger than 61 years of age. You are older than 61 years of age and your   health care provider tells you that you are at risk for this type of infection. Your sexual activity has changed since you were last screened, and you are at increased risk for chlamydia or gonorrhea. Ask your health care provider if you are at risk. Ask your health care provider about whether you are at high risk for HIV. Your health care provider  may recommend a prescription medicine to help prevent HIV infection. If you choose to take medicine to prevent HIV, you should first get tested for HIV. You should then be tested every 3 months for as long as you are taking the medicine. Follow these instructions at home: Alcohol use Do not drink alcohol if your health care provider tells you not to drink. If you drink alcohol: Limit how much you have to 0-2 drinks a day. Know how much alcohol is in your drink. In the [place] one drink equals one 12 oz bottle of beer (355 mL), one 5 oz glass of wine (148 mL), or one 1 oz glass of hard liquor (44 mL). Lifestyle Do not use any products that contain nicotine or tobacco. These products include cigarettes, chewing tobacco, and vaping devices, such as e-cigarettes. If you need help quitting, ask your health care provider. Do not use street drugs. Do not share needles. Ask your health care provider for help if you need support or information about quitting drugs. General instructions Schedule regular health, dental, and eye exams. Stay current with your vaccines. Tell your health care provider if: You often feel depressed. You have ever been abused or do not feel safe at home. Summary Adopting a healthy lifestyle and getting preventive care are important in promoting health and wellness. Follow your health care provider's instructions about healthy diet, exercising, and getting tested or screened for diseases. Follow your health care provider's instructions on monitoring your cholesterol and blood pressure. This information is not intended to replace advice given to you by your health care provider. Make sure you discuss any questions you have with your health care provider. Document Revised: 12/22/2020 Document Reviewed: 12/22/2020 Elsevier Patient Education  2023 Elsevier Inc.  

## 2022-06-08 DIAGNOSIS — M25552 Pain in left hip: Secondary | ICD-10-CM | POA: Diagnosis not present

## 2022-06-08 DIAGNOSIS — M9903 Segmental and somatic dysfunction of lumbar region: Secondary | ICD-10-CM | POA: Diagnosis not present

## 2022-06-08 DIAGNOSIS — M5451 Vertebrogenic low back pain: Secondary | ICD-10-CM | POA: Diagnosis not present

## 2022-06-08 DIAGNOSIS — M9901 Segmental and somatic dysfunction of cervical region: Secondary | ICD-10-CM | POA: Diagnosis not present

## 2022-06-10 DIAGNOSIS — M9901 Segmental and somatic dysfunction of cervical region: Secondary | ICD-10-CM | POA: Diagnosis not present

## 2022-06-10 DIAGNOSIS — M25552 Pain in left hip: Secondary | ICD-10-CM | POA: Diagnosis not present

## 2022-06-10 DIAGNOSIS — M5451 Vertebrogenic low back pain: Secondary | ICD-10-CM | POA: Diagnosis not present

## 2022-06-10 DIAGNOSIS — M9903 Segmental and somatic dysfunction of lumbar region: Secondary | ICD-10-CM | POA: Diagnosis not present

## 2022-06-15 ENCOUNTER — Ambulatory Visit (INDEPENDENT_AMBULATORY_CARE_PROVIDER_SITE_OTHER): Payer: Medicare PPO

## 2022-06-15 VITALS — Ht 77.0 in | Wt >= 6400 oz

## 2022-06-15 DIAGNOSIS — M9901 Segmental and somatic dysfunction of cervical region: Secondary | ICD-10-CM | POA: Diagnosis not present

## 2022-06-15 DIAGNOSIS — M5451 Vertebrogenic low back pain: Secondary | ICD-10-CM | POA: Diagnosis not present

## 2022-06-15 DIAGNOSIS — Z0000 Encounter for general adult medical examination without abnormal findings: Secondary | ICD-10-CM | POA: Diagnosis not present

## 2022-06-15 DIAGNOSIS — M9903 Segmental and somatic dysfunction of lumbar region: Secondary | ICD-10-CM | POA: Diagnosis not present

## 2022-06-15 DIAGNOSIS — M25552 Pain in left hip: Secondary | ICD-10-CM | POA: Diagnosis not present

## 2022-06-15 NOTE — Patient Instructions (Signed)
Johnny Andrews , Thank you for taking time to come for your Medicare Wellness Visit. I appreciate your ongoing commitment to your health goals. Please review the following plan we discussed and let me know if I can assist you in the future.   These are the goals we discussed:  Goals      Client understands the importance of follow-up with providers by attending scheduled visits.     Client will verbalize knowledge of diabetes self-management as evidenced by Hgb A1C <7 or as defined by provider.            This is a list of the screening recommended for you and due dates:  Health Maintenance  Topic Date Due   COVID-19 Vaccine (3 - Moderna series) 09/30/2020   Hemoglobin A1C  06/04/2022   Tetanus Vaccine  02/02/2023*   Zoster (Shingles) Vaccine (1 of 2) 02/02/2023*   Yearly kidney function blood test for diabetes  12/16/2022   Yearly kidney health urinalysis for diabetes  12/16/2022   Eye exam for diabetics  04/07/2023   Complete foot exam   06/01/2023   Medicare Annual Wellness Visit  06/16/2023   Colon Cancer Screening  02/24/2032   Flu Shot  Completed   Hepatitis C Screening: USPSTF Recommendation to screen - Ages 18-79 yo.  Completed   HIV Screening  Completed   HPV Vaccine  Aged Out  *Topic was postponed. The date shown is not the original due date.    Advanced directives: Yes; needs updating per patient.   Conditions/risks identified: Yes, Type II Diabetes Mellitus  Next appointment: Follow up in one year for your annual wellness visit.  Preventive Care 40-64 Years, Male Preventive care refers to lifestyle choices and visits with your health care provider that can promote health and wellness. What does preventive care include? A yearly physical exam. This is also called an annual well check. Dental exams once or twice a year. Routine eye exams. Ask your health care provider how often you should have your eyes checked. Personal lifestyle choices, including: Daily care of  your teeth and gums. Regular physical activity. Eating a healthy diet. Avoiding tobacco and drug use. Limiting alcohol use. Practicing safe sex. Taking low-dose aspirin every day starting at age 39. What happens during an annual well check? The services and screenings done by your health care provider during your annual well check will depend on your age, overall health, lifestyle risk factors, and family history of disease. Counseling  Your health care provider may ask you questions about your: Alcohol use. Tobacco use. Drug use. Emotional well-being. Home and relationship well-being. Sexual activity. Eating habits. Work and work Astron[employer]. Screening  You may have the following tests or measurements: Height, weight, and BMI. Blood pressure. Lipid and cholesterol levels. These may be checked every 5 years, or more frequently if you are over 76 years old. Skin check. Lung cancer screening. You may have this screening every year starting at age 78 if you have a 30-pack-year history of smoking and currently smoke or have quit within the past 15 years. Fecal occult blood test (FOBT) of the stool. You may have this test every year starting at age 53. Flexible sigmoidoscopy or colonoscopy. You may have a sigmoidoscopy every 5 years or a colonoscopy every 10 years starting at age 16. Prostate cancer screening. Recommendations will vary depending on your family history and other risks. Hepatitis C blood test. Hepatitis B blood test. Sexually transmitted disease (STD) testing. Diabetes screening. This  is done by checking your blood sugar (glucose) after you have not eaten for a while (fasting). You may have this done every 1-3 years. Discuss your test results, treatment options, and if necessary, the need for more tests with your health care provider. Vaccines  Your health care provider may recommend certain vaccines, such as: Influenza vaccine. This is recommended every year. Tetanus,  diphtheria, and acellular pertussis (Tdap, Td) vaccine. You may need a Td booster every 10 years. Zoster vaccine. You may need this after age 64. Pneumococcal 13-valent conjugate (PCV13) vaccine. You may need this if you have certain conditions and have not been vaccinated. Pneumococcal polysaccharide (PPSV23) vaccine. You may need one or two doses if you smoke cigarettes or if you have certain conditions. Talk to your health care provider about which screenings and vaccines you need and how often you need them. This information is not intended to replace advice given to you by your health care provider. Make sure you discuss any questions you have with your health care provider. Document Released: 08/29/2015 Document Revised: 04/21/2016 Document Reviewed: 06/03/2015 Elsevier Interactive Patient Education  2017 [place] Prevention in the Home Falls can cause injuries. They can happen to people of all ages. There are many things you can do to make your home safe and to help prevent falls. What can I do on the outside of my home? Regularly fix the edges of walkways and driveways and fix any cracks. Remove anything that might make you trip as you walk through a door, such as a raised step or threshold. Trim any bushes or trees on the path to your home. Use bright outdoor lighting. Clear any walking paths of anything that might make someone trip, such as rocks or tools. Regularly check to see if handrails are loose or broken. Make sure that both sides of any steps have handrails. Any raised decks and porches should have guardrails on the edges. Have any leaves, snow, or ice cleared regularly. Use sand or salt on walking paths during winter. Clean up any spills in your garage right away. This includes oil or grease spills. What can I do in the bathroom? Use night lights. Install grab bars by the toilet and in the tub and shower. Do not use towel bars as grab bars. Use non-skid mats or  decals in the tub or shower. If you need to sit down in the shower, use a plastic, non-slip stool. Keep the floor dry. Clean up any water that spills on the floor as soon as it happens. Remove soap buildup in the tub or shower regularly. Attach bath mats securely with double-sided non-slip rug tape. Do not have throw rugs and other things on the floor that can make you trip. What can I do in the bedroom? Use night lights. Make sure that you have a light by your bed that is easy to reach. Do not use any sheets or blankets that are too big for your bed. They should not hang down onto the floor. Have a firm chair that has side arms. You can use this for support while you get dressed. Do not have throw rugs and other things on the floor that can make you trip. What can I do in the kitchen? Clean up any spills right away. Avoid walking on wet floors. Keep items that you use a lot in easy-to-reach places. If you need to reach something above you, use a strong step stool that has a grab bar.  Keep electrical cords out of the way. Do not use floor polish or wax that makes floors slippery. If you must use wax, use non-skid floor wax. Do not have throw rugs and other things on the floor that can make you trip. What can I do with my stairs? Do not leave any items on the stairs. Make sure that there are handrails on both sides of the stairs and use them. Fix handrails that are broken or loose. Make sure that handrails are as long as the stairways. Check any carpeting to make sure that it is firmly attached to the stairs. Fix any carpet that is loose or worn. Avoid having throw rugs at the top or bottom of the stairs. If you do have throw rugs, attach them to the floor with carpet tape. Make sure that you have a light switch at the top of the stairs and the bottom of the stairs. If you do not have them, ask someone to add them for you. What else can I do to help prevent falls? Wear shoes that: Do not  have high heels. Have rubber bottoms. Are comfortable and fit you well. Are closed at the toe. Do not wear sandals. If you use a stepladder: Make sure that it is fully opened. Do not climb a closed stepladder. Make sure that both sides of the stepladder are locked into place. Ask someone to hold it for you, if possible. Clearly mark and make sure that you can see: Any grab bars or handrails. First and last steps. Where the edge of each step is. Use tools that help you move around (mobility aids) if they are needed. These include: Canes. Walkers. Scooters. Crutches. Turn on the lights when you go into a dark area. Replace any light bulbs as soon as they burn out. Set up your furniture so you have a clear path. Avoid moving your furniture around. If any of your floors are uneven, fix them. If there are any pets around you, be aware of where they are. Review your medicines with your doctor. Some medicines can make you feel dizzy. This can increase your chance of falling. Ask your doctor what other things that you can do to help prevent falls. This information is not intended to replace advice given to you by your health care provider. Make sure you discuss any questions you have with your health care provider. Document Released: 05/29/2009 Document Revised: 01/08/2016 Document Reviewed: 09/06/2014 Elsevier Interactive Patient Education  2017 [employer].

## 2022-06-15 NOTE — Progress Notes (Signed)
Virtual Visit via Telephone Note  I connected with  Johnny Andrews on 06/15/22 at 11:15 AM EDT by telephone and verified that I am speaking with the correct person using two identifiers.  Location: Patient: Home Provider: [place] Persons participating in the virtual visit: [place]   I discussed the limitations, risks, security and privacy concerns of performing an evaluation and management service by telephone and the availability of in person appointments. The patient expressed understanding and agreed to proceed.  Interactive audio and video telecommunications were attempted between this nurse and patient, however failed, due to patient having technical difficulties OR patient did not have access to video capability.  We continued and completed visit with audio only.  Some vital signs may be absent or patient reported.   Sheral Flow, LPN  Subjective:   Johnny Andrews is a 61 y.o. male who presents for an Initial Medicare Annual Wellness Visit.  Review of Systems     Cardiac Risk Factors include: advanced age (>25mn, >>3women);diabetes mellitus;hypertension;male gender;obesity (BMI >30kg/m2);sedentary lifestyle;family history of premature cardiovascular disease;dyslipidemia     Objective:    Today's Vitals   06/15/22 1118  Weight: (!) 435 lb (197.3 kg)  Height: _0  (1.956 m)  PainSc: 5    Body mass index is 51.58 kg/m.     06/15/2022   11:23 AM  Advanced Directives  Does Patient Have a Medical Advance Directive? Yes  Type of [place]  Copy of [place] Chart? No - copy requested    Current Medications (verified) Outpatient Encounter Medications as of 06/15/2022  Medication Sig   albuterol (VENTOLIN HFA) 108 (90 Base) MCG/ACT inhaler TAKE 2 PUFFS BY MOUTH TWICE A DAY (Patient not taking: Reported on 06/03/2022)   ALPRAZolam (XANAX) 0.5 MG tablet  Take 1 tablet (0.5 mg total) by mouth 2 (two) times daily as needed for anxiety.   blood glucose meter kit and supplies 1 each by Other route 2 (two) times daily.   diclofenac (VOLTAREN) 75 MG EC tablet TAKE 1 TABLET BY MOUTH TWICE A DAY   empagliflozin (JARDIANCE) 25 MG TABS tablet Take 25 mg by mouth daily. (Patient not taking: Reported on 06/03/2022)   Fluticasone-Umeclidin-Vilant (TRELEGY ELLIPTA) 100-62.5-25 MCG/INH AEPB Inhale 1 puff into the lungs daily. (Patient not taking: Reported on 06/03/2022)   gabapentin (NEURONTIN) 300 MG capsule Take 300 mg by mouth daily.   glucose blood (ONE TOUCH ULTRA TEST) test strip Two times a day for ULTRA 2 ONE TOUCH   insulin aspart (NOVOLOG) 100 UNIT/ML FlexPen Inject 75 Units into the skin 2 (two) times daily before a meal. 30 units am and 50 units pm   Insulin Pen Needle (BD ULTRA-FINE PEN NEEDLES) 29G X 12.7MM MISC USE TWO CHECK BLOOD SUGAR TWICE A DAY. APPOINTMENT NEEDED FOR FURTHER REFILLS   levothyroxine (SYNTHROID) 137 MCG tablet Take 2 tablets (274 mcg total) by mouth daily before breakfast.   losartan-hydrochlorothiazide (HYZAAR) 100-12.5 MG tablet Take 1 tablet by mouth daily.   meclizine (ANTIVERT) 50 MG tablet Take 1 tablet (50 mg total) by mouth 3 (three) times daily as needed.   metFORMIN (GLUCOPHAGE-XR) 500 MG 24 hr tablet Take 1,000 mg by mouth 2 (two) times daily.    Respiratory Therapy Supplies (ADULT MASK) MISC 2 Devices by Does not apply route once.   simvastatin (ZOCOR) 20 MG tablet Take 20 mg by mouth daily.   topiramate (TOPAMAX) 50  MG tablet TAKE 2 TABLETS BY MOUTH AT BEDTIME   traZODone (DESYREL) 100 MG tablet TAKE 1 TABLET BY MOUTH EVERYDAY AT BEDTIME   TRESIBA FLEXTOUCH 200 UNIT/ML SOPN INJECT 100 UNITS TWICE A DAY   No facility-administered encounter medications on file as of 06/15/2022.    Allergies (verified) Other   History: Past Medical History:  Diagnosis Date   ALCOHOL USE 10/24/2008   ANXIETY 09/19/2007   Cough  05/22/2008   Diabetes mellitus without complication ([place])    Phreesia 06/02/2020   DIABETES MELLITUS, TYPE II 09/19/2007   External thrombosed hemorrhoids 08/23/2008   GOUT 04/17/2010   HYPERLIPIDEMIA 09/19/2007   HYPERTENSION 09/19/2007   Hypertension    Phreesia 06/02/2020   HYPOTHYROIDISM 09/19/2007   Leg pain    OSTEOARTHRITIS 09/19/2007   OTHER DISEASES OF LUNG NOT ELSEWHERE CLASSIFIED 07/20/2010   Rash and other nonspecific skin eruption 09/19/2007   Restrictive lung disease    SCROTAL ABSCESS 05/27/2010   Sebaceous cyst 08/01/2008   SLEEP APNEA 08/22/2009   Sleep apnea    Phreesia 06/02/2020   SMOKER 04/17/2010   Thyroid disease    Phreesia 06/02/2020   Past Surgical History:  Procedure Laterality Date   COLONOSCOPY     NASAL SEPTUM SURGERY     Family History  Problem Relation Age of Onset   Breast cancer Mother        Breast Cancer   Parkinson's disease Mother    Heart disease Father        CABG, stenting cardiac   Cancer Brother 42       prostate cancer   Lung disease Neg Hx    Autoimmune disease Neg Hx    Social History   Socioeconomic History   Marital status: Married    Spouse name: Not on file   Number of children: 2   Years of education: BS   Highest education level: Not on file  Occupational History   Occupation: Multimedia programmer: [employer]  Tobacco Use   Smoking status: Former    Packs/day: 0.50    Years: 30.00    Total pack years: 15.00    Types: Cigarettes    Quit date: 2008    Years since quitting: 15.8   Smokeless tobacco: Never   Tobacco comments:    30 years at most off and on smoking 03/14/15  Substance and Sexual Activity   Alcohol use: Yes    Alcohol/week: 0.0 standard drinks of alcohol    Comment: 2 drinks per day   Drug use: Yes    Comment: Occasional Marijuana use   Sexual activity: Not on file  Other Topics Concern   Not on file  Social History Narrative   Originally from [place]. Has lived in [place], [place], [place], & [place] since 1993. 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 Currently has a dog & cats. Previously had a parrot (conure) 12 years ago for 1 year but in same house.  Has also had excessive exposure to pigeon feces. He has had very brief exposure to asbestos around 2000. No hot tub exposure. He has inhaled chemical fumes/gases/refridgerants through his work.      Marital status: married x 26 years      Children: 2 children (19, 75); no grandchildren      Lives: Lives at home with his wife      Employment: Scientist, forensic  Tobacco: none; quit in 2013; smoked x 1/2 ppd x 20 years      Alcohol:  10 drinks; usually 2 drinks per day and then skip two days.      Drugs:  None      Exercise: none; job is physically demanding      Right-handed.   1 cup caffeine daily.   Social Determinants of Health   Financial Resource Strain: Low Risk  (06/15/2022)   Overall Financial Resource Strain (CARDIA)    Difficulty of Paying Living Expenses: Not hard at all  Food Insecurity: No Food Insecurity (06/15/2022)   Hunger Vital Sign    Worried About Running Out of Food in the Last Year: Never true    Ran Out of Food in the Last Year: Never true  Transportation Needs: No Transportation Needs (06/15/2022)   PRAPARE - Hydrologist (Medical): No    Lack of Transportation (Non-Medical): No  Physical Activity: Inactive (06/15/2022)   Exercise Vital Sign    Days of Exercise per Week: 0 days    Minutes of Exercise per Session: 0 min  Stress: No Stress Concern Present (06/15/2022)   [place]    Feeling of Stress : Not at all  Social Connections: Unknown (06/15/2022)   Social Connection and Isolation Panel [NHANES]    Frequency of Communication with Friends and Family: Patient refused    Frequency of Social Gatherings with Friends and Family: Patient refused    Attends Religious  Services: Patient refused    Marine scientist or Organizations: Patient refused    Attends Archivist Meetings: Patient refused    Marital Status: Married    Tobacco Counseling Counseling given: Not Answered Tobacco comments: 30 years at most off and on smoking 03/14/15   Clinical Intake:  Pre-visit preparation completed: Yes  Pain : 0-10 Pain Score: 5  Pain Type: Chronic pain Pain Location: Knee Pain Orientation: Left, Right Pain Onset: More than a month ago Pain Frequency: Constant     BMI - recorded: 51.58 (06/03/2022) Nutritional Status: BMI > 30  Obese Nutritional Risks: None Diabetes: No  How often do you need to have someone help you when you read instructions, pamphlets, or other written materials from your doctor or pharmacy?: 1 - Never What is the last grade level you completed in school?: [employer]; B.S DEGREE IN CRIMINAL JUSTICE  Nutrition Risk Assessment:  Has the patient had any N/V/D within the last 2 months?  No  Does the patient have any non-healing wounds?  No  Has the patient had any unintentional weight loss or weight gain?  No   Diabetes:  Is the patient diabetic?  Yes  If diabetic, was a CBG obtained today?  No  Did the patient bring in their glucometer from home?  No  How often do you monitor your CBG's? Three times a day.   Financial Strains and Diabetes Management:  Are you having any financial strains with the device, your supplies or your medication? No .  Does the patient want to be seen by Chronic Care Management for management of their diabetes?  No  Would the patient like to be referred to a Nutritionist or for Diabetic Management?  No   Diabetic Exams:  Diabetic Eye Exam: Completed 04/06/2022 Diabetic Foot Exam: Completed 05/31/2022 with Endocrinologist   Interpreter Needed?: No  Information entered by :: Lisette Abu, LPN.   Activities of Daily  Living    06/15/2022   11:32 AM  In your present state of  health, do you have any difficulty performing the following activities:  Hearing? 0  Vision? 0  Difficulty concentrating or making decisions? 0  Walking or climbing stairs? 1  Dressing or bathing? 0  Doing errands, shopping? 0  Preparing Food and eating ? N  Using the Toilet? N  In the past six months, have you accidently leaked urine? N  Do you have problems with loss of bowel control? N  Managing your Medications? N  Managing your Finances? N  Housekeeping or managing your Housekeeping? N    Patient Care Team: Horald Pollen, MD as PCP - General (Internal Medicine) Robyn Haber, MD as Consulting Physician (Family Medicine) Jacelyn Pi, MD as Referring Physician (Endocrinology) [facility] as Consulting Physician (Optometry)  Indicate any recent Medical Services you may have received from other than [facility] providers in the past year (date may be approximate).     Assessment:   This is a routine wellness examination for [place].  Hearing/Vision screen Hearing Screening - Comments:: Denies hearing difficulties   Vision Screening - Comments:: No eyeglasses or contacts.  Eye exam done by [facility].  Dietary issues and exercise activities discussed: Current Exercise Habits: The patient does not participate in regular exercise at present, Exercise limited by: orthopedic condition(s);neurologic condition(s);respiratory conditions(s)   Goals Addressed             This Visit's Progress    Client understands the importance of follow-up with providers by attending scheduled visits.       Client will verbalize knowledge of diabetes self-management as evidenced by Hgb A1C <7 or as defined by provider.            Depression Screen    06/15/2022   11:27 AM 06/03/2022    1:25 PM 12/03/2021    2:05 PM 03/02/2021    2:15 PM 08/28/2020    8:17 AM 07/08/2020   11:05 AM 06/26/2020    1:33 PM  PHQ 2/9 Scores  PHQ - 2 Score 0 0 0 0 0 0 0  PHQ- 9  Score 0 0 0        Fall Risk    06/15/2022   11:24 AM 03/02/2021    2:14 PM 08/28/2020    8:17 AM 07/15/2020    2:37 PM 07/08/2020   11:05 AM  Fall Risk   Falls in the past year? 1 1 0 0 0  Number falls in past yr: 0 0     Injury with Fall? 0 0     Risk for fall due to : Orthopedic patient;Impaired balance/gait   Impaired mobility;Impaired balance/gait   Follow up Falls evaluation completed;Falls prevention discussed;Education provided  Falls evaluation completed  Falls evaluation completed    FALL RISK PREVENTION PERTAINING TO THE HOME:  Any stairs in or around the home? No  If so, are there any without handrails? No  Home free of loose throw rugs in walkways, pet beds, electrical cords, etc? Yes  Adequate lighting in your home to reduce risk of falls? Yes   ASSISTIVE DEVICES UTILIZED TO PREVENT FALLS:  Life alert? No  Use of a cane, walker or w/c? Yes  Grab bars in the bathroom? Yes  Shower chair or bench in shower? Yes  Elevated toilet seat or a handicapped toilet? Yes   TIMED UP AND GO:  Was the test performed? No . Phone  Visit  Cognitive Function:        06/15/2022   11:33 AM  6CIT Screen  What Year? 0 points  What month? 0 points  What time? 0 points  Count back from 20 0 points  Months in reverse 0 points  Repeat phrase 0 points  Total Score 0 points    Immunizations Immunization History  Administered Date(s) Administered   Influenza Split 06/23/2012   Influenza, Quadrivalent, Recombinant, Inj, Pf 06/21/2019   Influenza,inj,Quad PF,6+ Mos 05/31/2016, 06/03/2022   Moderna SARS-COV2 Booster Vaccination 08/05/2020   Moderna Sars-Covid-2 Vaccination 11/20/2019, 12/18/2019   Pneumococcal Polysaccharide-23 12/13/2011   Td 10/24/2008    TDAP status: Due, Education has been provided regarding the importance of this vaccine. Advised may receive this vaccine at local pharmacy or Health Dept. Aware to provide a copy of the vaccination record if obtained from  local pharmacy or Health Dept. Verbalized acceptance and understanding.  Flu Vaccine status: Up to date  Pneumococcal vaccine status: Up to date   Covid-19 vaccine status: Completed vaccines  Qualifies for Shingles Vaccine? Yes   Zostavax completed No   Shingrix Completed?: No.    Education has been provided regarding the importance of this vaccine. Patient has been advised to call insurance company to determine out of pocket expense if they have not yet received this vaccine. Advised may also receive vaccine at local pharmacy or Health Dept. Verbalized acceptance and understanding.  Screening Tests Health Maintenance  Topic Date Due   COVID-19 Vaccine (3 - Moderna series) 09/30/2020   HEMOGLOBIN A1C  06/04/2022   TETANUS/TDAP  02/02/2023 (Originally 10/25/2018)   Zoster Vaccines- Shingrix (1 of 2) 02/02/2023 (Originally 02/10/2011)   Diabetic kidney evaluation - GFR measurement  12/16/2022   Diabetic kidney evaluation - Urine ACR  12/16/2022   OPHTHALMOLOGY EXAM  04/07/2023   FOOT EXAM  06/01/2023   Medicare Annual Wellness (AWV)  06/16/2023   COLONOSCOPY (Pts 45-59yr Insurance coverage will need to be confirmed)  02/24/2032   INFLUENZA VACCINE  Completed   Hepatitis C Screening  Completed   HIV Screening  Completed   HPV VACCINES  Aged Out    Health Maintenance  Health Maintenance Due  Topic Date Due   COVID-19 Vaccine (3 - Moderna series) 09/30/2020   HEMOGLOBIN A1C  06/04/2022    Colorectal cancer screening: Type of screening: Colonoscopy. Completed 02/23/2022. Repeat every 10 years  Lung Cancer Screening: (Low Dose CT Chest recommended if Age 61-80years, 30 pack-year currently smoking OR have quit w/in 15years.) does not qualify.   Lung Cancer Screening Referral: no  Additional Screening:  Hepatitis C Screening: does qualify; Completed 01/05/2017  Vision Screening: Recommended annual ophthalmology exams for early detection of glaucoma and other disorders of the  eye. Is the patient up to date with their annual eye exam?  Yes  Who is the provider or what is the name of the office in which the patient attends annual eye exams? [facility] If pt is not established with a provider, would they like to be referred to a provider to establish care? No .   Dental Screening: Recommended annual dental exams for proper oral hygiene  Community Resource Referral / Chronic Care Management: CRR required this visit?  No   CCM required this visit?  No      Plan:     I have personally reviewed and noted the following in the patient's chart:   Medical and social history Use of alcohol, tobacco or  illicit drugs  Current medications and supplements including opioid prescriptions. Patient is not currently taking opioid prescriptions. Functional ability and status Nutritional status Physical activity Advanced directives List of other physicians Hospitalizations, surgeries, and ER visits in previous 12 months Vitals Screenings to include cognitive, depression, and falls Referrals and appointments  In addition, I have reviewed and discussed with patient certain preventive protocols, quality metrics, and best practice recommendations. A written personalized care plan for preventive services as well as general preventive health recommendations were provided to patient.     Sheral Flow, LPN   31/25/0871   Nurse Notes: N/A

## 2022-06-17 DIAGNOSIS — M9903 Segmental and somatic dysfunction of lumbar region: Secondary | ICD-10-CM | POA: Diagnosis not present

## 2022-06-17 DIAGNOSIS — M9901 Segmental and somatic dysfunction of cervical region: Secondary | ICD-10-CM | POA: Diagnosis not present

## 2022-06-17 DIAGNOSIS — M25552 Pain in left hip: Secondary | ICD-10-CM | POA: Diagnosis not present

## 2022-06-17 DIAGNOSIS — M5451 Vertebrogenic low back pain: Secondary | ICD-10-CM | POA: Diagnosis not present

## 2022-06-21 ENCOUNTER — Other Ambulatory Visit: Payer: Self-pay | Admitting: Emergency Medicine

## 2022-06-21 DIAGNOSIS — F418 Other specified anxiety disorders: Secondary | ICD-10-CM

## 2022-06-22 ENCOUNTER — Other Ambulatory Visit: Payer: Self-pay | Admitting: Emergency Medicine

## 2022-06-22 DIAGNOSIS — F418 Other specified anxiety disorders: Secondary | ICD-10-CM

## 2022-06-22 DIAGNOSIS — M9903 Segmental and somatic dysfunction of lumbar region: Secondary | ICD-10-CM | POA: Diagnosis not present

## 2022-06-22 DIAGNOSIS — M25552 Pain in left hip: Secondary | ICD-10-CM | POA: Diagnosis not present

## 2022-06-22 DIAGNOSIS — M5451 Vertebrogenic low back pain: Secondary | ICD-10-CM | POA: Diagnosis not present

## 2022-06-22 DIAGNOSIS — M9901 Segmental and somatic dysfunction of cervical region: Secondary | ICD-10-CM | POA: Diagnosis not present

## 2022-06-22 MED ORDER — ALPRAZOLAM 0.5 MG PO TABS: 0.5000 mg | ORAL_TABLET | Freq: Two times a day (BID) | ORAL | 1 refills | Status: DC | PRN

## 2022-06-24 DIAGNOSIS — M25552 Pain in left hip: Secondary | ICD-10-CM | POA: Diagnosis not present

## 2022-06-24 DIAGNOSIS — M9903 Segmental and somatic dysfunction of lumbar region: Secondary | ICD-10-CM | POA: Diagnosis not present

## 2022-06-24 DIAGNOSIS — M5451 Vertebrogenic low back pain: Secondary | ICD-10-CM | POA: Diagnosis not present

## 2022-06-24 DIAGNOSIS — M9901 Segmental and somatic dysfunction of cervical region: Secondary | ICD-10-CM | POA: Diagnosis not present

## 2022-06-29 DIAGNOSIS — M9901 Segmental and somatic dysfunction of cervical region: Secondary | ICD-10-CM | POA: Diagnosis not present

## 2022-06-29 DIAGNOSIS — M9903 Segmental and somatic dysfunction of lumbar region: Secondary | ICD-10-CM | POA: Diagnosis not present

## 2022-06-29 DIAGNOSIS — M25552 Pain in left hip: Secondary | ICD-10-CM | POA: Diagnosis not present

## 2022-06-29 DIAGNOSIS — M5451 Vertebrogenic low back pain: Secondary | ICD-10-CM | POA: Diagnosis not present

## 2022-06-29 DIAGNOSIS — R972 Elevated prostate specific antigen [PSA]: Secondary | ICD-10-CM | POA: Diagnosis not present

## 2022-07-01 DIAGNOSIS — M9901 Segmental and somatic dysfunction of cervical region: Secondary | ICD-10-CM | POA: Diagnosis not present

## 2022-07-01 DIAGNOSIS — M5451 Vertebrogenic low back pain: Secondary | ICD-10-CM | POA: Diagnosis not present

## 2022-07-01 DIAGNOSIS — M9903 Segmental and somatic dysfunction of lumbar region: Secondary | ICD-10-CM | POA: Diagnosis not present

## 2022-07-01 DIAGNOSIS — M25552 Pain in left hip: Secondary | ICD-10-CM | POA: Diagnosis not present

## 2022-07-05 DIAGNOSIS — M9903 Segmental and somatic dysfunction of lumbar region: Secondary | ICD-10-CM | POA: Diagnosis not present

## 2022-07-05 DIAGNOSIS — M5451 Vertebrogenic low back pain: Secondary | ICD-10-CM | POA: Diagnosis not present

## 2022-07-05 DIAGNOSIS — M25552 Pain in left hip: Secondary | ICD-10-CM | POA: Diagnosis not present

## 2022-07-05 DIAGNOSIS — M9901 Segmental and somatic dysfunction of cervical region: Secondary | ICD-10-CM | POA: Diagnosis not present

## 2022-07-06 DIAGNOSIS — M25552 Pain in left hip: Secondary | ICD-10-CM | POA: Diagnosis not present

## 2022-07-06 DIAGNOSIS — M9903 Segmental and somatic dysfunction of lumbar region: Secondary | ICD-10-CM | POA: Diagnosis not present

## 2022-07-06 DIAGNOSIS — M5451 Vertebrogenic low back pain: Secondary | ICD-10-CM | POA: Diagnosis not present

## 2022-07-06 DIAGNOSIS — M9901 Segmental and somatic dysfunction of cervical region: Secondary | ICD-10-CM | POA: Diagnosis not present

## 2022-07-13 DIAGNOSIS — M9903 Segmental and somatic dysfunction of lumbar region: Secondary | ICD-10-CM | POA: Diagnosis not present

## 2022-07-13 DIAGNOSIS — M9901 Segmental and somatic dysfunction of cervical region: Secondary | ICD-10-CM | POA: Diagnosis not present

## 2022-07-13 DIAGNOSIS — M5451 Vertebrogenic low back pain: Secondary | ICD-10-CM | POA: Diagnosis not present

## 2022-07-13 DIAGNOSIS — M25552 Pain in left hip: Secondary | ICD-10-CM | POA: Diagnosis not present

## 2022-07-15 DIAGNOSIS — M5451 Vertebrogenic low back pain: Secondary | ICD-10-CM | POA: Diagnosis not present

## 2022-07-15 DIAGNOSIS — M9903 Segmental and somatic dysfunction of lumbar region: Secondary | ICD-10-CM | POA: Diagnosis not present

## 2022-07-15 DIAGNOSIS — M9901 Segmental and somatic dysfunction of cervical region: Secondary | ICD-10-CM | POA: Diagnosis not present

## 2022-07-15 DIAGNOSIS — M25552 Pain in left hip: Secondary | ICD-10-CM | POA: Diagnosis not present

## 2022-07-20 DIAGNOSIS — M25552 Pain in left hip: Secondary | ICD-10-CM | POA: Diagnosis not present

## 2022-07-20 DIAGNOSIS — M9901 Segmental and somatic dysfunction of cervical region: Secondary | ICD-10-CM | POA: Diagnosis not present

## 2022-07-20 DIAGNOSIS — M5451 Vertebrogenic low back pain: Secondary | ICD-10-CM | POA: Diagnosis not present

## 2022-07-20 DIAGNOSIS — M9903 Segmental and somatic dysfunction of lumbar region: Secondary | ICD-10-CM | POA: Diagnosis not present

## 2022-07-27 DIAGNOSIS — M25562 Pain in left knee: Secondary | ICD-10-CM | POA: Diagnosis not present

## 2022-07-27 DIAGNOSIS — M25561 Pain in right knee: Secondary | ICD-10-CM | POA: Diagnosis not present

## 2022-08-20 ENCOUNTER — Ambulatory Visit: Payer: Medicare PPO | Admitting: Family Medicine

## 2022-08-20 ENCOUNTER — Ambulatory Visit (INDEPENDENT_AMBULATORY_CARE_PROVIDER_SITE_OTHER): Payer: Medicare PPO

## 2022-08-20 ENCOUNTER — Encounter: Payer: Self-pay | Admitting: Family Medicine

## 2022-08-20 ENCOUNTER — Other Ambulatory Visit (INDEPENDENT_AMBULATORY_CARE_PROVIDER_SITE_OTHER): Payer: Medicare PPO

## 2022-08-20 VITALS — BP 138/82 | HR 75 | Temp 97.6°F | Ht 77.0 in

## 2022-08-20 DIAGNOSIS — R059 Cough, unspecified: Secondary | ICD-10-CM | POA: Diagnosis not present

## 2022-08-20 DIAGNOSIS — R0602 Shortness of breath: Secondary | ICD-10-CM | POA: Diagnosis not present

## 2022-08-20 DIAGNOSIS — R058 Other specified cough: Secondary | ICD-10-CM | POA: Diagnosis not present

## 2022-08-20 DIAGNOSIS — I517 Cardiomegaly: Secondary | ICD-10-CM

## 2022-08-20 DIAGNOSIS — I509 Heart failure, unspecified: Secondary | ICD-10-CM

## 2022-08-20 DIAGNOSIS — R5383 Other fatigue: Secondary | ICD-10-CM | POA: Diagnosis not present

## 2022-08-20 DIAGNOSIS — Z8701 Personal history of pneumonia (recurrent): Secondary | ICD-10-CM | POA: Diagnosis not present

## 2022-08-20 DIAGNOSIS — J811 Chronic pulmonary edema: Secondary | ICD-10-CM | POA: Diagnosis not present

## 2022-08-20 LAB — CBC WITH DIFFERENTIAL/PLATELET
Basophils Absolute: 0.1 10*3/uL (ref 0.0–0.1)
Basophils Relative: 1.4 % (ref 0.0–3.0)
Eosinophils Absolute: 0.4 10*3/uL (ref 0.0–0.7)
Eosinophils Relative: 4.4 % (ref 0.0–5.0)
HCT: 39.7 % (ref 39.0–52.0)
Hemoglobin: 13.3 g/dL (ref 13.0–17.0)
Lymphocytes Relative: 16 % (ref 12.0–46.0)
Lymphs Abs: 1.5 10*3/uL (ref 0.7–4.0)
MCHC: 33.4 g/dL (ref 30.0–36.0)
MCV: 90 fl (ref 78.0–100.0)
Monocytes Absolute: 1 10*3/uL (ref 0.1–1.0)
Monocytes Relative: 10.3 % (ref 3.0–12.0)
Neutro Abs: 6.4 10*3/uL (ref 1.4–7.7)
Neutrophils Relative %: 67.9 % (ref 43.0–77.0)
Platelets: 270 10*3/uL (ref 150.0–400.0)
RBC: 4.42 Mil/uL (ref 4.22–5.81)
RDW: 14.2 % (ref 11.5–15.5)
WBC: 9.4 10*3/uL (ref 4.0–10.5)

## 2022-08-20 LAB — BRAIN NATRIURETIC PEPTIDE: Pro B Natriuretic peptide (BNP): 11 pg/mL (ref 0.0–100.0)

## 2022-08-20 MED ORDER — FUROSEMIDE 20 MG PO TABS: 20.0000 mg | ORAL_TABLET | Freq: Every day | ORAL | 0 refills | Status: DC

## 2022-08-20 NOTE — Progress Notes (Signed)
Subjective:  Johnny Andrews is a 62 y.o. male who presents for productive cough x 2 weeks.  States he has been coughing a lot of thick green mucus.  Having some shortness of breath and dizziness.  Decreased appetite but he is still eating.  Hx of pneumonia.   Denies fever, chills, body aches, chest pain, palpitations, abdominal pain, nausea, vomiting or diarrhea.  No LE edema.  He has OSA and uses CPAP.  Treatment to date: NyQuil and Navage no other aggravating or relieving factors.  No other c/o.  ROS as in subjective.     Objective: Vitals:   08/20/22 0854  BP: 138/82  Pulse: 75  Temp: 97.6 F (36.4 C)  SpO2: 96%    General appearance: Alert, WD/WN, no distress, mildly ill appearing                             Skin: warm, no rash                           Head: no sinus tenderness                                          Nose: septum midline, turbinates swollen, with erythema and clear discharge             Mouth/throat: Mask on                           Neck: supple, no adenopathy, no thyromegaly, nontender                          Heart: RRR                         Lungs: CTA bilaterally, no wheezes, rales, or rhonchi      Assessment: Acute congestive heart failure, unspecified heart failure type ([place]) - Plan: ECHOCARDIOGRAM COMPLETE, furosemide (LASIX) 20 MG tablet  Productive cough - Plan: CBC with Differential/Platelet, DG Chest 2 View, CBC with Differential/Platelet  Shortness of breath - Plan: CBC with Differential/Platelet, DG Chest 2 View, CBC with Differential/Platelet, ECHOCARDIOGRAM COMPLETE, furosemide (LASIX) 20 MG tablet  Fatigue, unspecified type - Plan: CBC with Differential/Platelet, DG Chest 2 View, CBC with Differential/Platelet  History of pneumonia - Plan: CBC with Differential/Platelet, DG Chest 2 View, CBC with Differential/Platelet  Cardiomegaly - Plan: ECHOCARDIOGRAM COMPLETE, furosemide (LASIX) 20 MG tablet   Plan: Here for 2-week  history of worsening productive cough fatigue and shortness of breath.  He is not in any acute distress.  Euvolemic appearing. history of pneumonia.  OSA uses CPAP. Stat chest x-ray and stat CBC ordered  Findings today include new problem of cardiomegaly and pulmonary vascular congestion on CXR.  CBC normal. Will attempt to add on BNP  Discussed case with Dr. Sharlet Salina.  Reviewed updated medication list and notes from [facility]. Creatinine 1.1 in August 2023 He will continue his current medications and I will have him take Lasix 20 mg daily x 5 days. Echo also ordered.  Reviewed echocardiogram from 2022.  He will be scheduled to follow up here with PCP next week.   Advised him to monitor his breathing and symptoms over the weekend  and if worsening to go to the ED or call 911.  Needs medication reconciliation. It appears he may be on olmesartan and lisinopril after further review.

## 2022-08-20 NOTE — Patient Instructions (Signed)
Labs and a chest x-ray before you leave today.  We will be in touch with your results and send in appropriate medications to the pharmacy.  Take Tylenol or ibuprofen as needed for pain or fever.  Use Mucinex over-the-counter for cough and congestion.  I will also prescribe a cough medication called Tessalon Perles.  Make sure you are staying well-hydrated.  Follow-up if you are getting significantly worse or not back to baseline when you complete the antibiotic.

## 2022-08-20 NOTE — Progress Notes (Signed)
Called patient and discussed results and recommendations.

## 2022-08-26 ENCOUNTER — Encounter: Payer: Self-pay | Admitting: Emergency Medicine

## 2022-08-26 ENCOUNTER — Ambulatory Visit (INDEPENDENT_AMBULATORY_CARE_PROVIDER_SITE_OTHER): Payer: Medicare PPO | Admitting: Emergency Medicine

## 2022-08-26 VITALS — BP 136/74 | HR 87 | Temp 98.7°F | Ht 77.0 in | Wt >= 6400 oz

## 2022-08-26 DIAGNOSIS — I152 Hypertension secondary to endocrine disorders: Secondary | ICD-10-CM | POA: Diagnosis not present

## 2022-08-26 DIAGNOSIS — E034 Atrophy of thyroid (acquired): Secondary | ICD-10-CM

## 2022-08-26 DIAGNOSIS — R053 Chronic cough: Secondary | ICD-10-CM | POA: Diagnosis not present

## 2022-08-26 DIAGNOSIS — J22 Unspecified acute lower respiratory infection: Secondary | ICD-10-CM | POA: Insufficient documentation

## 2022-08-26 DIAGNOSIS — R0989 Other specified symptoms and signs involving the circulatory and respiratory systems: Secondary | ICD-10-CM

## 2022-08-26 DIAGNOSIS — E1159 Type 2 diabetes mellitus with other circulatory complications: Secondary | ICD-10-CM

## 2022-08-26 DIAGNOSIS — F418 Other specified anxiety disorders: Secondary | ICD-10-CM

## 2022-08-26 DIAGNOSIS — E1169 Type 2 diabetes mellitus with other specified complication: Secondary | ICD-10-CM

## 2022-08-26 DIAGNOSIS — R051 Acute cough: Secondary | ICD-10-CM | POA: Insufficient documentation

## 2022-08-26 DIAGNOSIS — E785 Hyperlipidemia, unspecified: Secondary | ICD-10-CM

## 2022-08-26 LAB — POCT GLYCOSYLATED HEMOGLOBIN (HGB A1C): Hemoglobin A1C: 6.9 % — AB (ref 4.0–5.6)

## 2022-08-26 MED ORDER — ALPRAZOLAM 0.5 MG PO TABS: 0.5000 mg | ORAL_TABLET | Freq: Two times a day (BID) | ORAL | 1 refills | Status: DC | PRN

## 2022-08-26 MED ORDER — AZITHROMYCIN 250 MG PO TABS: ORAL_TABLET | ORAL | 0 refills | Status: DC

## 2022-08-26 MED ORDER — LEVOTHYROXINE SODIUM 137 MCG PO TABS: 274.0000 ug | ORAL_TABLET | Freq: Every day | ORAL | 3 refills | Status: DC

## 2022-08-26 MED ORDER — ALBUTEROL SULFATE HFA 108 (90 BASE) MCG/ACT IN AERS: 2.0000 | INHALATION_SPRAY | RESPIRATORY_TRACT | 1 refills | Status: DC | PRN

## 2022-08-26 MED ORDER — FUROSEMIDE 40 MG PO TABS: 40.0000 mg | ORAL_TABLET | Freq: Every day | ORAL | 3 refills | Status: DC

## 2022-08-26 NOTE — Assessment & Plan Note (Signed)
Well-controlled hypertension. Continue Hyzaar 100-12.5 mg daily Well-controlled diabetes Lab Results  Component Value Date   HGBA1C 6.9 (A) 08/26/2022  Sees endocrinologist on a regular basis Continue Tresiba 100 units twice a day along with metformin 1000 mg twice a day Will be restarting Jardiance 25 mg daily soon Follows up with endocrinologist

## 2022-08-26 NOTE — Assessment & Plan Note (Signed)
Cough management discussed. Continue over-the-counter Mucinex DM and cough drops Advised to stay well-hydrated and start albuterol inhaler twice a day for the next 5 to 7 days

## 2022-08-26 NOTE — Assessment & Plan Note (Signed)
Stable.  Diet and nutrition discussed.  Continues simvastatin 20 mg daily.

## 2022-08-26 NOTE — Progress Notes (Signed)
Johnny Andrews 62 y.o.   Chief Complaint  Patient presents with   Follow-up    F/u appt , patient states he still has a cough, patient seen Vickie NP, concern about x ray     HISTORY OF PRESENT ILLNESS: This is a 62 y.o. male complaining of productive cough since last Christmas.  Occasional difficulty breathing Mostly complaining of persistent cough Seen here on 08/20/2022.  Chest x-ray showed mild cardiomegaly with some pulmonary vascular congestion. BP Readings from Last 3 Encounters:  08/26/22 136/74  08/20/22 138/82  06/03/22 138/70   Wt Readings from Last 3 Encounters:  08/26/22 (!) 430 lb 2 oz (195.1 kg)  06/15/22 (!) 435 lb (197.3 kg)  06/03/22 (!) 435 lb (197.3 kg)     HPI   Prior to Admission medications   Medication Sig Start Date End Date Taking? Authorizing Provider  albuterol (VENTOLIN HFA) 108 (90 Base) MCG/ACT inhaler TAKE 2 PUFFS BY MOUTH TWICE A DAY 09/12/20  Yes Gurveer Colucci, Ines Bloomer, MD  ALPRAZolam Duanne Moron) 0.5 MG tablet Take 1 tablet (0.5 mg total) by mouth 2 (two) times daily as needed for anxiety. 06/22/22  Yes Tyshawn Keel, Ines Bloomer, MD  blood glucose meter kit and supplies 1 each by Other route 2 (two) times daily. 03/14/20  Yes Keyonta Madrid, Ines Bloomer, MD  diclofenac (VOLTAREN) 75 MG EC tablet TAKE 1 TABLET BY MOUTH TWICE A DAY 05/15/22  Yes Hermione Havlicek, Ines Bloomer, MD  furosemide (LASIX) 20 MG tablet Take 1 tablet (20 mg total) by mouth daily. 08/20/22  Yes Henson, Vickie L, NP-C  gabapentin (NEURONTIN) 300 MG capsule Take 300 mg by mouth daily. 08/20/20  Yes [provider]  glucose blood (ONE TOUCH ULTRA TEST) test strip Two times a day for ULTRA 2 ONE TOUCH 02/28/20  Yes Rani Idler, Ines Bloomer, MD  insulin aspart (NOVOLOG) 100 UNIT/ML FlexPen Inject 75 Units into the skin 2 (two) times daily before a meal. 30 units am and 50 units pm   Yes [provider]  Insulin Pen Needle (BD ULTRA-FINE PEN NEEDLES) 29G X 12.7MM MISC USE TWO CHECK BLOOD SUGAR  TWICE A DAY. APPOINTMENT NEEDED FOR FURTHER REFILLS 02/28/20  Yes Jaya Lapka, Ines Bloomer, MD  losartan-hydrochlorothiazide [facility]) 100-12.5 MG tablet Take 1 tablet by mouth daily. 08/28/20  Yes Shanterria Franta, Ines Bloomer, MD  meclizine (ANTIVERT) 50 MG tablet Take 1 tablet (50 mg total) by mouth 3 (three) times daily as needed. 06/09/20  Yes Jeiry Birnbaum, Ines Bloomer, MD  metFORMIN (GLUCOPHAGE-XR) 500 MG 24 hr tablet Take 1,000 mg by mouth 2 (two) times daily.  09/15/15  Yes [provider]  Respiratory Therapy Supplies (ADULT MASK) MISC 2 Devices by Does not apply route once. 11/24/11  Yes Rigoberto Noel, MD  simvastatin (ZOCOR) 20 MG tablet Take 20 mg by mouth daily.   Yes [provider]  topiramate (TOPAMAX) 50 MG tablet TAKE 2 TABLETS BY MOUTH AT BEDTIME 05/15/22  Yes Johm Pfannenstiel, Ines Bloomer, MD  traZODone (DESYREL) 100 MG tablet TAKE 1 TABLET BY MOUTH EVERYDAY AT BEDTIME 02/03/22  Yes Himmat Enberg, Andrews, MD  TRESIBA FLEXTOUCH 200 UNIT/ML SOPN INJECT 100 UNITS TWICE A DAY 12/17/16  Yes [provider]  empagliflozin (JARDIANCE) 25 MG TABS tablet Take 25 mg by mouth daily. Patient not taking: Reported on 06/03/2022    [provider]  Fluticasone-Umeclidin-Vilant (TRELEGY ELLIPTA) 100-62.5-25 MCG/INH AEPB Inhale 1 puff into the lungs daily. Patient not taking: Reported on 06/03/2022 10/22/20   Laurin Coder, MD  levothyroxine (SYNTHROID) 137 MCG tablet Take 2 tablets (274 mcg total) by mouth daily before breakfast. 03/04/20 06/09/20  Georgina Quint, MD    Allergies  Allergen Reactions   Other     Dust mites    Patient Active Problem List   Diagnosis Date Noted   Plantar fasciitis, bilateral 06/01/2021   Heel pain, bilateral 05/30/2021   Claudication (HCC) 07/08/2020   Chronic venous insufficiency 07/25/2019   Encounter for orthopedic follow-up care 08/14/2018   Carpal tunnel syndrome of right wrist 04/24/2018   Diabetic peripheral neuropathy (HCC)  04/24/2018   Morbid obesity (HCC) 01/27/2018   Osteoarthritis of left knee 12/16/2017   Osteoarthritis of right knee 12/16/2017   Family history of prostate cancer 03/06/2017   Pure hypercholesterolemia 09/13/2016   Lumbar degenerative disc disease 07/22/2016   Spinal stenosis of lumbar region 11/03/2015   Abnormality of gait 09/15/2015   Restrictive lung disease 05/05/2015   Impotence of organic origin 02/05/2013   Hypogonadism male 02/05/2013   Elevated PSA 11/30/2011   OTHER DISEASES OF LUNG NOT ELSEWHERE CLASSIFIED 07/20/2010   GOUT 04/17/2010   SMOKER 04/17/2010   Sleep apnea 08/22/2009   Hypothyroidism 09/19/2007   Dyslipidemia associated with type 2 diabetes mellitus (HCC) 09/19/2007   Situational anxiety 09/19/2007   Hypertension associated with diabetes (HCC) 09/19/2007   Osteoarthritis 09/19/2007    Past Medical History:  Diagnosis Date   ALCOHOL USE 10/24/2008   ANXIETY 09/19/2007   Cough 05/22/2008   Diabetes mellitus without complication (HCC)    Phreesia 06/02/2020   DIABETES MELLITUS, TYPE II 09/19/2007   External thrombosed hemorrhoids 08/23/2008   GOUT 04/17/2010   HYPERLIPIDEMIA 09/19/2007   HYPERTENSION 09/19/2007   Hypertension    Phreesia 06/02/2020   HYPOTHYROIDISM 09/19/2007   Leg pain    OSTEOARTHRITIS 09/19/2007   OTHER DISEASES OF LUNG NOT ELSEWHERE CLASSIFIED 07/20/2010   Rash and other nonspecific skin eruption 09/19/2007   Restrictive lung disease    SCROTAL ABSCESS 05/27/2010   Sebaceous cyst 08/01/2008   SLEEP APNEA 08/22/2009   Sleep apnea    Phreesia 06/02/2020   SMOKER 04/17/2010   Thyroid disease    Phreesia 06/02/2020    Past Surgical History:  Procedure Laterality Date   COLONOSCOPY     NASAL SEPTUM SURGERY      Social History   Socioeconomic History   Marital status: Married    Spouse name: Not on file   Number of children: 2   Years of education: BS   Highest education level: Not on file  Occupational History   Occupation: Naval architect: [employer]  Tobacco Use   Smoking status: Former    Packs/day: 0.50    Years: 30.00    Total pack years: 15.00    Types: Cigarettes    Quit date: 2008    Years since quitting: 16.0   Smokeless tobacco: Never   Tobacco comments:    30 years at most off and on smoking 03/14/15  Substance and Sexual Activity   Alcohol use: Yes    Alcohol/week: 0.0 standard drinks of alcohol    Comment: 2 drinks per day   Drug use: Yes    Comment: Occasional Marijuana use   Sexual activity: Not on file  Other Topics Concern   Not on file  Social History Narrative   Originally from [place]. Has lived in [place], [place], [place], & [place] since 1993. 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 Currently has a dog & cats. Previously had a parrot (conure) 12 years ago for 1 year but in same house.  Has also had excessive exposure to pigeon feces. He has had very brief exposure to asbestos around 2000. No hot tub exposure. He has inhaled chemical fumes/gases/refridgerants through his work.      Marital status: married x 26 years      Children: 2 children (19, 29); no grandchildren      Lives: Lives at home with his wife      Employment: [employer]      Tobacco: none; quit in 2013; smoked x 1/2 ppd x 20 years      Alcohol:  10 drinks; usually 2 drinks per day and then skip two days.      Drugs:  None      Exercise: none; job is physically demanding      Right-handed.   1 cup caffeine daily.   Social Determinants of Health   Financial Resource Strain: Low Risk  (06/15/2022)   Overall Financial Resource Strain (CARDIA)    Difficulty of Paying Living Expenses: Not hard at all  Food Insecurity: No Food Insecurity (06/15/2022)   Hunger Vital Sign    Worried About Running Out of Food in the Last Year: Never true    Ran Out of Food in the Last Year: Never true  Transportation Needs: No Transportation Needs (06/15/2022)   PRAPARE -  Administrator, Civil Service (Medical): No    Lack of Transportation (Non-Medical): No  Physical Activity: Inactive (06/15/2022)   Exercise Vital Sign    Days of Exercise per Week: 0 days    Minutes of Exercise per Session: 0 min  Stress: No Stress Concern Present (06/15/2022)   [employer] of Occupational Health - Occupational Stress Questionnaire    Feeling of Stress : Not at all  Social Connections: Unknown (06/15/2022)   Social Connection and Isolation Panel [NHANES]    Frequency of Communication with Friends and Family: Patient refused    Frequency of Social Gatherings with Friends and Family: Patient refused    Attends Religious Services: Patient refused    Active Member of Clubs or Organizations: Patient refused    Attends Banker Meetings: Patient refused    Marital Status: Married  Catering manager Violence: Not At Risk (06/15/2022)   Humiliation, Afraid, Rape, and Kick questionnaire    Fear of Current or Ex-Partner: No    Emotionally Abused: No    Physically Abused: No    Sexually Abused: No    Family History  Problem Relation Age of Onset   Breast cancer Mother        Breast Cancer   Parkinson's disease Mother    Heart disease Father        CABG, stenting cardiac   Cancer Brother 51       prostate cancer   Lung disease Neg Hx    Autoimmune disease Neg Hx      Review of Systems  Constitutional: Negative.  Negative for chills and fever.  HENT:  Positive for congestion. Negative for sore throat.   Respiratory:  Positive for cough and sputum production.   Cardiovascular: Negative.  Negative for chest pain and palpitations.  Gastrointestinal:  Negative for abdominal pain, diarrhea, nausea and vomiting.  Genitourinary: Negative.  Negative for dysuria and hematuria.  Skin: Negative.  Negative for rash.  Neurological: Negative.  Negative for dizziness and headaches.  All other systems reviewed and are negative.  Vitals:   08/26/22  1319  BP: 136/74  Pulse: 87  Temp: 98.7 F (37.1 C)  SpO2: 90%     Physical Exam Vitals reviewed.  Constitutional:      Appearance: Normal appearance. He is obese.  HENT:     Head: Normocephalic.  Eyes:     Extraocular Movements: Extraocular movements intact.     Pupils: Pupils are equal, round, and reactive to light.  Cardiovascular:     Rate and Rhythm: Normal rate and regular rhythm.     Pulses: Normal pulses.     Heart sounds: Normal heart sounds.  Pulmonary:     Effort: Pulmonary effort is normal.     Breath sounds: Normal breath sounds.  Skin:    General: Skin is warm and dry.  Neurological:     General: No focal deficit present.     Mental Status: He is alert and oriented to person, place, and time.  Psychiatric:        Mood and Affect: Mood normal.        Behavior: Behavior normal.    DG Chest 2 View  Result Date: 08/20/2022 CLINICAL DATA:  Cough for 2 weeks. EXAM: CHEST - 2 VIEW COMPARISON:  Chest radiograph dated July 15, 2020 FINDINGS: The heart is enlarged with pulmonary vascular congestion. 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 Lungs are otherwise clear without evidence of focal consolidation or large pleural effusion. IMPRESSION: Cardiomegaly with pulmonary vascular congestion. No focal consolidation or large pleural effusion. Electronically Signed   By: Larose Hires D.O.   On: 08/20/2022 09:51     ASSESSMENT & PLAN: A total of 44 minutes was spent with the patient and counseling/coordination of care regarding preparing for this visit, review of most recent office visit notes, review of most recent chest x-ray report, review of multiple chronic medical conditions under management, review of all medications, presence of lower respiratory infection and need for antibiotics, cough management, education on nutrition, prognosis, documentation, and need for follow-up.  Problem List Items Addressed This Visit       Cardiovascular and Mediastinum   Hypertension  associated with diabetes (HCC)    Well-controlled hypertension. Continue Hyzaar 100-12.5 mg daily Well-controlled diabetes Lab Results  Component Value Date   HGBA1C 6.9 (A) 08/26/2022  Sees endocrinologist on a regular basis Continue Tresiba 100 units twice a day along with metformin 1000 mg twice a day Will be restarting Jardiance 25 mg daily soon Follows up with endocrinologist       Relevant Medications   furosemide (LASIX) 40 MG tablet   Other Relevant Orders   POCT HgB A1C (Completed)     Respiratory   Chest congestion    Recent chest x-ray showed vascular congestion. Recommend to increase Lasix to 40 mg daily. Last echocardiogram in 2022.  Report reviewed.  Pretty good systolic function at that time.      Relevant Medications   furosemide (LASIX) 40 MG tablet   Lower respiratory infection    Persistent flulike symptoms since Christmas day. No pneumonia but may have secondary bacterial infection. Will benefit from daily azithromycin for 5 days      Relevant Medications   azithromycin (ZITHROMAX) 250 MG tablet     Endocrine   Hypothyroidism   Relevant Medications   levothyroxine (SYNTHROID) 137 MCG tablet   Dyslipidemia associated with type 2 diabetes mellitus (HCC)    Stable.  Diet and  nutrition discussed.  Continues simvastatin 20 mg daily.        Other   Situational anxiety   Relevant Medications   ALPRAZolam (XANAX) 0.5 MG tablet   Persistent cough - Primary    Cough management discussed. Continue over-the-counter Mucinex DM and cough drops Advised to stay well-hydrated and start albuterol inhaler twice a day for the next 5 to 7 days      Relevant Medications   albuterol (VENTOLIN HFA) 108 (90 Base) MCG/ACT inhaler   Patient Instructions  Cough, Adult A cough helps to clear your throat and lungs. A cough may be a sign of an illness or another medical condition. An acute cough may only last 2-3 weeks, while a chronic cough may last 8 or more  weeks. Many things can cause a cough. They include: Germs (viruses or bacteria) that attack the airway. Breathing in things that bother (irritate) your lungs. Allergies. Asthma. Mucus that runs down the back of your throat (postnasal drip). Smoking. Acid backing up from the stomach into the tube that moves food from the mouth to the stomach (gastroesophageal reflux). Some medicines. Lung problems. Other medical conditions, such as heart failure or a blood clot in the lung (pulmonary embolism). Follow these instructions at home: Medicines Take over-the-counter and prescription medicines only as told by your doctor. Talk with your doctor before you take medicines that stop a cough (cough suppressants). Lifestyle  Do not smoke, and try not to be around smoke. Do not use any products that contain nicotine or tobacco, such as cigarettes, e-cigarettes, and chewing tobacco. If you need help quitting, ask your doctor. Drink enough fluid to keep your pee (urine) pale yellow. Avoid caffeine. Do not drink alcohol if your doctor tells you not to drink. General instructions  Watch for any changes in your cough. Tell your doctor about them. Always cover your mouth when you cough. Stay away from things that make you cough, such as perfume, candles, campfire smoke, or cleaning products. If the air is dry, use a cool mist vaporizer or humidifier in your home. If your cough is worse at night, try using extra pillows to raise your head up higher while you sleep. Rest as needed. Keep all follow-up visits as told by your doctor. This is important. Contact a doctor if: You have new symptoms. You cough up pus. Your cough does not get better after 2-3 weeks, or your cough gets worse. Cough medicine does not help your cough and you are not sleeping well. You have pain that gets worse or pain that is not helped with medicine. You have a fever. You are losing weight and you do not know why. You have  night sweats. Get help right away if: You cough up blood. You have trouble breathing. Your heartbeat is very fast. These symptoms may be an emergency. Do not wait to see if the symptoms will go away. Get medical help right away. Call your local emergency services (911 in the [place] Do not drive yourself to the hospital. Summary A cough helps to clear your throat and lungs. Many things can cause a cough. Take over-the-counter and prescription medicines only as told by your doctor. Always cover your mouth when you cough. Contact a doctor if you have new symptoms or you have a cough that does not get better or gets worse. This information is not intended to replace advice given to you by your health care provider. Make sure you discuss any questions you have with  your health care provider. Document Revised: 09/21/2019 Document Reviewed: 08/21/2018 Elsevier Patient Education  2023 Elsevier Inc.    Edwina Barth, MD [facility]

## 2022-08-26 NOTE — Assessment & Plan Note (Signed)
Persistent flulike symptoms since Christmas day. No pneumonia but may have secondary bacterial infection. Will benefit from daily azithromycin for 5 days

## 2022-08-26 NOTE — Assessment & Plan Note (Signed)
Recent chest x-ray showed vascular congestion. Recommend to increase Lasix to 40 mg daily. Last echocardiogram in 2022.  Report reviewed.  Pretty good systolic function at that time.

## 2022-08-26 NOTE — Patient Instructions (Signed)

## 2022-09-03 ENCOUNTER — Ambulatory Visit: Payer: Medicare PPO | Admitting: Family Medicine

## 2022-09-07 DIAGNOSIS — M9901 Segmental and somatic dysfunction of cervical region: Secondary | ICD-10-CM | POA: Diagnosis not present

## 2022-09-07 DIAGNOSIS — M25552 Pain in left hip: Secondary | ICD-10-CM | POA: Diagnosis not present

## 2022-09-07 DIAGNOSIS — M9903 Segmental and somatic dysfunction of lumbar region: Secondary | ICD-10-CM | POA: Diagnosis not present

## 2022-09-07 DIAGNOSIS — M5451 Vertebrogenic low back pain: Secondary | ICD-10-CM | POA: Diagnosis not present

## 2022-09-09 DIAGNOSIS — M5451 Vertebrogenic low back pain: Secondary | ICD-10-CM | POA: Diagnosis not present

## 2022-09-09 DIAGNOSIS — M25552 Pain in left hip: Secondary | ICD-10-CM | POA: Diagnosis not present

## 2022-09-09 DIAGNOSIS — M9903 Segmental and somatic dysfunction of lumbar region: Secondary | ICD-10-CM | POA: Diagnosis not present

## 2022-09-09 DIAGNOSIS — M9901 Segmental and somatic dysfunction of cervical region: Secondary | ICD-10-CM | POA: Diagnosis not present

## 2022-09-14 DIAGNOSIS — M25552 Pain in left hip: Secondary | ICD-10-CM | POA: Diagnosis not present

## 2022-09-14 DIAGNOSIS — M9901 Segmental and somatic dysfunction of cervical region: Secondary | ICD-10-CM | POA: Diagnosis not present

## 2022-09-14 DIAGNOSIS — M9903 Segmental and somatic dysfunction of lumbar region: Secondary | ICD-10-CM | POA: Diagnosis not present

## 2022-09-14 DIAGNOSIS — M5451 Vertebrogenic low back pain: Secondary | ICD-10-CM | POA: Diagnosis not present

## 2022-09-28 DIAGNOSIS — M5416 Radiculopathy, lumbar region: Secondary | ICD-10-CM | POA: Diagnosis not present

## 2022-09-30 ENCOUNTER — Telehealth: Payer: Self-pay

## 2022-09-30 NOTE — Telephone Encounter (Signed)
Melissa with [facility] has called and stated the pt is sched to come in for an ECHOCARDIOGRAM COMPLETE and the order that was sent in on 09/08/2022 has expired on 09/23/2022. She is asking that another order be submitted so pt can have ECHO done on said sched date.

## 2022-09-30 NOTE — Telephone Encounter (Signed)
New order placed to expire in a year, sending to [facility] for just FYI

## 2022-09-30 NOTE — Addendum Note (Signed)
Addended by: Rossie Muskrat on: 09/30/2022 04:31 PM   Modules accepted: Orders

## 2022-10-05 ENCOUNTER — Ambulatory Visit (HOSPITAL_COMMUNITY)
Admission: RE | Admit: 2022-10-05 | Discharge: 2022-10-05 | Disposition: A | Discharge status: Home / Self Care | Payer: Medicare PPO | Source: Ambulatory Visit | Attending: Family Medicine | Admitting: Family Medicine

## 2022-10-05 DIAGNOSIS — I110 Hypertensive heart disease with heart failure: Secondary | ICD-10-CM | POA: Diagnosis not present

## 2022-10-05 DIAGNOSIS — I517 Cardiomegaly: Secondary | ICD-10-CM

## 2022-10-05 DIAGNOSIS — I509 Heart failure, unspecified: Secondary | ICD-10-CM | POA: Insufficient documentation

## 2022-10-05 DIAGNOSIS — R0602 Shortness of breath: Secondary | ICD-10-CM | POA: Insufficient documentation

## 2022-10-05 DIAGNOSIS — F17200 Nicotine dependence, unspecified, uncomplicated: Secondary | ICD-10-CM | POA: Insufficient documentation

## 2022-10-05 DIAGNOSIS — E119 Type 2 diabetes mellitus without complications: Secondary | ICD-10-CM | POA: Insufficient documentation

## 2022-10-05 DIAGNOSIS — E785 Hyperlipidemia, unspecified: Secondary | ICD-10-CM | POA: Diagnosis not present

## 2022-10-05 DIAGNOSIS — G4730 Sleep apnea, unspecified: Secondary | ICD-10-CM | POA: Insufficient documentation

## 2022-10-05 LAB — ECHOCARDIOGRAM COMPLETE
Area-P 1/2: 2.48 cm2
Calc EF: 55.3 %
Single Plane A2C EF: 51.9 %
Single Plane A4C EF: 55.5 %

## 2022-10-05 NOTE — Progress Notes (Signed)
Echocardiogram 2D Echocardiogram has been performed.  Johnny Andrews 10/05/2022, 4:02 PM

## 2022-10-12 NOTE — Progress Notes (Signed)
His echocardiogram showed some mild abnormalities. Please schedule a follow up with Dr. Mitchel Honour in the next month to address these. If he has any new or worsening symptoms, he should call us sooner to be seen.

## 2022-10-19 ENCOUNTER — Encounter: Payer: Self-pay | Admitting: Emergency Medicine

## 2022-10-19 ENCOUNTER — Ambulatory Visit: Payer: Medicare PPO | Admitting: Emergency Medicine

## 2022-10-19 VITALS — BP 132/76 | HR 83 | Temp 98.3°F | Ht 77.0 in | Wt >= 6400 oz

## 2022-10-19 DIAGNOSIS — R051 Acute cough: Secondary | ICD-10-CM

## 2022-10-19 DIAGNOSIS — R0989 Other specified symptoms and signs involving the circulatory and respiratory systems: Secondary | ICD-10-CM | POA: Diagnosis not present

## 2022-10-19 DIAGNOSIS — J22 Unspecified acute lower respiratory infection: Secondary | ICD-10-CM

## 2022-10-19 MED ORDER — AZITHROMYCIN 250 MG PO TABS: ORAL_TABLET | ORAL | 0 refills | Status: DC

## 2022-10-19 NOTE — Assessment & Plan Note (Addendum)
Continue over-the-counter Mucinex DM. No wheezing on physical examination No signs of congestive heart failure No rales no peripheral edema

## 2022-10-19 NOTE — Progress Notes (Signed)
Johnny Andrews 62 y.o.   Chief Complaint  Patient presents with   Follow-up    F/u appt from having an echocardiogram,  cough x 2 weeks     HISTORY OF PRESENT ILLNESS: Acute problem visit today. This is a 62 y.o. male complaining of 2-week history of chest congestion and productive cough Also recently had echocardiogram done and wants to go over the results. Echocardiogram shows normal ejection fraction with preserved ventricular function Similar to echo done last year.  No significant findings. Denies difficulty breathing.  Denies chest pain.  Denies fever or chills No other associated symptoms No other complaints or medical concerns today.  HPI   Prior to Admission medications   Medication Sig Start Date End Date Taking? Authorizing Provider  albuterol (VENTOLIN HFA) 108 (90 Base) MCG/ACT inhaler Inhale 2 puffs into the lungs every 4 (four) hours as needed for wheezing or shortness of breath. 08/26/22  Yes Dhani Imel, Ines Bloomer, MD  ALPRAZolam Duanne Moron) 0.5 MG tablet Take 1 tablet (0.5 mg total) by mouth 2 (two) times daily as needed for anxiety. 08/26/22  Yes Horald Pollen, MD  azithromycin [facility]) 250 MG tablet Sig as indicated 10/19/22  Yes Caison Hearn, Ines Bloomer, MD  diclofenac (VOLTAREN) 75 MG EC tablet TAKE 1 TABLET BY MOUTH TWICE A DAY 05/15/22  Yes Terresa Marlett, Ines Bloomer, MD  furosemide (LASIX) 40 MG tablet Take 1 tablet (40 mg total) by mouth daily. 08/26/22  Yes Pope Brunty, Ines Bloomer, MD  glucose blood (ONE TOUCH ULTRA TEST) test strip Two times a day for ULTRA 2 ONE TOUCH 02/28/20  Yes Marcio Hoque, Ines Bloomer, MD  insulin aspart (NOVOLOG) 100 UNIT/ML FlexPen Inject 75 Units into the skin 2 (two) times daily before a meal. 30 units am and 50 units pm   Yes [provider]  Insulin Pen Needle (BD ULTRA-FINE PEN NEEDLES) 29G X 12.7MM MISC USE TWO CHECK BLOOD SUGAR TWICE A DAY. APPOINTMENT NEEDED FOR FURTHER REFILLS 02/28/20  Yes Horald Pollen, MD   levothyroxine (SYNTHROID) 137 MCG tablet Take 2 tablets (274 mcg total) by mouth daily before breakfast. 08/26/22 08/21/23 Yes Aryana Wonnacott, Ines Bloomer, MD  losartan-hydrochlorothiazide (HYZAAR) 100-12.5 MG tablet Take 1 tablet by mouth daily. 08/28/20  Yes Shriley Joffe, Ines Bloomer, MD  meclizine (ANTIVERT) 50 MG tablet Take 1 tablet (50 mg total) by mouth 3 (three) times daily as needed. 06/09/20  Yes Vernis Cabacungan, Ines Bloomer, MD  metFORMIN (GLUCOPHAGE-XR) 500 MG 24 hr tablet Take 1,000 mg by mouth 2 (two) times daily.  09/15/15  Yes [provider]  Respiratory Therapy Supplies (ADULT MASK) MISC 2 Devices by Does not apply route once. 11/24/11  Yes Rigoberto Noel, MD  simvastatin (ZOCOR) 20 MG tablet Take 20 mg by mouth daily.   Yes [provider]  topiramate (TOPAMAX) 50 MG tablet TAKE 2 TABLETS BY MOUTH AT BEDTIME 05/15/22  Yes Aiyanah Kalama, Ines Bloomer, MD  traZODone (DESYREL) 100 MG tablet TAKE 1 TABLET BY MOUTH EVERYDAY AT BEDTIME 02/03/22  Yes Decoda Van, [place], MD  TRESIBA FLEXTOUCH 200 UNIT/ML SOPN INJECT 100 UNITS TWICE A DAY 12/17/16  Yes [provider]  blood glucose meter kit and supplies 1 each by Other route 2 (two) times daily. 03/14/20   Horald Pollen, MD  empagliflozin (JARDIANCE) 25 MG TABS tablet Take 25 mg by mouth daily. Patient not taking: Reported on 06/03/2022    [provider]  Fluticasone-Umeclidin-Vilant (TRELEGY ELLIPTA) 100-62.5-25 MCG/INH AEPB Inhale 1 puff into the lungs daily.  Patient not taking: Reported on 06/03/2022 10/22/20   Laurin Coder, MD  gabapentin (NEURONTIN) 300 MG capsule Take 300 mg by mouth daily. Patient not taking: Reported on 10/19/2022 08/20/20   [provider]    Allergies  Allergen Reactions   Other     Dust mites    Patient Active Problem List   Diagnosis Date Noted   Persistent cough 08/26/2022   Lower respiratory infection 08/26/2022   Plantar fasciitis, bilateral 06/01/2021   Heel pain,  bilateral 05/30/2021   Claudication ([place]) 07/08/2020   Chronic venous insufficiency 07/25/2019   Encounter for orthopedic follow-up care 08/14/2018   Carpal tunnel syndrome of right wrist 04/24/2018   Diabetic peripheral neuropathy ([place]) 04/24/2018   Morbid obesity ([place]) 01/27/2018   Osteoarthritis of left knee 12/16/2017   Osteoarthritis of right knee 12/16/2017   Family history of prostate cancer 03/06/2017   Pure hypercholesterolemia 09/13/2016   Lumbar degenerative disc disease 07/22/2016   Spinal stenosis of lumbar region 11/03/2015   Abnormality of gait 09/15/2015   Restrictive lung disease 05/05/2015   Impotence of organic origin 02/05/2013   Hypogonadism male 02/05/2013   Elevated PSA 11/30/2011   Chest congestion 07/20/2010   GOUT 04/17/2010   SMOKER 04/17/2010   Sleep apnea 08/22/2009   Hypothyroidism 09/19/2007   Dyslipidemia associated with type 2 diabetes mellitus ([place]) 09/19/2007   Situational anxiety 09/19/2007   Hypertension associated with diabetes ([place]) 09/19/2007   Osteoarthritis 09/19/2007    Past Medical History:  Diagnosis Date   ALCOHOL USE 10/24/2008   ANXIETY 09/19/2007   Cough 05/22/2008   Diabetes mellitus without complication ([place])    Phreesia 06/02/2020   DIABETES MELLITUS, TYPE II 09/19/2007   External thrombosed hemorrhoids 08/23/2008   GOUT 04/17/2010   HYPERLIPIDEMIA 09/19/2007   HYPERTENSION 09/19/2007   Hypertension    Phreesia 06/02/2020   HYPOTHYROIDISM 09/19/2007   Leg pain    OSTEOARTHRITIS 09/19/2007   OTHER DISEASES OF LUNG NOT ELSEWHERE CLASSIFIED 07/20/2010   Rash and other nonspecific skin eruption 09/19/2007   Restrictive lung disease    SCROTAL ABSCESS 05/27/2010   Sebaceous cyst 08/01/2008   SLEEP APNEA 08/22/2009   Sleep apnea    Phreesia 06/02/2020   SMOKER 04/17/2010   Thyroid disease    Phreesia 06/02/2020    Past Surgical History:  Procedure Laterality Date   COLONOSCOPY     NASAL SEPTUM SURGERY      Social History    Socioeconomic History   Marital status: Married    Spouse name: Not on file   Number of children: 2   Years of education: BS   Highest education level: Not on file  Occupational History   Occupation: Multimedia programmer: [employer]  Tobacco Use   Smoking status: Former    Packs/day: 0.50    Years: 30.00    Total pack years: 15.00    Types: Cigarettes    Quit date: 2008    Years since quitting: 16.1   Smokeless tobacco: Never   Tobacco comments:    30 years at most off and on smoking 03/14/15  Substance and Sexual Activity   Alcohol use: Yes    Alcohol/week: 0.0 standard drinks of alcohol    Comment: 2 drinks per day   Drug use: Yes    Comment: Occasional Marijuana use   Sexual activity: Not on file  Other Topics Concern   Not on file  Social History Narrative   Originally from [place]. Has  lived in [place], [place], [place], & [place] since 1993. 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 Currently has a dog & cats. Previously had a parrot (conure) 12 years ago for 1 year but in same house.  Has also had excessive exposure to pigeon feces. He has had very brief exposure to asbestos around 2000. No hot tub exposure. He has inhaled chemical fumes/gases/refridgerants through his work.      Marital status: married x 26 years      Children: 2 children (19, 54); no grandchildren      Lives: Lives at home with his wife      Employment: [employer]      Tobacco: none; quit in 2013; smoked x 1/2 ppd x 20 years      Alcohol:  10 drinks; usually 2 drinks per day and then skip two days.      Drugs:  None      Exercise: none; job is physically demanding      Right-handed.   1 cup caffeine daily.   Social Determinants of Health   Financial Resource Strain: Low Risk  (06/15/2022)   Overall Financial Resource Strain (CARDIA)    Difficulty of Paying Living Expenses: Not hard at all  Food Insecurity: No Food Insecurity (06/15/2022)    Hunger Vital Sign    Worried About Running Out of Food in the Last Year: Never true    Ran Out of Food in the Last Year: Never true  Transportation Needs: No Transportation Needs (06/15/2022)   PRAPARE - Hydrologist (Medical): No    Lack of Transportation (Non-Medical): No  Physical Activity: Inactive (06/15/2022)   Exercise Vital Sign    Days of Exercise per Week: 0 days    Minutes of Exercise per Session: 0 min  Stress: No Stress Concern Present (06/15/2022)   [place]    Feeling of Stress : Not at all  Social Connections: Unknown (06/15/2022)   Social Connection and Isolation Panel [NHANES]    Frequency of Communication with Friends and Family: Patient refused    Frequency of Social Gatherings with Friends and Family: Patient refused    Attends Religious Services: Patient refused    Active Member of Clubs or Organizations: Patient refused    Attends Archivist Meetings: Patient refused    Marital Status: Married  Human resources officer Violence: Not At Risk (06/15/2022)   Humiliation, Afraid, Rape, and Kick questionnaire    Fear of Current or Ex-Partner: No    Emotionally Abused: No    Physically Abused: No    Sexually Abused: No    Family History  Problem Relation Age of Onset   Breast cancer Mother        Breast Cancer   Parkinson's disease Mother    Heart disease Father        CABG, stenting cardiac   Cancer Brother 27       prostate cancer   Lung disease Neg Hx    Autoimmune disease Neg Hx      Review of Systems  Constitutional: Negative.  Negative for chills and fever.  HENT: Negative.  Negative for congestion and sore throat.   Respiratory:  Positive for cough and sputum production. Negative for hemoptysis, shortness of breath and wheezing.   Cardiovascular: Negative.  Negative for chest pain and palpitations.  Gastrointestinal:  Negative for abdominal pain,  nausea and vomiting.  Genitourinary: Negative.  Negative for dysuria and hematuria.  Skin: Negative.  Negative for rash.  Neurological: Negative.  Negative for dizziness and headaches.  All other systems reviewed and are negative.  Today's Vitals   10/19/22 1352  BP: 132/76  Pulse: 83  Temp: 98.3 F (36.8 C)  TempSrc: Oral  SpO2: 93%  Weight: (!) 435 lb (197.3 kg)  Height: '6\' 5"'$  (1.956 m)   Body mass index is 51.58 kg/m.   Physical Exam Vitals reviewed.  Constitutional:      Appearance: Normal appearance. He is obese.  HENT:     Head: Normocephalic.     Mouth/Throat:     Mouth: Mucous membranes are moist.     Pharynx: Oropharynx is clear.  Eyes:     Extraocular Movements: Extraocular movements intact.     Conjunctiva/sclera: Conjunctivae normal.     Pupils: Pupils are equal, round, and reactive to light.  Cardiovascular:     Rate and Rhythm: Normal rate and regular rhythm.     Pulses: Normal pulses.     Heart sounds: Normal heart sounds.  Pulmonary:     Effort: Pulmonary effort is normal.     Breath sounds: Normal breath sounds.  Musculoskeletal:     Cervical back: No tenderness.  Lymphadenopathy:     Cervical: No cervical adenopathy.  Skin:    General: Skin is warm and dry.  Neurological:     General: No focal deficit present.     Mental Status: He is alert and oriented to person, place, and time.  Psychiatric:        Mood and Affect: Mood normal.        Behavior: Behavior normal.      ASSESSMENT & PLAN: Problem List Items Addressed This Visit       Respiratory   Chest congestion    Continue over-the-counter Mucinex DM. No wheezing on physical examination No signs of congestive heart failure No rales no peripheral edema      Lower respiratory infection - Primary    Productive cough.  Clinically stable. No red flag signs or symptoms. Recommend daily azithromycin for 5 days. ED precautions given Advised to contact the office if no better or  worse during the next several days.      Relevant Medications   azithromycin (ZITHROMAX) 250 MG tablet     Other   Acute cough    Cough management discussed. Continue over-the-counter Mucinex dm and cough drops Advised to stay well-hydrated and rest      Patient Instructions  Acute Bronchitis, Adult  Acute bronchitis is when air tubes in the lungs (bronchi) suddenly get swollen. The condition can make it hard for you to breathe. In adults, acute bronchitis usually goes away within 2 weeks. A cough caused by bronchitis may last up to 3 weeks. Smoking, allergies, and asthma can make the condition worse. What are the causes? Germs that cause cold and flu (viruses). The most common cause of this condition is the virus that causes the common cold. Bacteria. Substances that bother (irritate) the lungs, including: Smoke from cigarettes and other types of tobacco. Dust and pollen. Fumes from chemicals, gases, or burned fuel. Indoor or outdoor air pollution. What increases the risk? A weak body's defense system. This is also called the immune system. Any condition that affects your lungs and breathing, such as asthma. What are the signs or symptoms? A cough. Coughing up clear, yellow, or  green mucus. Making high-pitched whistling sounds when you breathe, most often when you breathe out (wheezing). Runny or stuffy nose. Having too much mucus in your lungs (chest congestion). Shortness of breath. Body aches. A sore throat. How is this treated? Acute bronchitis may go away over time without treatment. Your doctor may tell you to: Drink more fluids. This will help thin your mucus so it is easier to cough up. Use a device that gets medicine into your lungs (inhaler). Use a vaporizer or a humidifier. These are machines that add water to the air. This helps with coughing and poor breathing. Take a medicine that thins mucus and helps clear it from your lungs. Take a medicine that prevents  or stops coughing. It is not common to take an antibiotic medicine for this condition. Follow these instructions at home:  Take over-the-counter and prescription medicines only as told by your doctor. Use an inhaler, vaporizer, or humidifier as told by your doctor. Take two teaspoons (10 mL) of honey at bedtime. This helps lessen your coughing at night. Drink enough fluid to keep your pee (urine) pale yellow. Do not smoke or use any products that contain nicotine or tobacco. If you need help quitting, ask your doctor. Get a lot of rest. Return to your normal activities when your doctor says that it is safe. Keep all follow-up visits. How is this prevented?  Wash your hands often with soap and water for at least 20 seconds. If you cannot use soap and water, use hand sanitizer. Avoid contact with people who have cold symptoms. Try not to touch your mouth, nose, or eyes with your hands. Avoid breathing in smoke or chemical fumes. Make sure to get the flu shot every year. Contact a doctor if: Your symptoms do not get better in 2 weeks. You have trouble coughing up the mucus. Your cough keeps you awake at night. You have a fever. Get help right away if: You cough up blood. You have chest pain. You have very bad shortness of breath. You faint or keep feeling like you are going to faint. You have a very bad headache. Your fever or chills get worse. These symptoms may be an emergency. Get help right away. Call your local emergency services (911 in the [place] Do not wait to see if the symptoms will go away. Do not drive yourself to the hospital. Summary Acute bronchitis is when air tubes in the lungs (bronchi) suddenly get swollen. In adults, acute bronchitis usually goes away within 2 weeks. Drink more fluids. This will help thin your mucus so it is easier to cough up. Take over-the-counter and prescription medicines only as told by your doctor. Contact a doctor if your symptoms do not  improve after 2 weeks of treatment. This information is not intended to replace advice given to you by your health care provider. Make sure you discuss any questions you have with your health care provider. Document Revised: 12/03/2020 Document Reviewed: 12/03/2020 Elsevier Patient Education  [place], MD [facility]

## 2022-10-19 NOTE — Assessment & Plan Note (Signed)
Cough management discussed. Continue over-the-counter Mucinex dm and cough drops Advised to stay well-hydrated and rest

## 2022-10-19 NOTE — Addendum Note (Signed)
Addended by: Davina Poke on: 10/19/2022 04:49 PM   Modules accepted: Level of Service

## 2022-10-19 NOTE — Patient Instructions (Signed)
  Acute Bronchitis, Adult  Acute bronchitis is when air tubes in the lungs (bronchi) suddenly get swollen. The condition can make it hard for you to breathe. In adults, acute bronchitis usually goes away within 2 weeks. A cough caused by bronchitis may last up to 3 weeks. Smoking, allergies, and asthma can make the condition worse. What are the causes? Germs that cause cold and flu (viruses). The most common cause of this condition is the virus that causes the common cold. Bacteria. Substances that bother (irritate) the lungs, including: Smoke from cigarettes and other types of tobacco. Dust and pollen. Fumes from chemicals, gases, or burned fuel. Indoor or outdoor air pollution. What increases the risk? A weak body's defense system. This is also called the immune system. Any condition that affects your lungs and breathing, such as asthma. What are the signs or symptoms? A cough. Coughing up clear, yellow, or green mucus. Making high-pitched whistling sounds when you breathe, most often when you breathe out (wheezing). Runny or stuffy nose. Having too much mucus in your lungs (chest congestion). Shortness of breath. Body aches. A sore throat. How is this treated? Acute bronchitis may go away over time without treatment. Your doctor may tell you to: Drink more fluids. This will help thin your mucus so it is easier to cough up. Use a device that gets medicine into your lungs (inhaler). Use a vaporizer or a humidifier. These are machines that add water to the air. This helps with coughing and poor breathing. Take a medicine that thins mucus and helps clear it from your lungs. Take a medicine that prevents or stops coughing. It is not common to take an antibiotic medicine for this condition. Follow these instructions at home:  Take over-the-counter and prescription medicines only as told by your doctor. Use an inhaler, vaporizer, or humidifier as told by your doctor. Take two  teaspoons (10 mL) of honey at bedtime. This helps lessen your coughing at night. Drink enough fluid to keep your pee (urine) pale yellow. Do not smoke or use any products that contain nicotine or tobacco. If you need help quitting, ask your doctor. Get a lot of rest. Return to your normal activities when your doctor says that it is safe. Keep all follow-up visits. How is this prevented?  Wash your hands often with soap and water for at least 20 seconds. If you cannot use soap and water, use hand sanitizer. Avoid contact with people who have cold symptoms. Try not to touch your mouth, nose, or eyes with your hands. Avoid breathing in smoke or chemical fumes. Make sure to get the flu shot every year. Contact a doctor if: Your symptoms do not get better in 2 weeks. You have trouble coughing up the mucus. Your cough keeps you awake at night. You have a fever. Get help right away if: You cough up blood. You have chest pain. You have very bad shortness of breath. You faint or keep feeling like you are going to faint. You have a very bad headache. Your fever or chills get worse. These symptoms may be an emergency. Get help right away. Call your local emergency services (911 in the [place] Do not wait to see if the symptoms will go away. Do not drive yourself to the hospital. Summary Acute bronchitis is when air tubes in the lungs (bronchi) suddenly get swollen. In adults, acute bronchitis usually goes away within 2 weeks. Drink more fluids. This will help thin your mucus so it   is easier to cough up. Take over-the-counter and prescription medicines only as told by your doctor. Contact a doctor if your symptoms do not improve after 2 weeks of treatment. This information is not intended to replace advice given to you by your health care provider. Make sure you discuss any questions you have with your health care provider. Document Revised: 12/03/2020 Document Reviewed: 12/03/2020 Elsevier  Patient Education  2023 Elsevier Inc[employer]  

## 2022-10-19 NOTE — Assessment & Plan Note (Signed)
Productive cough.  Clinically stable. No red flag signs or symptoms. Recommend daily azithromycin for 5 days. ED precautions given Advised to contact the office if no better or worse during the next several days.

## 2022-10-25 DIAGNOSIS — M5451 Vertebrogenic low back pain: Secondary | ICD-10-CM | POA: Diagnosis not present

## 2022-10-25 DIAGNOSIS — M9903 Segmental and somatic dysfunction of lumbar region: Secondary | ICD-10-CM | POA: Diagnosis not present

## 2022-10-25 DIAGNOSIS — M9901 Segmental and somatic dysfunction of cervical region: Secondary | ICD-10-CM | POA: Diagnosis not present

## 2022-10-25 DIAGNOSIS — M25552 Pain in left hip: Secondary | ICD-10-CM | POA: Diagnosis not present

## 2022-10-27 DIAGNOSIS — M9901 Segmental and somatic dysfunction of cervical region: Secondary | ICD-10-CM | POA: Diagnosis not present

## 2022-10-27 DIAGNOSIS — M9903 Segmental and somatic dysfunction of lumbar region: Secondary | ICD-10-CM | POA: Diagnosis not present

## 2022-10-27 DIAGNOSIS — M5451 Vertebrogenic low back pain: Secondary | ICD-10-CM | POA: Diagnosis not present

## 2022-10-27 DIAGNOSIS — M25552 Pain in left hip: Secondary | ICD-10-CM | POA: Diagnosis not present

## 2022-11-01 DIAGNOSIS — M9901 Segmental and somatic dysfunction of cervical region: Secondary | ICD-10-CM | POA: Diagnosis not present

## 2022-11-01 DIAGNOSIS — M25552 Pain in left hip: Secondary | ICD-10-CM | POA: Diagnosis not present

## 2022-11-01 DIAGNOSIS — M5451 Vertebrogenic low back pain: Secondary | ICD-10-CM | POA: Diagnosis not present

## 2022-11-01 DIAGNOSIS — M9903 Segmental and somatic dysfunction of lumbar region: Secondary | ICD-10-CM | POA: Diagnosis not present

## 2022-11-21 ENCOUNTER — Other Ambulatory Visit: Payer: Self-pay | Admitting: Emergency Medicine

## 2022-11-21 DIAGNOSIS — R0989 Other specified symptoms and signs involving the circulatory and respiratory systems: Secondary | ICD-10-CM

## 2022-12-01 ENCOUNTER — Other Ambulatory Visit: Payer: Self-pay | Admitting: Emergency Medicine

## 2022-12-01 DIAGNOSIS — G8929 Other chronic pain: Secondary | ICD-10-CM

## 2022-12-07 ENCOUNTER — Ambulatory Visit: Payer: Medicare PPO | Admitting: Emergency Medicine

## 2022-12-19 ENCOUNTER — Other Ambulatory Visit: Payer: Self-pay | Admitting: Emergency Medicine

## 2022-12-19 DIAGNOSIS — G6289 Other specified polyneuropathies: Secondary | ICD-10-CM

## 2023-01-11 ENCOUNTER — Encounter: Payer: Self-pay | Admitting: Emergency Medicine

## 2023-01-11 ENCOUNTER — Ambulatory Visit: Payer: Medicare PPO | Admitting: Emergency Medicine

## 2023-01-11 VITALS — BP 124/78 | HR 84 | Temp 98.5°F | Ht 77.0 in | Wt >= 6400 oz

## 2023-01-11 DIAGNOSIS — I872 Venous insufficiency (chronic) (peripheral): Secondary | ICD-10-CM

## 2023-01-11 DIAGNOSIS — J984 Other disorders of lung: Secondary | ICD-10-CM | POA: Diagnosis not present

## 2023-01-11 DIAGNOSIS — I739 Peripheral vascular disease, unspecified: Secondary | ICD-10-CM

## 2023-01-11 DIAGNOSIS — M48062 Spinal stenosis, lumbar region with neurogenic claudication: Secondary | ICD-10-CM

## 2023-01-11 DIAGNOSIS — N1831 Chronic kidney disease, stage 3a: Secondary | ICD-10-CM | POA: Diagnosis not present

## 2023-01-11 DIAGNOSIS — E1142 Type 2 diabetes mellitus with diabetic polyneuropathy: Secondary | ICD-10-CM | POA: Diagnosis not present

## 2023-01-11 DIAGNOSIS — G4730 Sleep apnea, unspecified: Secondary | ICD-10-CM | POA: Diagnosis not present

## 2023-01-11 DIAGNOSIS — E785 Hyperlipidemia, unspecified: Secondary | ICD-10-CM

## 2023-01-11 DIAGNOSIS — M5136 Other intervertebral disc degeneration, lumbar region: Secondary | ICD-10-CM

## 2023-01-11 DIAGNOSIS — E034 Atrophy of thyroid (acquired): Secondary | ICD-10-CM | POA: Diagnosis not present

## 2023-01-11 DIAGNOSIS — Z794 Long term (current) use of insulin: Secondary | ICD-10-CM

## 2023-01-11 DIAGNOSIS — E1169 Type 2 diabetes mellitus with other specified complication: Secondary | ICD-10-CM

## 2023-01-11 DIAGNOSIS — E1159 Type 2 diabetes mellitus with other circulatory complications: Secondary | ICD-10-CM

## 2023-01-11 DIAGNOSIS — Z7984 Long term (current) use of oral hypoglycemic drugs: Secondary | ICD-10-CM

## 2023-01-11 DIAGNOSIS — I152 Hypertension secondary to endocrine disorders: Secondary | ICD-10-CM | POA: Diagnosis not present

## 2023-01-11 LAB — MICROALBUMIN / CREATININE URINE RATIO
Creatinine,U: 101 mg/dL
Microalb Creat Ratio: 10 mg/g (ref 0.0–30.0)
Microalb, Ur: 10.1 mg/dL — ABNORMAL HIGH (ref 0.0–1.9)

## 2023-01-11 LAB — COMPREHENSIVE METABOLIC PANEL
ALT: 22 U/L (ref 0–53)
AST: 21 U/L (ref 0–37)
Albumin: 4.1 g/dL (ref 3.5–5.2)
Alkaline Phosphatase: 47 U/L (ref 39–117)
BUN: 31 mg/dL — ABNORMAL HIGH (ref 6–23)
CO2: 30 mEq/L (ref 19–32)
Calcium: 9.9 mg/dL (ref 8.4–10.5)
Chloride: 99 mEq/L (ref 96–112)
Creatinine, Ser: 1.23 mg/dL (ref 0.40–1.50)
GFR: 63.18 mL/min (ref >60.00)
Glucose, Bld: 126 mg/dL — ABNORMAL HIGH (ref 70–99)
Potassium: 4.4 mEq/L (ref 3.5–5.1)
Sodium: 138 mEq/L (ref 135–145)
Total Bilirubin: 0.4 mg/dL (ref 0.2–1.2)
Total Protein: 7.2 g/dL (ref 6.0–8.3)

## 2023-01-11 LAB — LDL CHOLESTEROL, DIRECT: Direct LDL: 94 mg/dL

## 2023-01-11 LAB — LIPID PANEL
Cholesterol: 195 mg/dL (ref 0–200)
HDL: 43.9 mg/dL (ref >39.00)
Total CHOL/HDL Ratio: 4
Triglycerides: 522 mg/dL — ABNORMAL HIGH (ref 0.0–149.0)

## 2023-01-11 LAB — CBC WITH DIFFERENTIAL/PLATELET
Basophils Absolute: 0.1 10*3/uL (ref 0.0–0.1)
Basophils Relative: 1.3 % (ref 0.0–3.0)
Eosinophils Absolute: 0.2 10*3/uL (ref 0.0–0.7)
Eosinophils Relative: 2.5 % (ref 0.0–5.0)
HCT: 43.4 % (ref 39.0–52.0)
Hemoglobin: 13.9 g/dL (ref 13.0–17.0)
Lymphocytes Relative: 17.2 % (ref 12.0–46.0)
Lymphs Abs: 1.6 10*3/uL (ref 0.7–4.0)
MCHC: 32 g/dL (ref 30.0–36.0)
MCV: 91.3 fl (ref 78.0–100.0)
Monocytes Absolute: 0.9 10*3/uL (ref 0.1–1.0)
Monocytes Relative: 9.7 % (ref 3.0–12.0)
Neutro Abs: 6.5 10*3/uL (ref 1.4–7.7)
Neutrophils Relative %: 69.3 % (ref 43.0–77.0)
Platelets: 266 10*3/uL (ref 150.0–400.0)
RBC: 4.75 Mil/uL (ref 4.22–5.81)
RDW: 14.3 % (ref 11.5–15.5)
WBC: 9.4 10*3/uL (ref 4.0–10.5)

## 2023-01-11 LAB — POCT GLYCOSYLATED HEMOGLOBIN (HGB A1C): Hemoglobin A1C: 7.5 % — AB (ref 4.0–5.6)

## 2023-01-11 MED ORDER — TRELEGY ELLIPTA 100-62.5-25 MCG/ACT IN AEPB: 1.0000 | INHALATION_SPRAY | Freq: Every day | RESPIRATORY_TRACT | 11 refills | Status: DC

## 2023-01-11 NOTE — Assessment & Plan Note (Signed)
Responded well to Trelegy in the past Reports dyspnea on exertion and decreased exercise tolerance May benefit from a starting daily Trelegy again.

## 2023-01-11 NOTE — Patient Instructions (Signed)
Health Maintenance, Male Adopting a healthy lifestyle and getting preventive care are important in promoting health and wellness. Ask your health care provider about: The right schedule for you to have regular tests and exams. Things you can do on your own to prevent diseases and keep yourself healthy. What should I know about diet, weight, and exercise? Eat a healthy diet  Eat a diet that includes plenty of vegetables, fruits, low-fat dairy products, and lean protein. Do not eat a lot of foods that are high in solid fats, added sugars, or sodium. Maintain a healthy weight Body mass index (BMI) is a measurement that can be used to identify possible weight problems. It estimates body fat based on height and weight. Your health care provider can help determine your BMI and help you achieve or maintain a healthy weight. Get regular exercise Get regular exercise. This is one of the most important things you can do for your health. Most adults should: Exercise for at least 150 minutes each week. The exercise should increase your heart rate and make you sweat (moderate-intensity exercise). Do strengthening exercises at least twice a week. This is in addition to the moderate-intensity exercise. Spend less time sitting. Even light physical activity can be beneficial. Watch cholesterol and blood lipids Have your blood tested for lipids and cholesterol at 62 years of age, then have this test every 5 years. You may need to have your cholesterol levels checked more often if: Your lipid or cholesterol levels are high. You are older than 62 years of age. You are at high risk for heart disease. What should I know about cancer screening? Many types of cancers can be detected early and may often be prevented. Depending on your health history and family history, you may need to have cancer screening at various ages. This may include screening for: Colorectal cancer. Prostate cancer. Skin cancer. Lung  cancer. What should I know about heart disease, diabetes, and high blood pressure? Blood pressure and heart disease High blood pressure causes heart disease and increases the risk of stroke. This is more likely to develop in people who have high blood pressure readings or are overweight. Talk with your health care provider about your target blood pressure readings. Have your blood pressure checked: Every 3-5 years if you are 18-39 years of age. Every year if you are 40 years old or older. If you are between the ages of 65 and 75 and are a current or former smoker, ask your health care provider if you should have a one-time screening for abdominal aortic aneurysm (AAA). Diabetes Have regular diabetes screenings. This checks your fasting blood sugar level. Have the screening done: Once every three years after age 45 if you are at a normal weight and have a low risk for diabetes. More often and at a younger age if you are overweight or have a high risk for diabetes. What should I know about preventing infection? Hepatitis B If you have a higher risk for hepatitis B, you should be screened for this virus. Talk with your health care provider to find out if you are at risk for hepatitis B infection. Hepatitis C Blood testing is recommended for: Everyone born from 1945 through 1965. Anyone with known risk factors for hepatitis C. Sexually transmitted infections (STIs) You should be screened each year for STIs, including gonorrhea and chlamydia, if: You are sexually active and are younger than 62 years of age. You are older than 62 years of age and your   health care provider tells you that you are at risk for this type of infection. Your sexual activity has changed since you were last screened, and you are at increased risk for chlamydia or gonorrhea. Ask your health care provider if you are at risk. Ask your health care provider about whether you are at high risk for HIV. Your health care provider  may recommend a prescription medicine to help prevent HIV infection. If you choose to take medicine to prevent HIV, you should first get tested for HIV. You should then be tested every 3 months for as long as you are taking the medicine. Follow these instructions at home: Alcohol use Do not drink alcohol if your health care provider tells you not to drink. If you drink alcohol: Limit how much you have to 0-2 drinks a day. Know how much alcohol is in your drink. In the [place] one drink equals one 12 oz bottle of beer (355 mL), one 5 oz glass of wine (148 mL), or one 1 oz glass of hard liquor (44 mL). Lifestyle Do not use any products that contain nicotine or tobacco. These products include cigarettes, chewing tobacco, and vaping devices, such as e-cigarettes. If you need help quitting, ask your health care provider. Do not use street drugs. Do not share needles. Ask your health care provider for help if you need support or information about quitting drugs. General instructions Schedule regular health, dental, and eye exams. Stay current with your vaccines. Tell your health care provider if: You often feel depressed. You have ever been abused or do not feel safe at home. Summary Adopting a healthy lifestyle and getting preventive care are important in promoting health and wellness. Follow your health care provider's instructions about healthy diet, exercising, and getting tested or screened for diseases. Follow your health care provider's instructions on monitoring your cholesterol and blood pressure. This information is not intended to replace advice given to you by your health care provider. Make sure you discuss any questions you have with your health care provider. Document Revised: 12/22/2020 Document Reviewed: 12/22/2020 [employer] Patient Education  2024 [employer]  

## 2023-01-11 NOTE — Assessment & Plan Note (Signed)
Advised to decrease amount of daily carbohydrate intake and daily calories and increase amount of plant based protein in his diet. 

## 2023-01-11 NOTE — Assessment & Plan Note (Signed)
Chronic back pain, stable. 

## 2023-01-11 NOTE — Assessment & Plan Note (Signed)
Well-controlled hypertension Continue amlodipine 10 mg, Benicar HCT 40-25 mg daily Hemoglobin A1c at 7.5, not at goal Sees endocrinologist on a regular basis. On insulin twice a day and metformin 1000 mg twice a day Also on Jardiance 25 mg daily Medications handled by endocrinologist office

## 2023-01-11 NOTE — Assessment & Plan Note (Signed)
TSH done today Continue Synthroid 137 mcg daily Will adjust dose accordingly

## 2023-01-11 NOTE — Assessment & Plan Note (Signed)
Advised to stay well-hydrated and avoid NSAIDs. ?

## 2023-01-11 NOTE — Assessment & Plan Note (Signed)
Stable. Not taking gabapentin

## 2023-01-11 NOTE — Progress Notes (Signed)
Johnny Andrews 62 y.o.   Chief Complaint  Patient presents with   Medical Management of Chronic Issues    1month f/u. Pt states no other concerns.     HISTORY OF PRESENT ILLNESS: This is a 62 y.o. male here for 40-month follow-up of chronic medical conditions Has no complaints or any other medical concerns today.  HPI   Prior to Admission medications   Medication Sig Start Date End Date Taking? Authorizing Provider  albuterol (VENTOLIN HFA) 108 (90 Base) MCG/ACT inhaler Inhale 2 puffs into the lungs every 4 (four) hours as needed for wheezing or shortness of breath. 08/26/22  Yes Toshiyuki Fredell, Eilleen Kempf, MD  ALPRAZolam Prudy Feeler) 0.5 MG tablet Take 1 tablet (0.5 mg total) by mouth 2 (two) times daily as needed for anxiety. 08/26/22  Yes Kamla Skilton, Eilleen Kempf, MD  amLODipine (NORVASC) 10 MG tablet Take 10 mg by mouth daily. 11/01/22  Yes [provider]  azithromycin (ZITHROMAX) 250 MG tablet Sig as indicated 10/19/22  Yes Graysen Woodyard, Eilleen Kempf, MD  blood glucose meter kit and supplies 1 each by Other route 2 (two) times daily. 03/14/20  Yes Georgina Quint, MD  diclofenac (VOLTAREN) 75 MG EC tablet TAKE 1 TABLET BY MOUTH TWICE A DAY 12/01/22  Yes Meiling Hendriks, Eilleen Kempf, MD  empagliflozin (JARDIANCE) 25 MG TABS tablet Take 25 mg by mouth daily.   Yes [provider]  fenofibrate micronized (LOFIBRA) 134 MG capsule Take 134 mg by mouth daily. 01/07/23  Yes [provider]  furosemide (LASIX) 40 MG tablet TAKE 1 TABLET BY MOUTH EVERY DAY 11/21/22  Yes Siddhi Dornbush, [place], MD  glucose blood (ONE TOUCH ULTRA TEST) test strip Two times a day for ULTRA 2 ONE TOUCH 02/28/20  Yes L[place]leton Haub, Eilleen Kempf, MD  insulin aspart (NOVOLOG) 100 UNIT/ML FlexPen Inject 75 Units into the skin 2 (two) times daily before a meal. 30 units am and 50 units pm   Yes [provider]  Insulin Pen Needle (BD ULTRA-FINE PEN NEEDLES) 29G X 12.7MM MISC USE TWO CHECK BLOOD SUGAR TWICE A DAY.  APPOINTMENT NEEDED FOR FURTHER REFILLS 02/28/20  Yes Georgina Quint, MD  levothyroxine (SYNTHROID) 137 MCG tablet Take 2 tablets (274 mcg total) by mouth daily before breakfast. 08/26/22 08/21/23 Yes Shakeya Kerkman, Eilleen Kempf, MD  losartan-hydrochlorothiazide (HYZAAR) 100-12.5 MG tablet Take 1 tablet by mouth daily. 08/28/20  Yes Diyana Starrett, Eilleen Kempf, MD  meclizine (ANTIVERT) 50 MG tablet Take 1 tablet (50 mg total) by mouth 3 (three) times daily as needed. 06/09/20  Yes [place] Dohmen, Eilleen Kempf, MD  metFORMIN (GLUCOPHAGE-XR) 500 MG 24 hr tablet Take 1,000 mg by mouth 2 (two) times daily.  09/15/15  Yes [provider]  olmesartan-hydrochlorothiazide (BENICAR HCT) 40-25 MG tablet Take 1 tablet by mouth daily. 01/07/23  Yes [provider]  Respiratory Therapy Supplies (ADULT MASK) MISC 2 Devices by Does not apply route once. 11/24/11  Yes Oretha Milch, MD  simvastatin (ZOCOR) 20 MG tablet Take 20 mg by mouth daily.   Yes [provider]  topiramate (TOPAMAX) 50 MG tablet TAKE 2 TABLETS BY MOUTH AT BEDTIME 12/19/22  Yes Hashir Deleeuw, Eilleen Kempf, MD  traZODone (DESYREL) 100 MG tablet TAKE 1 TABLET BY MOUTH EVERYDAY AT BEDTIME 12/19/22  Yes Burleigh Brockmann, [place], MD  TRESIBA FLEXTOUCH 200 UNIT/ML SOPN INJECT 100 UNITS TWICE A DAY 12/17/16  Yes [provider]  Fluticasone-Umeclidin-Vilant (TRELEGY ELLIPTA) 100-62.5-25 MCG/INH AEPB Inhale 1 puff into the lungs daily. Patient not taking:  Reported on 06/03/2022 10/22/20   Tomma Lightning, MD  gabapentin (NEURONTIN) 300 MG capsule Take 300 mg by mouth daily. Patient not taking: Reported on 10/19/2022 08/20/20   [provider]    Allergies  Allergen Reactions   Other     Dust mites    Patient Active Problem List   Diagnosis Date Noted   Stage 3a chronic kidney disease (HCC) 01/11/2023   Plantar fasciitis, bilateral 06/01/2021   Heel pain, bilateral 05/30/2021   Claudication (HCC) 07/08/2020   Chronic venous  insufficiency 07/25/2019   Encounter for orthopedic follow-up care 08/14/2018   Carpal tunnel syndrome of right wrist 04/24/2018   Diabetic peripheral neuropathy (HCC) 04/24/2018   Morbid obesity (HCC) 01/27/2018   Osteoarthritis of left knee 12/16/2017   Osteoarthritis of right knee 12/16/2017   Family history of prostate cancer 03/06/2017   Pure hypercholesterolemia 09/13/2016   Lumbar degenerative disc disease 07/22/2016   Spinal stenosis of lumbar region 11/03/2015   Abnormality of gait 09/15/2015   Restrictive lung disease 05/05/2015   Impotence of organic origin 02/05/2013   Hypogonadism male 02/05/2013   Elevated PSA 11/30/2011   GOUT 04/17/2010   SMOKER 04/17/2010   Sleep apnea 08/22/2009   Hypothyroidism 09/19/2007   Dyslipidemia associated with type 2 diabetes mellitus (HCC) 09/19/2007   Situational anxiety 09/19/2007   Hypertension associated with diabetes (HCC) 09/19/2007   Osteoarthritis 09/19/2007    Past Medical History:  Diagnosis Date   ALCOHOL USE 10/24/2008   ANXIETY 09/19/2007   Cough 05/22/2008   Diabetes mellitus without complication (HCC)    Phreesia 06/02/2020   DIABETES MELLITUS, TYPE II 09/19/2007   External thrombosed hemorrhoids 08/23/2008   GOUT 04/17/2010   HYPERLIPIDEMIA 09/19/2007   HYPERTENSION 09/19/2007   Hypertension    Phreesia 06/02/2020   HYPOTHYROIDISM 09/19/2007   Leg pain    OSTEOARTHRITIS 09/19/2007   OTHER DISEASES OF LUNG NOT ELSEWHERE CLASSIFIED 07/20/2010   Rash and other nonspecific skin eruption 09/19/2007   Restrictive lung disease    SCROTAL ABSCESS 05/27/2010   Sebaceous cyst 08/01/2008   SLEEP APNEA 08/22/2009   Sleep apnea    Phreesia 06/02/2020   SMOKER 04/17/2010   Thyroid disease    Phreesia 06/02/2020    Past Surgical History:  Procedure Laterality Date   COLONOSCOPY     NASAL SEPTUM SURGERY      Social History   Socioeconomic History   Marital status: Married    Spouse name: Not on file   Number of children: 2    Years of education: BS   Highest education level: Not on file  Occupational History   Occupation: Software engineer: [employer]  Tobacco Use   Smoking status: Former    Packs/day: 0.50    Years: 30.00    Additional pack years: 0.00    Total pack years: 15.00    Types: Cigarettes    Quit date: 2008    Years since quitting: 16.4   Smokeless tobacco: Never   Tobacco comments:    30 years at most off and on smoking 03/14/15  Substance and Sexual Activity   Alcohol use: Yes    Alcohol/week: 0.0 standard drinks of alcohol    Comment: 2 drinks per day   Drug use: Yes    Comment: Occasional Marijuana use   Sexual activity: Not on file  Other Topics Concern   Not on file  Social History Narrative   Originally from [place]. Has lived in [place],  [place], [place], & [place] since 1993. 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 Currently has a dog & cats. Previously had a parrot (conure) 12 years ago for 1 year but in same house.  Has also had excessive exposure to pigeon feces. He has had very brief exposure to asbestos around 2000. No hot tub exposure. He has inhaled chemical fumes/gases/refridgerants through his work.      Marital status: married x 26 years      Children: 2 children (19, 75); no grandchildren      Lives: Lives at home with his wife      Employment: [employer]      Tobacco: none; quit in 2013; smoked x 1/2 ppd x 20 years      Alcohol:  10 drinks; usually 2 drinks per day and then skip two days.      Drugs:  None      Exercise: none; job is physically demanding      Right-handed.   1 cup caffeine daily.   Social Determinants of Health   Financial Resource Strain: Low Risk  (06/15/2022)   Overall Financial Resource Strain (CARDIA)    Difficulty of Paying Living Expenses: Not hard at all  Food Insecurity: No Food Insecurity (06/15/2022)   Hunger Vital Sign    Worried About Running Out of Food in the Last Year: Never  true    Ran Out of Food in the Last Year: Never true  Transportation Needs: No Transportation Needs (06/15/2022)   PRAPARE - Administrator, Civil Service (Medical): No    Lack of Transportation (Non-Medical): No  Physical Activity: Inactive (06/15/2022)   Exercise Vital Sign    Days of Exercise per Week: 0 days    Minutes of Exercise per Session: 0 min  Stress: No Stress Concern Present (06/15/2022)   [employer] of Occupational Health - Occupational Stress Questionnaire    Feeling of Stress : Not at all  Social Connections: Unknown (06/15/2022)   Social Connection and Isolation Panel [NHANES]    Frequency of Communication with Friends and Family: Patient declined    Frequency of Social Gatherings with Friends and Family: Patient declined    Attends Religious Services: Patient declined    Database administrator or Organizations: Patient declined    Attends Banker Meetings: Patient declined    Marital Status: Married  Catering manager Violence: Not At Risk (06/15/2022)   Humiliation, Afraid, Rape, and Kick questionnaire    Fear of Current or Ex-Partner: No    Emotionally Abused: No    Physically Abused: No    Sexually Abused: No    Family History  Problem Relation Age of Onset   Breast cancer Mother        Breast Cancer   Parkinson's disease Mother    Heart disease Father        CABG, stenting cardiac   Cancer Brother 10       prostate cancer   Lung disease Neg Hx    Autoimmune disease Neg Hx      Review of Systems  Constitutional: Negative.  Negative for chills and fever.  HENT: Negative.  Negative for congestion and sore throat.   Respiratory: Negative.  Negative for cough and shortness of breath.   Cardiovascular: Negative.  Negative for chest pain and palpitations.  Gastrointestinal:  Negative for abdominal pain, diarrhea, nausea and vomiting.  Genitourinary: Negative.  Negative for dysuria and hematuria.  Musculoskeletal:  Positive  for back pain.  Skin: Negative.  Negative for rash.  Neurological: Negative.  Negative for dizziness and headaches.  All other systems reviewed and are negative.   Vitals:   01/11/23 1026  BP: 124/78  Pulse: 84  Temp: 98.5 F (36.9 C)  SpO2: 94%    Physical Exam Constitutional:      Appearance: He is obese.  HENT:     Head: Normocephalic.     Mouth/Throat:     Mouth: Mucous membranes are moist.     Pharynx: Oropharynx is clear.  Eyes:     Extraocular Movements: Extraocular movements intact.     Conjunctiva/sclera: Conjunctivae normal.     Pupils: Pupils are equal, round, and reactive to light.  Cardiovascular:     Rate and Rhythm: Normal rate and regular rhythm.     Pulses: Normal pulses.     Heart sounds: Normal heart sounds.  Pulmonary:     Effort: Pulmonary effort is normal.     Breath sounds: Normal breath sounds.  Abdominal:     Palpations: Abdomen is soft.     Tenderness: There is no abdominal tenderness.  Musculoskeletal:     Cervical back: No tenderness.     Right lower leg: No edema.     Left lower leg: No edema.  Lymphadenopathy:     Cervical: No cervical adenopathy.  Skin:    General: Skin is warm and dry.     Capillary Refill: Capillary refill takes less than 2 seconds.  Neurological:     Mental Status: He is alert and oriented to person, place, and time.  Psychiatric:        Mood and Affect: Mood normal.        Behavior: Behavior normal.      ASSESSMENT & PLAN: A total of 43 minutes was spent with the patient and counseling/coordination of care regarding preparing for this visit, review of most recent office visit notes, review of multiple chronic medical conditions under management, review of all medications, review of most recent blood work results, cardiovascular risks associated with diabetes and hypertension and dyslipidemia, education on nutrition, prognosis, documentation, and need for follow-up.  Problem List Items Addressed This Visit        Cardiovascular and Mediastinum   Hypertension associated with diabetes (HCC) - Primary    Well-controlled hypertension Continue amlodipine 10 mg, Benicar HCT 40-25 mg daily Hemoglobin A1c at 7.5, not at goal Sees endocrinologist on a regular basis. On insulin twice a day and metformin 1000 mg twice a day Also on Jardiance 25 mg daily Medications handled by endocrinologist office      Relevant Medications   amLODipine (NORVASC) 10 MG tablet   fenofibrate micronized (LOFIBRA) 134 MG capsule   olmesartan-hydrochlorothiazide (BENICAR HCT) 40-25 MG tablet   Other Relevant Orders   Urine Microalbumin w/creat. ratio   CBC with Differential/Platelet   Comprehensive metabolic panel   Lipid panel   Chronic venous insufficiency    Well-controlled.  No significant peripheral edema Continues Lasix 40 mg daily      Relevant Medications   amLODipine (NORVASC) 10 MG tablet   fenofibrate micronized (LOFIBRA) 134 MG capsule   olmesartan-hydrochlorothiazide (BENICAR HCT) 40-25 MG tablet     Respiratory   Sleep apnea    Stable.  On CPAP treatment.      Restrictive lung disease    Responded well to Trelegy in the past Reports dyspnea  on exertion and decreased exercise tolerance May benefit from a starting daily Trelegy again.      Relevant Medications   Fluticasone-Umeclidin-Vilant (TRELEGY ELLIPTA) 100-62.5-25 MCG/ACT AEPB     Endocrine   Hypothyroidism    TSH done today Continue Synthroid 137 mcg daily Will adjust dose accordingly      Relevant Orders   TSH   Dyslipidemia associated with type 2 diabetes mellitus (HCC)    Stable chronic condition Lipid profile done today Continue simvastatin 20 mg daily and fenofibrate 134 mg daily      Relevant Medications   fenofibrate micronized (LOFIBRA) 134 MG capsule   olmesartan-hydrochlorothiazide (BENICAR HCT) 40-25 MG tablet   Other Relevant Orders   POCT HgB A1C (Completed)   Urine Microalbumin w/creat. ratio   CBC with  Differential/Platelet   Comprehensive metabolic panel   Lipid panel   Diabetic peripheral neuropathy (HCC)    Stable. Not taking gabapentin      Relevant Medications   olmesartan-hydrochlorothiazide (BENICAR HCT) 40-25 MG tablet     Musculoskeletal and Integument   Lumbar degenerative disc disease    Chronic back pain, stable.        Genitourinary   Stage 3a chronic kidney disease (HCC)    Advised to stay well-hydrated and avoid NSAIDs        Other   Spinal stenosis of lumbar region    Creating chronic lumbar pain      Morbid obesity (HCC)    Advised to decrease amount of daily carbohydrate intake and daily calories and increase amount of plant-based protein in his diet      Claudication [facility])   Patient Instructions  Health Maintenance, Male Adopting a healthy lifestyle and getting preventive care are important in promoting health and wellness. Ask your health care provider about: The right schedule for you to have regular tests and exams. Things you can do on your own to prevent diseases and keep yourself healthy. What should I know about diet, weight, and exercise? Eat a healthy diet  Eat a diet that includes plenty of vegetables, fruits, low-fat dairy products, and lean protein. Do not eat a lot of foods that are high in solid fats, added sugars, or sodium. Maintain a healthy weight Body mass index (BMI) is a measurement that can be used to identify possible weight problems. It estimates body fat based on height and weight. Your health care provider can help determine your BMI and help you achieve or maintain a healthy weight. Get regular exercise Get regular exercise. This is one of the most important things you can do for your health. Most adults should: Exercise for at least 150 minutes each week. The exercise should increase your heart rate and make you sweat (moderate-intensity exercise). Do strengthening exercises at least twice a week. This is in addition to  the moderate-intensity exercise. Spend less time sitting. Even light physical activity can be beneficial. Watch cholesterol and blood lipids Have your blood tested for lipids and cholesterol at 62 years of age, then have this test every 5 years. You may need to have your cholesterol levels checked more often if: Your lipid or cholesterol levels are high. You are older than 62 years of age. You are at high risk for heart disease. What should I know about cancer screening? Many types of cancers can be detected early and may often be prevented. Depending on your health history and family history, you may need to have cancer screening at various ages. This may  include screening for: Colorectal cancer. Prostate cancer. Skin cancer. Lung cancer. What should I know about heart disease, diabetes, and high blood pressure? Blood pressure and heart disease High blood pressure causes heart disease and increases the risk of stroke. This is more likely to develop in people who have high blood pressure readings or are overweight. Talk with your health care provider about your target blood pressure readings. Have your blood pressure checked: Every 3-5 years if you are 79-2 years of age. Every year if you are 25 years old or older. If you are between the ages of 69 and 33 and are a current or former smoker, ask your health care provider if you should have a one-time screening for abdominal aortic aneurysm (AAA). Diabetes Have regular diabetes screenings. This checks your fasting blood sugar level. Have the screening done: Once every three years after age 72 if you are at a normal weight and have a low risk for diabetes. More often and at a younger age if you are overweight or have a high risk for diabetes. What should I know about preventing infection? Hepatitis B If you have a higher risk for hepatitis B, you should be screened for this virus. Talk with your health care provider to find out if you are  at risk for hepatitis B infection. Hepatitis C Blood testing is recommended for: Everyone born from [birth year] through 1965. Anyone with known risk factors for hepatitis C. Sexually transmitted infections (STIs) You should be screened each year for STIs, including gonorrhea and chlamydia, if: You are sexually active and are younger than 62 years of age. You are older than 62 years of age and your health care provider tells you that you are at risk for this type of infection. Your sexual activity has changed since you were last screened, and you are at increased risk for chlamydia or gonorrhea. Ask your health care provider if you are at risk. Ask your health care provider about whether you are at high risk for HIV. Your health care provider may recommend a prescription medicine to help prevent HIV infection. If you choose to take medicine to prevent HIV, you should first get tested for HIV. You should then be tested every 3 months for as long as you are taking the medicine. Follow these instructions at home: Alcohol use Do not drink alcohol if your health care provider tells you not to drink. If you drink alcohol: Limit how much you have to 0-2 drinks a day. Know how much alcohol is in your drink. In the [place] one drink equals one 12 oz bottle of beer (355 mL), one 5 oz glass of wine (148 mL), or one 1 oz glass of hard liquor (44 mL). Lifestyle Do not use any products that contain nicotine or tobacco. These products include cigarettes, chewing tobacco, and vaping devices, such as e-cigarettes. If you need help quitting, ask your health care provider. Do not use street drugs. Do not share needles. Ask your health care provider for help if you need support or information about quitting drugs. General instructions Schedule regular health, dental, and eye exams. Stay current with your vaccines. Tell your health care provider if: You often feel depressed. You have ever been abused or do not feel  safe at home. Summary Adopting a healthy lifestyle and getting preventive care are important in promoting health and wellness. Follow your health care provider's instructions about healthy diet, exercising, and getting tested or screened for diseases. Follow your health care  provider's instructions on monitoring your cholesterol and blood pressure. This information is not intended to replace advice given to you by your health care provider. Make sure you discuss any questions you have with your health care provider. Document Revised: 12/22/2020 Document Reviewed: 12/22/2020 Elsevier Patient Education  2024 Elsevier Inc.     Edwina Barth, MD [facility]

## 2023-01-11 NOTE — Assessment & Plan Note (Addendum)
Stable chronic condition Lipid profile done today Continue simvastatin 20 mg daily and fenofibrate 134 mg daily

## 2023-01-11 NOTE — Assessment & Plan Note (Signed)
Creating chronic lumbar pain

## 2023-01-11 NOTE — Assessment & Plan Note (Signed)
Stable.  On CPAP treatment. 

## 2023-01-11 NOTE — Assessment & Plan Note (Signed)
Well-controlled.  No significant peripheral edema Continues Lasix 40 mg daily

## 2023-01-25 DIAGNOSIS — M5451 Vertebrogenic low back pain: Secondary | ICD-10-CM | POA: Diagnosis not present

## 2023-01-25 DIAGNOSIS — M9903 Segmental and somatic dysfunction of lumbar region: Secondary | ICD-10-CM | POA: Diagnosis not present

## 2023-01-25 DIAGNOSIS — M9901 Segmental and somatic dysfunction of cervical region: Secondary | ICD-10-CM | POA: Diagnosis not present

## 2023-01-25 DIAGNOSIS — M25552 Pain in left hip: Secondary | ICD-10-CM | POA: Diagnosis not present

## 2023-01-27 DIAGNOSIS — M5442 Lumbago with sciatica, left side: Secondary | ICD-10-CM | POA: Diagnosis not present

## 2023-01-27 DIAGNOSIS — M9901 Segmental and somatic dysfunction of cervical region: Secondary | ICD-10-CM | POA: Diagnosis not present

## 2023-01-27 DIAGNOSIS — M25552 Pain in left hip: Secondary | ICD-10-CM | POA: Diagnosis not present

## 2023-01-27 DIAGNOSIS — M9903 Segmental and somatic dysfunction of lumbar region: Secondary | ICD-10-CM | POA: Diagnosis not present

## 2023-01-31 DIAGNOSIS — M25552 Pain in left hip: Secondary | ICD-10-CM | POA: Diagnosis not present

## 2023-01-31 DIAGNOSIS — M9901 Segmental and somatic dysfunction of cervical region: Secondary | ICD-10-CM | POA: Diagnosis not present

## 2023-01-31 DIAGNOSIS — M5442 Lumbago with sciatica, left side: Secondary | ICD-10-CM | POA: Diagnosis not present

## 2023-01-31 DIAGNOSIS — M9903 Segmental and somatic dysfunction of lumbar region: Secondary | ICD-10-CM | POA: Diagnosis not present

## 2023-02-14 DIAGNOSIS — M5442 Lumbago with sciatica, left side: Secondary | ICD-10-CM | POA: Diagnosis not present

## 2023-02-14 DIAGNOSIS — M25552 Pain in left hip: Secondary | ICD-10-CM | POA: Diagnosis not present

## 2023-02-14 DIAGNOSIS — M9901 Segmental and somatic dysfunction of cervical region: Secondary | ICD-10-CM | POA: Diagnosis not present

## 2023-02-14 DIAGNOSIS — M9903 Segmental and somatic dysfunction of lumbar region: Secondary | ICD-10-CM | POA: Diagnosis not present

## 2023-02-21 DIAGNOSIS — M9903 Segmental and somatic dysfunction of lumbar region: Secondary | ICD-10-CM | POA: Diagnosis not present

## 2023-02-21 DIAGNOSIS — M25552 Pain in left hip: Secondary | ICD-10-CM | POA: Diagnosis not present

## 2023-02-21 DIAGNOSIS — M5442 Lumbago with sciatica, left side: Secondary | ICD-10-CM | POA: Diagnosis not present

## 2023-02-21 DIAGNOSIS — M9901 Segmental and somatic dysfunction of cervical region: Secondary | ICD-10-CM | POA: Diagnosis not present

## 2023-03-03 DIAGNOSIS — M5416 Radiculopathy, lumbar region: Secondary | ICD-10-CM | POA: Diagnosis not present

## 2023-03-07 DIAGNOSIS — E781 Pure hyperglyceridemia: Secondary | ICD-10-CM | POA: Diagnosis not present

## 2023-03-07 DIAGNOSIS — E1165 Type 2 diabetes mellitus with hyperglycemia: Secondary | ICD-10-CM | POA: Diagnosis not present

## 2023-03-07 DIAGNOSIS — E039 Hypothyroidism, unspecified: Secondary | ICD-10-CM | POA: Diagnosis not present

## 2023-03-10 DIAGNOSIS — N189 Chronic kidney disease, unspecified: Secondary | ICD-10-CM | POA: Diagnosis not present

## 2023-03-10 DIAGNOSIS — E7800 Pure hypercholesterolemia, unspecified: Secondary | ICD-10-CM | POA: Diagnosis not present

## 2023-03-10 DIAGNOSIS — E039 Hypothyroidism, unspecified: Secondary | ICD-10-CM | POA: Diagnosis not present

## 2023-03-10 DIAGNOSIS — E1165 Type 2 diabetes mellitus with hyperglycemia: Secondary | ICD-10-CM | POA: Diagnosis not present

## 2023-03-10 DIAGNOSIS — E781 Pure hyperglyceridemia: Secondary | ICD-10-CM | POA: Diagnosis not present

## 2023-03-10 DIAGNOSIS — E1140 Type 2 diabetes mellitus with diabetic neuropathy, unspecified: Secondary | ICD-10-CM | POA: Diagnosis not present

## 2023-03-10 DIAGNOSIS — I10 Essential (primary) hypertension: Secondary | ICD-10-CM | POA: Diagnosis not present

## 2023-03-14 ENCOUNTER — Other Ambulatory Visit: Payer: Self-pay | Admitting: Emergency Medicine

## 2023-03-14 DIAGNOSIS — F418 Other specified anxiety disorders: Secondary | ICD-10-CM

## 2023-03-17 DIAGNOSIS — M9903 Segmental and somatic dysfunction of lumbar region: Secondary | ICD-10-CM | POA: Diagnosis not present

## 2023-03-17 DIAGNOSIS — M25552 Pain in left hip: Secondary | ICD-10-CM | POA: Diagnosis not present

## 2023-03-17 DIAGNOSIS — M9901 Segmental and somatic dysfunction of cervical region: Secondary | ICD-10-CM | POA: Diagnosis not present

## 2023-03-17 DIAGNOSIS — M5442 Lumbago with sciatica, left side: Secondary | ICD-10-CM | POA: Diagnosis not present

## 2023-03-22 ENCOUNTER — Encounter: Payer: Self-pay | Admitting: Emergency Medicine

## 2023-03-22 ENCOUNTER — Ambulatory Visit: Payer: Medicare PPO | Admitting: Emergency Medicine

## 2023-03-22 VITALS — BP 126/76 | HR 100 | Temp 98.3°F | Ht 77.0 in | Wt >= 6400 oz

## 2023-03-22 DIAGNOSIS — M48062 Spinal stenosis, lumbar region with neurogenic claudication: Secondary | ICD-10-CM

## 2023-03-22 DIAGNOSIS — M5136 Other intervertebral disc degeneration, lumbar region: Secondary | ICD-10-CM

## 2023-03-22 DIAGNOSIS — I872 Venous insufficiency (chronic) (peripheral): Secondary | ICD-10-CM

## 2023-03-22 DIAGNOSIS — E034 Atrophy of thyroid (acquired): Secondary | ICD-10-CM

## 2023-03-22 DIAGNOSIS — E1169 Type 2 diabetes mellitus with other specified complication: Secondary | ICD-10-CM | POA: Diagnosis not present

## 2023-03-22 DIAGNOSIS — E785 Hyperlipidemia, unspecified: Secondary | ICD-10-CM

## 2023-03-22 DIAGNOSIS — G6289 Other specified polyneuropathies: Secondary | ICD-10-CM | POA: Diagnosis not present

## 2023-03-22 DIAGNOSIS — E1142 Type 2 diabetes mellitus with diabetic polyneuropathy: Secondary | ICD-10-CM | POA: Diagnosis not present

## 2023-03-22 DIAGNOSIS — I152 Hypertension secondary to endocrine disorders: Secondary | ICD-10-CM

## 2023-03-22 DIAGNOSIS — N1831 Chronic kidney disease, stage 3a: Secondary | ICD-10-CM | POA: Diagnosis not present

## 2023-03-22 DIAGNOSIS — E1159 Type 2 diabetes mellitus with other circulatory complications: Secondary | ICD-10-CM

## 2023-03-22 DIAGNOSIS — M51369 Other intervertebral disc degeneration, lumbar region without mention of lumbar back pain or lower extremity pain: Secondary | ICD-10-CM

## 2023-03-22 MED ORDER — TOPIRAMATE 50 MG PO TABS: 100.0000 mg | ORAL_TABLET | Freq: Every day | ORAL | 1 refills | Status: DC

## 2023-03-22 NOTE — Assessment & Plan Note (Signed)
Contributing to chronic low back pain and compromised mobility

## 2023-03-22 NOTE — Patient Instructions (Signed)
Chronic Back Pain Chronic back pain is back pain that lasts longer than 3 months. The pain may get worse at certain times (flare-ups). There are things you can do at home to manage your pain. Follow these instructions at home: Watch for any changes in your symptoms. Take these actions to help with your pain: Managing pain and stiffness     If told, put ice on the painful area. You may be told to use ice for 24-48 hours after a flare-up starts. Put ice in a plastic bag. Place a towel between your skin and the bag. Leave the ice on for 20 minutes, 2-3 times a day. If told, put heat on the painful area. Do this as often as told by your doctor. Use the heat source that your doctor recommends, such as a moist heat pack or a heating pad. Place a towel between your skin and the heat source. Leave the heat on for 20-30 minutes. If your skin turns bright red, take off the ice or heat right away to prevent skin damage. The risk of damage is higher if you cannot feel pain, heat, or cold. Soak in a warm bath. This can help with pain. Activity        Avoid bending and other activities that make the pain worse. When you stand: Keep your upper back and neck straight. Keep your shoulders pulled back. Avoid slouching. When you sit: Keep your back straight. Relax your shoulders. Do not round your shoulders or pull them backward. Do not sit or stand in one place for too long. Take short rest breaks during the day. Lying down or standing is often better than sitting. Resting can help relieve pain. When sitting or lying down for a long time, do some mild activity or stretching. This will help to prevent stiffness and pain. Get regular exercise. Ask your doctor what activities are safe for you. You may have to avoid lifting. Ask your provider how much you can safely lift. If you lift things: Bend your knees. Keep the weight close to your body. Avoid twisting. Medicines Take over-the-counter and  prescription medicines only as told by your doctor. You may need to take medicines for pain and swelling. These may be taken by mouth or put on the skin. You may also be given muscle relaxants. Ask your doctor if the medicine prescribed to you: Requires you to avoid driving or using machinery. Can cause trouble pooping (constipation). You may need to take these actions to prevent or treat trouble pooping: Drink enough fluid to keep your pee (urine) pale yellow. Take over-the-counter or prescription medicines. Eat foods that are high in fiber. These include beans, whole grains, and fresh fruits and vegetables. Limit foods that are high in fat and sugars. These include fried or sweet foods. General instructions  Sleep on a firm mattress. Try lying on your side with your knees slightly bent. If you lie on your back, put a pillow under your knees. Do not smoke or use any products that contain nicotine or tobacco. If you need help quitting, ask your doctor. Contact a doctor if: Your pain does not get better with rest or medicine. You have new pain. You have a fever. You lose weight quickly. You have trouble doing your normal activities. One or both of your legs or feet feel weak. One or both of your legs or feet lose feeling (have numbness). Get help right away if: You are not able to control when you pee   or poop. You have bad back pain and: You feel like you may vomit (nauseous). You vomit. You have pain in your chest or your belly (abdomen). You have shortness of breath. You faint. These symptoms may be an emergency. Get help right away. Call 911. Do not wait to see if the symptoms will go away. Do not drive yourself to the hospital. This information is not intended to replace advice given to you by your health care provider. Make sure you discuss any questions you have with your health care provider. Document Revised: 03/22/2022 Document Reviewed: 03/22/2022 Elsevier Patient Education   2024 Elsevier Inc.  

## 2023-03-22 NOTE — Progress Notes (Signed)
Johnny Andrews 62 y.o.   Chief Complaint  Patient presents with   Back Pain    Some back pains,    Medical Management of Chronic Issues    Patient wants an order to be placed for a motorized scooter.     HISTORY OF PRESENT ILLNESS: This is a 62 y.o. male with history of chronic back pain and diabetes polyneuropathy.  Wheelchair-bound. Needs order for motorized scooter Diabetic.  Follows up with endocrinologist on a regular basis No other complaints or medical concerns today.  Back Pain Pertinent negatives include no abdominal pain, chest pain, dysuria, fever or headaches.     Prior to Admission medications   Medication Sig Start Date End Date Taking? Authorizing Provider  albuterol (VENTOLIN HFA) 108 (90 Base) MCG/ACT inhaler Inhale 2 puffs into the lungs every 4 (four) hours as needed for wheezing or shortness of breath. 08/26/22  Yes , Eilleen Kempf, MD  ALPRAZolam Prudy Feeler) 0.5 MG tablet TAKE 1 TABLET BY MOUTH 2 TIMES DAILY AS NEEDED FOR ANXIETY. 03/14/23  Yes , Eilleen Kempf, MD  amLODipine (NORVASC) 10 MG tablet Take 10 mg by mouth daily. 11/01/22  Yes [provider]  azithromycin (ZITHROMAX) 250 MG tablet Sig as indicated 10/19/22  Yes , Eilleen Kempf, MD  blood glucose meter kit and supplies 1 each by Other route 2 (two) times daily. 03/14/20  Yes Georgina Quint, MD  diclofenac (VOLTAREN) 75 MG EC tablet TAKE 1 TABLET BY MOUTH TWICE A DAY 12/01/22  Yes , Eilleen Kempf, MD  empagliflozin (JARDIANCE) 25 MG TABS tablet Take 25 mg by mouth daily.   Yes [provider]  fenofibrate micronized (LOFIBRA) 134 MG capsule Take 134 mg by mouth daily. 01/07/23  Yes [provider]  Fluticasone-Umeclidin-Vilant (TRELEGY ELLIPTA) 100-62.5-25 MCG/ACT AEPB Inhale 1 puff into the lungs daily. 01/11/23  Yes , Eilleen Kempf, MD  furosemide (LASIX) 40 MG tablet TAKE 1 TABLET BY MOUTH EVERY DAY 11/21/22  Yes , [place], MD   glucose blood (ONE TOUCH ULTRA TEST) test strip Two times a day for ULTRA 2 ONE TOUCH 02/28/20  Yes , Eilleen Kempf, MD  insulin aspart (NOVOLOG) 100 UNIT/ML FlexPen Inject 75 Units into the skin 2 (two) times daily before a meal. 30 units am and 50 units pm   Yes [provider]  Insulin Pen Needle (BD ULTRA-FINE PEN NEEDLES) 29G X 12.7MM MISC USE TWO CHECK BLOOD SUGAR TWICE A DAY. APPOINTMENT NEEDED FOR FURTHER REFILLS 02/28/20  Yes Georgina Quint, MD  levothyroxine (SYNTHROID) 137 MCG tablet Take 2 tablets (274 mcg total) by mouth daily before breakfast. 08/26/22 08/21/23 Yes , Eilleen Kempf, MD  meclizine (ANTIVERT) 50 MG tablet Take 1 tablet (50 mg total) by mouth 3 (three) times daily as needed. 06/09/20  Yes , Eilleen Kempf, MD  metFORMIN (GLUCOPHAGE-XR) 500 MG 24 hr tablet Take 1,000 mg by mouth 2 (two) times daily.  09/15/15  Yes [provider]  olmesartan-hydrochlorothiazide (BENICAR HCT) 40-25 MG tablet Take 1 tablet by mouth daily. 01/07/23  Yes [provider]  Respiratory Therapy Supplies (ADULT MASK) MISC 2 Devices by Does not apply route once. 11/24/11  Yes Oretha Milch, MD  simvastatin (ZOCOR) 20 MG tablet Take 20 mg by mouth daily.   Yes [provider]  traZODone (DESYREL) 100 MG tablet TAKE 1 TABLET BY MOUTH EVERYDAY AT BEDTIME 12/19/22  Yes , Eilleen Kempf, MD  TRESIBA FLEXTOUCH 200 UNIT/ML SOPN INJECT 100 UNITS TWICE A  DAY 12/17/16  Yes [provider]  gabapentin (NEURONTIN) 300 MG capsule Take 300 mg by mouth daily. Patient not taking: Reported on 10/19/2022 08/20/20   [provider]  topiramate (TOPAMAX) 50 MG tablet Take 2 tablets (100 mg total) by mouth at bedtime. 03/22/23   Georgina Quint, MD    Allergies  Allergen Reactions   Other     Dust mites    Patient Active Problem List   Diagnosis Date Noted   Stage 3a chronic kidney disease (HCC) 01/11/2023   Plantar fasciitis, bilateral  06/01/2021   Heel pain, bilateral 05/30/2021   Claudication (HCC) 07/08/2020   Chronic venous insufficiency 07/25/2019   Encounter for orthopedic follow-up care 08/14/2018   Carpal tunnel syndrome of right wrist 04/24/2018   Diabetic peripheral neuropathy (HCC) 04/24/2018   Morbid obesity (HCC) 01/27/2018   Osteoarthritis of left knee 12/16/2017   Osteoarthritis of right knee 12/16/2017   Family history of prostate cancer 03/06/2017   Pure hypercholesterolemia 09/13/2016   Lumbar degenerative disc disease 07/22/2016   Spinal stenosis of lumbar region 11/03/2015   Abnormality of gait 09/15/2015   Restrictive lung disease 05/05/2015   Impotence of organic origin 02/05/2013   Hypogonadism male 02/05/2013   Elevated PSA 11/30/2011   GOUT 04/17/2010   SMOKER 04/17/2010   Sleep apnea 08/22/2009   Hypothyroidism 09/19/2007   Dyslipidemia associated with type 2 diabetes mellitus (HCC) 09/19/2007   Situational anxiety 09/19/2007   Hypertension associated with diabetes (HCC) 09/19/2007   Osteoarthritis 09/19/2007    Past Medical History:  Diagnosis Date   ALCOHOL USE 10/24/2008   ANXIETY 09/19/2007   Cough 05/22/2008   Diabetes mellitus without complication (HCC)    Phreesia 06/02/2020   DIABETES MELLITUS, TYPE II 09/19/2007   External thrombosed hemorrhoids 08/23/2008   GOUT 04/17/2010   HYPERLIPIDEMIA 09/19/2007   HYPERTENSION 09/19/2007   Hypertension    Phreesia 06/02/2020   HYPOTHYROIDISM 09/19/2007   Leg pain    OSTEOARTHRITIS 09/19/2007   OTHER DISEASES OF LUNG NOT ELSEWHERE CLASSIFIED 07/20/2010   Rash and other nonspecific skin eruption 09/19/2007   Restrictive lung disease    SCROTAL ABSCESS 05/27/2010   Sebaceous cyst 08/01/2008   SLEEP APNEA 08/22/2009   Sleep apnea    [employer]reesia 06/02/2020   SMOKER 04/17/2010   Thyroid disease    [employer]reesia 06/02/2020    Past Surgical History:  Procedure Laterality Date   COLONOSCOPY     NASAL SEPTUM SURGERY      Social History    Socioeconomic History   Marital status: Married    Spouse name: Not on file   Number of children: 2   Years of education: BS   Highest education level: Not on file  Occupational History   Occupation: Software engineer: [employer]   Tobacco Use   Smoking status: Former    Current packs/day: 0.00    Average packs/day: 0.5 packs/day for 30.0 years (15.0 ttl pk-yrs)    Types: Cigarettes    Start date: 22    Quit date: 2008    Years since quitting: 16.6   Smokeless tobacco: Never   Tobacco comments:    30 years at most off and on smoking 03/14/15  Substance and Sexual Activity   Alcohol use: Yes    Alcohol/week: 0.0 standard drinks of alcohol    Comment: 2 drinks per day   Drug use: Yes    Comment: Occasional Marijuana use   Sexual activity: Not on file  Other Topics Concern   Not on file  Social History Narrative   Originally from [place]. Has lived in [place], [place], [place], & [place] since 1993. 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 Currently has a dog & cats. Previously had a parrot (conure) 12 years ago for 1 year but in same house.  Has also had excessive exposure to pigeon feces. He has had very brief exposure to asbestos around 2000. No hot tub exposure. He has inhaled chemical fumes/gases/refridgerants through his work.      Marital status: married x 26 years      Children: 2 children (19, 44); no grandchildren      Lives: Lives at home with his wife      Employment: [employer]      Tobacco: none; quit in 2013; smoked x 1/2 ppd x 20 years      Alcohol:  10 drinks; usually 2 drinks per day and then skip two days.      Drugs:  None      Exercise: none; job is physically demanding      Right-handed.   1 cup caffeine daily.   Social Determinants of Health   Financial Resource Strain: Low Risk  (06/15/2022)   Overall Financial Resource Strain (CARDIA)    Difficulty of Paying Living Expenses: Not hard at all   Food Insecurity: No Food Insecurity (06/15/2022)   Hunger Vital Sign    Worried About Running Out of Food in the Last Year: Never true    Ran Out of Food in the Last Year: Never true  Transportation Needs: No Transportation Needs (06/15/2022)   PRAPARE - Administrator, Civil Service (Medical): No    Lack of Transportation (Non-Medical): No  Physical Activity: Inactive (06/15/2022)   Exercise Vital Sign    Days of Exercise per Week: 0 days    Minutes of Exercise per Session: 0 min  Stress: No Stress Concern Present (06/15/2022)   [employer] of Occupational Health - Occupational Stress Questionnaire    Feeling of Stress : Not at all  Social Connections: Unknown (06/15/2022)   Social Connection and Isolation Panel [NHANES]    Frequency of Communication with Friends and Family: Patient declined    Frequency of Social Gatherings with Friends and Family: Patient declined    Attends Religious Services: Patient declined    Database administrator or Organizations: Patient declined    Attends Banker Meetings: Patient declined    Marital Status: Married  Catering manager Violence: Not At Risk (06/15/2022)   Humiliation, Afraid, Rape, and Kick questionnaire    Fear of Current or Ex-Partner: No    Emotionally Abused: No    Physically Abused: No    Sexually Abused: No    Family History  Problem Relation Age of Onset   Breast cancer Mother        Breast Cancer   Parkinson's disease Mother    Heart disease Father        CABG, stenting cardiac   Cancer Brother 62       prostate cancer   Lung disease Neg Hx    Autoimmune disease Neg Hx      Review of Systems  Constitutional: Negative.  Negative for chills and fever.  HENT: Negative.  Negative for congestion and sore throat.   Respiratory: Negative.  Negative for cough and shortness of breath.  Cardiovascular: Negative.  Negative for chest pain and palpitations.  Gastrointestinal:  Negative for  abdominal pain, diarrhea, nausea and vomiting.  Genitourinary: Negative.  Negative for dysuria.  Musculoskeletal:  Positive for back pain.  Skin: Negative.  Negative for rash.  Neurological: Negative.  Negative for dizziness and headaches.  All other systems reviewed and are negative.   Vitals:   03/22/23 1501  BP: 126/76  Pulse: 100  Temp: 98.3 F (36.8 C)  SpO2: 93%    Physical Exam Vitals reviewed.  Constitutional:      Appearance: Normal appearance. He is obese.  HENT:     Head: Normocephalic.     Mouth/Throat:     Mouth: Mucous membranes are moist.     Pharynx: Oropharynx is clear.  Eyes:     Extraocular Movements: Extraocular movements intact.     Pupils: Pupils are equal, round, and reactive to light.  Cardiovascular:     Rate and Rhythm: Normal rate and regular rhythm.     Pulses: Normal pulses.     Heart sounds: Normal heart sounds.  Pulmonary:     Effort: Pulmonary effort is normal.     Breath sounds: Normal breath sounds.  Abdominal:     Palpations: Abdomen is soft.     Tenderness: There is no abdominal tenderness.  Musculoskeletal:     Cervical back: No tenderness.     Right lower leg: Edema present.     Left lower leg: Edema present.  Lymphadenopathy:     Cervical: No cervical adenopathy.  Skin:    General: Skin is warm and dry.     Capillary Refill: Capillary refill takes less than 2 seconds.  Neurological:     Mental Status: He is alert and oriented to person, place, and time. Mental status is at baseline.  Psychiatric:        Mood and Affect: Mood normal.        Behavior: Behavior normal.      ASSESSMENT & PLAN: A total of 45 minutes was spent with the patient and counseling/coordination of care regarding preparing for this visit, review of most recent office visit notes, review of multiple chronic medical conditions and their management, review of most recent blood work results, review of all medications, pain management, education on  nutrition, cardiovascular risks associated with hypertension and diabetes, prognosis, documentation, and need for follow-up.  Problem List Items Addressed This Visit       Cardiovascular and Mediastinum   Hypertension associated with diabetes (HCC) - Primary    BP Readings from Last 3 Encounters:  03/22/23 126/76  01/11/23 124/78  10/19/22 132/76  Well-controlled hypertension Continue amlodipine 10 mg and Benicar HCT 40-25 mg daily Hemoglobin A1c is still not at goal. Follows up with endocrinologist on a regular basis who is handling his diabetes medications Presently on Tresiba 100 units twice a day, metformin 1000 mg twice a day, Jardiance 25 mg daily       Chronic venous insufficiency    With chronic lower leg edema Continues Lasix 40 mg daily        Endocrine   Hypothyroidism    Also followed up by endocrinologist Continue Synthroid 274 mcg daily      Dyslipidemia associated with type 2 diabetes mellitus (HCC)    Chronic stable condition Continues simvastatin 20 mg daily      Diabetic peripheral neuropathy (HCC)    With compromised function of lower legs.       Relevant Medications  topiramate (TOPAMAX) 50 MG tablet     Musculoskeletal and Integument   Lumbar degenerative disc disease    With resultant chronic low back pain and compromised mobility        Genitourinary   Stage 3a chronic kidney disease (HCC)    Advised to stay well-hydrated and avoid NSAIDs Continue Jardiance 25 mg daily        Other   Spinal stenosis of lumbar region    Contributing to chronic low back pain and compromised mobility      Morbid obesity (HCC)    Diet and nutrition discussed Advised to decrease amount of daily carbohydrate intake and daily calories and increase amount of plant-based protein in his diet      Other Visit Diagnoses     Other polyneuropathy       Relevant Medications   topiramate (TOPAMAX) 50 MG tablet      Patient Instructions  Chronic Back  Pain Chronic back pain is back pain that lasts longer than 3 months. The pain may get worse at certain times (flare-ups). There are things you can do at home to manage your pain. Follow these instructions at home: Watch for any changes in your symptoms. Take these actions to help with your pain: Managing pain and stiffness     If told, put ice on the painful area. You may be told to use ice for 24-48 hours after a flare-up starts. Put ice in a plastic bag. Place a towel between your skin and the bag. Leave the ice on for 20 minutes, 2-3 times a day. If told, put heat on the painful area. Do this as often as told by your doctor. Use the heat source that your doctor recommends, such as a moist heat pack or a heating pad. Place a towel between your skin and the heat source. Leave the heat on for 20-30 minutes. If your skin turns bright red, take off the ice or heat right away to prevent skin damage. The risk of damage is higher if you cannot feel pain, heat, or cold. Soak in a warm bath. This can help with pain. Activity        Avoid bending and other activities that make the pain worse. When you stand: Keep your upper back and neck straight. Keep your shoulders pulled back. Avoid slouching. When you sit: Keep your back straight. Relax your shoulders. Do not round your shoulders or pull them backward. Do not sit or stand in one place for too long. Take short rest breaks during the day. Lying down or standing is often better than sitting. Resting can help relieve pain. When sitting or lying down for a long time, do some mild activity or stretching. This will help to prevent stiffness and pain. Get regular exercise. Ask your doctor what activities are safe for you. You may have to avoid lifting. Ask your provider how much you can safely lift. If you lift things: Bend your knees. Keep the weight close to your body. Avoid twisting. Medicines Take over-the-counter and prescription  medicines only as told by your doctor. You may need to take medicines for pain and swelling. These may be taken by mouth or put on the skin. You may also be given muscle relaxants. Ask your doctor if the medicine prescribed to you: Requires you to avoid driving or using machinery. Can cause trouble pooping (constipation). You may need to take these actions to prevent or treat trouble pooping: Drink enough fluid to keep  your pee (urine) pale yellow. Take over-the-counter or prescription medicines. Eat foods that are high in fiber. These include beans, whole grains, and fresh fruits and vegetables. Limit foods that are high in fat and sugars. These include fried or sweet foods. General instructions  Sleep on a firm mattress. Try lying on your side with your knees slightly bent. If you lie on your back, put a pillow under your knees. Do not smoke or use any products that contain nicotine or tobacco. If you need help quitting, ask your doctor. Contact a doctor if: Your pain does not get better with rest or medicine. You have new pain. You have a fever. You lose weight quickly. You have trouble doing your normal activities. One or both of your legs or feet feel weak. One or both of your legs or feet lose feeling (have numbness). Get help right away if: You are not able to control when you pee or poop. You have bad back pain and: You feel like you may vomit (nauseous). You vomit. You have pain in your chest or your belly (abdomen). You have shortness of breath. You faint. These symptoms may be an emergency. Get help right away. Call 911. Do not wait to see if the symptoms will go away. Do not drive yourself to the hospital. This information is not intended to replace advice given to you by your health care provider. Make sure you discuss any questions you have with your health care provider. Document Revised: 03/22/2022 Document Reviewed: 03/22/2022 Elsevier Patient Education  2024  Elsevier Inc.      Edwina Barth, MD [facility]

## 2023-03-22 NOTE — Assessment & Plan Note (Signed)
With resultant chronic low back pain and compromised mobility

## 2023-03-22 NOTE — Assessment & Plan Note (Signed)
Chronic stable condition Continues simvastatin 20 mg daily

## 2023-03-22 NOTE — Assessment & Plan Note (Signed)
Diet and nutrition discussed.  Advised to decrease amount of daily carbohydrate intake and daily calories and increase amount of plant based protein in his diet 

## 2023-03-22 NOTE — Assessment & Plan Note (Signed)
With compromised function of lower legs.

## 2023-03-22 NOTE — Assessment & Plan Note (Signed)
BP Readings from Last 3 Encounters:  03/22/23 126/76  01/11/23 124/78  10/19/22 132/76  Well-controlled hypertension Continue amlodipine 10 mg and Benicar HCT 40-25 mg daily Hemoglobin A1c is still not at goal. Follows up with endocrinologist on a regular basis who is handling his diabetes medications Presently on Tresiba 100 units twice a day, metformin 1000 mg twice a day, Jardiance 25 mg daily

## 2023-03-22 NOTE — Assessment & Plan Note (Signed)
Also followed up by endocrinologist Continue Synthroid 274 mcg daily

## 2023-03-22 NOTE — Assessment & Plan Note (Signed)
Advised to stay well-hydrated and avoid NSAIDs. Continue Jardiance 25 mg daily 

## 2023-03-22 NOTE — Assessment & Plan Note (Signed)
With chronic lower leg edema Continues Lasix 40 mg daily

## 2023-03-27 ENCOUNTER — Encounter: Payer: Self-pay | Admitting: Emergency Medicine

## 2023-05-26 ENCOUNTER — Other Ambulatory Visit: Payer: Self-pay | Admitting: Emergency Medicine

## 2023-05-26 DIAGNOSIS — G8929 Other chronic pain: Secondary | ICD-10-CM

## 2023-05-29 ENCOUNTER — Other Ambulatory Visit: Payer: Self-pay | Admitting: Emergency Medicine

## 2023-06-06 ENCOUNTER — Other Ambulatory Visit: Payer: Self-pay | Admitting: Emergency Medicine

## 2023-06-06 DIAGNOSIS — F418 Other specified anxiety disorders: Secondary | ICD-10-CM

## 2023-07-12 ENCOUNTER — Ambulatory Visit: Payer: Medicare PPO | Admitting: Emergency Medicine

## 2023-07-12 ENCOUNTER — Encounter: Payer: Self-pay | Admitting: Emergency Medicine

## 2023-07-12 VITALS — BP 128/80 | HR 87 | Temp 98.5°F | Ht 77.0 in | Wt >= 6400 oz

## 2023-07-12 DIAGNOSIS — E1142 Type 2 diabetes mellitus with diabetic polyneuropathy: Secondary | ICD-10-CM

## 2023-07-12 DIAGNOSIS — I872 Venous insufficiency (chronic) (peripheral): Secondary | ICD-10-CM

## 2023-07-12 DIAGNOSIS — E034 Atrophy of thyroid (acquired): Secondary | ICD-10-CM | POA: Diagnosis not present

## 2023-07-12 DIAGNOSIS — M51362 Other intervertebral disc degeneration, lumbar region with discogenic back pain and lower extremity pain: Secondary | ICD-10-CM

## 2023-07-12 DIAGNOSIS — I152 Hypertension secondary to endocrine disorders: Secondary | ICD-10-CM

## 2023-07-12 DIAGNOSIS — G4730 Sleep apnea, unspecified: Secondary | ICD-10-CM

## 2023-07-12 DIAGNOSIS — E785 Hyperlipidemia, unspecified: Secondary | ICD-10-CM

## 2023-07-12 DIAGNOSIS — J984 Other disorders of lung: Secondary | ICD-10-CM

## 2023-07-12 DIAGNOSIS — E1159 Type 2 diabetes mellitus with other circulatory complications: Secondary | ICD-10-CM

## 2023-07-12 DIAGNOSIS — E1169 Type 2 diabetes mellitus with other specified complication: Secondary | ICD-10-CM

## 2023-07-12 DIAGNOSIS — M48062 Spinal stenosis, lumbar region with neurogenic claudication: Secondary | ICD-10-CM

## 2023-07-12 DIAGNOSIS — N1831 Chronic kidney disease, stage 3a: Secondary | ICD-10-CM

## 2023-07-12 LAB — POCT GLYCOSYLATED HEMOGLOBIN (HGB A1C): Hemoglobin A1C: 6.6 % — AB (ref 4.0–5.6)

## 2023-07-12 NOTE — Assessment & Plan Note (Signed)
Continues daily Trelegy and albuterol for rescue inhaler as needed Stable and well-controlled condition

## 2023-07-12 NOTE — Assessment & Plan Note (Signed)
Chronic stable condition Continues simvastatin 20 mg daily

## 2023-07-12 NOTE — Assessment & Plan Note (Signed)
With resultant chronic low back pain and compromised mobility

## 2023-07-12 NOTE — Assessment & Plan Note (Signed)
With chronic lower leg edema Continues Lasix 40 mg daily

## 2023-07-12 NOTE — Assessment & Plan Note (Signed)
Stable.  On CPAP treatment.

## 2023-07-12 NOTE — Assessment & Plan Note (Signed)
Contributing to chronic low back pain and compromised mobility

## 2023-07-12 NOTE — Assessment & Plan Note (Signed)
With compromised function of lower legs.

## 2023-07-12 NOTE — Assessment & Plan Note (Signed)
Advised to stay well-hydrated and avoid NSAIDs. Continue Jardiance 25 mg daily

## 2023-07-12 NOTE — Assessment & Plan Note (Signed)
Well-controlled hypertension Continue amlodipine 10 mg and Benicar HCT 40-25 mg daily Hemoglobin A1c better than before 6.6. Follows up with endocrinologist on a regular basis who is handling his diabetes medications Presently on Tresiba 100 units twice a day, metformin 1000 mg twice a day, Jardiance 25 mg daily Was also recently started on Ozempic 0.5 mg weekly

## 2023-07-12 NOTE — Assessment & Plan Note (Signed)
Diet and nutrition discussed.  Advised to decrease amount of daily carbohydrate intake and daily calories and increase amount of plant based protein in his diet

## 2023-07-12 NOTE — Assessment & Plan Note (Signed)
Also followed up by endocrinologist Continue Synthroid 274 mcg daily

## 2023-07-12 NOTE — Progress Notes (Signed)
Johnny Andrews 62 y.o.   Chief Complaint  Patient presents with   Medical Management of Chronic Issues    6 month f/u for HTN / DM. Patient c/o of hips hurting when he is sleeping.     HISTORY OF PRESENT ILLNESS: This is a 62 y.o. male here for 7-month follow-up of chronic medical conditions. No changes.  Occasional hip pain. No other complaints or medical concerns today. Lab Results  Component Value Date   HGBA1C 7.5 (A) 01/11/2023   Wt Readings from Last 3 Encounters:  03/22/23 (!) 440 lb 6 oz (199.8 kg)  01/11/23 (!) 442 lb (200.5 kg)  10/19/22 (!) 435 lb (197.3 kg)   BP Readings from Last 3 Encounters:  03/22/23 126/76  01/11/23 124/78  10/19/22 132/76     HPI   Prior to Admission medications   Medication Sig Start Date End Date Taking? Authorizing Provider  albuterol (VENTOLIN HFA) 108 (90 Base) MCG/ACT inhaler Inhale 2 puffs into the lungs every 4 (four) hours as needed for wheezing or shortness of breath. 08/26/22  Yes Viyan Rosamond, Eilleen Kempf, MD  ALPRAZolam Prudy Feeler) 0.5 MG tablet TAKE 1 TABLET BY MOUTH TWICE A DAY AS NEEDED FOR ANXIETY 06/06/23  Yes Eilah Common, Eilleen Kempf, MD  amLODipine (NORVASC) 10 MG tablet Take 10 mg by mouth daily. 11/01/22  Yes [provider]  blood glucose meter kit and supplies 1 each by Other route 2 (two) times daily. 03/14/20  Yes Georgina Quint, MD  diclofenac (VOLTAREN) 75 MG EC tablet TAKE 1 TABLET BY MOUTH TWICE A DAY 05/26/23  Yes Ezrael Sam, Eilleen Kempf, MD  empagliflozin (JARDIANCE) 25 MG TABS tablet Take 25 mg by mouth daily.   Yes [provider]  fenofibrate micronized (LOFIBRA) 134 MG capsule Take 134 mg by mouth daily. 01/07/23  Yes [provider]  Fluticasone-Umeclidin-Vilant (TRELEGY ELLIPTA) 100-62.5-25 MCG/ACT AEPB Inhale 1 puff into the lungs daily. 01/11/23  Yes Jerett Odonohue, Eilleen Kempf, MD  furosemide (LASIX) 40 MG tablet TAKE 1 TABLET BY MOUTH EVERY DAY 11/21/22  Yes Ashar Lewinski, [place], MD   glucose blood (ONE TOUCH ULTRA TEST) test strip Two times a day for ULTRA 2 ONE TOUCH 02/28/20  Yes Khup Sapia, Eilleen Kempf, MD  insulin aspart (NOVOLOG) 100 UNIT/ML FlexPen Inject 75 Units into the skin 2 (two) times daily before a meal. 30 units am and 50 units pm   Yes [provider]  Insulin Pen Needle (BD ULTRA-FINE PEN NEEDLES) 29G X 12.7MM MISC USE TWO CHECK BLOOD SUGAR TWICE A DAY. APPOINTMENT NEEDED FOR FURTHER REFILLS 02/28/20  Yes Georgina Quint, MD  levothyroxine (SYNTHROID) 137 MCG tablet Take 2 tablets (274 mcg total) by mouth daily before breakfast. 08/26/22 08/21/23 Yes Sriman Tally, Eilleen Kempf, MD  metFORMIN (GLUCOPHAGE-XR) 500 MG 24 hr tablet Take 1,000 mg by mouth 2 (two) times daily.  09/15/15  Yes [provider]  olmesartan-hydrochlorothiazide (BENICAR HCT) 40-25 MG tablet Take 1 tablet by mouth daily. 01/07/23  Yes [provider]  Respiratory Therapy Supplies (ADULT MASK) MISC 2 Devices by Does not apply route once. 11/24/11  Yes Oretha Milch, MD  simvastatin (ZOCOR) 20 MG tablet Take 20 mg by mouth daily.   Yes [provider]  topiramate (TOPAMAX) 50 MG tablet Take 2 tablets (100 mg total) by mouth at bedtime. 03/22/23  Yes Georgina Quint, MD  traZODone (DESYREL) 100 MG tablet TAKE 1 TABLET BY MOUTH EVERYDAY AT BEDTIME 05/29/23  Yes Danaye Sobh, Eilleen Kempf, MD  TRESIBA FLEXTOUCH 200 UNIT/ML SOPN INJECT 100 UNITS TWICE A DAY 12/17/16  Yes [provider]  azithromycin (ZITHROMAX) 250 MG tablet Sig as indicated Patient not taking: Reported on 07/12/2023 10/19/22   Georgina Quint, MD  gabapentin (NEURONTIN) 300 MG capsule Take 300 mg by mouth daily. Patient not taking: Reported on 10/19/2022 08/20/20   [provider]  meclizine (ANTIVERT) 50 MG tablet Take 1 tablet (50 mg total) by mouth 3 (three) times daily as needed. Patient not taking: Reported on 07/12/2023 06/09/20   Georgina Quint, MD    Allergies   Allergen Reactions   Other     Dust mites    Patient Active Problem List   Diagnosis Date Noted   Stage 3a chronic kidney disease (HCC) 01/11/2023   Plantar fasciitis, bilateral 06/01/2021   Heel pain, bilateral 05/30/2021   Claudication (HCC) 07/08/2020   Chronic venous insufficiency 07/25/2019   Encounter for orthopedic follow-up care 08/14/2018   Carpal tunnel syndrome of right wrist 04/24/2018   Diabetic peripheral neuropathy (HCC) 04/24/2018   Morbid obesity (HCC) 01/27/2018   Osteoarthritis of left knee 12/16/2017   Osteoarthritis of right knee 12/16/2017   Family history of prostate cancer 03/06/2017   Pure hypercholesterolemia 09/13/2016   Lumbar degenerative disc disease 07/22/2016   Spinal stenosis of lumbar region 11/03/2015   Abnormality of gait 09/15/2015   Restrictive lung disease 05/05/2015   Impotence of organic origin 02/05/2013   Hypogonadism male 02/05/2013   Elevated PSA 11/30/2011   GOUT 04/17/2010   SMOKER 04/17/2010   Sleep apnea 08/22/2009   Hypothyroidism 09/19/2007   Dyslipidemia associated with type 2 diabetes mellitus (HCC) 09/19/2007   Situational anxiety 09/19/2007   Hypertension associated with diabetes (HCC) 09/19/2007   Osteoarthritis 09/19/2007    Past Medical History:  Diagnosis Date   ALCOHOL USE 10/24/2008   ANXIETY 09/19/2007   Cough 05/22/2008   Diabetes mellitus without complication (HCC)    Phreesia 06/02/2020   DIABETES MELLITUS, TYPE II 09/19/2007   External thrombosed hemorrhoids 08/23/2008   GOUT 04/17/2010   HYPERLIPIDEMIA 09/19/2007   HYPERTENSION 09/19/2007   Hypertension    Phreesia 06/02/2020   HYPOTHYROIDISM 09/19/2007   Leg pain    OSTEOARTHRITIS 09/19/2007   OTHER DISEASES OF LUNG NOT ELSEWHERE CLASSIFIED 07/20/2010   Rash and other nonspecific skin eruption 09/19/2007   Restrictive lung disease    SCROTAL ABSCESS 05/27/2010   Sebaceous cyst 08/01/2008   SLEEP APNEA 08/22/2009   Sleep apnea    Phreesia 06/02/2020   SMOKER  04/17/2010   Thyroid disease    Phreesia 06/02/2020    Past Surgical History:  Procedure Laterality Date   COLONOSCOPY     NASAL SEPTUM SURGERY      Social History   Socioeconomic History   Marital status: Married    Spouse name: Not on file   Number of children: 2   Years of education: BS   Highest education level: Not on file  Occupational History   Occupation: Software engineer: [employer]  Tobacco Use   Smoking status: Former    Current packs/day: 0.00    Average packs/day: 0.5 packs/day for 30.0 years (15.0 ttl pk-yrs)    Types: Cigarettes    Start date: 67    Quit date: 2008    Years since quitting: 16.9   Smokeless tobacco: Never   Tobacco comments:    30 years at most off and on smoking 03/14/15  Substance and Sexual Activity  Alcohol use: Yes    Alcohol/week: 0.0 standard drinks of alcohol    Comment: 2 drinks per day   Drug use: Yes    Comment: Occasional Marijuana use   Sexual activity: Not on file  Other Topics Concern   Not on file  Social History Narrative   Originally from [place]. Has lived in [place], [place], [place], & [place] since 1993. 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 Currently has a dog & cats. Previously had a parrot (conure) 12 years ago for 1 year but in same house.  Has also had excessive exposure to pigeon feces. He has had very brief exposure to asbestos around 2000. No hot tub exposure. He has inhaled chemical fumes/gases/refridgerants through his work.      Marital status: married x 26 years      Children: 2 children (19, 90); no grandchildren      Lives: Lives at home with his wife      Employment: [employer]      Tobacco: none; quit in 2013; smoked x 1/2 ppd x 20 years      Alcohol:  10 drinks; usually 2 drinks per day and then skip two days.      Drugs:  None      Exercise: none; job is physically demanding      Right-handed.   1 cup caffeine daily.   Social  Determinants of Health   Financial Resource Strain: Low Risk  (06/15/2022)   Overall Financial Resource Strain (CARDIA)    Difficulty of Paying Living Expenses: Not hard at all  Food Insecurity: No Food Insecurity (06/15/2022)   Hunger Vital Sign    Worried About Running Out of Food in the Last Year: Never true    Ran Out of Food in the Last Year: Never true  Transportation Needs: No Transportation Needs (06/15/2022)   PRAPARE - Administrator, Civil Service (Medical): No    Lack of Transportation (Non-Medical): No  Physical Activity: Inactive (06/15/2022)   Exercise Vital Sign    Days of Exercise per Week: 0 days    Minutes of Exercise per Session: 0 min  Stress: No Stress Concern Present (06/15/2022)   [employer] of Occupational Health - Occupational Stress Questionnaire    Feeling of Stress : Not at all  Social Connections: Unknown (06/15/2022)   Social Connection and Isolation Panel [NHANES]    Frequency of Communication with Friends and Family: Patient declined    Frequency of Social Gatherings with Friends and Family: Patient declined    Attends Religious Services: Patient declined    Database administrator or Organizations: Patient declined    Attends Banker Meetings: Patient declined    Marital Status: Married  Catering manager Violence: Not At Risk (06/15/2022)   Humiliation, Afraid, Rape, and Kick questionnaire    Fear of Current or Ex-Partner: No    Emotionally Abused: No    Physically Abused: No    Sexually Abused: No    Family History  Problem Relation Age of Onset   Breast cancer Mother        Breast Cancer   Parkinson's disease Mother    Heart disease Father        CABG, stenting cardiac   Cancer Brother 62       prostate cancer   Lung disease Neg Hx    Autoimmune disease Neg Hx  Review of Systems  Constitutional: Negative.  Negative for chills and fever.  HENT: Negative.  Negative for congestion and sore throat.    Respiratory: Negative.  Negative for cough and shortness of breath.   Cardiovascular: Negative.  Negative for chest pain and palpitations.  Gastrointestinal:  Negative for abdominal pain, diarrhea, nausea and vomiting.  Genitourinary: Negative.  Negative for dysuria and hematuria.  Musculoskeletal:  Positive for back pain and joint pain.  Skin: Negative.  Negative for rash.  Neurological: Negative.  Negative for dizziness and headaches.  All other systems reviewed and are negative.   Today's Vitals   07/12/23 1001  BP: 128/80  Pulse: 87  Temp: 98.5 F (36.9 C)  TempSrc: Oral  SpO2: 96%  Weight: (!) 444 lb (201.4 kg)  Height: 6\' 5"  (1.956 m)   Body mass index is 52.65 kg/m.   Physical Exam Vitals reviewed.  Constitutional:      Appearance: Normal appearance. He is obese.  HENT:     Head: Normocephalic.  Eyes:     Extraocular Movements: Extraocular movements intact.  Cardiovascular:     Rate and Rhythm: Normal rate.  Pulmonary:     Effort: Pulmonary effort is normal.  Skin:    General: Skin is warm and dry.     Capillary Refill: Capillary refill takes less than 2 seconds.  Neurological:     Mental Status: He is alert and oriented to person, place, and time.  Psychiatric:        Behavior: Behavior normal.      ASSESSMENT & PLAN: A total of 45 minutes was spent with the patient and counseling/coordination of care regarding preparing for this visit, review of most recent office visit notes, review of multiple chronic medical conditions and their management, review of all medications, review of most recent bloodwork results, review of health maintenance items, education on nutrition, prognosis, documentation, and need for follow up.   Problem List Items Addressed This Visit       Cardiovascular and Mediastinum   Hypertension associated with diabetes (HCC) - Primary    Well-controlled hypertension Continue amlodipine 10 mg and Benicar HCT 40-25 mg  daily Hemoglobin A1c better than before 6.6. Follows up with endocrinologist on a regular basis who is handling his diabetes medications Presently on Tresiba 100 units twice a day, metformin 1000 mg twice a day, Jardiance 25 mg daily Was also recently started on Ozempic 0.5 mg weekly        Chronic venous insufficiency    With chronic lower leg edema Continues Lasix 40 mg daily        Respiratory   Sleep apnea    Stable.  On CPAP treatment.      Restrictive lung disease    Continues daily Trelegy and albuterol for rescue inhaler as needed Stable and well-controlled condition        Endocrine   Hypothyroidism    Also followed up by endocrinologist Continue Synthroid 274 mcg daily      Dyslipidemia associated with type 2 diabetes mellitus (HCC)    Chronic stable condition Continues simvastatin 20 mg daily      Relevant Orders   POCT HgB A1C (Completed)   Diabetic peripheral neuropathy (HCC)    With compromised function of lower legs.          Musculoskeletal and Integument   Lumbar degenerative disc disease    With resultant chronic low back pain and compromised mobility  Genitourinary   Stage 3a chronic kidney disease (HCC)    Advised to stay well-hydrated and avoid NSAIDs Continue Jardiance 25 mg daily        Other   Spinal stenosis of lumbar region    Contributing to chronic low back pain and compromised mobility       Morbid obesity (HCC)    Diet and nutrition discussed Advised to decrease amount of daily carbohydrate intake and daily calories and increase amount of plant-based protein in his diet      Patient Instructions  Health Maintenance, Male Adopting a healthy lifestyle and getting preventive care are important in promoting health and wellness. Ask your health care provider about: The right schedule for you to have regular tests and exams. Things you can do on your own to prevent diseases and keep yourself healthy. What should I  know about diet, weight, and exercise? Eat a healthy diet  Eat a diet that includes plenty of vegetables, fruits, low-fat dairy products, and lean protein. Do not eat a lot of foods that are high in solid fats, added sugars, or sodium. Maintain a healthy weight Body mass index (BMI) is a measurement that can be used to identify possible weight problems. It estimates body fat based on height and weight. Your health care provider can help determine your BMI and help you achieve or maintain a healthy weight. Get regular exercise Get regular exercise. This is one of the most important things you can do for your health. Most adults should: Exercise for at least 150 minutes each week. The exercise should increase your heart rate and make you sweat (moderate-intensity exercise). Do strengthening exercises at least twice a week. This is in addition to the moderate-intensity exercise. Spend less time sitting. Even light physical activity can be beneficial. Watch cholesterol and blood lipids Have your blood tested for lipids and cholesterol at 62 years of age, then have this test every 5 years. You may need to have your cholesterol levels checked more often if: Your lipid or cholesterol levels are high. You are older than 62 years of age. You are at high risk for heart disease. What should I know about cancer screening? Many types of cancers can be detected early and may often be prevented. Depending on your health history and family history, you may need to have cancer screening at various ages. This may include screening for: Colorectal cancer. Prostate cancer. Skin cancer. Lung cancer. What should I know about heart disease, diabetes, and high blood pressure? Blood pressure and heart disease High blood pressure causes heart disease and increases the risk of stroke. This is more likely to develop in people who have high blood pressure readings or are overweight. Talk with your health care provider  about your target blood pressure readings. Have your blood pressure checked: Every 3-5 years if you are 86-27 years of age. Every year if you are 61 years old or older. If you are between the ages of 24 and 54 and are a current or former smoker, ask your health care provider if you should have a one-time screening for abdominal aortic aneurysm (AAA). Diabetes Have regular diabetes screenings. This checks your fasting blood sugar level. Have the screening done: Once every three years after age 100 if you are at a normal weight and have a low risk for diabetes. More often and at a younger age if you are overweight or have a high risk for diabetes. What should I know about preventing infection?  Hepatitis B If you have a higher risk for hepatitis B, you should be screened for this virus. Talk with your health care provider to find out if you are at risk for hepatitis B infection. Hepatitis C Blood testing is recommended for: Everyone born from [birth year] through 1965. Anyone with known risk factors for hepatitis C. Sexually transmitted infections (STIs) You should be screened each year for STIs, including gonorrhea and chlamydia, if: You are sexually active and are younger than 62 years of age. You are older than 62 years of age and your health care provider tells you that you are at risk for this type of infection. Your sexual activity has changed since you were last screened, and you are at increased risk for chlamydia or gonorrhea. Ask your health care provider if you are at risk. Ask your health care provider about whether you are at high risk for HIV. Your health care provider may recommend a prescription medicine to help prevent HIV infection. If you choose to take medicine to prevent HIV, you should first get tested for HIV. You should then be tested every 3 months for as long as you are taking the medicine. Follow these instructions at home: Alcohol use Do not drink alcohol if your health care  provider tells you not to drink. If you drink alcohol: Limit how much you have to 0-2 drinks a day. Know how much alcohol is in your drink. In the [place] one drink equals one 12 oz bottle of beer (355 mL), one 5 oz glass of wine (148 mL), or one 1 oz glass of hard liquor (44 mL). Lifestyle Do not use any products that contain nicotine or tobacco. These products include cigarettes, chewing tobacco, and vaping devices, such as e-cigarettes. If you need help quitting, ask your health care provider. Do not use street drugs. Do not share needles. Ask your health care provider for help if you need support or information about quitting drugs. General instructions Schedule regular health, dental, and eye exams. Stay current with your vaccines. Tell your health care provider if: You often feel depressed. You have ever been abused or do not feel safe at home. Summary Adopting a healthy lifestyle and getting preventive care are important in promoting health and wellness. Follow your health care provider's instructions about healthy diet, exercising, and getting tested or screened for diseases. Follow your health care provider's instructions on monitoring your cholesterol and blood pressure. This information is not intended to replace advice given to you by your health care provider. Make sure you discuss any questions you have with your health care provider. Document Revised: 12/22/2020 Document Reviewed: 12/22/2020 Elsevier Patient Education  2024 Elsevier Inc.      Edwina Barth, MD [facility]

## 2023-07-12 NOTE — Patient Instructions (Signed)
Health Maintenance, Male Adopting a healthy lifestyle and getting preventive care are important in promoting health and wellness. Ask your health care provider about: The right schedule for you to have regular tests and exams. Things you can do on your own to prevent diseases and keep yourself healthy. What should I know about diet, weight, and exercise? Eat a healthy diet  Eat a diet that includes plenty of vegetables, fruits, low-fat dairy products, and lean protein. Do not eat a lot of foods that are high in solid fats, added sugars, or sodium. Maintain a healthy weight Body mass index (BMI) is a measurement that can be used to identify possible weight problems. It estimates body fat based on height and weight. Your health care provider can help determine your BMI and help you achieve or maintain a healthy weight. Get regular exercise Get regular exercise. This is one of the most important things you can do for your health. Most adults should: Exercise for at least 150 minutes each week. The exercise should increase your heart rate and make you sweat (moderate-intensity exercise). Do strengthening exercises at least twice a week. This is in addition to the moderate-intensity exercise. Spend less time sitting. Even light physical activity can be beneficial. Watch cholesterol and blood lipids Have your blood tested for lipids and cholesterol at 62 years of age, then have this test every 5 years. You may need to have your cholesterol levels checked more often if: Your lipid or cholesterol levels are high. You are older than 62 years of age. You are at high risk for heart disease. What should I know about cancer screening? Many types of cancers can be detected early and may often be prevented. Depending on your health history and family history, you may need to have cancer screening at various ages. This may include screening for: Colorectal cancer. Prostate cancer. Skin cancer. Lung  cancer. What should I know about heart disease, diabetes, and high blood pressure? Blood pressure and heart disease High blood pressure causes heart disease and increases the risk of stroke. This is more likely to develop in people who have high blood pressure readings or are overweight. Talk with your health care provider about your target blood pressure readings. Have your blood pressure checked: Every 3-5 years if you are 18-39 years of age. Every year if you are 40 years old or older. If you are between the ages of 65 and 75 and are a current or former smoker, ask your health care provider if you should have a one-time screening for abdominal aortic aneurysm (AAA). Diabetes Have regular diabetes screenings. This checks your fasting blood sugar level. Have the screening done: Once every three years after age 45 if you are at a normal weight and have a low risk for diabetes. More often and at a younger age if you are overweight or have a high risk for diabetes. What should I know about preventing infection? Hepatitis B If you have a higher risk for hepatitis B, you should be screened for this virus. Talk with your health care provider to find out if you are at risk for hepatitis B infection. Hepatitis C Blood testing is recommended for: Everyone born from 1945 through 1965. Anyone with known risk factors for hepatitis C. Sexually transmitted infections (STIs) You should be screened each year for STIs, including gonorrhea and chlamydia, if: You are sexually active and are younger than 62 years of age. You are older than 62 years of age and your   health care provider tells you that you are at risk for this type of infection. Your sexual activity has changed since you were last screened, and you are at increased risk for chlamydia or gonorrhea. Ask your health care provider if you are at risk. Ask your health care provider about whether you are at high risk for HIV. Your health care provider  may recommend a prescription medicine to help prevent HIV infection. If you choose to take medicine to prevent HIV, you should first get tested for HIV. You should then be tested every 3 months for as long as you are taking the medicine. Follow these instructions at home: Alcohol use Do not drink alcohol if your health care provider tells you not to drink. If you drink alcohol: Limit how much you have to 0-2 drinks a day. Know how much alcohol is in your drink. In the [place] one drink equals one 12 oz bottle of beer (355 mL), one 5 oz glass of wine (148 mL), or one 1 oz glass of hard liquor (44 mL). Lifestyle Do not use any products that contain nicotine or tobacco. These products include cigarettes, chewing tobacco, and vaping devices, such as e-cigarettes. If you need help quitting, ask your health care provider. Do not use street drugs. Do not share needles. Ask your health care provider for help if you need support or information about quitting drugs. General instructions Schedule regular health, dental, and eye exams. Stay current with your vaccines. Tell your health care provider if: You often feel depressed. You have ever been abused or do not feel safe at home. Summary Adopting a healthy lifestyle and getting preventive care are important in promoting health and wellness. Follow your health care provider's instructions about healthy diet, exercising, and getting tested or screened for diseases. Follow your health care provider's instructions on monitoring your cholesterol and blood pressure. This information is not intended to replace advice given to you by your health care provider. Make sure you discuss any questions you have with your health care provider. Document Revised: 12/22/2020 Document Reviewed: 12/22/2020 [employer] Patient Education  2024 [employer]  

## 2023-07-20 ENCOUNTER — Encounter: Payer: Self-pay | Admitting: Emergency Medicine

## 2023-07-20 ENCOUNTER — Other Ambulatory Visit: Payer: Self-pay | Admitting: Emergency Medicine

## 2023-07-20 DIAGNOSIS — G6289 Other specified polyneuropathies: Secondary | ICD-10-CM

## 2023-07-20 NOTE — Telephone Encounter (Signed)
 Care team updated and letter sent for eye exam notes.

## 2023-09-15 ENCOUNTER — Other Ambulatory Visit: Payer: Self-pay | Admitting: Emergency Medicine

## 2023-09-15 DIAGNOSIS — E034 Atrophy of thyroid (acquired): Secondary | ICD-10-CM

## 2023-09-19 DIAGNOSIS — E781 Pure hyperglyceridemia: Secondary | ICD-10-CM | POA: Diagnosis not present

## 2023-09-19 DIAGNOSIS — E1165 Type 2 diabetes mellitus with hyperglycemia: Secondary | ICD-10-CM | POA: Diagnosis not present

## 2023-09-19 DIAGNOSIS — E039 Hypothyroidism, unspecified: Secondary | ICD-10-CM | POA: Diagnosis not present

## 2023-09-26 DIAGNOSIS — E781 Pure hyperglyceridemia: Secondary | ICD-10-CM | POA: Diagnosis not present

## 2023-09-26 DIAGNOSIS — E7800 Pure hypercholesterolemia, unspecified: Secondary | ICD-10-CM | POA: Diagnosis not present

## 2023-09-26 DIAGNOSIS — E1165 Type 2 diabetes mellitus with hyperglycemia: Secondary | ICD-10-CM | POA: Diagnosis not present

## 2023-09-26 DIAGNOSIS — N189 Chronic kidney disease, unspecified: Secondary | ICD-10-CM | POA: Diagnosis not present

## 2023-09-26 DIAGNOSIS — I10 Essential (primary) hypertension: Secondary | ICD-10-CM | POA: Diagnosis not present

## 2023-09-26 DIAGNOSIS — E039 Hypothyroidism, unspecified: Secondary | ICD-10-CM | POA: Diagnosis not present

## 2023-09-26 DIAGNOSIS — E1140 Type 2 diabetes mellitus with diabetic neuropathy, unspecified: Secondary | ICD-10-CM | POA: Diagnosis not present

## 2023-10-12 ENCOUNTER — Other Ambulatory Visit: Payer: Self-pay | Admitting: Emergency Medicine

## 2023-10-12 DIAGNOSIS — F418 Other specified anxiety disorders: Secondary | ICD-10-CM

## 2023-10-17 ENCOUNTER — Other Ambulatory Visit: Payer: Self-pay | Admitting: Emergency Medicine

## 2023-10-19 ENCOUNTER — Encounter: Payer: Self-pay | Admitting: Emergency Medicine

## 2023-10-19 ENCOUNTER — Telehealth (INDEPENDENT_AMBULATORY_CARE_PROVIDER_SITE_OTHER): Admitting: Emergency Medicine

## 2023-10-19 DIAGNOSIS — R0989 Other specified symptoms and signs involving the circulatory and respiratory systems: Secondary | ICD-10-CM | POA: Diagnosis not present

## 2023-10-19 DIAGNOSIS — R051 Acute cough: Secondary | ICD-10-CM | POA: Diagnosis not present

## 2023-10-19 DIAGNOSIS — J22 Unspecified acute lower respiratory infection: Secondary | ICD-10-CM | POA: Diagnosis not present

## 2023-10-19 MED ORDER — AZITHROMYCIN 250 MG PO TABS: ORAL_TABLET | ORAL | 0 refills | Status: DC

## 2023-10-19 MED ORDER — BENZONATATE 200 MG PO CAPS: 200.0000 mg | ORAL_CAPSULE | Freq: Two times a day (BID) | ORAL | 0 refills | Status: DC | PRN

## 2023-10-19 MED ORDER — AIRSUPRA 90-80 MCG/ACT IN AERO: 2.0000 | INHALATION_SPRAY | Freq: Two times a day (BID) | RESPIRATORY_TRACT | 1 refills | Status: DC

## 2023-10-19 NOTE — Assessment & Plan Note (Signed)
 Cough management discussed. Continue over-the-counter Mucinex dm and cough drops Tessalon 200 mg 3 times a day Advised to stay well-hydrated and rest

## 2023-10-19 NOTE — Assessment & Plan Note (Signed)
 Recommend over-the-counter Mucinex DM Airsupra as prescribed Advised to rest and stay well-hydrated

## 2023-10-19 NOTE — Assessment & Plan Note (Signed)
Productive cough.  Clinically stable. No red flag signs or symptoms. Recommend daily azithromycin for 5 days. ED precautions given Advised to contact the office if no better or worse during the next several days.

## 2023-10-19 NOTE — Progress Notes (Signed)
 Telemedicine Encounter- SOAP NOTE Established Patient MyChart video encounter Patient: Home  Provider: Office   Patient present only  This video encounter was conducted with the patient's (or proxy's) verbal consent via video telecommunications: yes/no: Yes Patient was instructed to have this encounter in a suitably private space; and to only have persons present to whom they give permission to participate. In addition, patient identity was confirmed by use of name plus two identifiers (DOB and address).  Chief complaint: Flulike symptoms Subjective  Johnny Andrews is a 63 y.o. established patient.  Visit today complaining of flulike symptoms that started 4 to 5 days ago with chest congestion, cough and occasional wheezing.  No other associated symptoms.  No other complaints or medical concerns today. HPI ? Patient Active Problem List   Diagnosis Date Noted   Stage 3a chronic kidney disease (HCC) 01/11/2023   Plantar fasciitis, bilateral 06/01/2021   Heel pain, bilateral 05/30/2021   Claudication (HCC) 07/08/2020   Chronic venous insufficiency 07/25/2019   Encounter for orthopedic follow-up care 08/14/2018   Carpal tunnel syndrome of right wrist 04/24/2018   Diabetic peripheral neuropathy (HCC) 04/24/2018   Morbid obesity (HCC) 01/27/2018   Osteoarthritis of left knee 12/16/2017   Osteoarthritis of right knee 12/16/2017   Family history of prostate cancer 03/06/2017   Pure hypercholesterolemia 09/13/2016   Lumbar degenerative disc disease 07/22/2016   Spinal stenosis of lumbar region 11/03/2015   Abnormality of gait 09/15/2015   Restrictive lung disease 05/05/2015   Impotence of organic origin 02/05/2013   Hypogonadism male 02/05/2013   Elevated PSA 11/30/2011   GOUT 04/17/2010   SMOKER 04/17/2010   Sleep apnea 08/22/2009   Hypothyroidism 09/19/2007   Dyslipidemia associated with type 2 diabetes mellitus (HCC) 09/19/2007   Situational anxiety 09/19/2007   Hypertension  associated with diabetes (HCC) 09/19/2007   Osteoarthritis 09/19/2007   Past Medical History:  Diagnosis Date   ALCOHOL USE 10/24/2008   ANXIETY 09/19/2007   Cough 05/22/2008   Diabetes mellitus without complication (HCC)    [employer]reesia 06/02/2020   DIABETES MELLITUS, TYPE II 09/19/2007   External thrombosed hemorrhoids 08/23/2008   GOUT 04/17/2010   HYPERLIPIDEMIA 09/19/2007   HYPERTENSION 09/19/2007   Hypertension    [employer]reesia 06/02/2020   HYPOTHYROIDISM 09/19/2007   Leg pain    OSTEOARTHRITIS 09/19/2007   OTHER DISEASES OF LUNG NOT ELSEWHERE CLASSIFIED 07/20/2010   Rash and other nonspecific skin eruption 09/19/2007   Restrictive lung disease    SCROTAL ABSCESS 05/27/2010   Sebaceous cyst 08/01/2008   SLEEP APNEA 08/22/2009   Sleep apnea    [employer]rees[employer] 06/02/2020   SMOKER 04/17/2010   Thyroid disease    [employer] 06/02/2020   Current Outpatient Medications  Medication Sig Dispense Refill   Albuterol-Budesonide (AIRSUPRA) 90-80 MCG/ACT AERO Inhale 2 puffs into the lungs 2 (two) times daily. 10.7 g 1   azithromycin (ZITHROMAX) 250 MG tablet Sig as indicated 6 tablet 0   benzonatate (TESSALON) 200 MG capsule Take 1 capsule (200 mg total) by mouth 2 (two) times daily as needed for cough. 20 capsule 0   albuterol (VENTOLIN HFA) 108 (90 Base) MCG/ACT inhaler Inhale 2 puffs into the lungs every 4 (four) hours as needed for wheezing or shortness of breath. 6.7 each 1   ALPRAZolam (XANAX) 0.5 MG tablet TAKE 1 TABLET BY MOUTH TWICE A DAY AS NEEDED FOR ANXIETY 30 tablet 1   amLODipine (NORVASC) 10 MG tablet Take 10 mg by mouth daily.     blood glucose  meter kit and supplies 1 each by Other route 2 (two) times daily. 1 each 1   diclofenac (VOLTAREN) 75 MG EC tablet TAKE 1 TABLET BY MOUTH TWICE A DAY 180 tablet 1   empagliflozin (JARDIANCE) 25 MG TABS tablet Take 25 mg by mouth daily.     fenofibrate micronized (LOFIBRA) 134 MG capsule Take 134 mg by mouth daily.     Fluticasone-Umeclidin-Vilant (TRELEGY ELLIPTA)  100-62.5-25 MCG/ACT AEPB Inhale 1 puff into the lungs daily. 1 each 11   furosemide (LASIX) 40 MG tablet TAKE 1 TABLET BY MOUTH EVERY DAY 90 tablet 1   gabapentin (NEURONTIN) 300 MG capsule Take 300 mg by mouth daily. (Patient not taking: Reported on 10/19/2022)     glucose blood (ONE TOUCH ULTRA TEST) test strip Two times a day for ULTRA 2 ONE TOUCH 100 each 1   insulin aspart (NOVOLOG) 100 UNIT/ML FlexPen Inject 75 Units into the skin 2 (two) times daily before a meal. 30 units am and 50 units pm     Insulin Pen Needle (BD ULTRA-FINE PEN NEEDLES) 29G X 12.7MM MISC USE TWO CHECK BLOOD SUGAR TWICE A DAY. APPOINTMENT NEEDED FOR FURTHER REFILLS 100 each 3   levothyroxine (SYNTHROID) 137 MCG tablet TAKE 2 TABLETS (274 MCG TOTAL) BY MOUTH DAILY BEFORE BREAKFAST. 180 tablet 3   meclizine (ANTIVERT) 50 MG tablet Take 1 tablet (50 mg total) by mouth 3 (three) times daily as needed. (Patient not taking: Reported on 07/12/2023) 30 tablet 1   metFORMIN (GLUCOPHAGE-XR) 500 MG 24 hr tablet Take 1,000 mg by mouth 2 (two) times daily.      olmesartan-hydrochlorothiazide (BENICAR HCT) 40-25 MG tablet Take 1 tablet by mouth daily.     Respiratory Therapy Supplies (ADULT MASK) MISC 2 Devices by Does not apply route once. 2 each 0   simvastatin (ZOCOR) 20 MG tablet Take 20 mg by mouth daily.     topiramate (TOPAMAX) 50 MG tablet TAKE 2 TABLETS BY MOUTH AT BEDTIME. 180 tablet 1   traZODone (DESYREL) 100 MG tablet TAKE 1 TABLET BY MOUTH EVERYDAY AT BEDTIME 90 tablet 1   TRESIBA FLEXTOUCH 200 UNIT/ML SOPN INJECT 100 UNITS TWICE A DAY  6   No current facility-administered medications for this visit.   Allergies  Allergen Reactions   Other     Dust mites   Social History   Socioeconomic History   Marital status: Married    Spouse name: Not on file   Number of children: 2   Years of education: BS   Highest education level: Not on file  Occupational History   Occupation: HVAC    Employer: [employer]   Tobacco Use   Smoking status: Former    Current packs/day: 0.00    Average packs/day: 0.5 packs/day for 30.0 years (15.0 ttl pk-yrs)    Types: Cigarettes    Start date: 16    Quit date: 2008    Years since quitting: 17.1   Smokeless tobacco: Never   Tobacco comments:    30 years at most off and on smoking 03/14/15  Substance and Sexual Activity   Alcohol use: Yes    Alcohol/week: 0.0 standard drinks of alcohol    Comment: 2 drinks per day   Drug use: Yes    Comment: Occasional Marijuana use   Sexual activity: Not on file  Other Topics Concern   Not on file  Social History Narrative   Originally from [place]. Has lived in [place], [place], [place], & [place]  since 1993. 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 Currently has a dog & cats. Previously had a parrot (conure) 12 years ago for 1 year but in same house.  Has also had excessive exposure to pigeon feces. He has had very brief exposure to asbestos around 2000. No hot tub exposure. He has inhaled chemical fumes/gases/refridgerants through his work.      Marital status: married x 26 years      Children: 2 children (19, 86); no grandchildren      Lives: Lives at home with his wife      Employment: [employer]      Tobacco: none; quit in 2013; smoked x 1/2 ppd x 20 years      Alcohol:  10 drinks; usually 2 drinks per day and then skip two days.      Drugs:  None      Exercise: none; job is physically demanding      Right-handed.   1 cup caffeine daily.   Social Drivers of Corporate investment banker Strain: Low Risk  (06/15/2022)   Overall Financial Resource Strain (CARDIA)    Difficulty of Paying Living Expenses: Not hard at all  Food Insecurity: No Food Insecurity (06/15/2022)   Hunger Vital Sign    Worried About Running Out of Food in the Last Year: Never true    Ran Out of Food in the Last Year: Never true  Transportation Needs: No Transportation Needs (06/15/2022)    PRAPARE - Administrator, Civil Service (Medical): No    Lack of Transportation (Non-Medical): No  Physical Activity: Inactive (06/15/2022)   Exercise Vital Sign    Days of Exercise per Week: 0 days    Minutes of Exercise per Session: 0 min  Stress: No Stress Concern Present (06/15/2022)   [employer] of Occupational Health - Occupational Stress Questionnaire    Feeling of Stress : Not at all  Social Connections: Unknown (06/15/2022)   Social Connection and Isolation Panel [NHANES]    Frequency of Communication with Friends and Family: Patient declined    Frequency of Social Gatherings with Friends and Family: Patient declined    Attends Religious Services: Patient declined    Database administrator or Organizations: Patient declined    Attends Banker Meetings: Patient declined    Marital Status: Married  Catering manager Violence: Not At Risk (06/15/2022)   Humiliation, Afraid, Rape, and Kick questionnaire    Fear of Current or Ex-Partner: No    Emotionally Abused: No    Physically Abused: No    Sexually Abused: No   Review of Systems  Constitutional: Negative.  Negative for chills and fever.  HENT:  Positive for congestion. Negative for sore throat.   Respiratory:  Positive for cough, sputum production and wheezing. Negative for shortness of breath.   Cardiovascular: Negative.  Negative for chest pain and palpitations.  Gastrointestinal:  Negative for abdominal pain, diarrhea, nausea and vomiting.  Genitourinary: Negative.  Negative for dysuria and hematuria.  Skin: Negative.  Negative for rash.  Neurological: Negative.  Negative for dizziness and headaches.   Objective  Vitals as reported by the patient: There were no vitals filed for this visit. Problem List Items Addressed This Visit       Respiratory   Chest congestion   Recommend over-the-counter Mucinex DM Airsupra as prescribed Advised to rest and stay well-hydrated  Relevant  Medications   Albuterol-Budesonide (AIRSUPRA) 90-80 MCG/ACT AERO   Lower respiratory infection - Primary   Productive cough.  Clinically stable. No red flag signs or symptoms. Recommend daily azithromycin for 5 days. ED precautions given Advised to contact the office if no better or worse during the next several days.      Relevant Medications   azithromycin (ZITHROMAX) 250 MG tablet     Other   Acute cough   Cough management discussed. Continue over-the-counter Mucinex dm and cough drops Tessalon 200 mg 3 times a day Advised to stay well-hydrated and rest      Relevant Medications   benzonatate (TESSALON) 200 MG capsule     I discussed the assessment and treatment plan with the patient. The patient was provided an opportunity to ask questions and all were answered. The patient agreed with the plan and demonstrated an understanding of the instructions.   The patient was advised to call back or seek an in-person evaluation if the symptoms worsen or if the condition fails to improve as anticipated.  I provided 30 minutes minutes of non-face-to-face time during this encounter including time preparing for this visit, review of most recent office visit notes, review of multiple chronic medical conditions under management, review of all medications, diagnosis of lower respiratory infection and need for antibiotics, symptom and cough management, ED precautions, prognosis, documentation and need for follow-up if no better or worse during the next several days.  Dr. Edwina Barth, MD [facility] at [facility], [place]

## 2023-10-20 ENCOUNTER — Other Ambulatory Visit: Payer: Self-pay | Admitting: Emergency Medicine

## 2023-10-20 ENCOUNTER — Telehealth: Payer: Self-pay

## 2023-10-20 DIAGNOSIS — U071 COVID-19: Secondary | ICD-10-CM

## 2023-10-20 MED ORDER — NIRMATRELVIR/RITONAVIR (PAXLOVID)TABLET: 3.0000 | ORAL_TABLET | Freq: Two times a day (BID) | ORAL | 0 refills | Status: AC

## 2023-10-20 NOTE — Telephone Encounter (Signed)
 New prescription for Paxlovid sent to pharmacy of record.  Recommend he also takes Z-Pak.  Thanks.

## 2023-10-20 NOTE — Telephone Encounter (Signed)
 Copied from CRM [id number]. Topic: General - Other >> Oct 20, 2023  9:13 AM [place] A wrote: Reason for CRM: Patient called because he was prescribed Z-Pack yesterday but he took [employer] and he has Covid. Pt wants to know should he take Z-Pack or can he be prescribed Paxlovid-please contact

## 2023-10-21 ENCOUNTER — Encounter: Payer: Self-pay | Admitting: Radiology

## 2023-10-24 ENCOUNTER — Telehealth: Payer: Self-pay | Admitting: Pharmacy Technician

## 2023-10-24 ENCOUNTER — Other Ambulatory Visit (HOSPITAL_COMMUNITY): Payer: Self-pay

## 2023-10-24 ENCOUNTER — Ambulatory Visit: Payer: Self-pay | Admitting: Emergency Medicine

## 2023-10-24 NOTE — Telephone Encounter (Signed)
 Prior Authorization form/request asks a question that requires your assistance. Please see the question below and advise accordingly. The PA will not be submitted until the necessary information is received. We need the ICD-10 Diagnosis for Airsupra in order to submit a PA for the medication to the pt's insurance.

## 2023-10-24 NOTE — Telephone Encounter (Signed)
 Copied from CRM [id number]. Topic: Clinical - Red Word Triage >> Oct 24, 2023 11:25 AM [name] wrote: Red Word that prompted transfer to Nurse Triage: Patient was calling to notify office that his covid is getting better but having problems breathing at night low oxygen 89 and thinks he needs an extra zpack.   Chief Complaint: "can I get another z pack" Symptoms: shortness of breath at night Frequency: intermittent Pertinent Negatives: Patient denies fever or chest pain Disposition: [] ED /[] Urgent Care (no appt availability in office) / [] Appointment(In office/virtual)/ []  [facility]/ [] Home Care/ [] Refused Recommended Disposition /[] [facility]/ [x]  Follow-up with PCP Additional Notes: Patient reports that he was started on z pack on 3/5 and once he found out he had COVID he stopped taking it because he felt like it wasn't going to help the covid. Pts sts that he started experience shortness of breath when laying down 3 nights ago with o2 levels at 89%. Sts he resumed taking the zpack at that time and symptoms got a little better. He completed the z pack last night and still has some Mild shortness of breath. Wants another prescription of the zpack. RN advising an OV, pt refusing at this time. Sts that he has had to take 2 rounds of zpack before. Pt requests RX for zpack be sent to pharmacy on file and that he is notified when its been ordered.  Reason for Disposition  [1] MODERATE longstanding difficulty breathing (e.g., speaks in phrases, SOB even at rest, pulse 100-120) AND [2] SAME as normal  Answer Assessment - Initial Assessment Questions 1. RESPIRATORY STATUS: "Describe your breathing?" (e.g., wheezing, shortness of breath, unable to speak, severe coughing)      Shortness of breath at night  2. ONSET: "When did this breathing problem begin?"      Started 3 days ago  3. PATTERN "Does the difficult breathing come and go, or has it been constant since it started?"       Comes and gom, mainly at night  4. SEVERITY: "How bad is your breathing?" (e.g., mild, moderate, severe)    - MILD: No SOB at rest, mild SOB with walking, speaks normally in sentences, can lie down, no retractions, pulse < 100.    - MODERATE: SOB at rest, SOB with minimal exertion and prefers to sit, cannot lie down flat, speaks in phrases, mild retractions, audible wheezing, pulse 100-120.    - SEVERE: Very SOB at rest, speaks in single words, struggling to breathe, sitting hunched forward, retractions, pulse > 120      Mild- to moderate, mainly at night when laying  5. RECURRENT SYMPTOM: "Have you had difficulty breathing before?" If Yes, ask: "When was the last time?" and "What happened that time?"      Yes, last time this was experienced, pt rec'd 2 z-packs  6. CARDIAC HISTORY: "Do you have any history of heart disease?" (e.g., heart attack, angina, bypass surgery, angioplasty)      HTN  7. LUNG HISTORY: "Do you have any history of lung disease?"  (e.g., pulmonary embolus, asthma, emphysema)     OSA  8. CAUSE: "What do you think is causing the breathing problem?"      Broncjhitis or residual covid  9. OTHER SYMPTOMS: "Do you have any other symptoms? (e.g., dizziness, runny nose, cough, chest pain, fever)     No 10. O2 SATURATION MONITOR:  "Do you use an oxygen saturation monitor (pulse oximeter) at home?" If Yes,  ask: "What is your reading (oxygen level) today?" "What is your usual oxygen saturation reading?" (e.g., 95%)       States that he was 89% 3 days ago  11. PREGNANCY: "Is there any chance you are pregnant?" "When was your last menstrual period?"       N/a  12. TRAVEL: "Have you traveled out of the country in the last month?" (e.g., travel history, exposures)       No  Protocols used: Breathing Difficulty-A-AH

## 2023-10-25 ENCOUNTER — Other Ambulatory Visit (HOSPITAL_COMMUNITY): Payer: Self-pay

## 2023-10-25 NOTE — Telephone Encounter (Signed)
 Pharmacy Patient Advocate Encounter   Received notification from CoverMyMeds that prior authorization for AIRSUPRA is required/requested.   Insurance verification completed.   The patient is insured through [place] .   Per test claim: [id number]  SUBMITTED AND PENDING

## 2023-10-26 ENCOUNTER — Other Ambulatory Visit: Payer: Self-pay | Admitting: Emergency Medicine

## 2023-10-26 ENCOUNTER — Other Ambulatory Visit (HOSPITAL_COMMUNITY): Payer: Self-pay

## 2023-10-26 MED ORDER — AZITHROMYCIN 250 MG PO TABS: ORAL_TABLET | ORAL | 0 refills | Status: DC

## 2023-10-26 NOTE — Telephone Encounter (Signed)
 Pharmacy Patient Advocate Encounter  Received notification from [facility] that Prior Authorization for AIRSUPRA has been APPROVED from 08/17/23 to 08/15/24. Ran test claim, Copay is $100.00. This test claim was processed through [facility]- copay amounts may vary at other pharmacies due to pharmacy/plan contracts, or as the patient moves through the different stages of their insurance plan.   PA #/Case ID/Reference #: [id number]

## 2023-10-26 NOTE — Telephone Encounter (Signed)
 New prescription for Z-Pak sent to pharmacy of record.  However if patient is having a lot of difficulty breathing he should be seen in the emergency department.  Thanks.

## 2023-10-26 NOTE — Telephone Encounter (Signed)
 Spoke with patient and was informed a new rx for Zpak has been sent and been advised if his breathing is worsen to please go to the ED. He understood with no further questions

## 2023-11-19 ENCOUNTER — Other Ambulatory Visit: Payer: Self-pay | Admitting: Emergency Medicine

## 2023-11-19 DIAGNOSIS — G8929 Other chronic pain: Secondary | ICD-10-CM

## 2023-11-23 ENCOUNTER — Ambulatory Visit (INDEPENDENT_AMBULATORY_CARE_PROVIDER_SITE_OTHER)

## 2023-11-23 VITALS — Ht 77.0 in | Wt >= 6400 oz

## 2023-11-23 DIAGNOSIS — Z0000 Encounter for general adult medical examination without abnormal findings: Secondary | ICD-10-CM

## 2023-11-23 NOTE — Progress Notes (Signed)
 Subjective:   Johnny Andrews is a 63 y.o. who presents for a Medicare Wellness preventive visit.  Visit Complete: Virtual I connected with  Johnny Andrews on 11/23/23 by a audio enabled telemedicine application and verified that I am speaking with the correct person using two identifiers.  Patient Location: Home  Provider Location: Home Office  I discussed the limitations of evaluation and management by telemedicine. The patient expressed understanding and agreed to proceed.  Vital Signs: Because this visit was a virtual/telehealth visit, some criteria may be missing or patient reported. Any vitals not documented were not able to be obtained and vitals that have been documented are patient reported.  VideoError- Librarian, academic were attempted between this provider and patient, however failed, due to patient having technical difficulties OR patient did not have access to video capability.  We continued and completed visit with audio only.   Persons Participating in Visit: Patient.  AWV Questionnaire: No: Patient Medicare AWV questionnaire was not completed prior to this visit.  Cardiac Risk Factors include: advanced age (>67men, >49 women)     Objective:    Today's Vitals   11/23/23 1601  Weight: (!) 440 lb (199.6 kg)  Height: 6\' 5"  (1.956 m)   Body mass index is 52.18 kg/m.     11/23/2023    4:08 PM 06/15/2022   11:23 AM  Advanced Directives  Does Patient Have a Medical Advance Directive? Yes Yes  Type of Estate agent of [place];Living will Healthcare Power of [place];Living will  Copy of Healthcare Power of Attorney in Chart? No - copy requested No - copy requested    Current Medications (verified) Outpatient Encounter Medications as of 11/23/2023  Medication Sig   albuterol (VENTOLIN HFA) 108 (90 Base) MCG/ACT inhaler Inhale 2 puffs into the lungs every 4 (four) hours as needed for wheezing or shortness of  breath.   Albuterol-Budesonide (AIRSUPRA) 90-80 MCG/ACT AERO Inhale 2 puffs into the lungs 2 (two) times daily.   ALPRAZolam (XANAX) 0.5 MG tablet TAKE 1 TABLET BY MOUTH TWICE A DAY AS NEEDED FOR ANXIETY   amLODipine (NORVASC) 10 MG tablet Take 10 mg by mouth daily.   azithromycin (ZITHROMAX) 250 MG tablet Sig as indicated   azithromycin (ZITHROMAX) 250 MG tablet Sig as indicated   benzonatate (TESSALON) 200 MG capsule Take 1 capsule (200 mg total) by mouth 2 (two) times daily as needed for cough.   blood glucose meter kit and supplies 1 each by Other route 2 (two) times daily.   diclofenac (VOLTAREN) 75 MG EC tablet TAKE 1 TABLET BY MOUTH TWICE A DAY   empagliflozin (JARDIANCE) 25 MG TABS tablet Take 25 mg by mouth daily.   fenofibrate micronized (LOFIBRA) 134 MG capsule Take 134 mg by mouth daily.   Fluticasone-Umeclidin-Vilant (TRELEGY ELLIPTA) 100-62.5-25 MCG/ACT AEPB Inhale 1 puff into the lungs daily.   furosemide (LASIX) 40 MG tablet TAKE 1 TABLET BY MOUTH EVERY DAY   gabapentin (NEURONTIN) 300 MG capsule Take 300 mg by mouth daily.   glucose blood (ONE TOUCH ULTRA TEST) test strip Two times a day for ULTRA 2 ONE TOUCH   insulin aspart (NOVOLOG) 100 UNIT/ML FlexPen Inject 75 Units into the skin 2 (two) times daily before a meal. 30 units am and 50 units pm   Insulin Pen Needle (BD ULTRA-FINE PEN NEEDLES) 29G X 12.7MM MISC USE TWO CHECK BLOOD SUGAR TWICE A DAY. APPOINTMENT NEEDED FOR FURTHER REFILLS   levothyroxine (SYNTHROID) 137  MCG tablet TAKE 2 TABLETS (274 MCG TOTAL) BY MOUTH DAILY BEFORE BREAKFAST.   meclizine (ANTIVERT) 50 MG tablet Take 1 tablet (50 mg total) by mouth 3 (three) times daily as needed.   metFORMIN (GLUCOPHAGE-XR) 500 MG 24 hr tablet Take 1,000 mg by mouth 2 (two) times daily.    olmesartan-hydrochlorothiazide (BENICAR HCT) 40-25 MG tablet Take 1 tablet by mouth daily.   Respiratory Therapy Supplies (ADULT MASK) MISC 2 Devices by Does not apply route once.    simvastatin (ZOCOR) 20 MG tablet Take 20 mg by mouth daily.   topiramate (TOPAMAX) 50 MG tablet TAKE 2 TABLETS BY MOUTH AT BEDTIME.   traZODone (DESYREL) 100 MG tablet TAKE 1 TABLET BY MOUTH EVERYDAY AT BEDTIME   TRESIBA FLEXTOUCH 200 UNIT/ML SOPN INJECT 100 UNITS TWICE A DAY   No facility-administered encounter medications on file as of 11/23/2023.    Allergies (verified) Other   History: Past Medical History:  Diagnosis Date   ALCOHOL USE 10/24/2008   ANXIETY 09/19/2007   Cough 05/22/2008   Diabetes mellitus without complication (HCC)    Phreesia 06/02/2020   DIABETES MELLITUS, TYPE II 09/19/2007   External thrombosed hemorrhoids 08/23/2008   GOUT 04/17/2010   HYPERLIPIDEMIA 09/19/2007   HYPERTENSION 09/19/2007   Hypertension    Phreesia 06/02/2020   HYPOTHYROIDISM 09/19/2007   Leg pain    OSTEOARTHRITIS 09/19/2007   OTHER DISEASES OF LUNG NOT ELSEWHERE CLASSIFIED 07/20/2010   Rash and other nonspecific skin eruption 09/19/2007   Restrictive lung disease    SCROTAL ABSCESS 05/27/2010   Sebaceous cyst 08/01/2008   SLEEP APNEA 08/22/2009   Sleep apnea    Phreesia 06/02/2020   SMOKER 04/17/2010   Thyroid disease    Phreesia 06/02/2020   Past Surgical History:  Procedure Laterality Date   COLONOSCOPY     NASAL SEPTUM SURGERY     Family History  Problem Relation Age of Onset   Breast cancer Mother        Breast Cancer   Parkinson's disease Mother    Heart disease Father        CABG, stenting cardiac   Cancer Brother 49       prostate cancer   Lung disease Neg Hx    Autoimmune disease Neg Hx    Social History   Socioeconomic History   Marital status: Married    Spouse name: Administrator   Number of children: 2   Years of education: BS   Highest education level: Not on file  Occupational History   Occupation: Disabled/HVAC    Employer: [employer]  Tobacco Use   Smoking status: Former    Current packs/day: 0.00    Average packs/day: 0.5 packs/day for 30.0 years (15.0 ttl pk-yrs)     Types: Cigarettes    Start date: 59    Quit date: 2008    Years since quitting: 17.2   Smokeless tobacco: Never   Tobacco comments:    30 years at most off and on smoking 03/14/15  Vaping Use   Vaping status: Never Used  Substance and Sexual Activity   Alcohol use: Yes    Alcohol/week: 0.0 standard drinks of alcohol    Comment: 2 drinks per day   Drug use: Not Currently    Comment: Occasional Marijuana use   Sexual activity: Not on file  Other Topics Concern   Not on file  Social History Narrative   Originally from [place]. Has lived in [place], [place], [place], & [place] since 1993.  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 Currently has a dog & cats. Previously had a parrot (conure) 12 years ago for 1 year but in same house.  Has also had excessive exposure to pigeon feces. He has had very brief exposure to asbestos around 2000. No hot tub exposure. He has inhaled chemical fumes/gases/refridgerants through his work.      Marital status: married x 26 years      Children: 2 children (19, 49); no grandchildren      Lives: Lives at home with his wife      Employment: [employer]      Tobacco: none; quit in 2013; smoked x 1/2 ppd x 20 years      Alcohol:  10 drinks; usually 2 drinks per day and then skip two days.      Drugs:  None      Exercise: none; job is physically demanding      Right-handed.   1 cup caffeine daily.      Lives with wife and 1 dog/2025   Social Drivers of Health   Financial Resource Strain: Medium Risk (11/23/2023)   Overall Financial Resource Strain (CARDIA)    Difficulty of Paying Living Expenses: Somewhat hard  Food Insecurity: No Food Insecurity (11/23/2023)   Hunger Vital Sign    Worried About Running Out of Food in the Last Year: Never true    Ran Out of Food in the Last Year: Never true  Transportation Needs: No Transportation Needs (11/23/2023)   PRAPARE - Administrator, Civil Service  (Medical): No    Lack of Transportation (Non-Medical): No  Physical Activity: Inactive (11/23/2023)   Exercise Vital Sign    Days of Exercise per Week: 0 days    Minutes of Exercise per Session: 0 min  Stress: Stress Concern Present (11/23/2023)   [employer] of Occupational Health - Occupational Stress Questionnaire    Feeling of Stress : To some extent  Social Connections: Moderately Isolated (11/23/2023)   Social Connection and Isolation Panel [NHANES]    Frequency of Communication with Friends and Family: More than three times a week    Frequency of Social Gatherings with Friends and Family: Once a week    Attends Religious Services: Never    Database administrator or Organizations: No    Attends Banker Meetings: Never    Marital Status: Married    Tobacco Counseling Counseling given: Not Answered Tobacco comments: 30 years at most off and on smoking 03/14/15    Clinical Intake:  Pre-visit preparation completed: Yes  Pain : No/denies pain     BMI - recorded: 52.18 Nutritional Status: BMI > 30  Obese Nutritional Risks: None Diabetes: Yes CBG done?: Yes (130-per pt) Did pt. bring in CBG monitor from home?: No  Lab Results  Component Value Date   HGBA1C 6.6 (A) 07/12/2023   HGBA1C 7.5 (A) 01/11/2023   HGBA1C 6.9 (A) 08/26/2022     How often do you need to have someone help you when you read instructions, pamphlets, or other written materials from your doctor or pharmacy?: 1 - Never  Interpreter Needed?: No  Information entered by :: Hero Kulish, RMA   Activities of Daily Living     11/23/2023    4:04 PM  In your present state of health, do you have any difficulty performing the following activities:  Hearing? 0  Vision? 0  Difficulty concentrating or making decisions? 0  Walking or climbing stairs? 0  Dressing or bathing? 0  Doing errands, shopping? 0  Preparing Food and eating ? N  Using the Toilet? N  In the past six months, have you  accidently leaked urine? N  Do you have problems with loss of bowel control? N  Managing your Medications? N  Managing your Finances? N  Housekeeping or managing your Housekeeping? N    Patient Care Team: Georgina Quint, MD as PCP - General (Internal Medicine) Elvina Sidle, MD as Consulting Physician (Family Medicine) Dorisann Frames, MD as Referring Physician (Endocrinology) [facility] [place] as Consulting Physician (Optometry)  Indicate any recent Medical Services you may have received from other than [facility] providers in the past year (date may be approximate).     Assessment:   This is a routine wellness examination for Johnny Andrews.  Hearing/Vision screen Hearing Screening - Comments:: Denies hearing difficulties   Vision Screening - Comments:: Wears eyeglasses   Goals Addressed   None    Depression Screen     11/23/2023    4:13 PM 07/12/2023   10:17 AM 03/22/2023    3:05 PM 03/22/2023    3:02 PM 01/11/2023   10:34 AM 10/19/2022    1:53 PM 08/26/2022    1:21 PM  PHQ 2/9 Scores  PHQ - 2 Score 1 0 0 0 0 0 0  PHQ- 9 Score 4  0        Fall Risk     11/23/2023    4:08 PM 07/12/2023   10:17 AM 03/22/2023    3:02 PM 01/11/2023   10:34 AM 10/19/2022    1:53 PM  Fall Risk   Falls in the past year? 0 0 0 0 0  Number falls in past yr: 0 0 0 0 0  Injury with Fall? 0 0  0 0  Risk for fall due to : No Fall Risks No Fall Risks No Fall Risks No Fall Risks No Fall Risks  Follow up Falls prevention discussed;Falls evaluation completed Falls evaluation completed Falls evaluation completed Falls evaluation completed Falls evaluation completed    MEDICARE RISK AT HOME:  Medicare Risk at Home Any stairs in or around the home?: No Home free of loose throw rugs in walkways, pet beds, electrical cords, etc?: Yes Adequate lighting in your home to reduce risk of falls?: Yes Life alert?: No Use of a cane, walker or w/c?: Yes Grab bars in the bathroom?: No (Per pt-scared to  shower alone/ due to having to step in tub/shower.  Has a hard time getting in and out) Shower chair or bench in shower?: Yes Elevated toilet seat or a handicapped toilet?: Yes  TIMED UP AND GO:  Was the test performed?  No  Cognitive Function: Declined: Patient declined cognitive screening, but was able to answer questions in an accurate and timely manner. No cognitive impairments observed.        06/15/2022   11:33 AM  6CIT Screen  What Year? 0 points  What month? 0 points  What time? 0 points  Count back from 20 0 points  Months in reverse 0 points  Repeat phrase 0 points  Total Score 0 points    Immunizations Immunization History  Administered Date(s) Administered   Influenza Split 06/23/2012   Influenza, Quadrivalent, Recombinant, Inj, Pf 06/21/2019   Influenza,inj,Quad PF,6+ Mos 05/31/2016, 06/03/2022   Moderna SARS-COV2 Booster Vaccination 08/05/2020   Moderna Sars-Covid-2  Vaccination 11/20/2019, 12/18/2019   Pneumococcal Polysaccharide-23 12/13/2011   Td 10/24/2008    Screening Tests Health Maintenance  Topic Date Due   Zoster Vaccines- Shingrix (1 of 2) Never done   Pneumococcal Vaccine 53-50 Years old (2 of 2 - PCV) 12/12/2012   OPHTHALMOLOGY EXAM  04/07/2023   COVID-19 Vaccine (4 - 2024-25 season) 04/17/2023   FOOT EXAM  06/01/2023   HEMOGLOBIN A1C  01/09/2024   Diabetic kidney evaluation - eGFR measurement  01/11/2024   Diabetic kidney evaluation - Urine ACR  01/11/2024   INFLUENZA VACCINE  03/16/2024   Medicare Annual Wellness (AWV)  11/22/2024   Colonoscopy  02/24/2032   Hepatitis C Screening  Completed   HIV Screening  Completed   HPV VACCINES  Aged Out   DTaP/Tdap/Td  Discontinued    Health Maintenance  Health Maintenance Due  Topic Date Due   Zoster Vaccines- Shingrix (1 of 2) Never done   Pneumococcal Vaccine 16-25 Years old (2 of 2 - PCV) 12/12/2012   OPHTHALMOLOGY EXAM  04/07/2023   COVID-19 Vaccine (4 - 2024-25 season) 04/17/2023    FOOT EXAM  06/01/2023   Health Maintenance Items Addressed: A1C, UACR (Urine Albumin:Creatinine Ratio), See Nurse Notes  Additional Screening:  Vision Screening: Recommended annual ophthalmology exams for early detection of glaucoma and other disorders of the eye.  Dental Screening: Recommended annual dental exams for proper oral hygiene  Community Resource Referral / Chronic Care Management: CRR required this visit?  No   CCM required this visit?  No     Plan:     I have personally reviewed and noted the following in the patient's chart:   Medical and social history Use of alcohol, tobacco or illicit drugs  Current medications and supplements including opioid prescriptions. Patient is not currently taking opioid prescriptions. Functional ability and status Nutritional status Physical activity Advanced directives List of other physicians Hospitalizations, surgeries, and ER visits in previous 12 months Vitals Screenings to include cognitive, depression, and falls Referrals and appointments  In addition, I have reviewed and discussed with patient certain preventive protocols, quality metrics, and best practice recommendations. A written personalized care plan for preventive services as well as general preventive health recommendations were provided to patient.     Daelyn Pettaway L Brihany Butch, CMA   11/23/2023   After Visit Summary: (MyChart) Due to this being a telephonic visit, the after visit summary with patients personalized plan was offered to patient via MyChart   Notes: Please refer to Routing Comments.

## 2023-11-23 NOTE — Patient Instructions (Signed)
 Mr. Johnny Andrews , Thank you for taking time to come for your Medicare Wellness Visit. I appreciate your ongoing commitment to your health goals. Please review the following plan we discussed and let me know if I can assist you in the future.   Referrals/Orders/Follow-Ups/Clinician Recommendations: It was nice talking with today.  You are due for a Tetanus vaccine, a Shingles vaccine, and a Pneumonia vaccine.  You can get these at your local pharmacy.    This is a list of the screening recommended for you and due dates:  Health Maintenance  Topic Date Due   Zoster (Shingles) Vaccine (1 of 2) Never done   Pneumococcal Vaccination (2 of 2 - PCV) 12/12/2012   Eye exam for diabetics  04/07/2023   COVID-19 Vaccine (4 - 2024-25 season) 04/17/2023   Complete foot exam   06/01/2023   Hemoglobin A1C  01/09/2024   Yearly kidney function blood test for diabetes  01/11/2024   Yearly kidney health urinalysis for diabetes  01/11/2024   Flu Shot  03/16/2024   Medicare Annual Wellness Visit  11/22/2024   Colon Cancer Screening  02/24/2032   Hepatitis C Screening  Completed   HIV Screening  Completed   HPV Vaccine  Aged Out   DTaP/Tdap/Td vaccine  Discontinued    Advanced directives: (Copy Requested) Please bring a copy of your health care power of attorney and living will to the office to be added to your chart at your convenience. You can mail to [facility] [address] [place], [place] [address] or email to [email or web address]  Next Medicare Annual Wellness Visit scheduled for next year: Yes

## 2024-03-26 ENCOUNTER — Other Ambulatory Visit: Payer: Self-pay | Admitting: Emergency Medicine

## 2024-03-26 DIAGNOSIS — G6289 Other specified polyneuropathies: Secondary | ICD-10-CM

## 2024-04-09 ENCOUNTER — Other Ambulatory Visit: Payer: Self-pay | Admitting: Emergency Medicine

## 2024-04-09 DIAGNOSIS — F418 Other specified anxiety disorders: Secondary | ICD-10-CM

## 2024-05-01 DIAGNOSIS — E1165 Type 2 diabetes mellitus with hyperglycemia: Secondary | ICD-10-CM | POA: Diagnosis not present

## 2024-05-01 DIAGNOSIS — E039 Hypothyroidism, unspecified: Secondary | ICD-10-CM | POA: Diagnosis not present

## 2024-05-01 DIAGNOSIS — E781 Pure hyperglyceridemia: Secondary | ICD-10-CM | POA: Diagnosis not present

## 2024-05-07 DIAGNOSIS — I10 Essential (primary) hypertension: Secondary | ICD-10-CM | POA: Diagnosis not present

## 2024-05-07 DIAGNOSIS — E1165 Type 2 diabetes mellitus with hyperglycemia: Secondary | ICD-10-CM | POA: Diagnosis not present

## 2024-05-07 DIAGNOSIS — E1140 Type 2 diabetes mellitus with diabetic neuropathy, unspecified: Secondary | ICD-10-CM | POA: Diagnosis not present

## 2024-05-07 DIAGNOSIS — N189 Chronic kidney disease, unspecified: Secondary | ICD-10-CM | POA: Diagnosis not present

## 2024-05-07 DIAGNOSIS — E781 Pure hyperglyceridemia: Secondary | ICD-10-CM | POA: Diagnosis not present

## 2024-05-07 DIAGNOSIS — E7800 Pure hypercholesterolemia, unspecified: Secondary | ICD-10-CM | POA: Diagnosis not present

## 2024-05-07 DIAGNOSIS — E039 Hypothyroidism, unspecified: Secondary | ICD-10-CM | POA: Diagnosis not present

## 2024-06-01 ENCOUNTER — Other Ambulatory Visit: Payer: Self-pay | Admitting: Emergency Medicine

## 2024-06-18 DIAGNOSIS — Z794 Long term (current) use of insulin: Secondary | ICD-10-CM | POA: Diagnosis not present

## 2024-06-18 DIAGNOSIS — E039 Hypothyroidism, unspecified: Secondary | ICD-10-CM | POA: Diagnosis not present

## 2024-06-18 DIAGNOSIS — E1122 Type 2 diabetes mellitus with diabetic chronic kidney disease: Secondary | ICD-10-CM | POA: Diagnosis not present

## 2024-06-18 DIAGNOSIS — R32 Unspecified urinary incontinence: Secondary | ICD-10-CM | POA: Diagnosis not present

## 2024-06-18 DIAGNOSIS — F419 Anxiety disorder, unspecified: Secondary | ICD-10-CM | POA: Diagnosis not present

## 2024-06-18 DIAGNOSIS — M064 Inflammatory polyarthropathy: Secondary | ICD-10-CM | POA: Diagnosis not present

## 2024-06-18 DIAGNOSIS — N1831 Chronic kidney disease, stage 3a: Secondary | ICD-10-CM | POA: Diagnosis not present

## 2024-06-18 DIAGNOSIS — E1151 Type 2 diabetes mellitus with diabetic peripheral angiopathy without gangrene: Secondary | ICD-10-CM | POA: Diagnosis not present

## 2024-06-20 DIAGNOSIS — E11621 Type 2 diabetes mellitus with foot ulcer: Secondary | ICD-10-CM | POA: Diagnosis not present

## 2024-06-20 DIAGNOSIS — M79672 Pain in left foot: Secondary | ICD-10-CM | POA: Diagnosis not present

## 2024-06-20 DIAGNOSIS — M25572 Pain in left ankle and joints of left foot: Secondary | ICD-10-CM | POA: Diagnosis not present

## 2024-06-20 DIAGNOSIS — L97424 Non-pressure chronic ulcer of left heel and midfoot with necrosis of bone: Secondary | ICD-10-CM | POA: Diagnosis not present

## 2024-06-21 ENCOUNTER — Other Ambulatory Visit: Payer: Self-pay | Admitting: Orthopaedic Surgery

## 2024-06-21 DIAGNOSIS — M25572 Pain in left ankle and joints of left foot: Secondary | ICD-10-CM

## 2024-06-22 ENCOUNTER — Other Ambulatory Visit: Payer: Self-pay | Admitting: Orthopaedic Surgery

## 2024-06-22 DIAGNOSIS — M25572 Pain in left ankle and joints of left foot: Secondary | ICD-10-CM | POA: Diagnosis not present

## 2024-06-26 ENCOUNTER — Other Ambulatory Visit: Payer: Self-pay

## 2024-06-26 ENCOUNTER — Other Ambulatory Visit: Payer: Self-pay | Admitting: Emergency Medicine

## 2024-06-26 ENCOUNTER — Encounter (HOSPITAL_COMMUNITY): Payer: Self-pay | Admitting: Orthopaedic Surgery

## 2024-06-26 NOTE — Progress Notes (Signed)
 SDW CALL  Patient was given pre-op instructions over the phone. The opportunity was given for the patient to ask questions. No further questions asked. Patient verbalized understanding of instructions given.   PCP - Purcell Rubin Jose,MD Cardiologist - denies  PPM/ICD - denies Device Orders -  Rep Notified -   Chest x-ray - na EKG - DOS Stress Test - NM stress test 03/27/18 CE ECHO - 10/05/22 Cardiac Cath - denies  Sleep Study - +OSA CPAP - yes  Fasting Blood Sugar - 130 Checks Blood Sugar 1-2 times a day   WHAT DO I DO ABOUT MY DIABETES MEDICATION?  Do not take oral diabetes medicines (pills) the morning of surgery. Do not take Metformin(glucophage ) the day of surgery.   THE NIGHT BEFORE SURGERY, take 40 units of Tresiba insulin .     THE MORNING OF SURGERY, take 40 units of Tresiba insulin .  If your CBG is greater than 220 mg/dLthe morning of surgery , you may take  of your dose of Novolog insulin .  How do I manage my blood sugar before surgery? Check your blood sugar at least 4 times a day, starting 2 days before surgery, to make sure that the level is not too high or low.  Check your blood sugar the morning of your surgery when you wake up and every 2 hours until you get to the Short Stay unit.  If your blood sugar is less than 70 mg/dL, you will need to treat for low blood sugar: Do not take insulin . Treat a low blood sugar (less than 70 mg/dL) with  cup of clear juice (cranberry or apple), 4 glucose tablets, OR glucose gel. Recheck blood sugar in 15 minutes after treatment (to make sure it is greater than 70 mg/dL). If your blood sugar is not greater than 70 mg/dL on recheck, call [phone] for further instructions. Report your blood sugar to the short stay nurse when you get to Short Stay.  Blood Thinner Instructions:na Aspirin Instructions:na  ERAS Protcol -clears until 1045 PRE-SURGERY Ensure or G2- no  COVID TEST- na   Anesthesia review:  no  Patient denies shortness of breath, fever, cough and chest pain over the phone call  Special instructions:    Oral Hygiene is also important to reduce your risk of infection.  Remember - BRUSH YOUR TEETH THE MORNING OF SURGERY WITH YOUR REGULAR TOOTHPASTE

## 2024-06-28 ENCOUNTER — Encounter (HOSPITAL_COMMUNITY): Payer: Self-pay | Admitting: Orthopaedic Surgery

## 2024-06-28 ENCOUNTER — Other Ambulatory Visit: Payer: Self-pay

## 2024-06-28 ENCOUNTER — Ambulatory Visit (HOSPITAL_COMMUNITY): Admitting: Anesthesiology

## 2024-06-28 ENCOUNTER — Encounter (HOSPITAL_COMMUNITY)
Admission: RE | Disposition: A | Discharge status: Home Health | Payer: Self-pay | Source: Home / Self Care | Attending: Orthopaedic Surgery

## 2024-06-28 ENCOUNTER — Inpatient Hospital Stay (HOSPITAL_COMMUNITY)
Admission: RE | Admit: 2024-06-28 | Discharge: 2024-07-03 | DRG: 629 | Disposition: A | Discharge status: Home Health | Attending: Orthopaedic Surgery | Admitting: Orthopaedic Surgery

## 2024-06-28 DIAGNOSIS — Z8042 Family history of malignant neoplasm of prostate: Secondary | ICD-10-CM

## 2024-06-28 DIAGNOSIS — M869 Osteomyelitis, unspecified: Secondary | ICD-10-CM | POA: Diagnosis not present

## 2024-06-28 DIAGNOSIS — Z87891 Personal history of nicotine dependence: Secondary | ICD-10-CM

## 2024-06-28 DIAGNOSIS — E785 Hyperlipidemia, unspecified: Secondary | ICD-10-CM | POA: Diagnosis present

## 2024-06-28 DIAGNOSIS — B964 Proteus (mirabilis) (morganii) as the cause of diseases classified elsewhere: Secondary | ICD-10-CM | POA: Diagnosis present

## 2024-06-28 DIAGNOSIS — F419 Anxiety disorder, unspecified: Secondary | ICD-10-CM | POA: Diagnosis present

## 2024-06-28 DIAGNOSIS — Z794 Long term (current) use of insulin: Secondary | ICD-10-CM | POA: Diagnosis not present

## 2024-06-28 DIAGNOSIS — K219 Gastro-esophageal reflux disease without esophagitis: Secondary | ICD-10-CM | POA: Diagnosis present

## 2024-06-28 DIAGNOSIS — D72829 Elevated white blood cell count, unspecified: Secondary | ICD-10-CM | POA: Diagnosis present

## 2024-06-28 DIAGNOSIS — E1152 Type 2 diabetes mellitus with diabetic peripheral angiopathy with gangrene: Secondary | ICD-10-CM | POA: Diagnosis present

## 2024-06-28 DIAGNOSIS — I10 Essential (primary) hypertension: Secondary | ICD-10-CM

## 2024-06-28 DIAGNOSIS — Z803 Family history of malignant neoplasm of breast: Secondary | ICD-10-CM

## 2024-06-28 DIAGNOSIS — Z8249 Family history of ischemic heart disease and other diseases of the circulatory system: Secondary | ICD-10-CM

## 2024-06-28 DIAGNOSIS — E11621 Type 2 diabetes mellitus with foot ulcer: Secondary | ICD-10-CM | POA: Diagnosis present

## 2024-06-28 DIAGNOSIS — L89624 Pressure ulcer of left heel, stage 4: Secondary | ICD-10-CM | POA: Diagnosis not present

## 2024-06-28 DIAGNOSIS — Z59868 Other specified financial insecurity: Secondary | ICD-10-CM

## 2024-06-28 DIAGNOSIS — G4733 Obstructive sleep apnea (adult) (pediatric): Secondary | ICD-10-CM | POA: Diagnosis present

## 2024-06-28 DIAGNOSIS — E039 Hypothyroidism, unspecified: Secondary | ICD-10-CM | POA: Diagnosis present

## 2024-06-28 DIAGNOSIS — Z7984 Long term (current) use of oral hypoglycemic drugs: Secondary | ICD-10-CM | POA: Diagnosis not present

## 2024-06-28 DIAGNOSIS — E1169 Type 2 diabetes mellitus with other specified complication: Secondary | ICD-10-CM

## 2024-06-28 DIAGNOSIS — Z6841 Body Mass Index (BMI) 40.0 and over, adult: Secondary | ICD-10-CM

## 2024-06-28 DIAGNOSIS — E1122 Type 2 diabetes mellitus with diabetic chronic kidney disease: Secondary | ICD-10-CM | POA: Diagnosis not present

## 2024-06-28 DIAGNOSIS — Z820 Family history of epilepsy and other diseases of the nervous system: Secondary | ICD-10-CM

## 2024-06-28 DIAGNOSIS — Z7409 Other reduced mobility: Secondary | ICD-10-CM | POA: Diagnosis present

## 2024-06-28 DIAGNOSIS — Z79899 Other long term (current) drug therapy: Secondary | ICD-10-CM

## 2024-06-28 DIAGNOSIS — Z7985 Long-term (current) use of injectable non-insulin antidiabetic drugs: Secondary | ICD-10-CM | POA: Diagnosis not present

## 2024-06-28 DIAGNOSIS — E1140 Type 2 diabetes mellitus with diabetic neuropathy, unspecified: Secondary | ICD-10-CM | POA: Diagnosis present

## 2024-06-28 DIAGNOSIS — L97429 Non-pressure chronic ulcer of left heel and midfoot with unspecified severity: Secondary | ICD-10-CM | POA: Diagnosis present

## 2024-06-28 DIAGNOSIS — M86172 Other acute osteomyelitis, left ankle and foot: Secondary | ICD-10-CM | POA: Diagnosis present

## 2024-06-28 DIAGNOSIS — Z77090 Contact with and (suspected) exposure to asbestos: Secondary | ICD-10-CM | POA: Diagnosis present

## 2024-06-28 DIAGNOSIS — Z79890 Hormone replacement therapy (postmenopausal): Secondary | ICD-10-CM

## 2024-06-28 HISTORY — PX: INCISION AND DRAINAGE OF WOUND: SHX1803

## 2024-06-28 HISTORY — PX: APPLICATION OF WOUND VAC: SHX5189

## 2024-06-28 LAB — COMPREHENSIVE METABOLIC PANEL WITH GFR
ALT: 23 U/L (ref 0–44)
AST: 20 U/L (ref 15–41)
Albumin: 3 g/dL — ABNORMAL LOW (ref 3.5–5.0)
Alkaline Phosphatase: 57 U/L (ref 38–126)
Anion gap: 11 (ref 5–15)
BUN: 25 mg/dL — ABNORMAL HIGH (ref 8–23)
CO2: 24 mmol/L (ref 22–32)
Calcium: 9.3 mg/dL (ref 8.9–10.3)
Chloride: 100 mmol/L (ref 98–111)
Creatinine, Ser: 1.12 mg/dL (ref 0.61–1.24)
GFR, Estimated: >60 mL/min (ref >60)
Glucose, Bld: 110 mg/dL — ABNORMAL HIGH (ref 70–99)
Potassium: 4.6 mmol/L (ref 3.5–5.1)
Sodium: 135 mmol/L (ref 135–145)
Total Bilirubin: 0.4 mg/dL (ref 0.0–1.2)
Total Protein: 7.2 g/dL (ref 6.5–8.1)

## 2024-06-28 LAB — CBC
HCT: 43.4 % (ref 39.0–52.0)
Hemoglobin: 13.7 g/dL (ref 13.0–17.0)
MCH: 28.2 pg (ref 26.0–34.0)
MCHC: 31.6 g/dL (ref 30.0–36.0)
MCV: 89.5 fL (ref 80.0–100.0)
Platelets: 330 K/uL (ref 150–400)
RBC: 4.85 MIL/uL (ref 4.22–5.81)
RDW: 14.2 % (ref 11.5–15.5)
WBC: 11.7 K/uL — ABNORMAL HIGH (ref 4.0–10.5)
nRBC: 0 % (ref 0.0–0.2)

## 2024-06-28 LAB — GLUCOSE, CAPILLARY
Glucose-Capillary: 126 mg/dL — ABNORMAL HIGH (ref 70–99)
Glucose-Capillary: 116 mg/dL — ABNORMAL HIGH (ref 70–99)
Glucose-Capillary: 102 mg/dL — ABNORMAL HIGH (ref 70–99)
Glucose-Capillary: 204 mg/dL — ABNORMAL HIGH (ref 70–99)

## 2024-06-28 LAB — HEMOGLOBIN A1C
Hgb A1c MFr Bld: 7.3 % — ABNORMAL HIGH (ref 4.8–5.6)
Mean Plasma Glucose: 162.81 mg/dL

## 2024-06-28 SURGERY — IRRIGATION AND DEBRIDEMENT WOUND
Anesthesia: General | Laterality: Left

## 2024-06-28 MED ORDER — ACETAMINOPHEN 500 MG PO TABS
500.0000 mg | ORAL_TABLET | Freq: Four times a day (QID) | ORAL | Status: AC
  Administered 2024-06-28 – 2024-06-29 (×4): 500 mg via ORAL
  Filled 2024-06-28 (×4): qty 1

## 2024-06-28 MED ORDER — LORAZEPAM 2 MG/ML IJ SOLN: 1.0000 mg | INTRAMUSCULAR | Status: AC | PRN

## 2024-06-28 MED ORDER — ROCURONIUM BROMIDE 10 MG/ML (PF) SYRINGE
PREFILLED_SYRINGE | INTRAVENOUS | Status: AC
  Filled 2024-06-28: qty 10

## 2024-06-28 MED ORDER — CHLORHEXIDINE GLUCONATE 0.12 % MT SOLN: 15.0000 mL | Freq: Once | OROMUCOSAL | Status: DC

## 2024-06-28 MED ORDER — HYDRALAZINE HCL 20 MG/ML IJ SOLN: 10.0000 mg | Freq: Four times a day (QID) | INTRAMUSCULAR | Status: DC | PRN

## 2024-06-28 MED ORDER — CEFAZOLIN SODIUM-DEXTROSE 3-4 GM/150ML-% IV SOLN
3.0000 g | INTRAVENOUS | Status: AC
  Administered 2024-06-28: 3 g via INTRAVENOUS
  Filled 2024-06-28: qty 150

## 2024-06-28 MED ORDER — METHOCARBAMOL 1000 MG/10ML IJ SOLN: 500.0000 mg | Freq: Four times a day (QID) | INTRAMUSCULAR | Status: DC | PRN

## 2024-06-28 MED ORDER — FOLIC ACID 1 MG PO TABS
1.0000 mg | ORAL_TABLET | Freq: Every day | ORAL | Status: DC
  Administered 2024-06-28 – 2024-07-03 (×6): 1 mg via ORAL
  Filled 2024-06-28 (×6): qty 1

## 2024-06-28 MED ORDER — LORAZEPAM 1 MG PO TABS: 1.0000 mg | ORAL_TABLET | ORAL | Status: AC | PRN

## 2024-06-28 MED ORDER — ONDANSETRON HCL 4 MG/2ML IJ SOLN: 4.0000 mg | Freq: Four times a day (QID) | INTRAMUSCULAR | Status: DC | PRN

## 2024-06-28 MED ORDER — VANCOMYCIN HCL 1000 MG IV SOLR
INTRAVENOUS | Status: DC | PRN
  Administered 2024-06-28: 1000 mg

## 2024-06-28 MED ORDER — LIDOCAINE 2% (20 MG/ML) 5 ML SYRINGE
INTRAMUSCULAR | Status: AC
  Filled 2024-06-28: qty 5

## 2024-06-28 MED ORDER — SIMVASTATIN 20 MG PO TABS
20.0000 mg | ORAL_TABLET | Freq: Every day | ORAL | Status: DC
  Administered 2024-06-28 – 2024-07-03 (×6): 20 mg via ORAL
  Filled 2024-06-28 (×6): qty 1

## 2024-06-28 MED ORDER — FENTANYL CITRATE (PF) 100 MCG/2ML IJ SOLN
INTRAMUSCULAR | Status: AC
  Filled 2024-06-28: qty 2

## 2024-06-28 MED ORDER — ENOXAPARIN SODIUM 40 MG/0.4ML IJ SOSY
40.0000 mg | PREFILLED_SYRINGE | INTRAMUSCULAR | Status: DC
  Administered 2024-06-29: 40 mg via SUBCUTANEOUS
  Filled 2024-06-28: qty 0.4

## 2024-06-28 MED ORDER — DOCUSATE SODIUM 100 MG PO CAPS
100.0000 mg | ORAL_CAPSULE | Freq: Two times a day (BID) | ORAL | Status: DC
  Administered 2024-06-28 – 2024-07-02 (×8): 100 mg via ORAL
  Filled 2024-06-28 (×10): qty 1

## 2024-06-28 MED ORDER — LIDOCAINE 2% (20 MG/ML) 5 ML SYRINGE
INTRAMUSCULAR | Status: DC | PRN
  Administered 2024-06-28: 60 mg via INTRAVENOUS

## 2024-06-28 MED ORDER — VANCOMYCIN HCL 1000 MG IV SOLR
INTRAVENOUS | Status: AC
  Filled 2024-06-28: qty 20

## 2024-06-28 MED ORDER — LEVOTHYROXINE SODIUM 137 MCG PO TABS
274.0000 ug | ORAL_TABLET | Freq: Every day | ORAL | Status: DC
  Administered 2024-06-29 – 2024-07-03 (×5): 274 ug via ORAL
  Filled 2024-06-28 (×6): qty 2

## 2024-06-28 MED ORDER — POVIDONE-IODINE 7.5 % EX SOLN: Freq: Once | CUTANEOUS | Status: DC

## 2024-06-28 MED ORDER — MORPHINE SULFATE (PF) 2 MG/ML IV SOLN: 0.5000 mg | INTRAVENOUS | Status: DC | PRN

## 2024-06-28 MED ORDER — NAPROXEN 250 MG PO TABS
250.0000 mg | ORAL_TABLET | Freq: Two times a day (BID) | ORAL | Status: DC
  Administered 2024-06-28 – 2024-07-03 (×10): 250 mg via ORAL
  Filled 2024-06-28 (×10): qty 1

## 2024-06-28 MED ORDER — HYDROMORPHONE HCL 1 MG/ML IJ SOLN: 0.5000 mg | INTRAMUSCULAR | Status: DC | PRN

## 2024-06-28 MED ORDER — AMLODIPINE BESYLATE 10 MG PO TABS
10.0000 mg | ORAL_TABLET | Freq: Every day | ORAL | Status: DC
  Administered 2024-06-28 – 2024-07-03 (×6): 10 mg via ORAL
  Filled 2024-06-28 (×6): qty 1

## 2024-06-28 MED ORDER — INSULIN ASPART 100 UNIT/ML IJ SOLN
0.0000 [IU] | Freq: Every day | INTRAMUSCULAR | Status: DC
  Administered 2024-06-28: 2 [IU] via SUBCUTANEOUS
  Filled 2024-06-28: qty 2

## 2024-06-28 MED ORDER — DEXAMETHASONE SOD PHOSPHATE PF 10 MG/ML IJ SOLN
INTRAMUSCULAR | Status: DC | PRN
  Administered 2024-06-28: 10 mg via INTRAVENOUS

## 2024-06-28 MED ORDER — SUGAMMADEX SODIUM 200 MG/2ML IV SOLN
INTRAVENOUS | Status: DC | PRN
  Administered 2024-06-28: 400 mg via INTRAVENOUS

## 2024-06-28 MED ORDER — BISACODYL 5 MG PO TBEC
5.0000 mg | DELAYED_RELEASE_TABLET | Freq: Every day | ORAL | Status: DC | PRN
Start: 2024-06-28 — End: 2024-07-03

## 2024-06-28 MED ORDER — 0.9 % SODIUM CHLORIDE (POUR BTL) OPTIME
TOPICAL | Status: DC | PRN
  Administered 2024-06-28: 1000 mL

## 2024-06-28 MED ORDER — FENTANYL CITRATE (PF) 100 MCG/2ML IJ SOLN
INTRAMUSCULAR | Status: DC | PRN
  Administered 2024-06-28: 100 ug via INTRAVENOUS
  Administered 2024-06-28: 50 ug via INTRAVENOUS

## 2024-06-28 MED ORDER — MIDAZOLAM HCL 2 MG/2ML IJ SOLN
INTRAMUSCULAR | Status: AC
  Filled 2024-06-28: qty 2

## 2024-06-28 MED ORDER — THIAMINE MONONITRATE 100 MG PO TABS
100.0000 mg | ORAL_TABLET | Freq: Every day | ORAL | Status: DC
  Administered 2024-06-28 – 2024-07-03 (×6): 100 mg via ORAL
  Filled 2024-06-28 (×6): qty 1

## 2024-06-28 MED ORDER — SUCCINYLCHOLINE CHLORIDE 200 MG/10ML IV SOSY
PREFILLED_SYRINGE | INTRAVENOUS | Status: DC | PRN
  Administered 2024-06-28: 160 mg via INTRAVENOUS

## 2024-06-28 MED ORDER — PHENYLEPHRINE 80 MCG/ML (10ML) SYRINGE FOR IV PUSH (FOR BLOOD PRESSURE SUPPORT)
PREFILLED_SYRINGE | INTRAVENOUS | Status: AC
  Filled 2024-06-28: qty 10

## 2024-06-28 MED ORDER — PROPOFOL 10 MG/ML IV BOLUS
INTRAVENOUS | Status: DC | PRN
  Administered 2024-06-28: 200 mg via INTRAVENOUS

## 2024-06-28 MED ORDER — CHLORHEXIDINE GLUCONATE 0.12 % MT SOLN
OROMUCOSAL | Status: AC
  Administered 2024-06-28: 15 mL via OROMUCOSAL
  Filled 2024-06-28: qty 15

## 2024-06-28 MED ORDER — ONDANSETRON HCL 4 MG/2ML IJ SOLN
INTRAMUSCULAR | Status: DC | PRN
  Administered 2024-06-28: 4 mg via INTRAVENOUS

## 2024-06-28 MED ORDER — ACETAMINOPHEN 650 MG RE SUPP: 650.0000 mg | Freq: Four times a day (QID) | RECTAL | Status: DC | PRN

## 2024-06-28 MED ORDER — ACETAMINOPHEN 325 MG PO TABS
325.0000 mg | ORAL_TABLET | Freq: Four times a day (QID) | ORAL | Status: DC | PRN
  Administered 2024-07-02 – 2024-07-03 (×2): 650 mg via ORAL
  Filled 2024-06-28 (×2): qty 2

## 2024-06-28 MED ORDER — MIDAZOLAM HCL (PF) 2 MG/2ML IJ SOLN
INTRAMUSCULAR | Status: DC | PRN
  Administered 2024-06-28: 2 mg via INTRAVENOUS

## 2024-06-28 MED ORDER — SODIUM CHLORIDE 0.9 % IR SOLN
Status: DC | PRN
  Administered 2024-06-28: 3000 mL

## 2024-06-28 MED ORDER — CEFAZOLIN SODIUM-DEXTROSE 2-4 GM/100ML-% IV SOLN
2.0000 g | Freq: Four times a day (QID) | INTRAVENOUS | Status: AC
  Administered 2024-06-29 (×2): 2 g via INTRAVENOUS
  Filled 2024-06-28 (×2): qty 100

## 2024-06-28 MED ORDER — INSULIN GLARGINE-YFGN 100 UNIT/ML ~~LOC~~ SOLN
32.0000 [IU] | Freq: Two times a day (BID) | SUBCUTANEOUS | Status: DC
  Administered 2024-06-28: 32 [IU] via SUBCUTANEOUS
  Filled 2024-06-28 (×3): qty 0.32

## 2024-06-28 MED ORDER — SUCCINYLCHOLINE CHLORIDE 200 MG/10ML IV SOSY
PREFILLED_SYRINGE | INTRAVENOUS | Status: AC
  Filled 2024-06-28: qty 20

## 2024-06-28 MED ORDER — LACTATED RINGERS IV SOLN: INTRAVENOUS | Status: DC

## 2024-06-28 MED ORDER — OXYCODONE HCL 5 MG PO TABS: 5.0000 mg | ORAL_TABLET | Freq: Once | ORAL | Status: DC | PRN

## 2024-06-28 MED ORDER — ONDANSETRON HCL 4 MG PO TABS
4.0000 mg | ORAL_TABLET | Freq: Four times a day (QID) | ORAL | Status: DC | PRN
  Administered 2024-06-30 (×2): 4 mg via ORAL
  Filled 2024-06-28 (×2): qty 1

## 2024-06-28 MED ORDER — ROCURONIUM BROMIDE 10 MG/ML (PF) SYRINGE
PREFILLED_SYRINGE | INTRAVENOUS | Status: DC | PRN
  Administered 2024-06-28: 20 mg via INTRAVENOUS
  Administered 2024-06-28: 10 mg via INTRAVENOUS
  Administered 2024-06-28: 50 mg via INTRAVENOUS

## 2024-06-28 MED ORDER — INSULIN ASPART 100 UNIT/ML IJ SOLN
0.0000 [IU] | Freq: Three times a day (TID) | INTRAMUSCULAR | Status: DC
  Administered 2024-06-29 (×2): 2 [IU] via SUBCUTANEOUS
  Administered 2024-06-29: 3 [IU] via SUBCUTANEOUS
  Administered 2024-06-30: 2 [IU] via SUBCUTANEOUS
  Administered 2024-06-30: 1 [IU] via SUBCUTANEOUS
  Administered 2024-06-30 – 2024-07-01 (×2): 3 [IU] via SUBCUTANEOUS
  Administered 2024-07-01: 1 [IU] via SUBCUTANEOUS
  Administered 2024-07-01: 2 [IU] via SUBCUTANEOUS
  Administered 2024-07-02 (×2): 3 [IU] via SUBCUTANEOUS
  Administered 2024-07-02 – 2024-07-03 (×2): 2 [IU] via SUBCUTANEOUS
  Administered 2024-07-03: 3 [IU] via SUBCUTANEOUS
  Filled 2024-06-28: qty 1
  Filled 2024-06-28: qty 3
  Filled 2024-06-28: qty 2
  Filled 2024-06-28 (×2): qty 3
  Filled 2024-06-28: qty 2
  Filled 2024-06-28: qty 1
  Filled 2024-06-28: qty 3
  Filled 2024-06-28 (×4): qty 2
  Filled 2024-06-28 (×2): qty 3

## 2024-06-28 MED ORDER — ALPRAZOLAM 0.5 MG PO TABS
0.5000 mg | ORAL_TABLET | Freq: Every day | ORAL | Status: DC | PRN
Start: 2024-06-28 — End: 2024-07-03
  Administered 2024-06-29 – 2024-07-02 (×4): 0.5 mg via ORAL
  Filled 2024-06-28 (×4): qty 1

## 2024-06-28 MED ORDER — INSULIN DEGLUDEC 100 UNIT/ML ~~LOC~~ SOPN: 40.0000 [IU] | PEN_INJECTOR | Freq: Two times a day (BID) | SUBCUTANEOUS | Status: DC

## 2024-06-28 MED ORDER — CEFAZOLIN SODIUM-DEXTROSE 2-4 GM/100ML-% IV SOLN: 2.0000 g | Freq: Three times a day (TID) | INTRAVENOUS | Status: DC

## 2024-06-28 MED ORDER — PROPOFOL 10 MG/ML IV BOLUS
INTRAVENOUS | Status: AC
  Filled 2024-06-28: qty 20

## 2024-06-28 MED ORDER — INSULIN ASPART 100 UNIT/ML IJ SOLN
0.0000 [IU] | INTRAMUSCULAR | Status: DC | PRN
  Filled 2024-06-28: qty 0.14

## 2024-06-28 MED ORDER — HYDROCODONE-ACETAMINOPHEN 7.5-325 MG PO TABS
1.0000 | ORAL_TABLET | ORAL | Status: DC | PRN
  Administered 2024-06-28 – 2024-06-29 (×2): 1 via ORAL
  Administered 2024-06-29 – 2024-07-01 (×2): 2 via ORAL
  Filled 2024-06-28 (×5): qty 2

## 2024-06-28 MED ORDER — HYDROCODONE-ACETAMINOPHEN 5-325 MG PO TABS
1.0000 | ORAL_TABLET | ORAL | Status: DC | PRN
  Administered 2024-06-29: 2 via ORAL
  Filled 2024-06-28: qty 2

## 2024-06-28 MED ORDER — ADULT MULTIVITAMIN W/MINERALS CH
1.0000 | ORAL_TABLET | Freq: Every day | ORAL | Status: DC
  Administered 2024-06-28 – 2024-07-02 (×6): 1 via ORAL
  Filled 2024-06-28 (×6): qty 1

## 2024-06-28 MED ORDER — ALBUTEROL SULFATE (2.5 MG/3ML) 0.083% IN NEBU: 2.5000 mg | INHALATION_SOLUTION | Freq: Four times a day (QID) | RESPIRATORY_TRACT | Status: DC | PRN

## 2024-06-28 MED ORDER — CEFAZOLIN SODIUM-DEXTROSE 2-4 GM/100ML-% IV SOLN: 2.0000 g | Freq: Four times a day (QID) | INTRAVENOUS | Status: DC

## 2024-06-28 MED ORDER — ACETAMINOPHEN 325 MG PO TABS: 650.0000 mg | ORAL_TABLET | Freq: Four times a day (QID) | ORAL | Status: DC | PRN

## 2024-06-28 MED ORDER — TRAZODONE HCL 50 MG PO TABS
50.0000 mg | ORAL_TABLET | Freq: Every day | ORAL | Status: DC
  Administered 2024-06-28 – 2024-07-02 (×5): 50 mg via ORAL
  Filled 2024-06-28 (×5): qty 1

## 2024-06-28 MED ORDER — FENTANYL CITRATE (PF) 100 MCG/2ML IJ SOLN
25.0000 ug | INTRAMUSCULAR | Status: DC | PRN
  Administered 2024-06-28: 50 ug via INTRAVENOUS

## 2024-06-28 MED ORDER — ONDANSETRON HCL 4 MG/2ML IJ SOLN
INTRAMUSCULAR | Status: AC
  Filled 2024-06-28: qty 2

## 2024-06-28 MED ORDER — PIPERACILLIN-TAZOBACTAM 3.375 G IVPB
3.3750 g | Freq: Three times a day (TID) | INTRAVENOUS | Status: DC
  Administered 2024-06-28 – 2024-07-02 (×11): 3.375 g via INTRAVENOUS
  Filled 2024-06-28 (×11): qty 50

## 2024-06-28 MED ORDER — OXYCODONE HCL 5 MG/5ML PO SOLN: 5.0000 mg | Freq: Once | ORAL | Status: DC | PRN

## 2024-06-28 MED ORDER — ORAL CARE MOUTH RINSE: 15.0000 mL | Freq: Once | OROMUCOSAL | Status: DC

## 2024-06-28 MED ORDER — POLYETHYLENE GLYCOL 3350 17 G PO PACK: 17.0000 g | PACK | Freq: Every day | ORAL | Status: DC | PRN

## 2024-06-28 MED ORDER — VANCOMYCIN HCL 1750 MG/350ML IV SOLN
1750.0000 mg | Freq: Two times a day (BID) | INTRAVENOUS | Status: DC
  Administered 2024-06-28 – 2024-06-29 (×2): 1750 mg via INTRAVENOUS
  Filled 2024-06-28 (×2): qty 350

## 2024-06-28 MED ORDER — METOCLOPRAMIDE HCL 5 MG/ML IJ SOLN: 5.0000 mg | Freq: Three times a day (TID) | INTRAMUSCULAR | Status: DC | PRN

## 2024-06-28 MED ORDER — METHOCARBAMOL 500 MG PO TABS
500.0000 mg | ORAL_TABLET | Freq: Four times a day (QID) | ORAL | Status: DC | PRN
  Administered 2024-06-28 – 2024-07-02 (×8): 500 mg via ORAL
  Filled 2024-06-28 (×8): qty 1

## 2024-06-28 MED ORDER — METOCLOPRAMIDE HCL 5 MG PO TABS: 5.0000 mg | ORAL_TABLET | Freq: Three times a day (TID) | ORAL | Status: DC | PRN

## 2024-06-28 MED ORDER — DIPHENHYDRAMINE HCL 12.5 MG/5ML PO ELIX: 12.5000 mg | ORAL_SOLUTION | ORAL | Status: DC | PRN

## 2024-06-28 MED ORDER — THIAMINE HCL 100 MG/ML IJ SOLN: 100.0000 mg | Freq: Every day | INTRAMUSCULAR | Status: DC

## 2024-06-28 SURGICAL SUPPLY — 44 items
BAG COUNTER SPONGE SURGICOUNT (BAG) IMPLANT
BNDG COHESIVE 4X5 TAN STRL LF (GAUZE/BANDAGES/DRESSINGS) IMPLANT
BNDG COMPR ESMARK 4X3 LF (GAUZE/BANDAGES/DRESSINGS) IMPLANT
BNDG COMPR ESMARK 6X3 LF (GAUZE/BANDAGES/DRESSINGS) IMPLANT
BNDG ELASTIC 6INX 5YD STR LF (GAUZE/BANDAGES/DRESSINGS) IMPLANT
BNDG ELASTIC 6X10 VLCR STRL LF (GAUZE/BANDAGES/DRESSINGS) ×1 IMPLANT
BNDG GAUZE DERMACEA FLUFF 4 (GAUZE/BANDAGES/DRESSINGS) ×1 IMPLANT
CANISTER WOUNDNEG PRESSURE 500 (CANNISTER) IMPLANT
CONNECTOR 5 IN 1 STRAIGHT STRL (MISCELLANEOUS) IMPLANT
COVER SURGICAL LIGHT HANDLE (MISCELLANEOUS) ×2 IMPLANT
CUFF TRNQT CYL 34X4.125X (TOURNIQUET CUFF) ×1 IMPLANT
DRAPE DERMATAC (DRAPES) IMPLANT
DRAPE U-SHAPE 47X51 STRL (DRAPES) ×1 IMPLANT
DRSG VAC GRANUFOAM SM (GAUZE/BANDAGES/DRESSINGS) IMPLANT
DRSG XEROFORM 1X8 (GAUZE/BANDAGES/DRESSINGS) IMPLANT
DURAPREP 26ML APPLICATOR (WOUND CARE) ×1 IMPLANT
ELECTRODE REM PT RTRN 9FT ADLT (ELECTROSURGICAL) ×1 IMPLANT
GAUZE PAD ABD 8X10 STRL (GAUZE/BANDAGES/DRESSINGS) IMPLANT
GAUZE SPONGE 4X4 12PLY STRL (GAUZE/BANDAGES/DRESSINGS) IMPLANT
GAUZE SPONGE 4X4 12PLY STRL LF (GAUZE/BANDAGES/DRESSINGS) ×1 IMPLANT
GAUZE XEROFORM 1X8 LF (GAUZE/BANDAGES/DRESSINGS) ×1 IMPLANT
GLOVE BIOGEL M STRL SZ7.5 (GLOVE) ×1 IMPLANT
GLOVE INDICATOR 8.0 STRL GRN (GLOVE) ×1 IMPLANT
GLOVE SRG 8 PF TXTR STRL LF DI (GLOVE) ×1 IMPLANT
GOWN STRL REUS W/ TWL LRG LVL3 (GOWN DISPOSABLE) ×1 IMPLANT
GOWN STRL REUS W/ TWL XL LVL3 (GOWN DISPOSABLE) ×1 IMPLANT
KIT BASIN OR (CUSTOM PROCEDURE TRAY) ×1 IMPLANT
KIT TURNOVER KIT B (KITS) ×1 IMPLANT
MANIFOLD NEPTUNE II (INSTRUMENTS) ×1 IMPLANT
NDL 22X1.5 STRL (OR ONLY) (MISCELLANEOUS) IMPLANT
NEEDLE 22X1.5 STRL (OR ONLY) (MISCELLANEOUS) IMPLANT
PACK ORTHO EXTREMITY (CUSTOM PROCEDURE TRAY) ×1 IMPLANT
PAD ARMBOARD POSITIONER FOAM (MISCELLANEOUS) ×2 IMPLANT
PAD CAST 4YDX4 CTTN HI CHSV (CAST SUPPLIES) ×1 IMPLANT
SET CYSTO IRRIGATION (SET/KITS/TRAYS/PACK) IMPLANT
SET HNDPC FAN SPRY TIP SCT (DISPOSABLE) ×1 IMPLANT
SOLN 0.9% NACL POUR BTL 1000ML (IV SOLUTION) ×1 IMPLANT
SPONGE T-LAP 18X18 ~~LOC~~+RFID (SPONGE) IMPLANT
STOCKINETTE IMPERVIOUS 9X36 MD (GAUZE/BANDAGES/DRESSINGS) IMPLANT
SUT ETHILON 2 0 FS 18 (SUTURE) IMPLANT
SUT NYLON 3 0 (SUTURE) IMPLANT
TUBE CONNECTING 12X1/4 (SUCTIONS) ×1 IMPLANT
UNDERPAD 30X36 HEAVY ABSORB (UNDERPADS AND DIAPERS) ×1 IMPLANT
YANKAUER SUCT BULB TIP NO VENT (SUCTIONS) ×1 IMPLANT

## 2024-06-28 NOTE — Brief Op Note (Signed)
 06/28/2024  2:48 PM  PATIENT:  Johnny Andrews  63 y.o. 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, CALCANEAL OSTEOMYELITIS  PROCEDURE:  Procedure(s): IRRIGATION AND DEBRIDEMENT WOUND (Left) APPLICATION, WOUND VAC (Left) Calcaneal saucerization  SURGEON:  Surgeons and Role:    * Elsa Lonni SAUNDERS, MD - Primary  PHYSICIAN ASSISTANT: Jesse Jordan PA-C  ASSISTANTS: none   ANESTHESIA:   general  EBL:  20 mL   BLOOD ADMINISTERED:none  DRAINS: Wound vac   LOCAL MEDICATIONS USED:  Vanc Powder  SPECIMEN:  Source of Specimen:  heel soft tissue, calcaneus bone  DISPOSITION OF SPECIMEN:  Micro  COUNTS:  YES  TOURNIQUET:  * Missing tourniquet times found for documented tourniquets in log: [id number] *  DICTATION: .Dragon Dictation  PLAN OF CARE: Admit for overnight observation  PATIENT DISPOSITION:  PACU - hemodynamically stable.   Delay start of Pharmacological VTE agent (>24hrs) due to surgical blood loss or risk of bleeding: yes

## 2024-06-28 NOTE — Progress Notes (Signed)
 Pharmacy Antibiotic Note  Johnny Andrews is a 63 y.o. male admitted on 06/28/2024 with osteo.  Pharmacy has been consulted for vanc/zosyn dosing.  Plan: Vancomycin 1750 mg IV every 12 hours.  eAUC 491, Tss 14.9, scr 1.12 Zosyn 3.375g IV q8h (4 hour infusion).  Height: 6' 3 (190.5 cm) Weight: (!) 199.6 kg (440 lb) IBW/kg (Calculated) : 84.5  Temp (24hrs), Avg:98.5 F (36.9 C), Min:98 F (36.7 C), Max:98.8 F (37.1 C)  Recent Labs  Lab 06/28/24 1310  WBC 11.7*  CREATININE 1.12    Estimated Creatinine Clearance: 124.6 mL/min (by C-G formula based on SCr of 1.12 mg/dL).    Allergies  Allergen Reactions   Other     Dust mites     Thank you for allowing pharmacy to be a part of this patient's care.    Benedetta Heath BS, PharmD, BCPS Clinical Pharmacist 06/28/2024 5:11 PM  Contact: [phone] after 3 PM

## 2024-06-28 NOTE — Consult Note (Signed)
 [facility] Consult note  NOUR Andrews FMW:[id number] DOB: 1961-01-28 DOA: 06/28/2024 PCP: Purcell Emil Schanz, MD  Presented from: Home to OR Chief Complaint: Left calcaneal ulcer  History of Present Illness: Johnny Andrews is a 63 y.o. male with PMH significant for morbid obesity, OSA, DM2, HTN, HLD, alcohol use, Neuropathy, diabetic foot ulcer, hypothyroidism, osteoarthritis In the last 2 months, patient developed an ulceration in the left heel which progressively worsened and was associated with moderate to severe pain.  He was seen in Ortho clinic, was recommended to go to ED but patient was reluctant and hence brought in for an elective surgery today. He underwent I&D and application of wound VAC today by Dr. Elsa. Postoperatively, hospitalist service was consulted for inpatient management.  Afebrile, heart rate in 80s, blood pressure in 150s, postop on 5 L oxygen. Labs with WC count 11.7, BUNs/creatinine 25/1.12, glucose 116  At the time of my evaluation, patient was in PACU bed. Waking up from anesthesia.  Pain controlled.   Medical history and medicines reviewed.   Family not at bedside. I gave a call to his wife and updated her as well.   Review of Systems:  All systems were reviewed and were negative unless otherwise mentioned in the HPI   Past medical history: Past Medical History:  Diagnosis Date   ALCOHOL USE 10/24/2008   ANXIETY 09/19/2007   Cough 05/22/2008   Diabetes mellitus without complication (HCC)    Phreesia 06/02/2020   DIABETES MELLITUS, TYPE II 09/19/2007   External thrombosed hemorrhoids 08/23/2008   GOUT 04/17/2010   HYPERLIPIDEMIA 09/19/2007   HYPERTENSION 09/19/2007   Hypertension    Phreesia 06/02/2020   HYPOTHYROIDISM 09/19/2007   Leg pain    OSTEOARTHRITIS 09/19/2007   OTHER DISEASES OF LUNG NOT ELSEWHERE CLASSIFIED 07/20/2010   Rash and other nonspecific skin eruption 09/19/2007   Restrictive lung disease    SCROTAL  ABSCESS 05/27/2010   Sebaceous cyst 08/01/2008   SLEEP APNEA 08/22/2009   Sleep apnea    Phreesia 06/02/2020   SMOKER 04/17/2010   Thyroid  disease    Phreesia 06/02/2020    Past surgical history: Past Surgical History:  Procedure Laterality Date   COLONOSCOPY     NASAL SEPTUM SURGERY      Social History:  reports that he quit smoking about 17 years ago. His smoking use included cigarettes. He started smoking about 47 years ago. He has a 15 pack-year smoking history. He has never used smokeless tobacco. He reports current alcohol use. He reports that he does not currently use drugs.  Allergies:  Allergies  Allergen Reactions   Other     Dust mites   Other   Family history:  Family History  Problem Relation Age of Onset   Breast cancer Mother        Breast Cancer   Parkinson's disease Mother    Heart disease Father        CABG, stenting cardiac   Cancer Brother 36       prostate cancer   Lung disease Neg Hx    Autoimmune disease Neg Hx      Physical Exam: Vitals:   06/28/24 1530 06/28/24 1540 06/28/24 1545 06/28/24 1600  BP: 133/75  138/77 129/78  Pulse: 78 77 73 76  Resp: 17 (!) 24 (!) 22 (!) 25  Temp:      SpO2: 95% 92% 95% 90%  Weight:      Height:       Wt  Readings from Last 3 Encounters:  06/28/24 (!) 199.6 kg  11/23/23 (!) 199.6 kg  07/12/23 (!) 201.4 kg   Body mass index is 55 kg/m.  General exam: Pleasant, middle-aged morbidly obese male.  Pain controlled Skin: Johnny rashes, lesions or ulcers. HEENT: Atraumatic, normocephalic, Johnny obvious bleeding Lungs: Clear to auscultation bilaterally, on 5l oxygen CVS: S1, S2, Johnny murmur,   GI/Abd: Soft, nontender, distended from obesity, bowel sound present,   CNS: Alert, awake, oriented x 3 Psychiatry: Mood appropriate Extremities: Postop dressing on on left foot with wound VAC on.  Johnny pedal edema, Johnny calf tenderness,     ----------------------------------------------------------------------------------------------------------------------------------------- ----------------------------------------------------------------------------------------------------------------------------------------- -----------------------------------------------------------------------------------------------------------------------------------------  Assessment/Plan: Diabetic foot ulcer Left calcaneal osteomyelitis S/p I&D and wound VAC placement -11/13 Dr. Elsa IV Ancef given preoperatively Does not look septic. Will continue IV Ancef for now and wait for IntraOp culture  Postoperative respiratory insufficiency On 5 L oxygen postop.  Wean down as tolerated.  Does not use oxygen at home  Type 2 diabetes mellitus Diabetic neuropathy A1c 6.6 in 2024.  Repeat A1c PTA meds-Ozempic weekly, Tresiba 80 units twice daily, NovoLog twice daily Jardiance 25 mg daily Start Tresiba 40 units tonight along with SSI/Accu-Cheks Seems to be on Topamax  for neuropathy. Lab Results  Component Value Date   HGBA1C 6.6 (A) 07/12/2023   Recent Labs  Lab 06/28/24 1200 06/28/24 1505  GLUCAP 126* 116*   Hypertension PTA meds- lisinopril  40 mg daily, amlodipine 10 mg daily, olmesartan 40 mg daily, HCTZ 25 mg daily Resume amlodipine.  Need to confirm other meds.  Repeat creatinine tomorrow.  Continue to monitor blood pressure IV hydralazine as needed.  HLD Continue Zocor , fenofibrate   Hypothyroidism Synthroid   Anxiety Trazodone  nightly, Xanax  as needed  Morbid Obesity  Body mass index is 55 kg/m. Patient has been advised to make an attempt to improve diet and exercise patterns to aid in weight loss.  OSA Uses nocturnal CPAP He wants to use his own CPAP.  I have asked his wife to bring it from home.  If not, we can use ours.  Regular alcohol use Takes couple of drinks at night.  Last drink was last night.  Never had  withdrawal symptoms before.  He does not think he at risk of withdrawal.  Watch out for any withdrawal symptoms.  CIWA protocol with as needed Ativan ordered.  Home med list has not been obtained/updated by [facility] at this time.  Please double check admission reconciliation.    Mobility: Uses cane at baseline.  Needs PT eval.  Weightbearing restrictions per orthopedics PT Orders:   PT Follow up Rec:    Goals of care:   Code Status: Full Code    DVT prophylaxis:  enoxaparin (LOVENOX) injection 40 mg Start: 06/28/24 1545   Antimicrobials: IV Ancef Fluid: None Consultants: Orthopedics Family Communication: Called and updated his wife on the phone  Status: Observation Level of care:  Med-Surg   Patient is from: Home Anticipated d/c to: Pending clinical course, POD 0  Diet:  Diet Order             Diet Carb Modified Fluid consistency: Thin; Room service appropriate? Yes  Diet effective now                    ------------------------------------------------------------------------------------- Severity of Illness: The appropriate patient status for this patient is OBSERVATION. Observation status is judged to be reasonable and necessary in order to provide the required intensity of service to ensure the patient's safety.  The patient's presenting symptoms, physical exam findings, and initial radiographic and laboratory data in the context of their medical condition is felt to place them at decreased risk for further clinical deterioration. Furthermore, it is anticipated that the patient will be medically stable for discharge from the hospital within 2 midnights of admission.  -------------------------------------------------------------------------------------   Home Meds: Prior to Admission medications   Medication Sig Start Date End Date Taking? Authorizing Provider  ALPRAZolam  (XANAX ) 0.5 MG tablet TAKE 1 TABLET BY MOUTH TWICE A DAY AS NEEDED FOR ANXIETY Patient taking  differently: Take 0.5 mg by mouth daily as needed for anxiety. 04/10/24  Yes Sagardia, Emil Schanz, MD  amLODipine (NORVASC) 10 MG tablet Take 10 mg by mouth daily. 11/01/22  Yes [provider]  diclofenac  (VOLTAREN ) 75 MG EC tablet TAKE 1 TABLET BY MOUTH TWICE A DAY Patient taking differently: Take 150 mg by mouth every morning. 11/19/23  Yes Sagardia, Emil Schanz, MD  empagliflozin (JARDIANCE) 25 MG TABS tablet Take 25 mg by mouth daily.   Yes [provider]  fenofibrate  micronized (LOFIBRA) 134 MG capsule Take 134 mg by mouth daily. 01/07/23  Yes [provider]  insulin  aspart (NOVOLOG) 100 UNIT/ML FlexPen Inject 75 Units into the skin 2 (two) times daily before a meal. 30 units am and 50 units pm   Yes [provider]  levothyroxine  (SYNTHROID ) 137 MCG tablet TAKE 2 TABLETS (274 MCG TOTAL) BY MOUTH DAILY BEFORE BREAKFAST. 09/15/23 09/09/24 Yes Sagardia, Emil Schanz, MD  lisinopril  (ZESTRIL ) 20 MG tablet Take 40 mg by mouth every morning. 04/16/24  Yes [provider]  metFORMIN (GLUCOPHAGE-XR) 500 MG 24 hr tablet Take 1,000 mg by mouth 2 (two) times daily.  09/15/15  Yes [provider]  Multiple Vitamin (MULTIVITAMIN PO) Take 1 tablet by mouth daily at 12 noon.   Yes [provider]  olmesartan-hydrochlorothiazide (BENICAR HCT) 40-25 MG tablet TAKE 1 TABLET BY MOUTH EVERY DAY 06/26/24  Yes Sagardia, Emil Schanz, MD  Omega-3 Fatty Acids (FISH OIL PO) Take 1 capsule by mouth daily at 12 noon.   Yes [provider]  simvastatin  (ZOCOR ) 20 MG tablet Take 20 mg by mouth daily.   Yes [provider]  topiramate  (TOPAMAX ) 50 MG tablet TAKE 2 TABLETS BY MOUTH AT BEDTIME. 03/26/24  Yes Sagardia, Emil Schanz, MD  traZODone  (DESYREL ) 100 MG tablet TAKE 1 TABLET BY MOUTH EVERYDAY AT BEDTIME 06/01/24  Yes Sagardia, [place], MD  TRESIBA FLEXTOUCH 200 UNIT/ML SOPN INJECT 100 UNITS TWICE A DAY 12/17/16  Yes [provider]   blood glucose meter kit and supplies 1 each by Other route 2 (two) times daily. 03/14/20   Purcell Emil Schanz, MD  glucose blood (ONE TOUCH ULTRA TEST) test strip Two times a day for ULTRA 2 ONE TOUCH 02/28/20   Purcell Emil Schanz, MD  Insulin  Pen Needle (BD ULTRA-FINE PEN NEEDLES) 29G X 12.7MM MISC USE TWO CHECK BLOOD SUGAR TWICE A DAY. APPOINTMENT NEEDED FOR FURTHER REFILLS 02/28/20   Purcell Emil Schanz, MD  OZEMPIC, 1 MG/DOSE, 4 MG/3ML SOPN Inject 10 mg into the skin once a week.    [provider]  Respiratory Therapy Supplies (ADULT MASK) MISC 2 Devices by Does not apply route once. 11/24/11   Jude Harden GAILS, MD    Labs on Admission:   CBC: Recent Labs  Lab 06/28/24 1310  WBC 11.7*  HGB 13.7  HCT 43.4  MCV 89.5  PLT 330    Basic  Metabolic Panel: Recent Labs  Lab 06/28/24 1310  NA 135  K 4.6  CL 100  CO2 24  GLUCOSE 110*  BUN 25*  CREATININE 1.12  CALCIUM 9.3    Liver Function Tests: Recent Labs  Lab 06/28/24 1310  AST 20  ALT 23  ALKPHOS 57  BILITOT 0.4  PROT 7.2  ALBUMIN 3.0*   Johnny results for input(s): LIPASE, AMYLASE in the last 168 hours. Johnny results for input(s): AMMONIA in the last 168 hours.  Cardiac Enzymes: Johnny results for input(s): CKTOTAL, CKMB, CKMBINDEX, TROPONINI in the last 168 hours.  BNP (last 3 results) Johnny results for input(s): BNP in the last 8760 hours.  ProBNP (last 3 results) Johnny results for input(s): PROBNP in the last 8760 hours.  CBG: Recent Labs  Lab 06/28/24 1200 06/28/24 1505  GLUCAP 126* 116*    Lipase  Johnny results found for: LIPASE   Urinalysis    Component Value Date/Time   COLORURINE LT. YELLOW 02/05/2013 1010   APPEARANCEUR CLEAR 02/05/2013 1010   LABSPEC >=1.030 02/05/2013 1010   PHURINE 6.0 02/05/2013 1010   GLUCOSEU >=1000 02/05/2013 1010   HGBUR NEGATIVE 02/05/2013 1010   BILIRUBINUR negative 01/05/2017 1500   BILIRUBINUR neg 03/05/2015 1316   KETONESUR negative  01/05/2017 1500   KETONESUR NEGATIVE 02/05/2013 1010   PROTEINUR =100 (A) 01/05/2017 1500   PROTEINUR 100 03/05/2015 1316   UROBILINOGEN 0.2 01/05/2017 1500   UROBILINOGEN 0.2 02/05/2013 1010   NITRITE Negative 01/05/2017 1500   NITRITE neg 03/05/2015 1316   NITRITE NEGATIVE 02/05/2013 1010   LEUKOCYTESUR Negative 01/05/2017 1500     Drugs of Abuse  Johnny results found for: LABOPIA, COCAINSCRNUR, LABBENZ, AMPHETMU, THCU, LABBARB    Radiological Exams on Admission: Johnny results found.   Signed, Chapman Rota, MD [facility] 06/28/2024

## 2024-06-28 NOTE — H&P (Signed)
 PREOPERATIVE H&P  Chief Complaint: Left posterior calcaneal ulceration  HPI: Johnny Andrews is a 63 y.o. male who presents for preoperative history and physical with a diagnosis of left posterior calcaneal ulceration with early acute osteomyelitis of the calcaneus.  He noticed a wound approximately 2 months ago and was hoping it would heal but did not.  He presented to the clinic with that and was indicated for surgery and is here today for debridement and calcaneal saucerization and possible wound VAC placement.  He denies fevers or chills.. Symptoms are rated as moderate to severe, and have been worsening.  This is significantly impairing activities of daily living.  He has elected for surgical management.   Past Medical History:  Diagnosis Date   ALCOHOL USE 10/24/2008   ANXIETY 09/19/2007   Cough 05/22/2008   Diabetes mellitus without complication (HCC)    Phreesia 06/02/2020   DIABETES MELLITUS, TYPE II 09/19/2007   External thrombosed hemorrhoids 08/23/2008   GOUT 04/17/2010   HYPERLIPIDEMIA 09/19/2007   HYPERTENSION 09/19/2007   Hypertension    Phreesia 06/02/2020   HYPOTHYROIDISM 09/19/2007   Leg pain    OSTEOARTHRITIS 09/19/2007   OTHER DISEASES OF LUNG NOT ELSEWHERE CLASSIFIED 07/20/2010   Rash and other nonspecific skin eruption 09/19/2007   Restrictive lung disease    SCROTAL ABSCESS 05/27/2010   Sebaceous cyst 08/01/2008   SLEEP APNEA 08/22/2009   Sleep apnea    Phreesia 06/02/2020   SMOKER 04/17/2010   Thyroid  disease    Phreesia 06/02/2020   Past Surgical History:  Procedure Laterality Date   COLONOSCOPY     NASAL SEPTUM SURGERY     Social History   Socioeconomic History   Marital status: Married    Spouse name: Eugenio   Number of children: 2   Years of education: BS   Highest education level: Not on file  Occupational History   Occupation: Disabled/HVAC    Employer: [employer]  Tobacco Use   Smoking status: Former    Current packs/day: 0.00    Average packs/day:  0.5 packs/day for 30.0 years (15.0 ttl pk-yrs)    Types: Cigarettes    Start date: 33    Quit date: 2008    Years since quitting: 17.8   Smokeless tobacco: Never   Tobacco comments:    30 years at most off and on smoking 03/14/15  Vaping Use   Vaping status: Never Used  Substance and Sexual Activity   Alcohol use: Yes    Alcohol/week: 0.0 standard drinks of alcohol    Comment: 2 drinks per day   Drug use: Not Currently    Comment: Occasional Marijuana use   Sexual activity: Not on file  Other Topics Concern   Not on file  Social History Narrative   Originally from [place]. Has lived in [place], [place], [place], & [place] since 1993. 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 Currently has a dog & cats. Previously had a parrot (conure) 12 years ago for 1 year but in same house.  Has also had excessive exposure to pigeon feces. He has had very brief exposure to asbestos around 2000. No hot tub exposure. He has inhaled chemical fumes/gases/refridgerants through his work.      Marital status: married x 26 years      Children: 2 children (19, 3); no grandchildren      Lives: Lives at home with his wife  Employment: [employer]      Tobacco: none; quit in 2013; smoked x 1/2 ppd x 20 years      Alcohol:  10 drinks; usually 2 drinks per day and then skip two days.      Drugs:  None      Exercise: none; job is physically demanding      Right-handed.   1 cup caffeine daily.      Lives with wife and 1 dog/2025   Social Drivers of Health   Financial Resource Strain: Medium Risk (11/23/2023)   Overall Financial Resource Strain (CARDIA)    Difficulty of Paying Living Expenses: Somewhat hard  Food Insecurity: No Food Insecurity (11/23/2023)   Hunger Vital Sign    Worried About Running Out of Food in the Last Year: Never true    Ran Out of Food in the Last Year: Never true  Transportation Needs: No Transportation Needs (11/23/2023)    PRAPARE - Administrator, Civil Service (Medical): No    Lack of Transportation (Non-Medical): No  Physical Activity: Inactive (11/23/2023)   Exercise Vital Sign    Days of Exercise per Week: 0 days    Minutes of Exercise per Session: 0 min  Stress: Stress Concern Present (11/23/2023)   [employer] of Occupational Health - Occupational Stress Questionnaire    Feeling of Stress : To some extent  Social Connections: Moderately Isolated (11/23/2023)   Social Connection and Isolation Panel    Frequency of Communication with Friends and Family: More than three times a week    Frequency of Social Gatherings with Friends and Family: Once a week    Attends Religious Services: Never    Database Administrator or Organizations: No    Attends Engineer, Structural: Never    Marital Status: Married   Family History  Problem Relation Age of Onset   Breast cancer Mother        Breast Cancer   Parkinson's disease Mother    Heart disease Father        CABG, stenting cardiac   Cancer Brother 73       prostate cancer   Lung disease Neg Hx    Autoimmune disease Neg Hx    Allergies  Allergen Reactions   Other     Dust mites   Prior to Admission medications   Medication Sig Start Date End Date Taking? Authorizing Provider  amLODipine (NORVASC) 10 MG tablet Take 10 mg by mouth daily. 11/01/22  Yes [provider]  diclofenac  (VOLTAREN ) 75 MG EC tablet TAKE 1 TABLET BY MOUTH TWICE A DAY Patient taking differently: Take 150 mg by mouth every morning. 11/19/23  Yes Sagardia, Emil Schanz, MD  empagliflozin (JARDIANCE) 25 MG TABS tablet Take 25 mg by mouth daily.   Yes [provider]  fenofibrate  micronized (LOFIBRA) 134 MG capsule Take 134 mg by mouth daily. 01/07/23  Yes [provider]  insulin  aspart (NOVOLOG) 100 UNIT/ML FlexPen Inject 75 Units into the skin 2 (two) times daily before a meal. 30 units am and 50 units pm   Yes [provider]   levothyroxine  (SYNTHROID ) 137 MCG tablet TAKE 2 TABLETS (274 MCG TOTAL) BY MOUTH DAILY BEFORE BREAKFAST. 09/15/23 09/09/24 Yes Sagardia, Emil Schanz, MD  lisinopril  (ZESTRIL ) 20 MG tablet Take 40 mg by mouth every morning. 04/16/24  Yes [provider]  metFORMIN (GLUCOPHAGE-XR) 500 MG 24 hr tablet Take 1,000 mg by mouth 2 (two) times  daily.  09/15/15  Yes [provider]  Multiple Vitamin (MULTIVITAMIN PO) Take 1 tablet by mouth daily at 12 noon.   Yes [provider]  Omega-3 Fatty Acids (FISH OIL PO) Take 1 capsule by mouth daily at 12 noon.   Yes [provider]  simvastatin  (ZOCOR ) 20 MG tablet Take 20 mg by mouth daily.   Yes [provider]  topiramate  (TOPAMAX ) 50 MG tablet TAKE 2 TABLETS BY MOUTH AT BEDTIME. 03/26/24  Yes Sagardia, Emil Schanz, MD  traZODone  (DESYREL ) 100 MG tablet TAKE 1 TABLET BY MOUTH EVERYDAY AT BEDTIME 06/01/24  Yes Sagardia, [place], MD  TRESIBA FLEXTOUCH 200 UNIT/ML SOPN INJECT 100 UNITS TWICE A DAY 12/17/16  Yes [provider]  ALPRAZolam  (XANAX ) 0.5 MG tablet TAKE 1 TABLET BY MOUTH TWICE A DAY AS NEEDED FOR ANXIETY Patient taking differently: Take 0.5 mg by mouth daily as needed for anxiety. 04/10/24   Purcell Emil Schanz, MD  blood glucose meter kit and supplies 1 each by Other route 2 (two) times daily. 03/14/20   Purcell Emil Schanz, MD  glucose blood (ONE TOUCH ULTRA TEST) test strip Two times a day for ULTRA 2 ONE TOUCH 02/28/20   Purcell Emil Schanz, MD  Insulin  Pen Needle (BD ULTRA-FINE PEN NEEDLES) 29G X 12.7MM MISC USE TWO CHECK BLOOD SUGAR TWICE A DAY. APPOINTMENT NEEDED FOR FURTHER REFILLS 02/28/20   Purcell Emil Schanz, MD  olmesartan-hydrochlorothiazide (BENICAR HCT) 40-25 MG tablet TAKE 1 TABLET BY MOUTH EVERY DAY 06/26/24   Sagardia, Emil Schanz, MD  OZEMPIC, 1 MG/DOSE, 4 MG/3ML SOPN Inject 10 mg into the skin once a week.    [provider]  Respiratory Therapy Supplies (ADULT MASK)  MISC 2 Devices by Does not apply route once. 11/24/11   Jude Harden GAILS, MD     Positive ROS: All other systems have been reviewed and were otherwise negative with the exception of those mentioned in the HPI and as above.  Physical Exam:  There were no vitals filed for this visit. General: Alert, no acute distress Cardiovascular: No pedal edema Respiratory: No cyanosis, no use of accessory musculature GI: No organomegaly, abdomen is soft and non-tender Skin: No lesions in the area of chief complaint Neurologic: Sensation intact distally Psychiatric: Patient is competent for consent with normal mood and affect Lymphatic: No axillary or cervical lymphadenopathy  MUSCULOSKELETAL: Left heel demonstrates 5 x 5 cm ulceration the posterior lateral aspect of the tuberosity area.  There is exposed necrotic fatty tissue.  Foul odor and drainage.  Skin loss.  Assessment: Left posterior calcaneal ulceration with necrotic tissue and skin loss with MRI evidence of bony edema within the posterior lateral aspect of the calcaneal tuberosity concerning for acute osteomyelitis   Plan: Plan for debridement of the ulceration and calcaneal saucerization and possible wound VAC placement.  The patient may end up with skin loss and exposed periosteal tissue or bone.  Hopefully he will not need flap coverage in the future but we need to control the infection.  Tissue will be sent for culture.  We discussed the risks, benefits and alternatives of surgery which include but are not limited to wound healing complications, infection, nonunion, malunion, need for further surgery, damage to surrounding structures and continued pain.  They understand there is no guarantees to an acceptable outcome.  After weighing these risks they opted to proceed with surgery.     Lonni JONELLE Pae, MD    06/28/2024 11:53 AM

## 2024-06-28 NOTE — Anesthesia Procedure Notes (Signed)
 Procedure Name: Intubation Date/Time: 06/28/2024 2:01 PM  Performed by: Carolee Lauraine DASEN, CRNAPre-anesthesia Checklist: Patient identified, Emergency Drugs available, Suction available and Patient being monitored Patient Re-evaluated:Patient Re-evaluated prior to induction Oxygen Delivery Method: Circle System Utilized Preoxygenation: Pre-oxygenation with 100% oxygen Induction Type: IV induction and Rapid sequence Laryngoscope Size: Glidescope and 4 Grade View: Grade I Tube type: Oral Tube size: 7.5 mm Number of attempts: 1 Airway Equipment and Method: Stylet and Oral airway Placement Confirmation: ETT inserted through vocal cords under direct vision, positive ETCO2 and breath sounds checked- equal and bilateral Secured at: 24 cm Tube secured with: Tape Dental Injury: Teeth and Oropharynx as per pre-operative assessment

## 2024-06-28 NOTE — Anesthesia Preprocedure Evaluation (Signed)
 Anesthesia Evaluation  Patient identified by MRN, date of birth, ID band Patient awake    Reviewed: Allergy & Precautions, H&P , NPO status , Patient's Chart, lab work & pertinent test results  Airway Mallampati: II   Neck ROM: full    Dental   Pulmonary sleep apnea , former smoker   breath sounds clear to auscultation       Cardiovascular hypertension,  Rhythm:regular Rate:Normal     Neuro/Psych  PSYCHIATRIC DISORDERS Anxiety      Neuromuscular disease    GI/Hepatic   Endo/Other  diabetes, Type 2Hypothyroidism  Class 4 obesity  Renal/GU      Musculoskeletal  (+) Arthritis ,    Abdominal   Peds  Hematology   Anesthesia Other Findings   Reproductive/Obstetrics                              Anesthesia Physical Anesthesia Plan  ASA: 3  Anesthesia Plan: General   Post-op Pain Management:    Induction: Intravenous  PONV Risk Score and Plan: 2 and Ondansetron, Dexamethasone , Midazolam and Treatment may vary due to age or medical condition  Airway Management Planned: Oral ETT and Video Laryngoscope Planned  Additional Equipment:   Intra-op Plan:   Post-operative Plan: Extubation in OR  Informed Consent: I have reviewed the patients History and Physical, chart, labs and discussed the procedure including the risks, benefits and alternatives for the proposed anesthesia with the patient or authorized representative who has indicated his/her understanding and acceptance.     Dental advisory given  Plan Discussed with: CRNA, Anesthesiologist and Surgeon  Anesthesia Plan Comments:         Anesthesia Quick Evaluation

## 2024-06-28 NOTE — Transfer of Care (Signed)
 Immediate Anesthesia Transfer of Care Note  Patient: Johnny Andrews  Procedure(s) Performed: IRRIGATION AND DEBRIDEMENT WOUND (Left) APPLICATION, WOUND VAC (Left)  Patient Location: PACU  Anesthesia Type:General  Level of Consciousness: awake, alert , oriented, and patient cooperative  Airway & Oxygen Therapy: Patient Spontanous Breathing and Patient connected to face mask oxygen  Post-op Assessment: Report given to RN, Post -op Vital signs reviewed and stable, Patient moving all extremities, and Patient moving all extremities X 4  Post vital signs: Reviewed and stable  Last Vitals:  Vitals Value Taken Time  BP 150/80 06/28/24 15:03  Temp 37.1 C 06/28/24 15:03  Pulse 85 06/28/24 15:08  Resp 32 06/28/24 15:08  SpO2 95 % 06/28/24 15:08  Vitals shown include unfiled device data.  Last Pain:  Vitals:   06/28/24 1503  PainSc: Asleep         Complications: No notable events documented.

## 2024-06-29 ENCOUNTER — Encounter (HOSPITAL_COMMUNITY): Payer: Self-pay | Admitting: Orthopaedic Surgery

## 2024-06-29 DIAGNOSIS — M869 Osteomyelitis, unspecified: Secondary | ICD-10-CM | POA: Diagnosis not present

## 2024-06-29 LAB — CBC
HCT: 40.7 % (ref 39.0–52.0)
Hemoglobin: 13.1 g/dL (ref 13.0–17.0)
MCH: 28.6 pg (ref 26.0–34.0)
MCHC: 32.2 g/dL (ref 30.0–36.0)
MCV: 88.9 fL (ref 80.0–100.0)
Platelets: 328 K/uL (ref 150–400)
RBC: 4.58 MIL/uL (ref 4.22–5.81)
RDW: 14 % (ref 11.5–15.5)
WBC: 15 K/uL — ABNORMAL HIGH (ref 4.0–10.5)
nRBC: 0 % (ref 0.0–0.2)

## 2024-06-29 LAB — HIV ANTIBODY (ROUTINE TESTING W REFLEX): HIV Screen 4th Generation wRfx: NONREACTIVE

## 2024-06-29 LAB — BASIC METABOLIC PANEL WITH GFR
Anion gap: 10 (ref 5–15)
BUN: 26 mg/dL — ABNORMAL HIGH (ref 8–23)
CO2: 23 mmol/L (ref 22–32)
Calcium: 8.9 mg/dL (ref 8.9–10.3)
Chloride: 100 mmol/L (ref 98–111)
Creatinine, Ser: 1.25 mg/dL — ABNORMAL HIGH (ref 0.61–1.24)
GFR, Estimated: >60 mL/min (ref >60)
Glucose, Bld: 202 mg/dL — ABNORMAL HIGH (ref 70–99)
Potassium: 4.7 mmol/L (ref 3.5–5.1)
Sodium: 133 mmol/L — ABNORMAL LOW (ref 135–145)

## 2024-06-29 LAB — GLUCOSE, CAPILLARY
Glucose-Capillary: 213 mg/dL — ABNORMAL HIGH (ref 70–99)
Glucose-Capillary: 170 mg/dL — ABNORMAL HIGH (ref 70–99)
Glucose-Capillary: 167 mg/dL — ABNORMAL HIGH (ref 70–99)
Glucose-Capillary: 190 mg/dL — ABNORMAL HIGH (ref 70–99)
Glucose-Capillary: 147 mg/dL — ABNORMAL HIGH (ref 70–99)

## 2024-06-29 MED ORDER — TOPIRAMATE 100 MG PO TABS
100.0000 mg | ORAL_TABLET | Freq: Every day | ORAL | Status: DC
  Administered 2024-06-29 – 2024-07-02 (×4): 100 mg via ORAL
  Filled 2024-06-29 (×4): qty 1

## 2024-06-29 MED ORDER — VANCOMYCIN HCL 1500 MG/300ML IV SOLN
1500.0000 mg | Freq: Two times a day (BID) | INTRAVENOUS | Status: DC
  Administered 2024-06-29 – 2024-07-02 (×6): 1500 mg via INTRAVENOUS
  Filled 2024-06-29 (×6): qty 300

## 2024-06-29 MED ORDER — INSULIN GLARGINE-YFGN 100 UNIT/ML ~~LOC~~ SOLN
40.0000 [IU] | Freq: Two times a day (BID) | SUBCUTANEOUS | Status: DC
  Administered 2024-06-29 – 2024-06-30 (×4): 40 [IU] via SUBCUTANEOUS
  Filled 2024-06-29 (×6): qty 0.4

## 2024-06-29 NOTE — Progress Notes (Signed)
   06/29/24 2045  BiPAP/CPAP/SIPAP  BiPAP/CPAP/SIPAP Pt Type Adult (PT places himself on home unit. RT will assist as needed.)  BiPAP/CPAP/SIPAP Resmed  FiO2 (%) 21 %  Patient Home Machine Yes  Safety Check Completed by RT for Home Unit Yes, no issues noted  Patient Home Mask Yes  Patient Home Tubing Yes  Device Plugged into RED Power Outlet Yes

## 2024-06-29 NOTE — Progress Notes (Signed)
 PROGRESS NOTE  THANIEL Andrews  DOB: 1961/07/20  PCP: Purcell Emil Schanz, MD FMW:[id number]  DOA: 06/28/2024  LOS: 0 days  Hospital Day: 2  Subjective: Patient was seen and examined this a.m. Lying on bed.  Not in distress.  Slept well last night. Afebrile, blood pressure 140s and 150s overnight, breathing room air Blood sugar level 170 this morning Labs from this morning which BUN/creatinine 26/1.5, WBC count 15  Brief narrative: Johnny Andrews is a 63 y.o. male with PMH significant for morbid obesity, OSA, DM2, HTN, HLD, alcohol use, Neuropathy, diabetic foot ulcer, hypothyroidism, osteoarthritis In the last 2 months, patient developed an ulceration in the left heel which progressively worsened and was associated with moderate to severe pain.  He was seen in Ortho clinic, was recommended to go to ED but patient was reluctant and hence brought in for an elective surgery today. He underwent I&D and application of wound VAC today by Dr. Elsa. Postoperatively, hospitalist service was consulted for inpatient management.  Assessment/Plan: Diabetic foot ulcer Left calcaneal osteomyelitis S/p I&D and wound VAC placement -11/13 Dr. Elsa IV Ancef given preoperatively Does not look septic. Currently on broad-spectrum coverage with IV Zosyn and vancomycin Recent Labs  Lab 06/28/24 1310 06/29/24 0243  WBC 11.7* 15.0*   Postoperative respiratory insufficiency OSA Not on supplemental oxygen at home.  Uses nightly CPAP  Postop required  5 L oxygen postop.   Gradually weaned down.   Continue nocturnal CPAP  Type 2 diabetes mellitus Diabetic neuropathy A1c 7.3 on 06/28/2024 PTA meds-Ozempic weekly, Tresiba 80 units twice daily, NovoLog twice daily Jardiance 25 mg daily, metformin 1000 mg twice daily Currently on Semglee  32 units twice daily along with SSI/Accu-Cheks Increase Semglee  to 40 units twice daily starting this morning Seems to be on Topamax  for neuropathy. Recent  Labs  Lab 06/28/24 1705 06/28/24 2045 06/29/24 0533 06/29/24 0803 06/29/24 1212  GLUCAP 102* 204* 213* 170* 167*   Hypertension PTA meds- amlodipine 10 mg daily, lisinopril  40 mg daily, olmesartan 40 mg daily, HCTZ 25 mg daily Currently continued on amlodipine 10 mg daily only. Continue IV hydralazine as needed.  Elevated creatinine Noted bump in creatinine.  Diuretics on hold.  Encourage oral hydration.  Continue monitor Recent Labs    06/28/24 1310 06/29/24 0243  BUN 25* 26*  CREATININE 1.12 1.25*  CO2 24 23    HLD Continue Zocor , fenofibrate   Hypothyroidism Synthroid   Anxiety Trazodone  nightly, Xanax  as needed  Morbid Obesity  Body mass index is 55 kg/m. Patient has been advised to make an attempt to improve diet and exercise patterns to aid in weight loss.  Regular alcohol use Takes couple of drinks at night.  Last drink was last night.  Never had withdrawal symptoms before.  He does not think he at risk of withdrawal.  Watch out for any withdrawal symptoms.  CIWA protocol with as needed Ativan ordered.   Mobility: Uses cane at baseline.  Needs PT eval.  Weightbearing restrictions per orthopedics PT Orders:   PT Follow up Rec: Home Health Pt (Pt Reports He Will Decline Follow-Up Therapy)06/29/2024 1300   Goals of care:   Code Status: Full Code    DVT prophylaxis:  enoxaparin (LOVENOX) injection 40 mg Start: 06/29/24 1500 SCDs Start: 06/28/24 1706   Antimicrobials: IV Zosyn and vancomycin Fluid: None Consultants: Orthopedics Family Communication: Not at bedside  Status: Observation Level of care:  Med-Surg   Patient is from: Home Anticipated d/c to: Pending wound culture report,  POD 1  Diet:  Diet Order             Diet Carb Modified Fluid consistency: Thin; Room service appropriate? Yes  Diet effective now                   Scheduled Meds:  amLODipine  10 mg Oral Daily   docusate sodium  100 mg Oral BID   enoxaparin (LOVENOX)  injection  40 mg Subcutaneous Q24H   folic acid  1 mg Oral Daily   insulin  aspart  0-5 Units Subcutaneous QHS   insulin  aspart  0-9 Units Subcutaneous TID WC   insulin  glargine-yfgn  40 Units Subcutaneous BID   levothyroxine   274 mcg Oral QAC breakfast   multivitamin with minerals  1 tablet Oral Daily   naproxen  250 mg Oral BID WC   simvastatin   20 mg Oral Daily   thiamine  100 mg Oral Daily   Or   thiamine  100 mg Intravenous Daily   topiramate   100 mg Oral QHS   traZODone   50 mg Oral QHS    PRN meds: acetaminophen , albuterol , ALPRAZolam , bisacodyl, diphenhydrAMINE, hydrALAZINE, HYDROcodone -acetaminophen , HYDROcodone -acetaminophen , HYDROmorphone (DILAUDID) injection, LORazepam **OR** LORazepam, methocarbamol **OR** methocarbamol (ROBAXIN) injection, metoCLOPramide **OR** metoCLOPramide (REGLAN) injection, morphine injection, ondansetron **OR** ondansetron (ZOFRAN) IV, polyethylene glycol   Infusions:   piperacillin-tazobactam (ZOSYN)  IV 3.375 g (06/29/24 0743)   vancomycin      Antimicrobials: Anti-infectives (From admission, onward)    Start     Dose/Rate Route Frequency Ordered Stop   06/29/24 1800  vancomycin (VANCOREADY) IVPB 1500 mg/300 mL        1,500 mg 150 mL/hr over 120 Minutes Intravenous Every 12 hours 06/29/24 0953     06/28/24 2200  ceFAZolin (ANCEF) IVPB 2g/100 mL premix  Status:  Discontinued        2 g 200 mL/hr over 30 Minutes Intravenous Every 8 hours 06/28/24 1603 06/28/24 1710   06/28/24 2000  ceFAZolin (ANCEF) IVPB 2g/100 mL premix        2 g 200 mL/hr over 30 Minutes Intravenous Every 6 hours 06/28/24 1730 06/29/24 0707   06/28/24 1800  ceFAZolin (ANCEF) IVPB 2g/100 mL premix  Status:  Discontinued        2 g 200 mL/hr over 30 Minutes Intravenous Every 6 hours 06/28/24 1705 06/28/24 1730   06/28/24 1800  piperacillin-tazobactam (ZOSYN) IVPB 3.375 g        3.375 g 12.5 mL/hr over 240 Minutes Intravenous Every 8 hours 06/28/24 1710     06/28/24 1800   vancomycin (VANCOREADY) IVPB 1750 mg/350 mL  Status:  Discontinued        1,750 mg 175 mL/hr over 120 Minutes Intravenous Every 12 hours 06/28/24 1710 06/29/24 0953   06/28/24 1429  vancomycin (VANCOCIN) powder  Status:  Discontinued          As needed 06/28/24 1430 06/28/24 1500   06/28/24 1230  ceFAZolin (ANCEF) IVPB 3g/150 mL premix        3 g 300 mL/hr over 30 Minutes Intravenous On call to O.R. 06/28/24 1133 06/28/24 1435       Objective: Vitals:   06/29/24 0440 06/29/24 0804  BP: (!) 158/71 (!) 149/81  Pulse: 74 80  Resp: 18 18  Temp: 98 F (36.7 C) 98.4 F (36.9 C)  SpO2: 93% 93%    Intake/Output Summary (Last 24 hours) at 06/29/2024 1410 Last data filed at 06/29/2024 1228 Gross per 24 hour  Intake 500 ml  Output 920 ml  Net -420 ml   Filed Weights   06/28/24 1147  Weight: (!) 199.6 kg   Weight change:  Body mass index is 55 kg/m.   Physical Exam: General exam: Pleasant, middle-aged morbidly obese male.  Pain controlled. Skin: No rashes, lesions or ulcers. HEENT: Atraumatic, normocephalic, no obvious bleeding Lungs: Clear to auscultation bilaterally, not on oxygen this morning CVS: S1, S2, no murmur,   GI/Abd: Soft, nontender, distended from obesity, bowel sound present,   CNS: Alert, awake, oriented x 3 Psychiatry: Mood appropriate Extremities: Postop dressing on on left foot with wound VAC on.  No pedal edema, no calf tenderness,   Data Review: I have personally reviewed the laboratory data and studies available.  F/u labs ordered Unresulted Labs (From admission, onward)     Start     Ordered   06/30/24 0500  Basic metabolic panel with GFR  Tomorrow morning,   R        06/29/24 1410   06/30/24 0500  CBC with Differential/Platelet  Tomorrow morning,   R        06/29/24 1410            Signed, Chapman Rota, MD [facility] 06/29/2024

## 2024-06-29 NOTE — TOC CAGE-AID Note (Signed)
 Transition of Care [facility]) - CAGE-AID Screening   Patient Details  Name: Johnny Andrews MRN: [id number] Date of Birth: February 06, 1961  [facility]) CM/SW Contact:    Bridget Cordella Simmonds, LCSW Phone Number: 06/29/2024, 1:31 PM   Clinical Narrative: CSW met with pt for Cage aid.  Pt reports he has 2 drinks daily at Happy hour (5pm).  Denies drug use.  Has not had problems with his drinking, does not drink more than that.  He is able to take care of all his responsibilities during the day.  One DWI when he was 20 and he completed a class for this charge.  Pt does not feel this level of use is a problem for him.     CAGE-AID Screening:    Have You Ever Felt You Ought to Cut Down on Your Drinking or Drug Use?: No Have People Annoyed You By Critizing Your Drinking Or Drug Use?: No Have You Felt Bad Or Guilty About Your Drinking Or Drug Use?: No Have You Ever Had a Drink or Used Drugs First Thing In The Morning to Steady Your Nerves or to Get Rid of a Hangover?: No CAGE-AID Score: 0  Substance Abuse Education Offered: Yes  Substance abuse interventions: Patient Counseling

## 2024-06-29 NOTE — Progress Notes (Signed)
     Johnny Andrews is a 63 y.o. male   Orthopaedic diagnosis: Status post I&D of stage IV left calcaneal ulcer and calcaneal saucerization of left calcaneal osteomyelitis with application of wound VAC 06/28/2024  Subjective: Patient resting comfortably.  He denies significant pain.  Preliminary results of intraoperative cultures suggesting multiple organisms but no formal organism identification yet.  Hospital service has been consulted and he is on broad spectrum antibiotics.  Prior to admission, he resided at home with his wife.  Therapy evaluation pending.  Objectyive: Vitals:   06/29/24 0440 06/29/24 0804  BP: (!) 158/71 (!) 149/81  Pulse: 74 80  Resp: 18 18  Temp: 98 F (36.7 C) 98.4 F (36.9 C)  SpO2: 93% 93%     Exam: Awake and alert Respirations even and unlabored No acute distress Patient is morbidly obese  Left foot and ankle with dressing and wound VAC in place.  Wound VAC with good suction and output.  Baseline swelling in the lower extremities.  No calf tenderness.  He can wiggle the toes.  He has diminished sensation to light touch in the exposed forefoot and toes which he reports is his baseline.  Toes feel warm and well-perfused distally.  Assessment: Post op day 1 status post the above  Plan: - Keep dressing and wound VAC in place.  Empty wound VAC as needed. - Avoid pressure on the heel, ensure heel is elevated at all times. - Partial weightbearing operative extremity through the forefoot only with a walker.  No pressure or weightbearing through the heel.  PT evaluation today. -Lovenox for DVT prophylaxis.  Pain control as needed. - Follow cultures and tailor antibiotics.  He is on the antibiotics through hospitalist team currently.  Greatly appreciate assistance in management.   We will likely keep the wound VAC in place until at least next week.  We will follow for physical therapy recommendations with regard to disposition mentations.  He will likely  require long-term antibiotic therapy.  We did discuss today that he has a limb threatening situation and he voiced understanding.  No plan for any further surgical intervention at this time.  Bernie Ransford J. Camree Wigington, PA-C

## 2024-06-29 NOTE — Evaluation (Signed)
 Occupational Therapy Evaluation Patient Details Name: Johnny Andrews MRN: [id number] DOB: 08/15/1961 Today's Date: 06/29/2024   History of Present Illness   Johnny Andrews is a 63 y.o. male who presented to [facility]28[facility]24 with left heel stage IV calcaneal decubitus ulcer for I&D with wound vac application. PMHx: morbid obesity, OSA, T2DM, HTN, HLD, alcohol use, neuropathy, diabetic foot ulcer, anxiety, hypothyroidism, and OA.     Clinical Impressions PTA, pt lives with spouse, typically Modified Independent with ADLs, meal prep and mobility with recent use of cane to avoid excessive WB through LLE. Pt reports being sedentary at baseline and only able to walk short distances before fatiguing. Pt presents now close to this reported baseline with overall CGA for in-room mobility using RW and Min A for LB ADLs. Pt with 2/4 DOE with activity, SpO2 88% on RA increasing to 93% with seated rest break. Pt denies concerns managing at home; encouraged consideration of RW to maximize ability to maintain PWB LLE recommendations. No OT needs upon DC but will follow if pt remains admitted.      If plan is discharge home, recommend the following:   Assistance with cooking/housework;Other (comment) (PRN)     Functional Status Assessment   Patient has had a recent decline in their functional status and demonstrates the ability to make significant improvements in function in a reasonable and predictable amount of time.     Equipment Recommendations   Other (comment) (Bariatric RW)     Recommendations for Other Services         Precautions/Restrictions   Precautions Precautions: Fall;Other (comment) Precaution/Restrictions Comments: L foot wound vac Restrictions Weight Bearing Restrictions Per Provider Order: Yes LLE Weight Bearing Per Provider Order: Partial weight bearing LLE Partial Weight Bearing Percentage or Pounds: WB through forefoot     Mobility Bed Mobility Overal bed  mobility: Needs Assistance Bed Mobility: Supine to Sit     Supine to sit: Supervision, HOB elevated, Used rails          Transfers Overall transfer level: Needs assistance Equipment used: Rolling walker (2 wheels) Transfers: Sit to/from Stand Sit to Stand: Contact guard assist           General transfer comment: able to stand from slightly elevated bed using RW on two occasions during session      Balance Overall balance assessment: Needs assistance Sitting-balance support: Feet supported, No upper extremity supported Sitting balance-Leahy Scale: Fair     Standing balance support: Bilateral upper extremity supported, During functional activity Standing balance-Leahy Scale: Poor                             ADL either performed or assessed with clinical judgement   ADL Overall ADL's : Needs assistance/impaired Eating/Feeding: Independent   Grooming: Set up;Sitting   Upper Body Bathing: Set up;Sitting   Lower Body Bathing: Sit to/from stand;Sitting/lateral leans;Minimal assistance   Upper Body Dressing : Set up;Sitting   Lower Body Dressing: Minimal assistance;Sitting/lateral leans;Sit to/from stand Lower Body Dressing Details (indicate cue type and reason): assist for L sock on forefoot but does not wear socks at baseline Toilet Transfer: Contact guard assist;Ambulation;Rolling walker (2 wheels)   Toileting- Clothing Manipulation and Hygiene: Minimal assistance;Sitting/lateral lean;Sit to/from stand Toileting - Clothing Manipulation Details (indicate cue type and reason): uses bidet at home     Functional mobility during ADLs: Contact guard assist;Rolling walker (2 wheels);+2 for safety/equipment (initial chair follow)  Vision Baseline Vision/History: 1 Wears glasses Ability to See in Adequate Light: 0 Adequate Patient Visual Report: No change from baseline Vision Assessment?: No apparent visual deficits;Wears glasses for reading      Perception         Praxis         Pertinent Vitals/Pain Pain Assessment Pain Assessment: No/denies pain     Extremity/Trunk Assessment Upper Extremity Assessment Upper Extremity Assessment: Overall WFL for tasks assessed;Right hand dominant   Lower Extremity Assessment Lower Extremity Assessment: Defer to PT evaluation   Cervical / Trunk Assessment Cervical / Trunk Assessment: Normal   Communication Communication Communication: No apparent difficulties   Cognition Arousal: Alert Behavior During Therapy: WFL for tasks assessed/performed Cognition: No apparent impairments                               Following commands: Intact       Cueing  General Comments   Cueing Techniques: Verbal cues  Pt with 2/4 DOE post activity, SpO2 88% on RA with increase to 93% within 1 min seated rest break. pt reports this happens at home   Exercises     Shoulder Instructions      Home Living Family/patient expects to be discharged to:: Private residence Living Arrangements: Spouse/significant other Available Help at Discharge: Family;Available PRN/intermittently Type of Home: House Home Access: Stairs to enter Entrance Stairs-Number of Steps: 1   Home Layout: One level     Bathroom Shower/Tub: Sponge bathes at baseline   [employer]: Standard     Home Equipment: Rollator (4 wheels);Cane - single point   Additional Comments: Wife works full time outside of the home      Prior Functioning/Environment Prior Level of Function : Independent/Modified Independent             Mobility Comments: reports trying to keep weight off of foot for about a month- using cane for mobility. reports mostly sedentary, only walking about 19ft here and there (to bathroom, to kitchen) and has a chair in living room where he sits while making food ADLs Comments: Reports use of bidet for toileting hygiene, doesnt wear socks but more slip on shoes. able to dress self,  sponge bathes at baseline    OT Problem List: Decreased activity tolerance;Impaired balance (sitting and/or standing);Decreased knowledge of precautions;Decreased knowledge of use of DME or AE   OT Treatment/Interventions: Self-care/ADL training;Therapeutic exercise;Energy conservation;DME and/or AE instruction;Therapeutic activities;Patient/family education;Balance training      OT Goals(Current goals can be found in the care plan section)   Acute Rehab OT Goals Patient Stated Goal: home soon OT Goal Formulation: With patient Time For Goal Achievement: 07/13/24 Potential to Achieve Goals: Good   OT Frequency:  Min 1X/week    Co-evaluation PT/OT/SLP Co-Evaluation/Treatment: Yes Reason for Co-Treatment: Other (comment);For patient/therapist safety;To address functional/ADL transfers (pt fatigue) PT goals addressed during session: Mobility/safety with mobility;Proper use of DME;Strengthening/ROM OT goals addressed during session: ADL's and self-care;Strengthening/ROM      AM-PAC OT 6 Clicks Daily Activity     Outcome Measure Help from another person eating meals?: None Help from another person taking care of personal grooming?: A Little Help from another person toileting, which includes using toliet, bedpan, or urinal?: A Little Help from another person bathing (including washing, rinsing, drying)?: A Little Help from another person to put on and taking off regular upper body clothing?: A Little Help from another person to put on  and taking off regular lower body clothing?: A Little 6 Click Score: 19   End of Session Equipment Utilized During Treatment: Rolling walker (2 wheels) Nurse Communication: Mobility status  Activity Tolerance: Patient tolerated treatment well Patient left: in chair;with call bell/phone within reach  OT Visit Diagnosis: Other abnormalities of gait and mobility (R26.89)                Time: 8861-8792 OT Time Calculation (min): 29 min Charges:  OT  General Charges $OT Visit: 1 Visit OT Evaluation $OT Eval Low Complexity: 1 Low  Mliss NOVAK, OTR/L Acute Rehab Services Office: [phone]   Mliss Fish 06/29/2024, 1:01 PM

## 2024-06-29 NOTE — Anesthesia Postprocedure Evaluation (Signed)
 Anesthesia Post Note  Patient: Johnny Andrews  Procedure(s) Performed: IRRIGATION AND DEBRIDEMENT WOUND (Left) APPLICATION, WOUND VAC (Left)     Patient location during evaluation: PACU Anesthesia Type: General Level of consciousness: awake and alert Pain management: pain level controlled Vital Signs Assessment: post-procedure vital signs reviewed and stable Respiratory status: spontaneous breathing, nonlabored ventilation, respiratory function stable and patient connected to nasal cannula oxygen Cardiovascular status: blood pressure returned to baseline and stable Postop Assessment: no apparent nausea or vomiting Anesthetic complications: no   No notable events documented.  Last Vitals:  Vitals:   06/29/24 0440 06/29/24 0804  BP: (!) 158/71 (!) 149/81  Pulse: 74 80  Resp: 18 18  Temp: 36.7 C 36.9 C  SpO2: 93% 93%    Last Pain:  Vitals:   06/29/24 0633  TempSrc:   PainSc: 3                  Anand Tejada S

## 2024-06-29 NOTE — Progress Notes (Signed)
 Pharmacy Antibiotic Note  Johnny Andrews is a 63 y.o. male admitted on 06/28/2024 with L calcaneousosteo s/p I&D.  Pharmacy has been consulted for vancomycin/Zosyn dosing.  SCr up to 1.25, UOP not charted accurately. WBC up 15 but had dexamethasone  on 11/13, afebrile, and cultures are pending.   Plan: Decrease vancomycin 1500 mg IV q12h - eAUC 466.7, SCr 1.25, Vd 0.5 Continue Zosyn 3.375g IV q8h (4 hour infusion) Monitor renal function, clinical progress, cultures/sensitivities F/U LOT and de-escalate as able Vancomycin levels as clinically indicated   Height: 6' 3 (190.5 cm) Weight: (!) 199.6 kg (440 lb) IBW/kg (Calculated) : 84.5  Temp (24hrs), Avg:98.3 F (36.8 C), Min:98 F (36.7 C), Max:98.8 F (37.1 C)  Recent Labs  Lab 06/28/24 1310 06/29/24 0243  WBC 11.7* 15.0*  CREATININE 1.12 1.25*    Estimated Creatinine Clearance: 111.7 mL/min (A) (by C-G formula based on SCr of 1.25 mg/dL (H)).    Allergies  Allergen Reactions   Other     Dust mites   Vanco 11/13 >> Zosyn 11/13 >> Cefazolin 11/13 >> 11/14  11/13 L calcaneous bone: gram stain mod GPC pairs, few GNR, rare GPR - reinc 11/13 L heel tissue: gram stain rare GPC pairs - reinc   Thank you for involving pharmacy in this patient's care.  Delon Sax, PharmD, BCPS Clinical Pharmacist Clinical phone for 06/29/2024 is [phone] 06/29/2024 9:54 AM

## 2024-06-29 NOTE — Progress Notes (Signed)
 OT Cancellation Note  Patient Details Name: Johnny Andrews MRN: [id number] DOB: [birth year]   Cancelled Treatment:    Reason Eval/Treat Not Completed: Patient declined, no reason specified Pt declined OT eval citing trying to get some sleep. Will follow up as schedule permits.  Mliss Fish 06/29/2024, 8:27 AM

## 2024-06-29 NOTE — Evaluation (Signed)
 Physical Therapy Evaluation Patient Details Name: Johnny Andrews MRN: [id number] DOB: [birth year] Today's Date: 06/29/2024  History of Present Illness  Johnny Andrews is a 63 y.o. 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 PMHx: morbid obesity, OSA, T2DM, HTN, HLD, alcohol use, neuropathy, diabetic foot ulcer, anxiety, hypothyroidism, and OA.   Clinical Impression  Pt admitted with above diagnosis. PTA, pt was modI for functional mobility and ADLs. He reports a sedentary lifestyle. Pt has been trying to keep weight off his left foot for ~1 month and will ambulated a maximum of ~38ft using SPC vs. furniture walking. He also reports scooting around in a computer chair with wheels. Pt lives with his wife in a one story house with 1 STE. Pt currently with functional limitations due to the deficits listed below (see PT Problem List). He performed supine>sit with supervision and required CGA for sit<>stand using RW and CGA x2 for gait using RW. He is currently limited by weight bearing status (LLE PWB on forefoot), generalized weakness, impaired cardiopulmonary endurance, DOE 2/4, and decreased balance. Educated pt on proper and safe use of RW. Discussed how the RW is the appropriate AD to use during mobility attempts given his weight bearing restrictions and need to offload with BUE support. Pt will benefit from acute skilled PT to increase his independence and safety with mobility to allow discharge. Recommend HHPT to increase strength, improve balance, decrease fall risk, and optimize safety within the home environment.       If plan is discharge home, recommend the following: A little help with walking and/or transfers;A little help with bathing/dressing/bathroom;Assistance with cooking/housework;Assist for transportation;Help with stairs or ramp for entrance   Can travel by private vehicle        Equipment  Recommendations Rolling walker (2 wheels);BSC/3in1  Recommendations for Other Services       Functional Status Assessment Patient has had a recent decline in their functional status and demonstrates the ability to make significant improvements in function in a reasonable and predictable amount of time.     Precautions / Restrictions Precautions Precautions: Fall Recall of Precautions/Restrictions: Intact Precaution/Restrictions Comments: L foot Wound Vac Restrictions Weight Bearing Restrictions Per Provider Order: Yes LLE Weight Bearing Per Provider Order: Partial weight bearing LLE Partial Weight Bearing Percentage or Pounds: WB through forefoot only with a walker Other Position/Activity Restrictions: Avoid pressure on the heel, ensure heel is elevated at all times.      Mobility  Bed Mobility Overal bed mobility: Needs Assistance Bed Mobility: Supine to Sit     Supine to sit: Supervision, HOB elevated, Used rails     General bed mobility comments: Pt sat up on R side of bed with increased time. He reports his bed at home can elevate at the HOB/FOB. Height of bed raised to simulate home environment. Pt managed BLE and pushed up trunk. Sup for safety and assist to manage lines/leads.    Transfers Overall transfer level: Needs assistance Equipment used: Rolling walker (2 wheels) Transfers: Sit to/from Stand Sit to Stand: Contact guard assist           General transfer comment: Pt stood from slightly elevated bed height. Educated pt on proper and safe use of RW. Discussed need to use RW instead of rollator currently d/t weight bearing restrictions for improved stability/support. Cued proper hand/foot placement. Pt opted to power up with BUE support on RW and WBOS. Instructed  pt to limit weight acceptance on L foot to only forefoot. Good eccentric control.    Ambulation/Gait Ambulation/Gait assistance: Contact guard assist, +2 safety/equipment (Chair Follow) Gait Distance  (Feet): 8 Feet (x2, seated rest break) Assistive device: Rolling walker (2 wheels) Gait Pattern/deviations: Step-to pattern, Decreased stride length, Decreased stance time - left, Decreased weight shift to left, Wide base of support, Trunk flexed Gait velocity: decreased     General Gait Details: Pt ambulated with short slow steps. He maintained WBOS secondary to body habitus. Pt offloaded LLE through BUE support on RW only accepting weight in his L forefoot. Distance limited d/t weakness and fatigue. Pt declined to ambulate into the hallway despite chair follow available. He navigated room well. Cues for increased proximity to RW.  Stairs            Wheelchair Mobility     Tilt Bed    Modified Rankin (Stroke Patients Only)       Balance Overall balance assessment: Needs assistance Sitting-balance support: Feet supported, No upper extremity supported Sitting balance-Leahy Scale: Fair     Standing balance support: Bilateral upper extremity supported, During functional activity, Reliant on assistive device for balance Standing balance-Leahy Scale: Poor                               Pertinent Vitals/Pain Pain Assessment Pain Assessment: No/denies pain    Home Living Family/patient expects to be discharged to:: Private residence Living Arrangements: Spouse/significant other (Wife) Available Help at Discharge: Family;Available PRN/intermittently (Wife works full time outside of the home) Type of Home: House Home Access: Stairs to enter Entrance Stairs-Rails: None Secretary/administrator of Steps: 1   Home Layout: One level Home Equipment: Rollator (4 wheels);Cane - single point Additional Comments: Wife works full time outside of the home    Prior Function Prior Level of Function : Independent/Modified Independent;Driving             Mobility Comments: Pt reports trying to keep weight off his L foot for ~1 mo. He states he uses a cane and/or will  furniture walk. Pt reports he has been going a maximum of 79ft when ambulating, primarily just transfering, and has been using a computer chair with wheels to roll around. He has a mostly sedentary lifestyle and will stay hanging out in one space for the majority of the day. Denies fall hx. ADLs Comments: Reports use of bidet for toileting hygiene. Doesn't wear socks but more slip on shoes. Able to dress self, sponge bathes at baseline. Pt reports cooking in kitchen while sitting in chair.     Extremity/Trunk Assessment   Upper Extremity Assessment Upper Extremity Assessment: Defer to OT evaluation    Lower Extremity Assessment Lower Extremity Assessment: Generalized weakness;LLE deficits/detail LLE Deficits / Details: Pt with wound vac on L heel. He has an ace bandage wrapped around foot/ankle to mid-shin. Decreased ankle/foot AROM. Grossly 3/5 strength. LLE Sensation: history of peripheral neuropathy LLE Coordination: decreased gross motor    Cervical / Trunk Assessment Cervical / Trunk Assessment: Other exceptions Cervical / Trunk Exceptions: increased body habitus  Communication   Communication Communication: No apparent difficulties    Cognition Arousal: Alert Behavior During Therapy: WFL for tasks assessed/performed   PT - Cognitive impairments: No apparent impairments                       PT - Cognition Comments: Pt A,Ox4.  Following commands: Intact       Cueing Cueing Techniques: Verbal cues     General Comments General comments (skin integrity, edema, etc.): Pt with 2/4 DOE post activity, SpO2 88% on RA with increase to 93% within 1 min seated rest break.    Exercises     Assessment/Plan    PT Assessment Patient needs continued PT services  PT Problem List Cardiopulmonary status limiting activity;Decreased strength;Decreased activity tolerance;Decreased balance;Decreased mobility;Decreased knowledge of use of DME;Decreased safety awareness        PT Treatment Interventions DME instruction;Gait training;Functional mobility training;Therapeutic activities;Therapeutic exercise;Balance training;Patient/family education    PT Goals (Current goals can be found in the Care Plan section)  Acute Rehab PT Goals Patient Stated Goal: Return Home PT Goal Formulation: With patient Time For Goal Achievement: 07/13/24 Potential to Achieve Goals: Fair    Frequency Min 2X/week     Co-evaluation   Reason for Co-Treatment: Other (comment);For patient/therapist safety;To address functional/ADL transfers (pt fatigue) PT goals addressed during session: Mobility/safety with mobility;Proper use of DME;Strengthening/ROM OT goals addressed during session: ADL's and self-care;Strengthening/ROM       AM-PAC PT 6 Clicks Mobility  Outcome Measure Help needed turning from your back to your side while in a flat bed without using bedrails?: A Little Help needed moving from lying on your back to sitting on the side of a flat bed without using bedrails?: A Little Help needed moving to and from a bed to a chair (including a wheelchair)?: A Little Help needed standing up from a chair using your arms (e.g., wheelchair or bedside chair)?: A Little Help needed to walk in hospital room?: A Little Help needed climbing 3-5 steps with a railing? : A Lot 6 Click Score: 17    End of Session Equipment Utilized During Treatment: Gait belt Activity Tolerance: Patient tolerated treatment well;Patient limited by fatigue Patient left: in chair;with call bell/phone within reach Nurse Communication: Mobility status PT Visit Diagnosis: Difficulty in walking, not elsewhere classified (R26.2);Muscle weakness (generalized) (M62.81);Other abnormalities of gait and mobility (R26.89)    Time: 1140-1206 PT Time Calculation (min) (ACUTE ONLY): 26 min   Charges:   PT Evaluation $PT Eval Moderate Complexity: 1 Mod   PT General Charges $$ ACUTE PT VISIT: 1 Visit          Randall SAUNDERS, PT, DPT Acute Rehabilitation Services Office: [phone] Secure Chat Preferred  Delon CHRISTELLA Callander 06/29/2024, 1:23 PM

## 2024-06-30 DIAGNOSIS — M869 Osteomyelitis, unspecified: Secondary | ICD-10-CM | POA: Diagnosis not present

## 2024-06-30 LAB — BASIC METABOLIC PANEL WITH GFR
Anion gap: 10 (ref 5–15)
BUN: 24 mg/dL — ABNORMAL HIGH (ref 8–23)
CO2: 25 mmol/L (ref 22–32)
Calcium: 8.8 mg/dL — ABNORMAL LOW (ref 8.9–10.3)
Chloride: 101 mmol/L (ref 98–111)
Creatinine, Ser: 1.04 mg/dL (ref 0.61–1.24)
GFR, Estimated: >60 mL/min (ref >60)
Glucose, Bld: 149 mg/dL — ABNORMAL HIGH (ref 70–99)
Potassium: 4.1 mmol/L (ref 3.5–5.1)
Sodium: 136 mmol/L (ref 135–145)

## 2024-06-30 LAB — CBC WITH DIFFERENTIAL/PLATELET
Abs Immature Granulocytes: 0.06 K/uL (ref 0.00–0.07)
Basophils Absolute: 0.1 K/uL (ref 0.0–0.1)
Basophils Relative: 1 %
Eosinophils Absolute: 0.4 K/uL (ref 0.0–0.5)
Eosinophils Relative: 4 %
HCT: 39.6 % (ref 39.0–52.0)
Hemoglobin: 12.4 g/dL — ABNORMAL LOW (ref 13.0–17.0)
Immature Granulocytes: 1 %
Lymphocytes Relative: 22 %
Lymphs Abs: 2.5 K/uL (ref 0.7–4.0)
MCH: 27.9 pg (ref 26.0–34.0)
MCHC: 31.3 g/dL (ref 30.0–36.0)
MCV: 89.2 fL (ref 80.0–100.0)
Monocytes Absolute: 1.1 K/uL — ABNORMAL HIGH (ref 0.1–1.0)
Monocytes Relative: 10 %
Neutro Abs: 7.1 K/uL (ref 1.7–7.7)
Neutrophils Relative %: 62 %
Platelets: 338 K/uL (ref 150–400)
RBC: 4.44 MIL/uL (ref 4.22–5.81)
RDW: 14.2 % (ref 11.5–15.5)
WBC: 11.2 K/uL — ABNORMAL HIGH (ref 4.0–10.5)
nRBC: 0 % (ref 0.0–0.2)

## 2024-06-30 LAB — GLUCOSE, CAPILLARY
Glucose-Capillary: 138 mg/dL — ABNORMAL HIGH (ref 70–99)
Glucose-Capillary: 178 mg/dL — ABNORMAL HIGH (ref 70–99)
Glucose-Capillary: 234 mg/dL — ABNORMAL HIGH (ref 70–99)
Glucose-Capillary: 191 mg/dL — ABNORMAL HIGH (ref 70–99)

## 2024-06-30 MED ORDER — ENOXAPARIN SODIUM 80 MG/0.8ML IJ SOSY
0.5000 mg/kg | PREFILLED_SYRINGE | INTRAMUSCULAR | Status: DC
  Administered 2024-06-30 – 2024-07-01 (×2): 65 mg via SUBCUTANEOUS
  Filled 2024-06-30 (×2): qty 0.65

## 2024-06-30 MED ORDER — PANTOPRAZOLE SODIUM 40 MG PO TBEC
40.0000 mg | DELAYED_RELEASE_TABLET | Freq: Every day | ORAL | Status: DC
  Administered 2024-06-30 – 2024-07-03 (×4): 40 mg via ORAL
  Filled 2024-06-30 (×4): qty 1

## 2024-06-30 NOTE — Plan of Care (Signed)

## 2024-06-30 NOTE — Care Management Obs Status (Signed)
 MEDICARE OBSERVATION STATUS NOTIFICATION   Patient Details  Name: IVANN TRIMARCO MRN: [id number] Date of Birth: October 22, 1960   Medicare Observation Status Notification Given:  No (Patient not in room)    Robynn Eileen Hoose, RN 06/30/2024, 9:23 AM

## 2024-06-30 NOTE — Progress Notes (Signed)
 PHARMACY - Enoxaparin VTE prophylaxis dosage adjustment   Indication: VTE prophylaxis  Allergies  Allergen Reactions   Other     Dust mites    Patient Measurements: Height: 6' 3 (190.5 cm) Weight: (!) 199.6 kg (440 lb) IBW/kg (Calculated) : 84.5 HEPARIN DW (KG): 133.8  Vital Signs: Temp: 98.4 F (36.9 C) (11/15 0735) BP: 149/79 (11/15 0735) Pulse Rate: 67 (11/15 0735)  Labs: Recent Labs    06/28/24 1310 06/29/24 0243 06/30/24 0420  HGB 13.7 13.1 12.4*  HCT 43.4 40.7 39.6  PLT 330 328 338  CREATININE 1.12 1.25* 1.04    Estimated Creatinine Clearance: 134.2 mL/min (by C-G formula based on SCr of 1.04 mg/dL).   Medical History: Past Medical History:  Diagnosis Date   ALCOHOL USE 10/24/2008   ANXIETY 09/19/2007   Cough 05/22/2008   Diabetes mellitus without complication (HCC)    [employer]reesia 06/02/2020   DIABETES MELLITUS, TYPE II 09/19/2007   External thrombosed hemorrhoids 08/23/2008   GOUT 04/17/2010   HYPERLIPIDEMIA 09/19/2007   HYPERTENSION 09/19/2007   Hypertension    [employer]reesia 06/02/2020   HYPOTHYROIDISM 09/19/2007   Leg pain    OSTEOARTHRITIS 09/19/2007   OTHER DISEASES OF LUNG NOT ELSEWHERE CLASSIFIED 07/20/2010   Rash and other nonspecific skin eruption 09/19/2007   Restrictive lung disease    SCROTAL ABSCESS 05/27/2010   Sebaceous cyst 08/01/2008   SLEEP APNEA 08/22/2009   Sleep apnea    [employer]reesia 06/02/2020   SMOKER 04/17/2010   Thyroid  disease    [employer]reesia 06/02/2020   Assessment:  63 y.o. male, status post I&D of stage IV left calcaneal ulcer and calcaneal saucerization of left calcaneal osteomyelitis with application of wound VAC 06/28/2024.   Lovenox 40mg  SQ daily ordered  to start POD# 1 for VTE prophylaxis with pharmacy to adjust dose.   Patient is obese, weight 199.6 kg, BMI = 55.   VTE enoxaprin dosing recommended would be 0.5mg /kg = 100mg  q24h.    POD # 1 (11/14)  Hgb 13.1, pltc 328, and SCr up to 1.25.  Noted to have risk for AKI  w/Vandomhcin and Zosyn + naproxen Pharmacist on 11/4 discussed lovenox dosing with Dr Elsa  who is okay with enoxaparin 0.5 mg/kg  if Hgb stable post-op. Plan was to f/u labs on 11/15 an adjust dose if Hgb/plt remain stable post-op.   Today 11/15 POD #2, SCr 1.04,  Hgb dropped slightly to 12.4,  hct and pltc remains within normal. No bleeding reported. I will increase enoxaparin moderately today due to slight decrease in Hgb.  Will give 0.5mg /kg based on adjusted body weight 130.5kg   Goal of Therapy: VTE prophylaxis: 0.2-0.4 units/mL (not typically monitored)  Monitor platelets by anticoagulation protocol: Yes   Plan:  Increase Lovenox dose to 65mg  SQ q24h  (= 0.5 mg/kg x adjusted body weight 130.5 kg)  Will monitor CBC and Scr tomorrow 11/16.  If stable will consider increasing dose to 0.5mg /kg x total body weight of 199.6 kg.    Thank you for allowing pharmacy to be part of this patients care team.  Levorn Gaskins, RPh Clinical Pharmacist 06/30/2024,12:06 PM

## 2024-06-30 NOTE — Progress Notes (Signed)
 PROGRESS NOTE  Johnny Andrews  DOB: [birth year]  PCP: Purcell Emil Schanz, MD FMW:[id number]  DOA: 06/28/2024  LOS: 0 days  Hospital Day: 3  Subjective: Patient was seen and examined this a.m. Morbidly obese middle-aged male.  Was having a hard time getting up from bed to the recliner. Complains of acid reflux. Family not at bedside. Afebrile, hemodynamically stable, breathing on room air this morning Labs this morning with creatinine better at 1.04, WC count 11.2 Blood glucose trends showed consistently less than 200.  Brief narrative: Johnny Andrews is a 63 y.o. male with PMH significant for morbid obesity, OSA, DM2, HTN, HLD, alcohol use, Neuropathy, diabetic foot ulcer, hypothyroidism, osteoarthritis In the last 2 months, patient developed an ulceration in the left heel which progressively worsened and was associated with moderate to severe pain.  He was seen in Ortho clinic, was recommended to go to ED but patient was reluctant and hence brought in for an elective surgery today. He underwent I&D and application of wound VAC by Dr. Elsa. Postoperatively, hospitalist service was consulted for inpatient management.  Assessment/Plan: Diabetic foot ulcer Left calcaneal osteomyelitis S/p I&D and wound VAC placement -11/13 Dr. Elsa Currently on broad-spectrum coverage with IV Zosyn and vancomycin Wound culture is growing a mix of gram-positive and gram-negative bacteria.  Pending final determination..  Recent Labs  Lab 06/28/24 1310 06/29/24 0243 06/30/24 0420  WBC 11.7* 15.0* 11.2*   Postoperative respiratory insufficiency OSA Not on supplemental oxygen at home.  Uses nightly CPAP  Postop required  5 L oxygen postop.   Gradually weaned down.  Currently on room air Continue nocturnal CPAP  Type 2 diabetes mellitus Diabetic neuropathy A1c 7.3 on 06/28/2024 PTA meds-Ozempic weekly, Tresiba 80 units twice daily, NovoLog twice daily Jardiance 25 mg daily, metformin  1000 mg twice daily Currently on Semglee  40 units twice daily along with SSI/Accu-Cheks Blood sugar level consistently less than 200. Seems to be on Topamax  for neuropathy. Recent Labs  Lab 06/29/24 1212 06/29/24 1606 06/29/24 2107 06/30/24 0608 06/30/24 1148  GLUCAP 167* 190* 147* 138* 178*   Hypertension PTA meds- amlodipine 10 mg daily, lisinopril  40 mg daily, olmesartan 40 mg daily, HCTZ 25 mg daily Currently blood pressure is adequately continued on amlodipine 10 mg daily only.  Others on hold Continue IV hydralazine as needed.  Elevated creatinine Improving. Recent Labs    06/28/24 1310 06/29/24 0243 06/30/24 0420  BUN 25* 26* 24*  CREATININE 1.12 1.25* 1.04  CO2 24 23 25     HLD Continue Zocor , fenofibrate   Hypothyroidism Synthroid   Anxiety Trazodone  nightly, Xanax  as needed  GERD Start Protonix  Morbid Obesity  Body mass index is 55 kg/m. Patient has been advised to make an attempt to improve diet and exercise patterns to aid in weight loss.  Regular alcohol use Takes couple of drinks at night.  Last drink was the night before presentation.   Denies any withdrawal symptoms in the past.   Continue CIWA protocol   Mobility: Uses cane at baseline.  Needs PT eval.  Weightbearing restrictions per orthopedics PT Orders:   PT Follow up Rec: Home Health Pt (Pt Reports He Will Decline Follow-Up Therapy)06/29/2024 1300   Goals of care:   Code Status: Full Code    DVT prophylaxis:  SCDs Start: 06/28/24 1706   Antimicrobials: IV Zosyn and vancomycin Fluid: None Consultants: Orthopedics Family Communication: Not at bedside  Status: Observation Level of care:  Med-Surg   Patient is from: Home Anticipated d/c  to: Pending wound culture report, POD 2.  Diet:  Diet Order             Diet Carb Modified Fluid consistency: Thin; Room service appropriate? Yes  Diet effective now                   Scheduled Meds:  amLODipine  10 mg Oral Daily    docusate sodium  100 mg Oral BID   enoxaparin (LOVENOX) injection  0.5 mg/kg (Adjusted) Subcutaneous Q24H   folic acid  1 mg Oral Daily   insulin  aspart  0-5 Units Subcutaneous QHS   insulin  aspart  0-9 Units Subcutaneous TID WC   insulin  glargine-yfgn  40 Units Subcutaneous BID   levothyroxine   274 mcg Oral QAC breakfast   multivitamin with minerals  1 tablet Oral Daily   naproxen  250 mg Oral BID WC   pantoprazole  40 mg Oral QAC breakfast   simvastatin   20 mg Oral Daily   thiamine  100 mg Oral Daily   Or   thiamine  100 mg Intravenous Daily   topiramate   100 mg Oral QHS   traZODone   50 mg Oral QHS    PRN meds: acetaminophen , albuterol , ALPRAZolam , bisacodyl, diphenhydrAMINE, hydrALAZINE, HYDROcodone -acetaminophen , HYDROcodone -acetaminophen , HYDROmorphone (DILAUDID) injection, LORazepam **OR** LORazepam, methocarbamol **OR** methocarbamol (ROBAXIN) injection, metoCLOPramide **OR** metoCLOPramide (REGLAN) injection, morphine injection, ondansetron **OR** ondansetron (ZOFRAN) IV, polyethylene glycol   Infusions:   piperacillin-tazobactam (ZOSYN)  IV 3.375 g (06/30/24 1351)   vancomycin Stopped (06/30/24 0711)    Antimicrobials: Anti-infectives (From admission, onward)    Start     Dose/Rate Route Frequency Ordered Stop   06/29/24 1800  vancomycin (VANCOREADY) IVPB 1500 mg/300 mL        1,500 mg 150 mL/hr over 120 Minutes Intravenous Every 12 hours 06/29/24 0953     06/28/24 2200  ceFAZolin (ANCEF) IVPB 2g/100 mL premix  Status:  Discontinued        2 g 200 mL/hr over 30 Minutes Intravenous Every 8 hours 06/28/24 1603 06/28/24 1710   06/28/24 2000  ceFAZolin (ANCEF) IVPB 2g/100 mL premix        2 g 200 mL/hr over 30 Minutes Intravenous Every 6 hours 06/28/24 1730 06/29/24 0707   06/28/24 1800  ceFAZolin (ANCEF) IVPB 2g/100 mL premix  Status:  Discontinued        2 g 200 mL/hr over 30 Minutes Intravenous Every 6 hours 06/28/24 1705 06/28/24 1730   06/28/24 1800   piperacillin-tazobactam (ZOSYN) IVPB 3.375 g        3.375 g 12.5 mL/hr over 240 Minutes Intravenous Every 8 hours 06/28/24 1710     06/28/24 1800  vancomycin (VANCOREADY) IVPB 1750 mg/350 mL  Status:  Discontinued        1,750 mg 175 mL/hr over 120 Minutes Intravenous Every 12 hours 06/28/24 1710 06/29/24 0953   06/28/24 1429  vancomycin (VANCOCIN) powder  Status:  Discontinued          As needed 06/28/24 1430 06/28/24 1500   06/28/24 1230  ceFAZolin (ANCEF) IVPB 3g/150 mL premix        3 g 300 mL/hr over 30 Minutes Intravenous On call to O.R. 06/28/24 1133 06/28/24 1435       Objective: Vitals:   06/30/24 0433 06/30/24 0735  BP: 124/66 (!) 149/79  Pulse: 71 67  Resp: 13 17  Temp: 98.7 F (37.1 C) 98.4 F (36.9 C)  SpO2: 92% 93%    Intake/Output Summary (Last 24  hours) at 06/30/2024 1400 Last data filed at 06/30/2024 1157 Gross per 24 hour  Intake 1329.32 ml  Output 1250 ml  Net 79.32 ml   Filed Weights   06/28/24 1147  Weight: (!) 199.6 kg   Weight change:  Body mass index is 55 kg/m.   Physical Exam: General exam: Pleasant, middle-aged morbidly obese male.  Morbidly obese.  Pain controlled. Skin: No rashes, lesions or ulcers. HEENT: Atraumatic, normocephalic, no obvious bleeding Lungs: Clear to auscultation bilaterally, not on oxygen this morning CVS: S1, S2, no murmur,   GI/Abd: Soft, nontender, distended from obesity, bowel sound present,   CNS: Alert, awake, oriented x 3 Psychiatry: Mood appropriate Extremities: Postop dressing on on left foot with wound VAC on.  No pedal edema, no calf tenderness,   Data Review: I have personally reviewed the laboratory data and studies available.  F/u labs ordered Unresulted Labs (From admission, onward)     Start     Ordered   07/01/24 0500  CBC  Tomorrow morning,   R        06/30/24 1203   07/01/24 0500  Creatinine, serum  Tomorrow morning,   R        06/30/24 1203            Signed, Chapman Rota,  MD [facility] 06/30/2024

## 2024-06-30 NOTE — Progress Notes (Signed)
    Johnny Andrews is a 63 y.o. male    Orthopaedic diagnosis: Status post I&D of stage IV left calcaneal ulcer and calcaneal saucerization of left calcaneal osteomyelitis with application of wound VAC 06/28/2024   Subjective: Patient resting comfortably.  He denies significant pain.  Preliminary results of intraoperative cultures suggesting multiple organisms but no formal organism identification yet.  Hospital service has been consulted and he is on broad spectrum antibiotics.  Prior to admission, he resided at home with his wife.  Therapy evaluation pending.  BP (!) 149/79 (BP Location: Left Arm)   Pulse 67   Temp 98.4 F (36.9 C)   Resp 17   Ht 6' 3 (1.905 m)   Wt (!) 199.6 kg   SpO2 93%   BMI 55.00 kg/m     Exam: Awake and alert, reclining in bed comfortably Respirations even and unlabored No acute distress Patient is morbidly obese   Left foot and ankle with dressing and wound VAC in place.  Wound VAC with good suction and output.  Baseline swelling in the lower extremities.  No calf tenderness.  He can wiggle the toes.  He has diminished sensation to light touch in the exposed forefoot and toes which he reports is his baseline.  Toes feel warm and well-perfused distally.   Assessment: Post op day 2 status post the above   Plan: - Keep dressing and wound VAC in place.  Empty wound VAC as needed. - Avoid pressure on the heel, ensure heel is elevated at all times. - Partial weightbearing operative extremity through the forefoot only with a walker.  No pressure or weightbearing through the heel.  PT evaluation today. -Lovenox for DVT prophylaxis.  Pain control as needed. - Follow cultures and tailor antibiotics.  He is on the antibiotics through hospitalist team currently.  Greatly appreciate assistance in management.    We will likely keep the wound VAC in place until at least next week.  We will follow for physical therapy recommendations with regard to disposition  mentations.  He will likely require long-term antibiotic therapy.  We did discuss today that he has a limb threatening situation and he voiced understanding.  No plan for any further surgical intervention at this time.

## 2024-07-01 DIAGNOSIS — M869 Osteomyelitis, unspecified: Secondary | ICD-10-CM | POA: Diagnosis not present

## 2024-07-01 LAB — CBC
HCT: 39.8 % (ref 39.0–52.0)
Hemoglobin: 12.4 g/dL — ABNORMAL LOW (ref 13.0–17.0)
MCH: 27.8 pg (ref 26.0–34.0)
MCHC: 31.2 g/dL (ref 30.0–36.0)
MCV: 89.2 fL (ref 80.0–100.0)
Platelets: 323 K/uL (ref 150–400)
RBC: 4.46 MIL/uL (ref 4.22–5.81)
RDW: 14.2 % (ref 11.5–15.5)
WBC: 9.6 K/uL (ref 4.0–10.5)
nRBC: 0 % (ref 0.0–0.2)

## 2024-07-01 LAB — GLUCOSE, CAPILLARY
Glucose-Capillary: 132 mg/dL — ABNORMAL HIGH (ref 70–99)
Glucose-Capillary: 122 mg/dL — ABNORMAL HIGH (ref 70–99)
Glucose-Capillary: 203 mg/dL — ABNORMAL HIGH (ref 70–99)
Glucose-Capillary: 199 mg/dL — ABNORMAL HIGH (ref 70–99)
Glucose-Capillary: 187 mg/dL — ABNORMAL HIGH (ref 70–99)

## 2024-07-01 LAB — CREATININE, SERUM
Creatinine, Ser: 1.02 mg/dL (ref 0.61–1.24)
GFR, Estimated: >60 mL/min (ref >60)

## 2024-07-01 MED ORDER — ENOXAPARIN SODIUM 100 MG/ML IJ SOSY
0.5000 mg/kg | PREFILLED_SYRINGE | INTRAMUSCULAR | Status: DC
  Administered 2024-07-02 – 2024-07-03 (×2): 100 mg via SUBCUTANEOUS
  Filled 2024-07-01 (×2): qty 1

## 2024-07-01 MED ORDER — INSULIN GLARGINE-YFGN 100 UNIT/ML ~~LOC~~ SOLN
50.0000 [IU] | Freq: Two times a day (BID) | SUBCUTANEOUS | Status: DC
  Administered 2024-07-01 – 2024-07-02 (×4): 50 [IU] via SUBCUTANEOUS
  Filled 2024-07-01 (×6): qty 0.5

## 2024-07-01 MED ORDER — LISINOPRIL 20 MG PO TABS
40.0000 mg | ORAL_TABLET | Freq: Every day | ORAL | Status: DC
  Administered 2024-07-01: 40 mg via ORAL
  Filled 2024-07-01: qty 2

## 2024-07-01 NOTE — Plan of Care (Signed)

## 2024-07-01 NOTE — Progress Notes (Signed)
 PROGRESS NOTE  Johnny Andrews  DOB: 09-18-1960  PCP: Johnny Emil Schanz, MD FMW:[id number]  DOA: 06/28/2024  LOS: 0 days  Hospital Day: 4  Subjective: Patient was seen and examined this a.m. Sitting up in recliner.  Taking his breakfast.  Feels much better after good sleep last night. Pain control is adequate. Afebrile, hemodynamically stable, breathing on room air. Pending culture report  Brief narrative: Johnny Andrews is a 63 y.o. male with PMH significant for morbid obesity, OSA, DM2, HTN, HLD, alcohol use, Neuropathy, diabetic foot ulcer, hypothyroidism, osteoarthritis In the last 2 months, patient developed an ulceration in the left heel which progressively worsened and was associated with moderate to severe pain.  He was seen in Ortho clinic, was recommended to go to ED but patient was reluctant and hence brought in for an elective surgery today. He underwent I&D and application of wound VAC by Dr. Elsa. Postoperatively, hospitalist service was consulted for inpatient management.  Assessment/Plan: Diabetic foot ulcer Left calcaneal osteomyelitis S/p I&D and wound VAC placement -11/13 Dr. Elsa Wound culture is growing a mix of gram-positive and gram-negative bacteria.  Pending final determination..  Currently on broad-spectrum coverage with IV Zosyn and vancomycin Recent Labs  Lab 06/28/24 1310 06/29/24 0243 06/30/24 0420 07/01/24 0715  WBC 11.7* 15.0* 11.2* 9.6   OSA Nightly CPAP  Type 2 diabetes mellitus Diabetic neuropathy A1c 7.3 on 06/28/2024 PTA meds-Ozempic weekly, Tresiba 80 units twice daily, NovoLog twice daily Jardiance 25 mg daily, metformin 1000 mg twice daily Currently on Semglee  40 units twice daily along with SSI/Accu-Cheks Blood sugar level improving but is still elevated.  Increase Semglee  to 50 units twice daily today Seems to be on Topamax  for neuropathy. Recent Labs  Lab 06/30/24 1148 06/30/24 1626 06/30/24 2059 07/01/24 0627  07/01/24 0807  GLUCAP 178* 234* 191* 132* 122*   Hypertension PTA meds- amlodipine 10 mg daily, lisinopril  40 mg daily, olmesartan 40 mg daily, HCTZ 25 mg daily Currently on amlodipine 10 mg daily only.  Blood pressure in 140s mostly.  I will restart lisinopril  40 mg daily today.   Continue IV hydralazine as needed.  Elevated creatinine Improving. Recent Labs    06/28/24 1310 06/29/24 0243 06/30/24 0420 07/01/24 0715  BUN 25* 26* 24*  --   CREATININE 1.12 1.25* 1.04 1.02  CO2 24 23 25   --     HLD Continue Zocor , fenofibrate   Hypothyroidism Synthroid   Anxiety Trazodone  nightly, Xanax  as needed  GERD Start Protonix  Morbid Obesity  Body mass index is 55 kg/m. Patient has been advised to make an attempt to improve diet and exercise patterns to aid in weight loss.  Regular alcohol use Takes couple of drinks at night.  Last drink was the night before presentation.   Denies any withdrawal symptoms in the past.   Continue CIWA protocol  Impaired mobility Uses cane at home. Mobility restricted because of surgery and wound VAC Seen by PT.  Home health PT recommended. Per Ortho recommendation - Avoid pressure on the heel, ensure heel is elevated at all times. - Partial weightbearing operative extremity through the forefoot only with a walker.  No pressure or weightbearing through the heel.   PT Follow up Rec: Home Health Pt (Pt Reports He Will Decline Follow-Up Therapy)06/29/2024 1300   Goals of care:   Code Status: Full Code    DVT prophylaxis:  SCDs Start: 06/28/24 1706   Antimicrobials: IV Zosyn and vancomycin Fluid: None Consultants: Orthopedics Family Communication: Not at  bedside  Status: Observation Level of care:  Med-Surg   Patient is from: Home Anticipated d/c to: Pending wound culture report, POD 3.  Diet:  Diet Order             Diet Carb Modified Fluid consistency: Thin; Room service appropriate? Yes  Diet effective now                    Scheduled Meds:  amLODipine  10 mg Oral Daily   docusate sodium  100 mg Oral BID   enoxaparin (LOVENOX) injection  0.5 mg/kg (Adjusted) Subcutaneous Q24H   folic acid  1 mg Oral Daily   insulin  aspart  0-5 Units Subcutaneous QHS   insulin  aspart  0-9 Units Subcutaneous TID WC   insulin  glargine-yfgn  50 Units Subcutaneous BID   levothyroxine   274 mcg Oral QAC breakfast   lisinopril   40 mg Oral Daily   multivitamin with minerals  1 tablet Oral Daily   naproxen  250 mg Oral BID WC   pantoprazole  40 mg Oral QAC breakfast   simvastatin   20 mg Oral Daily   thiamine  100 mg Oral Daily   Or   thiamine  100 mg Intravenous Daily   topiramate   100 mg Oral QHS   traZODone   50 mg Oral QHS    PRN meds: acetaminophen , albuterol , ALPRAZolam , bisacodyl, diphenhydrAMINE, hydrALAZINE, HYDROcodone -acetaminophen , HYDROcodone -acetaminophen , HYDROmorphone (DILAUDID) injection, LORazepam **OR** LORazepam, methocarbamol **OR** methocarbamol (ROBAXIN) injection, metoCLOPramide **OR** metoCLOPramide (REGLAN) injection, morphine injection, ondansetron **OR** ondansetron (ZOFRAN) IV, polyethylene glycol   Infusions:   piperacillin-tazobactam (ZOSYN)  IV 12.5 mL/hr at 07/01/24 0614   vancomycin 150 mL/hr at 07/01/24 9385    Antimicrobials: Anti-infectives (From admission, onward)    Start     Dose/Rate Route Frequency Ordered Stop   06/29/24 1800  vancomycin (VANCOREADY) IVPB 1500 mg/300 mL        1,500 mg 150 mL/hr over 120 Minutes Intravenous Every 12 hours 06/29/24 0953     06/28/24 2200  ceFAZolin (ANCEF) IVPB 2g/100 mL premix  Status:  Discontinued        2 g 200 mL/hr over 30 Minutes Intravenous Every 8 hours 06/28/24 1603 06/28/24 1710   06/28/24 2000  ceFAZolin (ANCEF) IVPB 2g/100 mL premix        2 g 200 mL/hr over 30 Minutes Intravenous Every 6 hours 06/28/24 1730 06/29/24 0707   06/28/24 1800  ceFAZolin (ANCEF) IVPB 2g/100 mL premix  Status:  Discontinued        2 g 200 mL/hr over 30  Minutes Intravenous Every 6 hours 06/28/24 1705 06/28/24 1730   06/28/24 1800  piperacillin-tazobactam (ZOSYN) IVPB 3.375 g        3.375 g 12.5 mL/hr over 240 Minutes Intravenous Every 8 hours 06/28/24 1710     06/28/24 1800  vancomycin (VANCOREADY) IVPB 1750 mg/350 mL  Status:  Discontinued        1,750 mg 175 mL/hr over 120 Minutes Intravenous Every 12 hours 06/28/24 1710 06/29/24 0953   06/28/24 1429  vancomycin (VANCOCIN) powder  Status:  Discontinued          As needed 06/28/24 1430 06/28/24 1500   06/28/24 1230  ceFAZolin (ANCEF) IVPB 3g/150 mL premix        3 g 300 mL/hr over 30 Minutes Intravenous On call to O.R. 06/28/24 1133 06/28/24 1435       Objective: Vitals:   07/01/24 0306 07/01/24 0808  BP: 129/77 (!) 167/77  Pulse: 70 69  Resp: 16 20  Temp: 97.6 F (36.4 C) 98.4 F (36.9 C)  SpO2: 95% 95%    Intake/Output Summary (Last 24 hours) at 07/01/2024 1139 Last data filed at 07/01/2024 0930 Gross per 24 hour  Intake 762.72 ml  Output 2025 ml  Net -1262.28 ml   Filed Weights   06/28/24 1147  Weight: (!) 199.6 kg   Weight change:  Body mass index is 55 kg/m.   Physical Exam: General exam: Pleasant, middle-aged morbidly obese male.  Morbidly obese.  Feels better with pain control. Skin: No rashes, lesions or ulcers. HEENT: Atraumatic, normocephalic, no obvious bleeding Lungs: Clear to auscultation bilaterally, not on oxygen this morning CVS: S1, S2, no murmur,   GI/Abd: Soft, nontender, distended from obesity, bowel sound present,   CNS: Alert, awake, oriented x 3 Psychiatry: Mood better today Extremities: Postop dressing on on left foot with wound VAC on.  No pedal edema, no calf tenderness,   Data Review: I have personally reviewed the laboratory data and studies available.  F/u labs ordered Unresulted Labs (From admission, onward)     Start     Ordered   07/02/24 0500  CBC with Differential/Platelet  Tomorrow morning,   R        07/01/24 0728    07/02/24 0500  Basic metabolic panel with GFR  Tomorrow morning,   R        07/01/24 0728            Signed, Chapman Rota, MD [facility] 07/01/2024

## 2024-07-01 NOTE — Progress Notes (Signed)
 Patient is sitting comfortably in his recliner, status post his left foot surgery.  His wound VAC is in place.  His pain is well-controlled.  Plan for now is to continue with his wound VAC, and avoid pressure on his left heel.  Patient will continue with Lovenox.  Cultures will continue to be followed.  For now, he will continue current antibiotic regimen.

## 2024-07-02 DIAGNOSIS — E785 Hyperlipidemia, unspecified: Secondary | ICD-10-CM | POA: Diagnosis present

## 2024-07-02 DIAGNOSIS — E1169 Type 2 diabetes mellitus with other specified complication: Secondary | ICD-10-CM | POA: Diagnosis present

## 2024-07-02 DIAGNOSIS — K219 Gastro-esophageal reflux disease without esophagitis: Secondary | ICD-10-CM | POA: Diagnosis present

## 2024-07-02 DIAGNOSIS — M86172 Other acute osteomyelitis, left ankle and foot: Secondary | ICD-10-CM | POA: Diagnosis present

## 2024-07-02 DIAGNOSIS — Z7984 Long term (current) use of oral hypoglycemic drugs: Secondary | ICD-10-CM | POA: Diagnosis not present

## 2024-07-02 DIAGNOSIS — L97429 Non-pressure chronic ulcer of left heel and midfoot with unspecified severity: Secondary | ICD-10-CM | POA: Diagnosis present

## 2024-07-02 DIAGNOSIS — M869 Osteomyelitis, unspecified: Secondary | ICD-10-CM | POA: Diagnosis not present

## 2024-07-02 DIAGNOSIS — E11621 Type 2 diabetes mellitus with foot ulcer: Secondary | ICD-10-CM | POA: Diagnosis present

## 2024-07-02 DIAGNOSIS — E1152 Type 2 diabetes mellitus with diabetic peripheral angiopathy with gangrene: Secondary | ICD-10-CM | POA: Diagnosis present

## 2024-07-02 DIAGNOSIS — Z6841 Body Mass Index (BMI) 40.0 and over, adult: Secondary | ICD-10-CM | POA: Diagnosis not present

## 2024-07-02 DIAGNOSIS — D72829 Elevated white blood cell count, unspecified: Secondary | ICD-10-CM | POA: Diagnosis present

## 2024-07-02 DIAGNOSIS — B964 Proteus (mirabilis) (morganii) as the cause of diseases classified elsewhere: Secondary | ICD-10-CM | POA: Diagnosis present

## 2024-07-02 DIAGNOSIS — E039 Hypothyroidism, unspecified: Secondary | ICD-10-CM | POA: Diagnosis present

## 2024-07-02 DIAGNOSIS — G4733 Obstructive sleep apnea (adult) (pediatric): Secondary | ICD-10-CM | POA: Diagnosis present

## 2024-07-02 DIAGNOSIS — Z794 Long term (current) use of insulin: Secondary | ICD-10-CM | POA: Diagnosis not present

## 2024-07-02 DIAGNOSIS — E1140 Type 2 diabetes mellitus with diabetic neuropathy, unspecified: Secondary | ICD-10-CM | POA: Diagnosis present

## 2024-07-02 DIAGNOSIS — I10 Essential (primary) hypertension: Secondary | ICD-10-CM | POA: Diagnosis present

## 2024-07-02 DIAGNOSIS — Z7985 Long-term (current) use of injectable non-insulin antidiabetic drugs: Secondary | ICD-10-CM | POA: Diagnosis not present

## 2024-07-02 LAB — AEROBIC/ANAEROBIC CULTURE W GRAM STAIN (SURGICAL/DEEP WOUND): Gram Stain: NONE SEEN

## 2024-07-02 LAB — CBC WITH DIFFERENTIAL/PLATELET
Abs Immature Granulocytes: 0.06 K/uL (ref 0.00–0.07)
Basophils Absolute: 0.1 K/uL (ref 0.0–0.1)
Basophils Relative: 1 %
Eosinophils Absolute: 0.6 K/uL — ABNORMAL HIGH (ref 0.0–0.5)
Eosinophils Relative: 5 %
HCT: 38.5 % — ABNORMAL LOW (ref 39.0–52.0)
Hemoglobin: 12.1 g/dL — ABNORMAL LOW (ref 13.0–17.0)
Immature Granulocytes: 1 %
Lymphocytes Relative: 19 %
Lymphs Abs: 2.3 K/uL (ref 0.7–4.0)
MCH: 28.1 pg (ref 26.0–34.0)
MCHC: 31.4 g/dL (ref 30.0–36.0)
MCV: 89.3 fL (ref 80.0–100.0)
Monocytes Absolute: 1.1 K/uL — ABNORMAL HIGH (ref 0.1–1.0)
Monocytes Relative: 9 %
Neutro Abs: 7.9 K/uL — ABNORMAL HIGH (ref 1.7–7.7)
Neutrophils Relative %: 65 %
Platelets: 307 K/uL (ref 150–400)
RBC: 4.31 MIL/uL (ref 4.22–5.81)
RDW: 14.1 % (ref 11.5–15.5)
WBC: 12 K/uL — ABNORMAL HIGH (ref 4.0–10.5)
nRBC: 0 % (ref 0.0–0.2)

## 2024-07-02 LAB — BASIC METABOLIC PANEL WITH GFR
Anion gap: 7 (ref 5–15)
BUN: 21 mg/dL (ref 8–23)
CO2: 24 mmol/L (ref 22–32)
Calcium: 8.5 mg/dL — ABNORMAL LOW (ref 8.9–10.3)
Chloride: 103 mmol/L (ref 98–111)
Creatinine, Ser: 1.09 mg/dL (ref 0.61–1.24)
GFR, Estimated: >60 mL/min (ref >60)
Glucose, Bld: 189 mg/dL — ABNORMAL HIGH (ref 70–99)
Potassium: 3.8 mmol/L (ref 3.5–5.1)
Sodium: 134 mmol/L — ABNORMAL LOW (ref 135–145)

## 2024-07-02 LAB — GLUCOSE, CAPILLARY
Glucose-Capillary: 155 mg/dL — ABNORMAL HIGH (ref 70–99)
Glucose-Capillary: 246 mg/dL — ABNORMAL HIGH (ref 70–99)
Glucose-Capillary: 231 mg/dL — ABNORMAL HIGH (ref 70–99)
Glucose-Capillary: 164 mg/dL — ABNORMAL HIGH (ref 70–99)

## 2024-07-02 MED ORDER — LEVOFLOXACIN 750 MG PO TABS
750.0000 mg | ORAL_TABLET | Freq: Every day | ORAL | Status: DC
  Administered 2024-07-02 – 2024-07-03 (×2): 750 mg via ORAL
  Filled 2024-07-02 (×2): qty 1

## 2024-07-02 MED ORDER — METRONIDAZOLE 500 MG PO TABS
500.0000 mg | ORAL_TABLET | Freq: Two times a day (BID) | ORAL | Status: DC
  Administered 2024-07-02 – 2024-07-03 (×3): 500 mg via ORAL
  Filled 2024-07-02 (×3): qty 1

## 2024-07-02 MED ORDER — HYDROCHLOROTHIAZIDE 25 MG PO TABS
25.0000 mg | ORAL_TABLET | Freq: Every day | ORAL | Status: DC
  Administered 2024-07-02 – 2024-07-03 (×2): 25 mg via ORAL
  Filled 2024-07-02 (×2): qty 1

## 2024-07-02 MED ORDER — OLMESARTAN MEDOXOMIL-HCTZ 40-25 MG PO TABS: 1.0000 | ORAL_TABLET | Freq: Every day | ORAL | Status: DC

## 2024-07-02 MED ORDER — IRBESARTAN 300 MG PO TABS
300.0000 mg | ORAL_TABLET | Freq: Every day | ORAL | Status: DC
  Administered 2024-07-02 – 2024-07-03 (×2): 300 mg via ORAL
  Filled 2024-07-02 (×2): qty 1

## 2024-07-02 NOTE — Op Note (Signed)
 Johnny Andrews male 63 y.o. 06/28/2024  PreOperative Diagnosis: Left posterolateral heel ulceration, 4 cm x 4 cm x 0.5 cm Acute calcaneal osteomyelitis  PostOperative Diagnosis: Same  PROCEDURE: Debridement of left heel ulceration 4 cm x 4 cm x 0.05 cm Calcaneal saucerization for osteomyelitis Wound closure intermediate, 7 cm  SURGEON: Lonni Pae, MD  ASSISTANT: Jesse Jordan, PA-C was necessary for patient positioning, prep, drape and assistance with treatment of the ulceration  ANESTHESIA: General  FINDINGS: See below  IMPLANTS: None  INDICATIONS:63 y.o. male presented to the office with a heel ulcer on the posterior lateral aspect of his heel that was a pressure type ulcer with underlying diabetes.  It was stage IV with necrotic tissue down to the calcaneus.  Subsequent MRI scan was obtained which demonstrated some acute osteomyelitis of the calcaneus below the area of the ulcer.  He was indicated for surgery due to the condition.   Patient understood the risks, benefits and alternatives to surgery which include but are not limited to wound healing complications, infection, nonunion, malunion, need for further surgery as well as damage to surrounding structures. They also understood the potential for continued pain in that there were no guarantees of acceptable outcome After weighing these risks the patient opted to proceed with surgery.  PROCEDURE: Patient was identified in the preoperative holding area.  The left leg was marked by myself.  Consent was signed by myself and the patient.  Block was performed by anesthesia in the preoperative holding area.  Patient was taken to the operative suite and placed supine on the operative table.  General anesthesia was induced without difficulty. Bump was placed under the operative hip and bone foam was used.  Given the patient's body habitus we had to strap the leg to a cross beam in order to gain access to the lateral aspect of  the calcaneus.  All bony prominences were well padded.  Preoperative antibiotics were given. The extremity was prepped and draped in the usual sterile fashion and surgical timeout was performed.    We began with ulcer debridement.  Tissue was sent for culture.  Debridement type: Excisional Debridement  Side: left  Body Location: Foot   Tools used for debridement: scalpel, scissors, curette, and rongeur  Pre-debridement Wound size (cm):   Length: 4 cm        width: 4 cm     depth: 0.5 cm  Post-debridement Wound size (cm):   Length: 5 cm        width: 5 cm     depth: 4 cm  Debridement depth beyond dead/damaged tissue down to healthy viable tissue: yes  Tissue layer involved: skin, subcutaneous tissue, muscle / fascia, bone  Nature of tissue removed: Slough, Necrotic, Devitalized Tissue, Non-viable tissue, and Purulence  Irrigation volume: 3000 cc     Irrigation fluid type: Normal Saline  After debridement was performed there was healthy bleeding skin edges.  We then proceeded with calcaneal saucerization.  Using an osteotome the lateral wall of the calcaneus including the lateral calcaneal tuberosity was removed.  Then a rondure was used to remove any spikes of bone and any devitalized bone in this area.  The exposed cancellous bony surfaces were then removed and sent as a separate culture.  The remaining portion of the lateral aspect of the calcaneus and calcaneal tuberosity did not appear infected.  There was some purulent discharge from the area of saucerization.  After acceptable debridement and saucerization of the calcaneus the area  was reirrigated.  Then vancomycin powder was placed within the wound bed.  Given the significant amount of soft tissue and bone loss in the area of the ulceration we were able to primarily close the ulceration relatively well.  Using a 2-0 nylon suture the wound was closed in loose fashion to allow for drainage.  This was an intermediate type wound  closure due to the tension on the wound and the need for tension type suture technique.  Then there was some open portion of the wound remaining after closure due to the skin loss but it was well-approximated elsewhere.  We then proceeded to place a wound VAC.  A wound VAC was placed overlying the remaining open portions of the ulceration.  It was also placed overlying the close portions of the ulceration.  After placement of the VAC there was good seal.  Continuous pressure was set.  Then an Ace wrap was placed overlying the wound VAC.  The soft tissue material and bony material was sent separately for culture.  The patient was then awakened from anesthesia and taken recovery in stable condition.    POST OPERATIVE INSTRUCTIONS: Forefoot weightbearing only through the left lower extremity Keep VAC in place and plugged then Will follow-up on cultures Vanco and Zosyn postoperatively until cultures result. Medicine consult for medical management Likely ID consult once cultures result.  TOURNIQUET TIME: No tourniquet  BLOOD LOSS:  less than 50 mL         DRAINS: none         SPECIMEN: none       COMPLICATIONS:  * No complications entered in OR log *         Disposition: PACU - hemodynamically stable.         Condition: stable

## 2024-07-02 NOTE — Progress Notes (Signed)
 PROGRESS NOTE  Johnny Andrews  DOB: Aug 22, 1960  PCP: Purcell Emil Schanz, MD FMW:[id number]  DOA: 06/28/2024  LOS: 0 days  Hospital Day: 5  Subjective: Patient was seen and examined this a.m. Sitting up in recliner.  Feels better. Afebrile, hemodynamically stable, breathing room air Labs today with WC count 12, sodium 134, glucose level better controlled in the last 24 hours. Pending culture report  Brief narrative: Johnny Andrews is a 63 y.o. male with PMH significant for morbid obesity, OSA, DM2, HTN, HLD, alcohol use, Neuropathy, diabetic foot ulcer, hypothyroidism, osteoarthritis In the last 2 months, patient developed an ulceration in the left heel which progressively worsened and was associated with moderate to severe pain.  He was seen in Ortho clinic, was recommended to go to ED but patient was reluctant and hence brought in for an elective surgery today. He underwent I&D and application of wound VAC by Dr. Elsa. Postoperatively, hospitalist service was consulted for inpatient management.  Assessment/Plan: Diabetic foot ulcer Left calcaneal osteomyelitis S/p I&D and wound VAC placement -11/13 Dr. Elsa Wound culture is growing a mix of gram-positive and gram-negative bacteria.  Pending final determination..  Currently on broad-spectrum coverage with IV Zosyn and vancomycin I have requested ID consult. Recent Labs  Lab 06/28/24 1310 06/29/24 0243 06/30/24 0420 07/01/24 0715 07/02/24 0437  WBC 11.7* 15.0* 11.2* 9.6 12.0*   Type 2 diabetes mellitus Diabetic neuropathy A1c 7.3 on 06/28/2024 PTA meds-Ozempic weekly, Tresiba 80 units twice daily, NovoLog twice daily Jardiance 25 mg daily, metformin 1000 mg twice daily Currently on Semglee  50 units twice daily along with SSI/Accu-Cheks Blood sugar level better controlled in last 24 hours.  Seems to be on Topamax  for neuropathy. Recent Labs  Lab 07/01/24 0807 07/01/24 1336 07/01/24 1636 07/01/24 2114  07/02/24 0732  GLUCAP 122* 203* 199* 187* 155*   Hypertension PTA meds- amlodipine 10 mg daily, olmesartan 40 mg daily, HCTZ 25 mg daily Currently on amlodipine 10 mg daily and lisinopril  40 mg daily.  Discussed with pharmacy.  Stop lisinopril  today, resume olmesartan and HCTZ. Continue IV hydralazine as needed.  Elevated creatinine Improving. Recent Labs    06/28/24 1310 06/29/24 0243 06/30/24 0420 07/01/24 0715 07/02/24 0437  BUN 25* 26* 24*  --  21  CREATININE 1.12 1.25* 1.04 1.02 1.09  CO2 24 23 25   --  24    HLD Continue Zocor , fenofibrate   Hypothyroidism Synthroid   Anxiety Trazodone  nightly, Xanax  as needed  GERD Start Protonix  Morbid Obesity  Body mass index is 55 kg/m. Patient has been advised to make an attempt to improve diet and exercise patterns to aid in weight loss.  OSA Nightly CPAP  Regular alcohol use Takes couple of drinks at night.  Last drink was the night before presentation.   Denies any withdrawal symptoms in the past.   Continue CIWA protocol  Impaired mobility Uses cane at home. Mobility restricted because of surgery and wound VAC Seen by PT.  Home health PT recommended. Per Ortho recommendation - Avoid pressure on the heel, ensure heel is elevated at all times. - Partial weightbearing operative extremity through the forefoot only with a walker.  No pressure or weightbearing through the heel.   PT Follow up Rec: Home Health Pt (Pt Reports He Will Decline Follow-Up Therapy)06/29/2024 1300   Goals of care:   Code Status: Full Code    DVT prophylaxis:  SCDs Start: 06/28/24 1706   Antimicrobials: Per ID Fluid: None Consultants: Orthopedics per ID Family  Communication: Not at bedside  Status: Observation Level of care:  Med-Surg   Patient is from: Home Anticipated d/c to: Pending wound culture report, antibiotic determination  Diet:  Diet Order             Diet Carb Modified Fluid consistency: Thin; Room service  appropriate? Yes  Diet effective now                   Scheduled Meds:  amLODipine  10 mg Oral Daily   docusate sodium  100 mg Oral BID   enoxaparin (LOVENOX) injection  0.5 mg/kg Subcutaneous Q24H   folic acid  1 mg Oral Daily   insulin  aspart  0-5 Units Subcutaneous QHS   insulin  aspart  0-9 Units Subcutaneous TID WC   insulin  glargine-yfgn  50 Units Subcutaneous BID   levothyroxine   274 mcg Oral QAC breakfast   lisinopril   40 mg Oral Daily   multivitamin with minerals  1 tablet Oral Daily   naproxen  250 mg Oral BID WC   pantoprazole  40 mg Oral QAC breakfast   simvastatin   20 mg Oral Daily   thiamine  100 mg Oral Daily   Or   thiamine  100 mg Intravenous Daily   topiramate   100 mg Oral QHS   traZODone   50 mg Oral QHS    PRN meds: acetaminophen , albuterol , ALPRAZolam , bisacodyl, diphenhydrAMINE, hydrALAZINE, HYDROcodone -acetaminophen , HYDROcodone -acetaminophen , HYDROmorphone (DILAUDID) injection, methocarbamol **OR** methocarbamol (ROBAXIN) injection, metoCLOPramide **OR** metoCLOPramide (REGLAN) injection, morphine injection, ondansetron **OR** ondansetron (ZOFRAN) IV, polyethylene glycol   Infusions:   piperacillin-tazobactam (ZOSYN)  IV 3.375 g (07/02/24 0533)   vancomycin 1,500 mg (07/02/24 0534)    Antimicrobials: Anti-infectives (From admission, onward)    Start     Dose/Rate Route Frequency Ordered Stop   06/29/24 1800  vancomycin (VANCOREADY) IVPB 1500 mg/300 mL        1,500 mg 150 mL/hr over 120 Minutes Intravenous Every 12 hours 06/29/24 0953     06/28/24 2200  ceFAZolin (ANCEF) IVPB 2g/100 mL premix  Status:  Discontinued        2 g 200 mL/hr over 30 Minutes Intravenous Every 8 hours 06/28/24 1603 06/28/24 1710   06/28/24 2000  ceFAZolin (ANCEF) IVPB 2g/100 mL premix        2 g 200 mL/hr over 30 Minutes Intravenous Every 6 hours 06/28/24 1730 06/29/24 0707   06/28/24 1800  ceFAZolin (ANCEF) IVPB 2g/100 mL premix  Status:  Discontinued        2 g 200  mL/hr over 30 Minutes Intravenous Every 6 hours 06/28/24 1705 06/28/24 1730   06/28/24 1800  piperacillin-tazobactam (ZOSYN) IVPB 3.375 g        3.375 g 12.5 mL/hr over 240 Minutes Intravenous Every 8 hours 06/28/24 1710     06/28/24 1800  vancomycin (VANCOREADY) IVPB 1750 mg/350 mL  Status:  Discontinued        1,750 mg 175 mL/hr over 120 Minutes Intravenous Every 12 hours 06/28/24 1710 06/29/24 0953   06/28/24 1429  vancomycin (VANCOCIN) powder  Status:  Discontinued          As needed 06/28/24 1430 06/28/24 1500   06/28/24 1230  ceFAZolin (ANCEF) IVPB 3g/150 mL premix        3 g 300 mL/hr over 30 Minutes Intravenous On call to O.R. 06/28/24 1133 06/28/24 1435       Objective: Vitals:   07/02/24 0351 07/02/24 0735  BP: (!) 150/78 (!) 142/76  Pulse: 68  61  Resp:  17  Temp: (!) 97.4 F (36.3 C) (!) 97.4 F (36.3 C)  SpO2: 93% 99%    Intake/Output Summary (Last 24 hours) at 07/02/2024 0854 Last data filed at 07/02/2024 0838 Gross per 24 hour  Intake --  Output 1150 ml  Net -1150 ml   Filed Weights   06/28/24 1147  Weight: (!) 199.6 kg   Weight change:  Body mass index is 55 kg/m.   Physical Exam: General exam: Pleasant, middle-aged morbidly obese male.  Morbidly obese.  Feels better with pain control. Skin: No rashes, lesions or ulcers. HEENT: Atraumatic, normocephalic, no obvious bleeding Lungs: Clear to auscultation bilaterally, not on oxygen this morning CVS: S1, S2, no murmur,   GI/Abd: Soft, nontender, distended from obesity, bowel sound present,   CNS: Alert, awake, oriented x 3 Psychiatry: Mood appropriate Extremities: Postop dressing on on left foot with wound VAC on.  No pedal edema, no calf tenderness,   Data Review: I have personally reviewed the laboratory data and studies available.  F/u labs ordered Unresulted Labs (From admission, onward)    None       Signed, Chapman Rota, MD [facility] 07/02/2024

## 2024-07-02 NOTE — Progress Notes (Signed)
 Physical Therapy Treatment Patient Details Name: Johnny Andrews MRN: [id number] DOB: [birth year] Today's Date: 07/02/2024   History of Present Illness Johnny Andrews is a 63 y.o. 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 PMHx: morbid obesity, OSA, T2DM, HTN, HLD, alcohol use, neuropathy, diabetic foot ulcer, anxiety, hypothyroidism, and OA.    PT Comments  Pt up in chair on arrival, agreeable to PT session and demonstrating steady progress towards acute goals. Pt demonstrating increased activity tolerance this session, progressing gait distance with RW for support, CGA for safety and chair follow as pt requiring PRN seated rest due to fatigue. Pt able to come to standing with CGA and demonstrating good adherence to forefoot weight bearing only throughout all mobility. Continued education on importance of frequent mobilization to maximize functional mobility progression with pt verbalizing understanding. Pt was educated on continued walker use to maximize functional independence, safety, and decrease risk for falls. Pt continues to benefit from skilled PT services to progress toward functional mobility goals.      If plan is discharge home, recommend the following: A little help with walking and/or transfers;A little help with bathing/dressing/bathroom;Assistance with cooking/housework;Assist for transportation;Help with stairs or ramp for entrance   Can travel by private vehicle        Equipment Recommendations  Rolling walker (2 wheels);BSC/3in1    Recommendations for Other Services       Precautions / Restrictions Precautions Precautions: Fall Recall of Precautions/Restrictions: Intact Precaution/Restrictions Comments: L foot Wound Vac Restrictions Weight Bearing Restrictions Per Provider Order: Yes LLE Weight Bearing Per Provider Order: Partial weight bearing LLE Partial Weight Bearing Percentage or  Pounds: WB through forefoot only with a walker Other Position/Activity Restrictions: Avoid pressure on the heel, ensure heel is elevated at all times.     Mobility  Bed Mobility Overal bed mobility: Needs Assistance             General bed mobility comments: pt up in chair    Transfers Overall transfer level: Needs assistance Equipment used: Rolling walker (2 wheels) Transfers: Sit to/from Stand Sit to Stand: Contact guard assist           General transfer comment: Good recall for hand placement, Good eccentric control.    Ambulation/Gait Ambulation/Gait assistance: Contact guard assist, +2 safety/equipment (Chair Follow) Gait Distance (Feet): 65 Feet (+ 45) Assistive device: Rolling walker (2 wheels) Gait Pattern/deviations: Step-to pattern, Decreased stride length, Decreased stance time - left, Decreased weight shift to left, Wide base of support, Trunk flexed Gait velocity: decreased     General Gait Details: Pt ambulated with short slow steps. He maintained WBOS secondary to body habitus. Pt offloaded LLE through BUE support on RW only accepting weight in his L forefoot. Distance limited d/t weakness and fatigue. Cues for increased proximity to RW.   Stairs Stairs:  (pt declining stair training, stating he is able to pull car up to and exit onto top of step, does not step up)           Wheelchair Mobility     Tilt Bed    Modified Rankin (Stroke Patients Only)       Balance Overall balance assessment: Needs assistance Sitting-balance support: Feet supported, No upper extremity supported Sitting balance-Leahy Scale: Fair     Standing balance support: Bilateral upper extremity supported, During functional activity, Reliant on assistive device for balance Standing balance-Leahy Scale: Poor  Communication Communication Communication: No apparent difficulties  Cognition Arousal: Alert Behavior During  Therapy: WFL for tasks assessed/performed   PT - Cognitive impairments: No apparent impairments                         Following commands: Intact      Cueing Cueing Techniques: Verbal cues, Tactile cues  Exercises      General Comments        Pertinent Vitals/Pain      Home Living                          Prior Function            PT Goals (current goals can now be found in the care plan section) Acute Rehab PT Goals Patient Stated Goal: Return Home PT Goal Formulation: With patient Time For Goal Achievement: 07/13/24 Progress towards PT goals: Progressing toward goals    Frequency    Min 2X/week      PT Plan      Co-evaluation              AM-PAC PT 6 Clicks Mobility   Outcome Measure  Help needed turning from your back to your side while in a flat bed without using bedrails?: A Little Help needed moving from lying on your back to sitting on the side of a flat bed without using bedrails?: A Little Help needed moving to and from a bed to a chair (including a wheelchair)?: A Little Help needed standing up from a chair using your arms (e.g., wheelchair or bedside chair)?: A Little Help needed to walk in hospital room?: A Little Help needed climbing 3-5 steps with a railing? : A Lot 6 Click Score: 17    End of Session   Activity Tolerance: Patient tolerated treatment well;Patient limited by fatigue Patient left: in chair;with call bell/phone within reach Nurse Communication: Mobility status PT Visit Diagnosis: Difficulty in walking, not elsewhere classified (R26.2);Muscle weakness (generalized) (M62.81);Other abnormalities of gait and mobility (R26.89)     Time: 1107-1130 PT Time Calculation (min) (ACUTE ONLY): 23 min  Charges:    $Gait Training: 23-37 mins PT General Charges $$ ACUTE PT VISIT: 1 Visit                     [name] R. PTA Acute Rehabilitation Services Office: [phone]   [name] 07/02/2024,  1:24 PM

## 2024-07-02 NOTE — Progress Notes (Signed)
     Johnny Andrews is a 63 y.o. male   Orthopaedic diagnosis: Status post I&D of stage IV left calcaneal ulcer and calcaneal saucerization of left calcaneal osteomyelitis with application of wound VAC 06/28/2024   Subjective: Patient resting comfortably in chair.  He is working with physical therapy.  Reports doing well mobilizing with the restrictions with a walker.  Denies any significant pain.  Has had postoperative bowel movement.  On IV antibiotics per hospitalist service.  Multiple organisms identified from intraoperative cultures.  Wound VAC remains in place.   Objectyive: Vitals:   07/02/24 0351 07/02/24 0735  BP: (!) 150/78 (!) 142/76  Pulse: 68 61  Resp:  17  Temp: (!) 97.4 F (36.3 C) (!) 97.4 F (36.3 C)  SpO2: 93% 99%     Exam: Awake and alert Respirations even and unlabored No acute distress  Left foot and ankle with dressing and wound VAC in place.  Some pitting edema distally from the Ace wrap.  The Ace wrap was redressed in a looser fashion. Wound VAC with good suction and output.  Baseline swelling in the lower extremities.  No calf tenderness.  He can wiggle the toes.  He has diminished sensation to light touch in the exposed forefoot and toes which he reports is his baseline.  Toes feel warm and well-perfused distally.   Assessment: Postop day 4 status post the above   Plan: - Keep dressing and wound VAC in place.  Empty wound VAC as needed. - Avoid pressure on the heel, ensure heel is elevated at all times. - Partial weightbearing operative extremity through the forefoot only with a walker.  No pressure or weightbearing through the heel. -Lovenox for DVT prophylaxis.  Plan for aspirin for DVT prophylaxis at discharge. - Pain control as needed. - Intraoperative cultures showing multiple organisms.  Greatly appreciate hospitalist team assistance in management.  Patient is doing well.  We will await hospitalist recommendations for long-term antibiotic  plan.  Will plan to likely remove the wound VAC tomorrow in preparation for discharge.  Physical therapy recommending home health.   Elodie Panameno J. Earle Burson, PA-C

## 2024-07-02 NOTE — Consult Note (Signed)
 [facility]    Date of Admission:  06/28/2024     Total days of antibiotics 5               Reason for Consult: Osteomyelitis   Referring Provider: Dr. Arlice  Primary Care Provider: Purcell Emil Schanz, MD   ASSESSMENT:  Johnny Andrews is a 63 year old Caucasian male with type 2 diabetes complicated by neuropathy presenting with calcaneal ulcer and acute osteomyelitis status post debridement with cultures polymicrobial with Morganella Morgagni, Proteus mirabilis, and Klebsiella oxytoca with mixed anaerobic flora.   Johnny Andrews is postop day #1 from debridement and tolerating antibiotic with no adverse side effects.  Discussed plan of care for 4 weeks of oral antibiotics with levofloxacin and metronidazole.  QTc within normal ranges and okay to start levofloxacin.  Postoperative wound care per orthopedics.  Emphasized importance of blood sugar control and protein intake to optimize healing.  Will arrange follow-up in ID clinic.  Standard/universal precautions.  Remaining medical and supportive care per internal medicine.   PLAN:  Change antibiotics to levofloxacin and metronidazole. Postoperative wound care per orthopedics. Emphasized importance of blood sugar control and protein intake to optimize healing Will arrange follow-up in ID clinic okay for discharge from ID standpoint. Standard/universal precautions. Remaining medical and supportive care per internal medicine/orthopedics.   Principal Problem:   Osteomyelitis of foot (HCC)    amLODipine  10 mg Oral Daily   docusate sodium  100 mg Oral BID   enoxaparin (LOVENOX) injection  0.5 mg/kg Subcutaneous Q24H   folic acid  1 mg Oral Daily   irbesartan  300 mg Oral Daily   And   hydrochlorothiazide  25 mg Oral Daily   insulin  aspart  0-5 Units Subcutaneous QHS   insulin  aspart  0-9 Units Subcutaneous TID WC   insulin  glargine-yfgn  50 Units Subcutaneous BID   levofloxacin  750 mg Oral Daily    levothyroxine   274 mcg Oral QAC breakfast   metroNIDAZOLE  500 mg Oral Q12H   multivitamin with minerals  1 tablet Oral Daily   naproxen  250 mg Oral BID WC   pantoprazole  40 mg Oral QAC breakfast   simvastatin   20 mg Oral Daily   thiamine  100 mg Oral Daily   Or   thiamine  100 mg Intravenous Daily   topiramate   100 mg Oral QHS   traZODone   50 mg Oral QHS     HPI: Johnny Andrews is a 63 y.o. male with previous medical history of morbid obesity, obstructive sleep apnea, type 2 diabetes complicated by neuropathy, and hypertension presenting to the hospital with left posterior calcaneal ulceration and early acute osteomyelitis of the calcaneus.  Johnny Andrews usually noted a wound approximately 2 months ago on his calcaneus and presented to the outpatient orthopedic office with recommendations for debridement.  Afebrile and with leukocytosis of 15,000.  Initially started on piperacillin-tazobactam and vancomycin.  Brought to the OR on 07/02/2024 for debridement of left heel ulceration.  Tissue culture from surgery on 06/28/2024 with Morganella Morgagni, Proteus mirabilis, and Klebsiella oxytoca with mixed anaerobic flora present.  Johnny Andrews is postop day #4 and remains on piperacillin-tazobactam and vancomycin.  Tolerating antibiotics with no adverse side effects.  ID has been asked to make antibiotic recommendations.   Review of Systems: Review of Systems  Constitutional:  Negative for chills, fever and weight loss.  Respiratory:  Negative for cough, shortness of breath and wheezing.  Cardiovascular:  Negative for chest pain and leg swelling.  Gastrointestinal:  Negative for abdominal pain, constipation, diarrhea, nausea and vomiting.  Skin:  Negative for rash.     Past Medical History:  Diagnosis Date   ALCOHOL USE 10/24/2008   ANXIETY 09/19/2007   Cough 05/22/2008   Diabetes mellitus without complication (HCC)    Phreesia 06/02/2020   DIABETES MELLITUS, TYPE II  09/19/2007   External thrombosed hemorrhoids 08/23/2008   GOUT 04/17/2010   HYPERLIPIDEMIA 09/19/2007   HYPERTENSION 09/19/2007   Hypertension    Phreesia 06/02/2020   HYPOTHYROIDISM 09/19/2007   Leg pain    OSTEOARTHRITIS 09/19/2007   OTHER DISEASES OF LUNG NOT ELSEWHERE CLASSIFIED 07/20/2010   Rash and other nonspecific skin eruption 09/19/2007   Restrictive lung disease    SCROTAL ABSCESS 05/27/2010   Sebaceous cyst 08/01/2008   SLEEP APNEA 08/22/2009   Sleep apnea    Phreesia 06/02/2020   SMOKER 04/17/2010   Thyroid  disease    Phreesia 06/02/2020    Social History   Tobacco Use   Smoking status: Former    Current packs/day: 0.00    Average packs/day: 0.5 packs/day for 30.0 years (15.0 ttl pk-yrs)    Types: Cigarettes    Start date: 28    Quit date: 2008    Years since quitting: 17.8   Smokeless tobacco: Never   Tobacco comments:    30 years at most off and on smoking 03/14/15  Vaping Use   Vaping status: Never Used  Substance Use Topics   Alcohol use: Yes    Alcohol/week: 0.0 standard drinks of alcohol    Comment: 2 drinks per day   Drug use: Not Currently    Comment: Occasional Marijuana use    Family History  Problem Relation Age of Onset   Breast cancer Mother        Breast Cancer   Parkinson's disease Mother    Heart disease Father        CABG, stenting cardiac   Cancer Brother 38       prostate cancer   Lung disease Neg Hx    Autoimmune disease Neg Hx     Allergies  Allergen Reactions   Other     Dust mites    OBJECTIVE: Blood pressure 139/79, pulse 71, temperature (!) 96.8 F (36 C), temperature source Rectal, resp. rate 17, height 6' 3 (1.905 m), weight (!) 199.6 kg, SpO2 95%.  Physical Exam Constitutional:      General: He is not in acute distress.    Appearance: He is well-developed.  Cardiovascular:     Rate and Rhythm: Normal rate and regular rhythm.     Heart sounds: Normal heart sounds.  Pulmonary:     Effort: Pulmonary  effort is normal.     Breath sounds: Normal breath sounds.  Skin:    General: Skin is warm and dry.  Neurological:     Mental Status: He is alert and oriented to person, place, and time.     Lab Results Lab Results  Component Value Date   WBC 12.0 (H) 07/02/2024   HGB 12.1 (L) 07/02/2024   HCT 38.5 (L) 07/02/2024   MCV 89.3 07/02/2024   PLT 307 07/02/2024    Lab Results  Component Value Date   CREATININE 1.09 07/02/2024   BUN 21 07/02/2024   NA 134 (L) 07/02/2024   K 3.8 07/02/2024   CL 103 07/02/2024   CO2 24 07/02/2024    Lab Results  Component Value Date   ALT 23 06/28/2024   AST 20 06/28/2024   ALKPHOS 57 06/28/2024   BILITOT 0.4 06/28/2024     Microbiology: Recent Results (from the past 240 hours)  Aerobic/Anaerobic Culture w Gram Stain (surgical/deep wound)     Status: None   Collection Time: 06/28/24  2:30 PM   Specimen: Foot, Left; Tissue  Result Value Ref Range Status   Specimen Description TISSUE  Final   Special Requests LEFT HEEL ULCER  Final   Gram Stain   Final    NO WBC SEEN RARE GRAM POSITIVE COCCI IN PAIRS Performed at [facility], [address], [place], [place] [address]    Culture   Final    RARE MORGANELLA MORGANII RARE PROTEUS MIRABILIS MIXED ANAEROBIC FLORA PRESENT.  CALL LAB IF FURTHER IID REQUIRED.    Report Status 07/02/2024 FINAL  Final   Organism ID, Bacteria MORGANELLA MORGANII  Final   Organism ID, Bacteria PROTEUS MIRABILIS  Final      Susceptibility   Morganella morganii - MIC*    AMPICILLIN >=32 RESISTANT Resistant     ERTAPENEM <=0.12 SENSITIVE Sensitive     CIPROFLOXACIN <=0.06 SENSITIVE Sensitive     GENTAMICIN <=1 SENSITIVE Sensitive     MEROPENEM <=0.25 SENSITIVE Sensitive     TRIMETH /SULFA  <=20 SENSITIVE Sensitive     AMPICILLIN/SULBACTAM >=32 RESISTANT Resistant     PIP/TAZO Value in next row Sensitive      <=4 SENSITIVEThis is a modified FDA-approved test that has been validated and its performance  characteristics determined by the reporting laboratory.  This laboratory is certified under the Clinical Laboratory Improvement Amendments CLIA as qualified to perform high complexity clinical laboratory testing.    * RARE MORGANELLA MORGANII   Proteus mirabilis - MIC*    AMPICILLIN Value in next row Sensitive      <=4 SENSITIVEThis is a modified FDA-approved test that has been validated and its performance characteristics determined by the reporting laboratory.  This laboratory is certified under the Clinical Laboratory Improvement Amendments CLIA as qualified to perform high complexity clinical laboratory testing.    CEFAZOLIN (NON-URINE) Value in next row Intermediate      <=4 SENSITIVEThis is a modified FDA-approved test that has been validated and its performance characteristics determined by the reporting laboratory.  This laboratory is certified under the Clinical Laboratory Improvement Amendments CLIA as qualified to perform high complexity clinical laboratory testing.    CEFEPIME Value in next row Sensitive      <=4 SENSITIVEThis is a modified FDA-approved test that has been validated and its performance characteristics determined by the reporting laboratory.  This laboratory is certified under the Clinical Laboratory Improvement Amendments CLIA as qualified to perform high complexity clinical laboratory testing.    ERTAPENEM Value in next row Sensitive      <=4 SENSITIVEThis is a modified FDA-approved test that has been validated and its performance characteristics determined by the reporting laboratory.  This laboratory is certified under the Clinical Laboratory Improvement Amendments CLIA as qualified to perform high complexity clinical laboratory testing.    CEFTRIAXONE  Value in next row Sensitive      <=4 SENSITIVEThis is a modified FDA-approved test that has been validated and its performance characteristics determined by the reporting laboratory.  This laboratory is certified under the  Clinical Laboratory Improvement Amendments CLIA as qualified to perform high complexity clinical laboratory testing.    CIPROFLOXACIN Value in next row Sensitive      <=4  SENSITIVEThis is a modified FDA-approved test that has been validated and its performance characteristics determined by the reporting laboratory.  This laboratory is certified under the Clinical Laboratory Improvement Amendments CLIA as qualified to perform high complexity clinical laboratory testing.    GENTAMICIN Value in next row Sensitive      <=4 SENSITIVEThis is a modified FDA-approved test that has been validated and its performance characteristics determined by the reporting laboratory.  This laboratory is certified under the Clinical Laboratory Improvement Amendments CLIA as qualified to perform high complexity clinical laboratory testing.    MEROPENEM Value in next row Sensitive      <=4 SENSITIVEThis is a modified FDA-approved test that has been validated and its performance characteristics determined by the reporting laboratory.  This laboratory is certified under the Clinical Laboratory Improvement Amendments CLIA as qualified to perform high complexity clinical laboratory testing.    TRIMETH /SULFA  Value in next row Sensitive      <=4 SENSITIVEThis is a modified FDA-approved test that has been validated and its performance characteristics determined by the reporting laboratory.  This laboratory is certified under the Clinical Laboratory Improvement Amendments CLIA as qualified to perform high complexity clinical laboratory testing.    AMPICILLIN/SULBACTAM Value in next row Sensitive      <=4 SENSITIVEThis is a modified FDA-approved test that has been validated and its performance characteristics determined by the reporting laboratory.  This laboratory is certified under the Clinical Laboratory Improvement Amendments CLIA as qualified to perform high complexity clinical laboratory testing.    PIP/TAZO Value in next row  Sensitive      <=4 SENSITIVEThis is a modified FDA-approved test that has been validated and its performance characteristics determined by the reporting laboratory.  This laboratory is certified under the Clinical Laboratory Improvement Amendments CLIA as qualified to perform high complexity clinical laboratory testing.    * RARE PROTEUS MIRABILIS  Aerobic/Anaerobic Culture w Gram Stain (surgical/deep wound)     Status: None   Collection Time: 06/28/24  2:36 PM   Specimen: Bone; Tissue  Result Value Ref Range Status   Specimen Description BONE  Final   Special Requests LEFT CALCANEOUS  Final   Gram Stain   Final    FEW WBC PRESENT, PREDOMINANTLY PMN MODERATE GRAM POSITIVE COCCI IN PAIRS FEW GRAM NEGATIVE RODS RARE GRAM POSITIVE RODS Performed at [facility], [address], [place], [place] [address]    Culture   Final    FEW MORGANELLA MORGANII FEW PROTEUS MIRABILIS FEW KLEBSIELLA OXYTOCA SUSCEPTIBILITIES PERFORMED ON PREVIOUS CULTURE WITHIN THE LAST 5 DAYS. MIXED ANAEROBIC FLORA PRESENT.  CALL LAB IF FURTHER IID REQUIRED.    Report Status 07/02/2024 FINAL  Final   Organism ID, Bacteria KLEBSIELLA OXYTOCA  Final      Susceptibility   Klebsiella oxytoca - MIC*    AMPICILLIN RESISTANT Resistant     CEFEPIME <=0.12 SENSITIVE Sensitive     ERTAPENEM <=0.12 SENSITIVE Sensitive     CEFTRIAXONE  <=0.25 SENSITIVE Sensitive     CIPROFLOXACIN <=0.06 SENSITIVE Sensitive     GENTAMICIN <=1 SENSITIVE Sensitive     MEROPENEM <=0.25 SENSITIVE Sensitive     TRIMETH /SULFA  <=20 SENSITIVE Sensitive     AMPICILLIN/SULBACTAM 8 SENSITIVE Sensitive     PIP/TAZO Value in next row Sensitive      <=4 SENSITIVEThis is a modified FDA-approved test that has been validated and its performance characteristics determined by the reporting laboratory.  This laboratory is certified under the Clinical Laboratory Improvement Amendments CLIA  as qualified to perform high complexity clinical laboratory testing.     * FEW KLEBSIELLA OXYTOCA     Cathlyn July, NP [facility]  07/02/2024  3:47 PM

## 2024-07-03 ENCOUNTER — Other Ambulatory Visit (HOSPITAL_COMMUNITY): Payer: Self-pay

## 2024-07-03 DIAGNOSIS — M869 Osteomyelitis, unspecified: Secondary | ICD-10-CM | POA: Diagnosis not present

## 2024-07-03 LAB — GLUCOSE, CAPILLARY
Glucose-Capillary: 164 mg/dL — ABNORMAL HIGH (ref 70–99)
Glucose-Capillary: 166 mg/dL — ABNORMAL HIGH (ref 70–99)
Glucose-Capillary: 250 mg/dL — ABNORMAL HIGH (ref 70–99)

## 2024-07-03 MED ORDER — ACETAMINOPHEN 325 MG PO TABS
325.0000 mg | ORAL_TABLET | Freq: Four times a day (QID) | ORAL | 0 refills | Status: AC | PRN
  Filled 2024-07-03: qty 60, 8d supply, fill #0

## 2024-07-03 MED ORDER — OLMESARTAN MEDOXOMIL-HCTZ 40-25 MG PO TABS
1.0000 | ORAL_TABLET | Freq: Every day | ORAL | 0 refills | Status: DC
  Filled 2024-07-03: qty 30, 30d supply, fill #0

## 2024-07-03 MED ORDER — AMLODIPINE BESYLATE 10 MG PO TABS
10.0000 mg | ORAL_TABLET | Freq: Every day | ORAL | 0 refills | Status: AC
Start: 2024-07-03 — End: ?
  Filled 2024-07-03: qty 30, 30d supply, fill #0

## 2024-07-03 MED ORDER — INSULIN GLARGINE-YFGN 100 UNIT/ML ~~LOC~~ SOLN
60.0000 [IU] | Freq: Two times a day (BID) | SUBCUTANEOUS | Status: DC
  Administered 2024-07-03: 60 [IU] via SUBCUTANEOUS
  Filled 2024-07-03 (×2): qty 0.6

## 2024-07-03 MED ORDER — METHOCARBAMOL 500 MG PO TABS
500.0000 mg | ORAL_TABLET | Freq: Four times a day (QID) | ORAL | 0 refills | Status: DC | PRN
  Filled 2024-07-03: qty 30, 8d supply, fill #0

## 2024-07-03 MED ORDER — METRONIDAZOLE 500 MG PO TABS
500.0000 mg | ORAL_TABLET | Freq: Two times a day (BID) | ORAL | 0 refills | Status: DC
  Filled 2024-07-03: qty 56, 28d supply, fill #0

## 2024-07-03 MED ORDER — LEVOFLOXACIN 750 MG PO TABS
750.0000 mg | ORAL_TABLET | Freq: Every day | ORAL | 0 refills | Status: AC
  Filled 2024-07-03: qty 28, 28d supply, fill #0

## 2024-07-03 MED ORDER — ASPIRIN 325 MG PO TABS
ORAL_TABLET | ORAL | 0 refills | Status: AC
  Filled 2024-07-03: qty 30, 30d supply, fill #0

## 2024-07-03 NOTE — Discharge Instructions (Signed)
 [facility] POST-OP INSTRUCTIONS   Pain Management Keep your foot elevated above heart level.  Make sure that your heel hangs free ('floats'). Take all prescribed medication as directed. If taking narcotic pain medication you may want to use an over-the-counter stool softener to avoid constipation.  Activity No weight bearing through the left heel  Keep dressing in place  First Postoperative Visit Your first postop visit will be 07/09/24 with Dr. Elsa  If you do not have a postoperative visit scheduled please call [phone] to schedule an appointment. At the appointment your incision will be evaluated for suture removal, x-rays will be obtained if necessary.  General Instructions Swelling is very common after foot and ankle surgery.  It often takes 3 months for the foot and ankle to begin to feel comfortable.  Some amount of swelling will persist for 6-12 months. DO NOT change the dressing.  If there is a problem with the dressing (too tight, loose, gets wet, etc.) please contact Dr. Isiah office. DO NOT get the dressing wet.  For showers you can use an over-the-counter cast cover or wrap a washcloth around the top of your dressing and then cover it with a plastic bag and tape it to your leg. DO NOT soak the incision (no tubs, pools, bath, etc.) until you have approval from Dr. Elsa.  Contact Dr. Nadara office or go to Emergency Room if: Temperature above 101 F. Increasing pain that is unresponsive to pain medication or elevation Excessive redness or swelling in your foot Dressing problems - excessive bloody drainage, looseness or tightness, or if dressing gets wet Develop pain, swelling, warmth, or discoloration of your calf

## 2024-07-03 NOTE — Progress Notes (Signed)
     Johnny Andrews is a 63 y.o. 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  Looking forward to discharge home.  Pain is minimal to none.  Organisms have been identified on intraoperative cultures and he has been consulted by infectious disease.  There is a plan for oral antibiotics x 1 month with follow-up outpatient.  Wound but has slowed.  Denies fever or chills.  He feels comfortable with his weightbearing restrictions with a walker and has worked with therapy.  He does describe some urinary frequency but no malodor, burning, or reported abnormal appearance.  Objectyive: Vitals:   07/03/24 0511 07/03/24 0800  BP: 132/70 134/75  Pulse: 70 67  Resp: 18 16  Temp: 98 F (36.7 C) 98 F (36.7 C)  SpO2: 100% 97%     Exam: Awake and alert Respirations even and unlabored No acute distress   Left foot and ankle with dressing and wound VAC in place with minimal output at this point.  Wound VAC was removed uneventfully.  Wound with sutures in place.  Some surrounding skin maceration but wound well-approximated.  No significant drainage.  Wound was redressed with nonadherent pad, gauze, and secured with an Ace wrap uneventfully.  Patient able to wiggle the toes and toes remain warm and well-perfused distally.  Baseline diminished sensation to light touch in the toes.  Calf is soft and nontender.   Assessment: Postop day 5 status post the above  Plan: Plan for discharge home today with home health therapy.  Wound VAC has been removed.  - Keep dressing clean, dry, and in place. - Avoid pressure on the heel, ensure heel is elevated at all times. - Partial weightbearing operative extremity through the forefoot only with walker.  No pressure or weightbearing through the heel - Transition to aspirin for DVT  prophylaxis x 30 days at discharge - Pain is controlled and he has baseline neuropathic changes.  Narcotics not indicated, Tylenol  for pain - He is taking a muscle relaxer here and would like that at discharge which is appropriate.  Greatly appreciate infectious disease and hospitalist service assistance.  He will continue on oral antibiotics per infectious disease recommendation and follow-up with ID outpatient.  Does understand he has a limb threatening situation and we will follow him closely outpatient with appointment next Mond  Johnny Macomber J. Analucia Hush, PA-C

## 2024-07-03 NOTE — Progress Notes (Signed)
 Discharge   Patient expressed verbal understanding of discharge POC.   Patient given time to ask any questions.  Additional education included in AVS.  Alert oriented in good spirits.   NO tele or PIV on during discharge.   Left Foot wound dressing  visually intact.  Per patient he has been given wound instructions.    Patient will discharge to lounge.  TOC meds in process.

## 2024-07-03 NOTE — Discharge Summary (Signed)
 Physician Discharge Summary  Johnny Andrews FMW:[id number] DOB: [birth year] DOA: 06/28/2024  PCP: Purcell Emil Schanz, MD  Admit date: 06/28/2024 Discharge date: 07/03/2024  Admitted from: Home Discharge disposition: Home with PT  Recommendations at discharge:  Complete the course of antibiotics with 4 weeks of Levaquin /Flagyl  Avoid alcohol while on Flagyl  Per Ortho recommendation - Avoid pressure on the heel, ensure heel is elevated at all times. - Partial weightbearing operative extremity through the forefoot only with a walker.  No pressure or weightbearing through the heel.   Subjective: Patient was seen and examined this a.m. Sitting up in recliner.  Not in distress. Wound VAC was removed this morning.  Feels great. Afebrile, hemodynamically stable, breathing on room air Blood sugar level overall controlled.  Brief narrative: Johnny Andrews is a 63 y.o. male with PMH significant for morbid obesity, OSA, DM2, HTN, HLD, alcohol use, Neuropathy, diabetic foot ulcer, hypothyroidism, osteoarthritis In the last 2 months, patient developed an ulceration in the left heel which progressively worsened and was associated with moderate to severe pain.  He was seen in Ortho clinic, was recommended to go to ED but patient was reluctant and hence brought in for an elective surgery today. He underwent I&D and application of wound VAC by Dr. Elsa. Postoperatively, hospitalist service was consulted for inpatient management.  Hospital course: Diabetic foot ulcer Left calcaneal osteomyelitis S/p I&D and wound VAC placement -11/13 Dr. Elsa Wound culture grew Morganella, Proteus, mixed aerobic flora: Klebsiella oxytoca.   Pain started on on broad-spectrum coverage with IV Zosyn  and vancomycin  Per ID recommendation, antibiotics have been switched to levofloxacin /Flagyl  for 4 weeks. Wound VAC removed by orthopedics today. To follow-up with orthopedics as an outpatient Seen by PT.   Home health PT recommended Pain control with as needed Tylenol , Robaxin  Recent Labs  Lab 06/28/24 1310 06/29/24 0243 06/30/24 0420 07/01/24 0715 07/02/24 0437  WBC 11.7* 15.0* 11.2* 9.6 12.0*   Type 2 diabetes mellitus Diabetic neuropathy A1c 7.3 on 06/28/2024 PTA meds-Ozempic weekly, Tresiba  80 units twice daily, NovoLog  twice daily Jardiance 25 mg daily, metformin 1000 mg twice daily Continue regimen as before  Hypertension PTA meds- amlodipine  10 mg daily, olmesartan  40 mg daily, HCTZ 25 mg daily Continue same.  Elevated creatinine Improving. Recent Labs    06/28/24 1310 06/29/24 0243 06/30/24 0420 07/01/24 0715 07/02/24 0437  BUN 25* 26* 24*  --  21  CREATININE 1.12 1.25* 1.04 1.02 1.09  CO2 24 23 25   --  24    HLD Continue Zocor , fenofibrate   Hypothyroidism Synthroid   Anxiety Trazodone  nightly, Xanax  as needed  GERD Start Protonix   Morbid Obesity  Body mass index is 55 kg/m. Patient has been advised to make an attempt to improve diet and exercise patterns to aid in weight loss.  OSA Nightly CPAP  Regular alcohol use Takes couple of drinks at night.  Last drink was the night before presentation.   Denies any withdrawal symptoms in the past.   Continue CIWA protocol  Impaired mobility Uses cane at home. Mobility restricted because of surgery and wound VAC Seen by PT.  Home health PT recommended. Per Ortho recommendation - Avoid pressure on the heel, ensure heel is elevated at all times. - Partial weightbearing operative extremity through the forefoot only with a walker.  No pressure or weightbearing through the heel.   Goals of care:   Code Status: Full Code   Diet:  Diet Order  Diet - low sodium heart healthy           Diet Carb Modified Fluid consistency: Thin; Room service appropriate? Yes  Diet effective now                   Nutritional status:  Body mass index is 55 kg/m.       Wounds:  - Wound 06/28/24  1440 Surgical Closed Surgical Incision Foot Left (Active)  Date First Assessed/Time First Assessed: 06/28/24 1440   Primary Wound Type: Surgical  Secondary Wound Type - Surgical: Closed Surgical Incision  Location: Foot  Location Orientation: Left  Wound Description (Comments): dermatac and hospital wound vac...    Assessments 06/28/2024  3:03 PM 07/02/2024  7:45 PM  Site / Wound Assessment Dressing in place / Unable to assess Dressing in place / Unable to assess  Drainage Description No odor --  Drainage Amount Scant --  Dressing Type Gauze (Comment);Silver hydrofiber;Compression wrap --  Dressing Changed New --  Dressing Status Clean, Dry, Intact --     No associated orders.    Discharge Medications:   Allergies as of 07/03/2024       Reactions   Other    Dust mites        Medication List     STOP taking these medications    diclofenac  75 MG EC tablet Commonly known as: VOLTAREN    lisinopril  20 MG tablet Commonly known as: ZESTRIL        TAKE these medications    acetaminophen  325 MG tablet Commonly known as: TYLENOL  Take 1-2 tablets (325-650 mg total) by mouth every 6 (six) hours as needed.   Adult Mask Misc 2 Devices by Does not apply route once.   ALPRAZolam  0.5 MG tablet Commonly known as: XANAX  TAKE 1 TABLET BY MOUTH TWICE A DAY AS NEEDED FOR ANXIETY What changed: See the new instructions.   amLODipine  10 MG tablet Commonly known as: NORVASC  Take 1 tablet (10 mg total) by mouth daily.   aspirin  325 MG tablet Commonly known as: Bayer Aspirin  Take 1 tablet by mouth for 30 DAYS for blood clot prevention   BD ULTRA-FINE PEN NEEDLES 29G X 12.7MM Misc Generic drug: Insulin  Pen Needle USE TWO CHECK BLOOD SUGAR TWICE A DAY. APPOINTMENT NEEDED FOR FURTHER REFILLS   blood glucose meter kit and supplies 1 each by Other route 2 (two) times daily.   empagliflozin 25 MG Tabs tablet Commonly known as: JARDIANCE Take 25 mg by mouth daily.   fenofibrate   micronized 134 MG capsule Commonly known as: LOFIBRA Take 134 mg by mouth daily.   FISH OIL PO Take 1 capsule by mouth daily at 12 noon.   glucose blood test strip Commonly known as: ONE TOUCH ULTRA TEST Two times a day for ULTRA 2 ONE TOUCH   insulin  aspart 100 UNIT/ML FlexPen Commonly known as: NOVOLOG  Inject 75 Units into the skin 2 (two) times daily before a meal. 30 units am and 50 units pm   levofloxacin  750 MG tablet Commonly known as: LEVAQUIN  Take 1 tablet (750 mg total) by mouth daily.   levothyroxine  137 MCG tablet Commonly known as: SYNTHROID  TAKE 2 TABLETS (274 MCG TOTAL) BY MOUTH DAILY BEFORE BREAKFAST.   metFORMIN 500 MG 24 hr tablet Commonly known as: GLUCOPHAGE-XR Take 1,000 mg by mouth 2 (two) times daily.   methocarbamol  500 MG tablet Commonly known as: ROBAXIN  Take 1 tablet (500 mg total) by mouth every 6 (six) hours as needed for  muscle spasms.   metroNIDAZOLE 500 MG tablet Commonly known as: FLAGYL Take 1 tablet (500 mg total) by mouth every 12 (twelve) hours for 28 days.   MULTIVITAMIN PO Take 1 tablet by mouth daily at 12 noon.   olmesartan-hydrochlorothiazide 40-25 MG tablet Commonly known as: BENICAR HCT Take 1 tablet by mouth daily.   Ozempic (1 MG/DOSE) 4 MG/3ML Sopn Generic drug: Semaglutide (1 MG/DOSE) Inject 10 mg into the skin once a week.   simvastatin  20 MG tablet Commonly known as: ZOCOR  Take 20 mg by mouth daily.   topiramate  50 MG tablet Commonly known as: TOPAMAX  TAKE 2 TABLETS BY MOUTH AT BEDTIME.   traZODone  100 MG tablet Commonly known as: DESYREL  TAKE 1 TABLET BY MOUTH EVERYDAY AT BEDTIME   Tresiba FlexTouch 200 UNIT/ML FlexTouch Pen Generic drug: insulin  degludec INJECT 100 UNITS TWICE A DAY               Discharge Care Instructions  (From admission, onward)           Start     Ordered   07/03/24 0000  Partial weight bearing       Comments: No weightbearing through the left heel   07/03/24 1146              Follow ups:    Follow-up Information     Elsa Lonni SAUNDERS, MD Follow up in 6 day(s).   Specialty: Orthopedic Surgery Contact information: [address] [place] [address] [phone]         Purcell Emil Schanz, MD Follow up.   Specialty: Internal Medicine Contact information: [address] [place] [address] [phone]                 Discharge Instructions:   Discharge Instructions     Call MD / Call 911   Complete by: As directed    If you experience chest pain or shortness of breath, CALL 911 and be transported to the hospital emergency room.  If you develope a fever above 101 F, pus (white drainage) or increased drainage or redness at the wound, or calf pain, call your surgeon's office.   Constipation Prevention   Complete by: As directed    Drink plenty of fluids.  Prune juice may be helpful.  You may use a stool softener, such as Colace (over the counter) 100 mg twice a day.  Use MiraLax (over the counter) for constipation as needed.   Diet - low sodium heart healthy   Complete by: As directed    Carb controlled/diabetic diet   Discharge instructions   Complete by: As directed    Recommendations at discharge:   Complete the course of antibiotics with 4 weeks of Levaquin/Flagyl  Avoid alcohol while on Flagyl  Per Ortho recommendation - Avoid pressure on the heel, ensure heel is elevated at all times. - Partial weightbearing operative extremity through the forefoot only with a walker.  No pressure or weightbearing through the heel.   Partial weight bearing   Complete by: As directed    No weightbearing through the left heel   Post-operative opioid taper instructions:   Complete by: As directed    POST-OPERATIVE OPIOID TAPER INSTRUCTIONS: It is important to wean off of your opioid medication as soon as possible. If you do not need pain medication after your surgery it is ok to stop day one. Opioids include: Codeine,  Hydrocodone (Norco, Vicodin), Oxycodone (Percocet, oxycontin ) and hydromorphone amongst others.  Long term and even  short term use of opiods can cause: Increased pain response Dependence Constipation Depression Respiratory depression And more.  Withdrawal symptoms can include Flu like symptoms Nausea, vomiting And more Techniques to manage these symptoms Hydrate well Eat regular healthy meals Stay active Use relaxation techniques(deep breathing, meditating, yoga) Do Not substitute Alcohol to help with tapering If you have been on opioids for less than two weeks and do not have pain than it is ok to stop all together.  Plan to wean off of opioids This plan should start within one week post op of your joint replacement. Maintain the same interval or time between taking each dose and first decrease the dose.  Cut the total daily intake of opioids by one tablet each day Next start to increase the time between doses. The last dose that should be eliminated is the evening dose.          Discharge Exam:   Vitals:   07/02/24 1504 07/02/24 1945 07/03/24 0511 07/03/24 0800  BP: 139/79 (!) 141/63 132/70 134/75  Pulse: 71 74 70 67  Resp: 17 18 18 16   Temp: (!) 96.8 F (36 C) 98.2 F (36.8 C) 98 F (36.7 C) 98 F (36.7 C)  TempSrc: Rectal Oral Oral Oral  SpO2: 95% 95% 100% 97%  Weight:      Height:        Body mass index is 55 kg/m.  General exam: Pleasant, middle-aged morbidly obese male.  Morbidly obese.  Feels better with pain control. Skin: No rashes, lesions or ulcers. HEENT: Atraumatic, normocephalic, no obvious bleeding Lungs: Clear to auscultation bilaterally, not on oxygen this morning CVS: S1, S2, no murmur,   GI/Abd: Soft, nontender, distended from obesity, bowel sound present,   CNS: Alert, awake, oriented x 3 Psychiatry: Mood appropriate Extremities: Postop dressing on on left foot.  Wound VAC removed today   The results of significant diagnostics from this  hospitalization (including imaging, microbiology, ancillary and laboratory) are listed below for reference.    Procedures and Diagnostic Studies:   No results found.   Labs:   Basic Metabolic Panel: Recent Labs  Lab 06/28/24 1310 06/29/24 0243 06/30/24 0420 07/01/24 0715 07/02/24 0437  NA 135 133* 136  --  134*  K 4.6 4.7 4.1  --  3.8  CL 100 100 101  --  103  CO2 24 23 25   --  24  GLUCOSE 110* 202* 149*  --  189*  BUN 25* 26* 24*  --  21  CREATININE 1.12 1.25* 1.04 1.02 1.09  CALCIUM 9.3 8.9 8.8*  --  8.5*   GFR Estimated Creatinine Clearance: 128 mL/min (by C-G formula based on SCr of 1.09 mg/dL). Liver Function Tests: Recent Labs  Lab 06/28/24 1310  AST 20  ALT 23  ALKPHOS 57  BILITOT 0.4  PROT 7.2  ALBUMIN 3.0*   No results for input(s): LIPASE, AMYLASE in the last 168 hours. No results for input(s): AMMONIA in the last 168 hours. Coagulation profile No results for input(s): INR, PROTIME in the last 168 hours.  CBC: Recent Labs  Lab 06/28/24 1310 06/29/24 0243 06/30/24 0420 07/01/24 0715 07/02/24 0437  WBC 11.7* 15.0* 11.2* 9.6 12.0*  NEUTROABS  --   --  7.1  --  7.9*  HGB 13.7 13.1 12.4* 12.4* 12.1*  HCT 43.4 40.7 39.6 39.8 38.5*  MCV 89.5 88.9 89.2 89.2 89.3  PLT 330 328 338 323 307   Cardiac Enzymes: No results for input(s): CKTOTAL, CKMB,  CKMBINDEX, TROPONINI in the last 168 hours. BNP: Invalid input(s): POCBNP CBG: Recent Labs  Lab 07/02/24 1150 07/02/24 1645 07/02/24 1958 07/03/24 0616 07/03/24 0812  GLUCAP 246* 231* 164* 164* 166*   D-Dimer No results for input(s): DDIMER in the last 72 hours. Hgb A1c No results for input(s): HGBA1C in the last 72 hours. Lipid Profile No results for input(s): CHOL, HDL, LDLCALC, TRIG, CHOLHDL, LDLDIRECT in the last 72 hours. Thyroid  function studies No results for input(s): TSH, T4TOTAL, T3FREE, THYROIDAB in the last 72 hours.  Invalid input(s):  FREET3 Anemia work up No results for input(s): VITAMINB12, FOLATE, FERRITIN, TIBC, IRON, RETICCTPCT in the last 72 hours. Microbiology Recent Results (from the past 240 hours)  Aerobic/Anaerobic Culture w Gram Stain (surgical/deep wound)     Status: None   Collection Time: 06/28/24  2:30 PM   Specimen: Foot, Left; Tissue  Result Value Ref Range Status   Specimen Description TISSUE  Final   Special Requests LEFT HEEL ULCER  Final   Gram Stain   Final    NO WBC SEEN RARE GRAM POSITIVE COCCI IN PAIRS Performed at [facility], [address], [place], [place] [address]    Culture   Final    RARE MORGANELLA MORGANII RARE PROTEUS MIRABILIS MIXED ANAEROBIC FLORA PRESENT.  CALL LAB IF FURTHER IID REQUIRED.    Report Status 07/02/2024 FINAL  Final   Organism ID, Bacteria MORGANELLA MORGANII  Final   Organism ID, Bacteria PROTEUS MIRABILIS  Final      Susceptibility   Morganella morganii - MIC*    AMPICILLIN >=32 RESISTANT Resistant     ERTAPENEM <=0.12 SENSITIVE Sensitive     CIPROFLOXACIN <=0.06 SENSITIVE Sensitive     GENTAMICIN <=1 SENSITIVE Sensitive     MEROPENEM <=0.25 SENSITIVE Sensitive     TRIMETH /SULFA  <=20 SENSITIVE Sensitive     AMPICILLIN/SULBACTAM >=32 RESISTANT Resistant     PIP/TAZO Value in next row Sensitive      <=4 SENSITIVEThis is a modified FDA-approved test that has been validated and its performance characteristics determined by the reporting laboratory.  This laboratory is certified under the Clinical Laboratory Improvement Amendments CLIA as qualified to perform high complexity clinical laboratory testing.    * RARE MORGANELLA MORGANII   Proteus mirabilis - MIC*    AMPICILLIN Value in next row Sensitive      <=4 SENSITIVEThis is a modified FDA-approved test that has been validated and its performance characteristics determined by the reporting laboratory.  This laboratory is certified under the Clinical Laboratory Improvement Amendments CLIA  as qualified to perform high complexity clinical laboratory testing.    CEFAZOLIN (NON-URINE) Value in next row Intermediate      <=4 SENSITIVEThis is a modified FDA-approved test that has been validated and its performance characteristics determined by the reporting laboratory.  This laboratory is certified under the Clinical Laboratory Improvement Amendments CLIA as qualified to perform high complexity clinical laboratory testing.    CEFEPIME Value in next row Sensitive      <=4 SENSITIVEThis is a modified FDA-approved test that has been validated and its performance characteristics determined by the reporting laboratory.  This laboratory is certified under the Clinical Laboratory Improvement Amendments CLIA as qualified to perform high complexity clinical laboratory testing.    ERTAPENEM Value in next row Sensitive      <=4 SENSITIVEThis is a modified FDA-approved test that has been validated and its performance characteristics determined by the reporting laboratory.  This laboratory is certified  under the Clinical Laboratory Improvement Amendments CLIA as qualified to perform high complexity clinical laboratory testing.    CEFTRIAXONE  Value in next row Sensitive      <=4 SENSITIVEThis is a modified FDA-approved test that has been validated and its performance characteristics determined by the reporting laboratory.  This laboratory is certified under the Clinical Laboratory Improvement Amendments CLIA as qualified to perform high complexity clinical laboratory testing.    CIPROFLOXACIN Value in next row Sensitive      <=4 SENSITIVEThis is a modified FDA-approved test that has been validated and its performance characteristics determined by the reporting laboratory.  This laboratory is certified under the Clinical Laboratory Improvement Amendments CLIA as qualified to perform high complexity clinical laboratory testing.    GENTAMICIN Value in next row Sensitive      <=4 SENSITIVEThis is a modified  FDA-approved test that has been validated and its performance characteristics determined by the reporting laboratory.  This laboratory is certified under the Clinical Laboratory Improvement Amendments CLIA as qualified to perform high complexity clinical laboratory testing.    MEROPENEM Value in next row Sensitive      <=4 SENSITIVEThis is a modified FDA-approved test that has been validated and its performance characteristics determined by the reporting laboratory.  This laboratory is certified under the Clinical Laboratory Improvement Amendments CLIA as qualified to perform high complexity clinical laboratory testing.    TRIMETH /SULFA  Value in next row Sensitive      <=4 SENSITIVEThis is a modified FDA-approved test that has been validated and its performance characteristics determined by the reporting laboratory.  This laboratory is certified under the Clinical Laboratory Improvement Amendments CLIA as qualified to perform high complexity clinical laboratory testing.    AMPICILLIN/SULBACTAM Value in next row Sensitive      <=4 SENSITIVEThis is a modified FDA-approved test that has been validated and its performance characteristics determined by the reporting laboratory.  This laboratory is certified under the Clinical Laboratory Improvement Amendments CLIA as qualified to perform high complexity clinical laboratory testing.    PIP/TAZO Value in next row Sensitive      <=4 SENSITIVEThis is a modified FDA-approved test that has been validated and its performance characteristics determined by the reporting laboratory.  This laboratory is certified under the Clinical Laboratory Improvement Amendments CLIA as qualified to perform high complexity clinical laboratory testing.    * RARE PROTEUS MIRABILIS  Aerobic/Anaerobic Culture w Gram Stain (surgical/deep wound)     Status: None   Collection Time: 06/28/24  2:36 PM   Specimen: Bone; Tissue  Result Value Ref Range Status   Specimen Description BONE  Final    Special Requests LEFT CALCANEOUS  Final   Gram Stain   Final    FEW WBC PRESENT, PREDOMINANTLY PMN MODERATE GRAM POSITIVE COCCI IN PAIRS FEW GRAM NEGATIVE RODS RARE GRAM POSITIVE RODS Performed at [facility], [address], [place], [place] [address]    Culture   Final    FEW MORGANELLA MORGANII FEW PROTEUS MIRABILIS FEW KLEBSIELLA OXYTOCA SUSCEPTIBILITIES PERFORMED ON PREVIOUS CULTURE WITHIN THE LAST 5 DAYS. MIXED ANAEROBIC FLORA PRESENT.  CALL LAB IF FURTHER IID REQUIRED.    Report Status 07/02/2024 FINAL  Final   Organism ID, Bacteria KLEBSIELLA OXYTOCA  Final      Susceptibility   Klebsiella oxytoca - MIC*    AMPICILLIN RESISTANT Resistant     CEFEPIME <=0.12 SENSITIVE Sensitive     ERTAPENEM <=0.12 SENSITIVE Sensitive     CEFTRIAXONE  <=0.25 SENSITIVE Sensitive  CIPROFLOXACIN <=0.06 SENSITIVE Sensitive     GENTAMICIN <=1 SENSITIVE Sensitive     MEROPENEM <=0.25 SENSITIVE Sensitive     TRIMETH /SULFA  <=20 SENSITIVE Sensitive     AMPICILLIN/SULBACTAM 8 SENSITIVE Sensitive     PIP/TAZO Value in next row Sensitive      <=4 SENSITIVEThis is a modified FDA-approved test that has been validated and its performance characteristics determined by the reporting laboratory.  This laboratory is certified under the Clinical Laboratory Improvement Amendments CLIA as qualified to perform high complexity clinical laboratory testing.    * FEW KLEBSIELLA OXYTOCA    Time coordinating discharge: 45 minutes  Signed: Sixto Bowdish  [facility] 07/03/2024, 11:46 AM

## 2024-07-03 NOTE — TOC Initial Note (Signed)
 Transition of Care (TOC) - Initial/Assessment Note   Anticipated discharge today.  Patient from home with spouse.   Discussed PT recommendations for HHPT, rolling walker and 3 in1.   Patient agreeable to HHPT no preference. Cory with [facility] accepted referral.   Patient has a walker, raised commode seat and shower chair at home.   NCM showed him the 3 in 1 in his room and discussed uses, patient declined one for home at this time  Patient Details  Name: Johnny Andrews MRN: [id number] Date of Birth: [birth year]  Transition of Care [facility]) CM/SW Contact:    Stephane Powell Jansky, RN Phone Number: 07/03/2024, 8:52 AM  Clinical Narrative:                   Expected Discharge Plan: Home w Home Health Services Barriers to Discharge: Continued Medical Work up   Patient Goals and CMS Choice Patient states their goals for this hospitalization and ongoing recovery are:: to return to home CMS Medicare.gov Compare Post Acute Care list provided to:: Patient Choice offered to / list presented to : Patient      Expected Discharge Plan and Services   Discharge Planning Services: CM Consult Post Acute Care Choice: Home Health Living arrangements for the past 2 months: Single Family Home                   DME Agency: NA       HH Arranged: PT HH Agency: [facility] Date [facility] Agency Contacted: 07/03/24 Time HH Agency Contacted: [phone] Representative spoke with at [facility] Agency: Darleene  Prior Living Arrangements/Services Living arrangements for the past 2 months: Single Family Home Lives with:: Spouse Patient language and need for interpreter reviewed:: Yes Do you feel safe going back to the place where you live?: Yes      Need for Family Participation in Patient Care: Yes (Comment) Care giver support system in place?: Yes (comment) Current home services: DME Criminal Activity/Legal Involvement Pertinent to Current Situation/Hospitalization: No - Comment as needed  Activities of  Daily Living   ADL Screening (condition at time of admission) Independently performs ADLs?: Yes (appropriate for developmental age) Is the patient deaf or have difficulty hearing?: No Does the patient have difficulty seeing, even when wearing glasses/contacts?: No Does the patient have difficulty concentrating, remembering, or making decisions?: No  Permission Sought/Granted   Permission granted to share information with : Yes, Verbal Permission Granted     Permission granted to share info w AGENCY: [facility]        Emotional Assessment Appearance:: Appears stated age Attitude/Demeanor/Rapport: Engaged Affect (typically observed): Appropriate Orientation: : Oriented to Self, Oriented to Place, Oriented to  Time, Oriented to Situation Alcohol / Substance Use: Not Applicable Psych Involvement: No (comment)  Admission diagnosis:  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) [L89.624] Osteomyelitis of foot (HCC) [M86.9] Patient Active Problem List   Diagnosis Date Noted   Osteomyelitis of foot (HCC) 06/28/2024   Stage 3a chronic kidney disease (HCC) 01/11/2023   Acute cough 08/26/2022   Lower respiratory infection 08/26/2022   Plantar fasciitis, bilateral 06/01/2021   Heel pain, bilateral 05/30/2021   Claudication 07/08/2020   Chronic venous insufficiency 07/25/2019   Encounter for orthopedic follow-up care 08/14/2018   Carpal tunnel syndrome of right wrist 04/24/2018   Diabetic peripheral neuropathy (HCC) 04/24/2018   Morbid obesity (HCC) 01/27/2018   Osteoarthritis of left knee 12/16/2017   Osteoarthritis of right knee 12/16/2017  Family history of prostate cancer 03/06/2017   Pure hypercholesterolemia 09/13/2016   Lumbar degenerative disc disease 07/22/2016   Spinal stenosis of lumbar region 11/03/2015   Abnormality of gait 09/15/2015   Restrictive lung disease 05/05/2015   Impotence of organic origin 02/05/2013   Hypogonadism male 02/05/2013   Elevated PSA 11/30/2011    Chest congestion 07/20/2010   GOUT 04/17/2010   SMOKER 04/17/2010   Sleep apnea 08/22/2009   Hypothyroidism 09/19/2007   Dyslipidemia associated with type 2 diabetes mellitus (HCC) 09/19/2007   Situational anxiety 09/19/2007   Hypertension associated with diabetes (HCC) 09/19/2007   Osteoarthritis 09/19/2007   PCP:  Purcell Emil Schanz, MD Pharmacy:   [id number] - [place],  - [address] [place] [address] Phone: [phone] Fax: [phone]  [id number] - [place], [place] - [address] [place] [address] Phone: [phone] Fax: [phone]     Social Drivers of Health (SDOH) Social History: SDOH Screenings   Food Insecurity: No Food Insecurity (07/02/2024)  Housing: Low Risk  (07/02/2024)  Transportation Needs: No Transportation Needs (07/02/2024)  Utilities: Not At Risk (07/02/2024)  Alcohol Screen: Low Risk  (06/15/2022)  Depression (PHQ2-9): Low Risk  (11/23/2023)  Financial Resource Strain: Medium Risk (11/23/2023)  Physical Activity: Inactive (11/23/2023)  Social Connections: Moderately Isolated (11/23/2023)  Stress: Stress Concern Present (11/23/2023)  Tobacco Use: Medium Risk (06/26/2024)  Health Literacy: Adequate Health Literacy (11/23/2023)   SDOH Interventions:     Readmission Risk Interventions     No data to display

## 2024-07-03 NOTE — Progress Notes (Signed)
 Occupational Therapy Treatment Patient Details Name: Johnny Andrews MRN: [id number] DOB: 1961-04-28 Today's Date: 07/03/2024   History of present illness Johnny Andrews is a 63 y.o. male who [facility] to [facility]28[facility]24 with left heel stage IV calcaneal decubitus ulcer for I&D with wound vac application. PMHx: morbid obesity, OSA, T2DM, HTN, HLD, alcohol use, neuropathy, diabetic foot ulcer, anxiety, hypothyroidism, and OA.   OT comments  Pt has progressed well with OT and has completed all goals. Pt was able to engage in toileting tasks without assistance and engage in ADL activity out of bed/recliner for 5 minutes. Pt has progressed towards baseline abilities and does not indicate a continued need for skilled OT services in acute care. OT to sign off.       If plan is discharge home, recommend the following:  Assistance with cooking/housework;Other (comment)   Equipment Recommendations  Other (comment) (Bariatric RW)    Recommendations for Other Services      Precautions / Restrictions Precautions Precautions: Fall Recall of Precautions/Restrictions: Intact Restrictions Weight Bearing Restrictions Per Provider Order: Yes LLE Weight Bearing Per Provider Order: Partial weight bearing LLE Partial Weight Bearing Percentage or Pounds: WB through forefoot only with a walker Other Position/Activity Restrictions: Avoid pressure on the heel, ensure heel is elevated at all times.       Mobility Bed Mobility               General bed mobility comments: Pt greeted in recliner, returned to recliner    Transfers Overall transfer level: Needs assistance Equipment used: Rolling walker (2 wheels) Transfers: Sit to/from Stand Sit to Stand: Supervision           General transfer comment: Good recall of hand palcement as well as good control of rising and decent. Good navigation arounf furniture in room. Close supervision provided for safety.     Balance Overall balance  assessment: Needs assistance Sitting-balance support: No upper extremity supported, Feet supported Sitting balance-Leahy Scale: Fair     Standing balance support: Bilateral upper extremity supported, During functional activity, Reliant on assistive device for balance Standing balance-Leahy Scale: Poor Standing balance comment: Requires support from RW or BUE propping on counter for stabilization                           ADL either performed or assessed with clinical judgement   ADL Overall ADL's : Needs assistance/impaired     Grooming: Wash/dry hands;Supervision/safety;Standing Grooming Details (indicate cue type and reason): Pt completed grooming task at sink with BUE propping as Pt stated that is his typical routine                 Toilet Transfer: Supervision/safety;Regular Toilet;Rolling walker (2 wheels);Grab bars Toilet Transfer Details (indicate cue type and reason): Pt utilized grab bars, stated that he has a vanity and stable furniture he uses at home. Toileting- Clothing Manipulation and Hygiene: Independent Toileting - Clothing Manipulation Details (indicate cue type and reason): uses bidet at home. Pt completed toileting hygeine and clothing manipulation independently this session.            Extremity/Trunk Assessment Upper Extremity Assessment Upper Extremity Assessment: Overall WFL for tasks assessed            Vision       Perception     Praxis     Communication Communication Communication: No apparent difficulties   Cognition Arousal: Alert Behavior During Therapy: [facility] for tasks  assessed/performed Cognition: No apparent impairments                               Following commands: Intact        Cueing   Cueing Techniques: Verbal cues  Exercises      Shoulder Instructions       General Comments OT addressed all Pt reported concerns related to return home. Pt educated on energy conservation strategies and  importance of increasing activity tolerance. Pt educated on sitting to bathe and sponge bath until cleared for shower. VSS on RA    Pertinent Vitals/ Pain       Pain Assessment Pain Assessment: Faces Faces Pain Scale: Hurts a little bit Pain Location: L foot Pain Descriptors / Indicators: Guarding Pain Intervention(s): Limited activity within patient's tolerance, Monitored during session  Home Living                                          Prior Functioning/Environment              Frequency  Min 1X/week        Progress Toward Goals  OT Goals(current goals can now be found in the care plan section)  Progress towards OT goals: Goals met/education completed, patient discharged from OT  Acute Rehab OT Goals Patient Stated Goal: To go home OT Goal Formulation: With patient Time For Goal Achievement: 07/13/24 Potential to Achieve Goals: Good ADL Goals Pt Will Transfer to Toilet: with set-up;ambulating Additional ADL Goal #1: Pt to increase standing activity tolerance > 3 min during ADLs/mobility without need for seated rest break  Plan      Co-evaluation                 AM-PAC OT 6 Clicks Daily Activity     Outcome Measure   Help from another person eating meals?: None Help from another person taking care of personal grooming?: A Little Help from another person toileting, which includes using toliet, bedpan, or urinal?: A Little Help from another person bathing (including washing, rinsing, drying)?: A Little Help from another person to put on and taking off regular upper body clothing?: A Little Help from another person to put on and taking off regular lower body clothing?: A Little 6 Click Score: 19    End of Session Equipment Utilized During Treatment: Rolling walker (2 wheels)  OT Visit Diagnosis: Other abnormalities of gait and mobility (R26.89)   Activity Tolerance Patient tolerated treatment well   Patient Left in chair;with  call bell/phone within reach   Nurse Communication          Time: 8875-8866 OT Time Calculation (min): 9 min  Charges: OT General Charges $OT Visit: 1 Visit OT Treatments $Self Care/Home Management : 8-22 mins  Maurilio CROME, OTR/L.  [facility] Acute Rehabilitation  Office: [phone]   [name] Redmon 07/03/2024, 11:43 AM

## 2024-07-04 ENCOUNTER — Telehealth: Payer: Self-pay

## 2024-07-04 ENCOUNTER — Telehealth: Payer: Self-pay | Admitting: *Deleted

## 2024-07-04 NOTE — Transitions of Care (Post Inpatient/ED Visit) (Signed)
   07/04/2024  Name: Johnny Andrews MRN: [id number] DOB: August 18, 1960  Today's TOC FU Call Status: Today's TOC FU Call Status:: Unsuccessful Call (1st Attempt) Unsuccessful Call (1st Attempt) Date: 07/04/24  Attempted to reach the patient regarding the most recent Inpatient visit.  Left HIPAA compliant voice message   Follow Up Plan: Additional outreach attempts will be made to reach the patient to complete the Transitions of Care (Post Inpatient/ED visit) call.   Pls call/ message for questions,  Uzziel Russey Mckinney Nathyn Luiz, RN, BSN, CCRN Alumnus RN Care Manager  Transitions of Care  [facility] - [facility] [phone]: direct office

## 2024-07-04 NOTE — Telephone Encounter (Signed)
 Copied from CRM [id number]. Topic: General - Other >> Jul 04, 2024 10:49 AM Dedra NOVAK wrote: Reason for CRM: Pt returning call for population health nurse, Laine. Attempted to call and no answer. Pls call pt.

## 2024-07-05 ENCOUNTER — Telehealth: Payer: Self-pay | Admitting: *Deleted

## 2024-07-05 NOTE — Telephone Encounter (Signed)
 I dont believe this message was for us . We do not have anyone here in this office by this name nor do we have population nurse

## 2024-07-05 NOTE — Transitions of Care (Post Inpatient/ED Visit) (Signed)
 07/05/2024  Name: Johnny Andrews MRN: [id number] DOB: 1961-01-22  Today's TOC FU Call Status: Today's TOC FU Call Status:: Successful TOC FU Call Completed TOC FU Call Complete Date: 07/05/24  Patient's Name and Date of Birth confirmed. Name, DOB  Transition Care Management Follow-up Telephone Call Date of Discharge: 07/03/24 Discharge Facility: [facility]) Type of Discharge: Inpatient Admission Primary Inpatient Discharge Diagnosis:: Elective admission for (L) foot surgical I/D- osteomyelitis (L) foot How have you been since you were released from the hospital?: Better (I am doing fine; using the walker and don't need much help with anything.  My wife is here helping me if I were to need something.  I don't need any follow up calls-- I know to call the doctor if concerns come up) Any questions or concerns?: No  Items Reviewed: Did you receive and understand the discharge instructions provided?: Yes (thoroughly reviewed with patient who verbalizes good understanding of same) Medications obtained,verified, and reconciled?: Yes (Medications Reviewed) (Full medication reconciliation/ review completed; no concerns or discrepancies identified; confirmed patient obtained/ is taking all newly Rx'd medications as instructed; self-manages medications and denies questions/ concerns around medications today) Any new allergies since your discharge?: No Dietary orders reviewed?: Yes Type of Diet Ordered:: Diabetic; Heart Healthy Do you have support at home?: Yes People in Home [RPT]: spouse Name of Support/Comfort Primary Source: Reports independent in self-care activities; resides with supportive spouse- assists as/ if needed/ indicated  Medications Reviewed Today: Medications Reviewed Today     Reviewed by Kerra Guilfoil M, RN (Registered Nurse) on 07/05/24 at [phone]  Med List Status: <None>   Medication Order Taking? Sig Documenting Provider Last Dose Status Informant  acetaminophen   (TYLENOL ) 325 MG tablet [id number] Yes Take 1-2 tablets (325-650 mg total) by mouth every 6 (six) hours as needed. Arlice Reichert, MD  Active   ALPRAZolam  (XANAX ) 0.5 MG tablet [id number] Yes TAKE 1 TABLET BY MOUTH TWICE A DAY AS NEEDED FOR ANXIETY Sagardia, Emil Schanz, MD  Active Self, Pharmacy Records  amLODipine (NORVASC) 10 MG tablet [id number] Yes Take 1 tablet (10 mg total) by mouth daily. Dahal, Binaya, MD  Active   aspirin (BAYER ASPIRIN) 325 MG tablet [id number] Yes Take 1 tablet by mouth for 30 DAYS for blood clot prevention Dahal, Binaya, MD  Active   blood glucose meter kit and supplies [id number] Yes 1 each by Other route 2 (two) times daily. Purcell Emil Schanz, MD  Active Self, Pharmacy Records           Med Note [place], [place] A   Thu Aug 28, 2020  8:25 AM) sometimes  empagliflozin (JARDIANCE) 25 MG TABS tablet [id number] Yes Take 25 mg by mouth daily. [provider]  Active Self, Pharmacy Records  fenofibrate  micronized (LOFIBRA) 134 MG capsule [id number] Yes Take 134 mg by mouth daily. [provider]  Active Self, Pharmacy Records  glucose blood (ONE TOUCH ULTRA TEST) test strip [id number] Yes Two times a day for ULTRA 2 ONE TOUCH Sagardia, Emil Schanz, MD  Active Self, Pharmacy Records           Med Note [place], [place] A   Thu Aug 28, 2020  8:25 AM) sometimes  insulin  aspart (NOVOLOG) 100 UNIT/ML FlexPen [id number] Yes Inject 75 Units into the skin 2 (two) times daily before a meal. 30 units am and 50 units pm [provider]  Active Self, Pharmacy Records           Med Note [place], [place]  A   Wed Jul 25, 2019  1:48 PM) Inject 80 units bid  Insulin  Pen Needle (BD ULTRA-FINE PEN NEEDLES) 29G X 12.7MM MISC [id number] Yes USE TWO CHECK BLOOD SUGAR TWICE A DAY. APPOINTMENT NEEDED FOR FURTHER REFILLS Purcell Emil Schanz, MD  Active Self, Pharmacy Records           Med Note [place], [place] A   Thu Aug 28, 2020  8:26 AM) use  levofloxacin (LEVAQUIN) 750 MG  tablet [id number] Yes Take 1 tablet (750 mg total) by mouth daily. Overton Constance DASEN, MD  Active   levothyroxine  (SYNTHROID ) 137 MCG tablet [id number] Yes TAKE 2 TABLETS (274 MCG TOTAL) BY MOUTH DAILY BEFORE BREAKFAST. Purcell Emil Schanz, MD  Active Self, Pharmacy Records  metFORMIN (GLUCOPHAGE-XR) 500 MG 24 hr tablet [id number] Yes Take 1,000 mg by mouth 2 (two) times daily.  [provider]  Active Self, Pharmacy Records           Med Note [place], [facility] B   Thu Oct 09, 2015  9:21 AM) Received from: External Pharmacy  methocarbamol (ROBAXIN) 500 MG tablet [id number] Yes Take 1 tablet (500 mg total) by mouth every 6 (six) hours as needed for muscle spasms. Dahal, Binaya, MD  Active   metroNIDAZOLE (FLAGYL) 500 MG tablet [id number] Yes Take 1 tablet (500 mg total) by mouth every 12 (twelve) hours for 28 days. Overton Constance DASEN, MD  Active   Multiple Vitamin (MULTIVITAMIN PO) [id number] Yes Take 1 tablet by mouth daily at 12 noon. [provider]  Active Self, Pharmacy Records  olmesartan-hydrochlorothiazide [facility] HCT) 40-25 MG tablet [id number] Yes Take 1 tablet by mouth daily. Dahal, Binaya, MD  Active   Omega-3 Fatty Acids (FISH OIL PO) [id number] Yes Take 1 capsule by mouth daily at 12 noon. [provider]  Active Self, Pharmacy Records  OZEMPIC, 1 MG/DOSE, 4 MG/3ML SOPN [id number] Yes Inject 10 mg into the skin once a week. [provider]  Active Self, Pharmacy Records  Respiratory Therapy Supplies (ADULT MASK) MISC [id number] Yes 2 Devices by Does not apply route once. Jude Harden GAILS, MD  Active Self, Pharmacy Records           Med Note [place], [place] A   Thu Aug 28, 2020  8:27 AM) use  simvastatin  (ZOCOR ) 20 MG tablet [id number] Yes Take 20 mg by mouth daily. [provider]  Active Self, Pharmacy Records  topiramate  (TOPAMAX ) 50 MG tablet [id number] Yes TAKE 2 TABLETS BY MOUTH AT BEDTIME. Purcell Emil Schanz, MD  Active Self, Pharmacy Records  traZODone   (DESYREL ) 100 MG tablet [id number] Yes TAKE 1 TABLET BY MOUTH EVERYDAY AT BEDTIME Purcell Emil Schanz, MD  Active Self, Pharmacy Records  TRESIBA [facility] 200 UNIT/ML NELMA [id number] Yes INJECT 100 UNITS TWICE A DAY [provider]  Active Self, Pharmacy Records           Med Note [place], [place] A   Thu Jul 06, 2018 11:23 AM) Per patient inject 80 units bid           Home Care and Equipment/Supplies: Were Home Health Services Ordered?: Yes Name of Home Health Agency:: [facility]: referral accepted 07/03/24 by Darleene: [phone] Has Agency set up a time to come to your home?: No (Patient reports plans to contact agency as soon as we hang up today) EMR reviewed for Home Health Orders: Orders present/patient has not received call (refer to CM for follow-up) (declined TOC 30-day program enrollment; declines need for assistance  today in contacting [facility]) Any new equipment or medical supplies ordered?: No  Functional Questionnaire: Do you need assistance with bathing/showering or dressing?: No Do you need assistance with meal preparation?: No Do you need assistance with eating?: No Do you have difficulty maintaining continence: No Do you need assistance with getting out of bed/getting out of a chair/moving?: No Do you have difficulty managing or taking your medications?: No  Follow up appointments reviewed: PCP Follow-up appointment confirmed?: No (Patient declined care coordination outreach in real-time with scheduling care guide to schedule hospital follow up PCP appointment- states he will schedule on his own) MD Provider Line Number:[phone] Given: No (verified well-established with current PCP) Specialist Hospital Follow-up appointment confirmed?: Yes Date of Specialist follow-up appointment?: 07/09/24 Follow-Up Specialty Provider:: 07/09/24: Orthopedic provider: [facility]; 07/25/24: ID provider Do you need transportation to your follow-up appointment?:  No Do you understand care options if your condition(s) worsen?: Yes-patient verbalized understanding  SDOH Interventions Today    Flowsheet Row Most Recent Value  SDOH Interventions   Food Insecurity Interventions Intervention Not Indicated  Housing Interventions Intervention Not Indicated  Transportation Interventions Intervention Not Indicated  [drives self at baseline,  family and friends assisting post hospital discharge 07/03/24]  Utilities Interventions Intervention Not Indicated   See TOC assessment tabs for additional assessment/ TOC intervention information Provided education around common side effects of narcotic pain medicine; need to use pain medicine as prescribed/ as needed; safe use of opioid medication  Reinforced signs/ symptoms wound infection along with corresponding action plan  Reinforced blood sugar management at home; need to follow carbohydrate modified diet; use of long- acting insulin   Patient declines need for ongoing/ further care management/ coordination outreach; declines enrollment in 30-day TOC program- declines taking my direct phone number should needs/ concerns arise post-TOC call   Pls call/ message for questions,  Jevante Hollibaugh Mckinney Brandis Matsuura, RN, BSN, CCRN Alumnus RN Care Manager  Transitions of Care  [facility] - Population Health  [place] [phone]: direct office

## 2024-07-16 ENCOUNTER — Telehealth: Payer: Self-pay

## 2024-07-16 NOTE — Telephone Encounter (Signed)
 Copied from CRM #[id number]. Topic: Clinical - Medical Advice >> Jul 16, 2024  3:50 PM Johnny Andrews wrote: Reason for CRM: pt have been to hospital and he would like to look at the file and call to discuss pt discharged pt REFUSED Hospital  follow up due to him having to stay off of his foot, due to surgeries etc. I adv I coud not schedule virtual due to hosptial follow up. Please call pt back [phone]

## 2024-07-17 NOTE — Telephone Encounter (Signed)
 Please advise? Since patient does have medicare his virtual will not be covered

## 2024-07-17 NOTE — Telephone Encounter (Signed)
 We can see him in the office when it is convenient for him.  No need to rush.

## 2024-07-19 ENCOUNTER — Other Ambulatory Visit: Payer: Self-pay | Admitting: Emergency Medicine

## 2024-07-19 DIAGNOSIS — F418 Other specified anxiety disorders: Secondary | ICD-10-CM

## 2024-07-19 NOTE — Telephone Encounter (Signed)
 RN called to ask about a couple of the medications. One was the flagyl , it was only prescribed for 28 days. He states he is still having signs of infection and it is gangrene so he'd rather not take a chance with it. He denies any fever.   The second was the xanax . He states it is very difficult getting around on his foot and it is causing increased anxiety. He denies any thoughts of harming himself or others. States he does have increased worry about the outcome of all this.  He would like the dose increased if possible. He is not currently able to come to an appt. Please advise.

## 2024-07-19 NOTE — Telephone Encounter (Signed)
 Copied from CRM #[id number]. Topic: Clinical - Medication Refill >> Jul 19, 2024 10:17 AM Viola F wrote: Medication: Methocarbamol  (ROBAXIN ) 500 MG tablet [[id number]] MetroNIDAZOLE  (FLAGYL ) 500 MG tablet [[id number]] Olmesartan -hydrochlorothiazide  (BENICAR  HCT) 40-25 MG tablet [[id number]] Empagliflozin (JARDIANCE) 25 MG TABS tablet [[id number]] Increase the dosage on the ALPRAZolam  (XANAX ) 0.5 MG tablet [[id number]]   Has the patient contacted their pharmacy? Yes (Agent: If no, request that the patient contact the pharmacy for the refill. If patient does not wish to contact the pharmacy document the reason why and proceed with request.) (Agent: If yes, when and what did the pharmacy advise?)  This is the patient's preferred pharmacy:   [id number] - [address] [place] [address] Phone: [phone] Fax: [phone]   Is this the correct pharmacy for this prescription? Yes If no, delete pharmacy and type the correct one.   Has the prescription been filled recently? Yes  Is the patient out of the medication? No  Has the patient been seen for an appointment in the last year OR does the patient have an upcoming appointment? Yes  Can we respond through MyChart? No  Agent: Please be advised that Rx refills may take up to 3 business days. We ask that you follow-up with your pharmacy.

## 2024-07-19 NOTE — Telephone Encounter (Signed)
 Spoke with patient and he states that it it very very difficult for him to come in the office and he is needing a few medications refilled and I explained to him that since he has not been seen in office in over a year then more than likely getting his refills will not be guaranteed due this matter and I did explain that other options are limited as well since now that medicare no loner accepts virtual visits. I did inform the patient that I will talk with his provider to see about other options but explained to him to no be surprised if the only options is for him to come in office which he verbalized he understood

## 2024-07-19 NOTE — Telephone Encounter (Signed)
 Thank you :)

## 2024-07-20 MED ORDER — METRONIDAZOLE 500 MG PO TABS: 500.0000 mg | ORAL_TABLET | Freq: Two times a day (BID) | ORAL | 0 refills | Status: AC

## 2024-07-20 MED ORDER — EMPAGLIFLOZIN 25 MG PO TABS: 25.0000 mg | ORAL_TABLET | Freq: Every day | ORAL | 1 refills | Status: AC

## 2024-07-20 MED ORDER — ALPRAZOLAM 0.5 MG PO TABS: 0.5000 mg | ORAL_TABLET | Freq: Two times a day (BID) | ORAL | 1 refills | Status: AC | PRN

## 2024-07-20 MED ORDER — METHOCARBAMOL 500 MG PO TABS: 500.0000 mg | ORAL_TABLET | Freq: Four times a day (QID) | ORAL | 0 refills | Status: AC | PRN

## 2024-07-20 MED ORDER — OLMESARTAN MEDOXOMIL-HCTZ 40-25 MG PO TABS: 1.0000 | ORAL_TABLET | Freq: Every day | ORAL | 0 refills | Status: AC

## 2024-07-20 NOTE — Telephone Encounter (Signed)
 Ive explained to patient that more than likely he would have to be seen in order to get his medication unfortunately but Provider did state that there was no rush and he can come in at anytime he needed that's best convenient for him

## 2024-07-23 ENCOUNTER — Encounter (HOSPITAL_BASED_OUTPATIENT_CLINIC_OR_DEPARTMENT_OTHER): Admitting: Internal Medicine

## 2024-07-23 ENCOUNTER — Encounter (HOSPITAL_BASED_OUTPATIENT_CLINIC_OR_DEPARTMENT_OTHER): Attending: Internal Medicine | Admitting: Internal Medicine

## 2024-07-23 DIAGNOSIS — M86672 Other chronic osteomyelitis, left ankle and foot: Secondary | ICD-10-CM | POA: Diagnosis not present

## 2024-07-23 DIAGNOSIS — E11621 Type 2 diabetes mellitus with foot ulcer: Secondary | ICD-10-CM | POA: Diagnosis not present

## 2024-07-23 DIAGNOSIS — E1169 Type 2 diabetes mellitus with other specified complication: Secondary | ICD-10-CM | POA: Diagnosis present

## 2024-07-25 ENCOUNTER — Ambulatory Visit (INDEPENDENT_AMBULATORY_CARE_PROVIDER_SITE_OTHER): Admitting: Internal Medicine

## 2024-07-25 ENCOUNTER — Other Ambulatory Visit: Payer: Self-pay

## 2024-07-25 VITALS — BP 120/76 | HR 97 | Temp 98.5°F

## 2024-07-25 DIAGNOSIS — L089 Local infection of the skin and subcutaneous tissue, unspecified: Secondary | ICD-10-CM

## 2024-07-25 DIAGNOSIS — M869 Osteomyelitis, unspecified: Secondary | ICD-10-CM | POA: Diagnosis not present

## 2024-07-25 DIAGNOSIS — E11628 Type 2 diabetes mellitus with other skin complications: Secondary | ICD-10-CM

## 2024-07-25 NOTE — Patient Instructions (Signed)
 I have referred you to podiatry for ongoing diabetic foot care  Continue to stay off left heel to improve swelling   Finish 1 more week of levo/flagyl    See me in 4 weeks

## 2024-07-25 NOTE — Progress Notes (Signed)
 [facility]  Patient Active Problem List   Diagnosis Date Noted   Osteomyelitis of foot (HCC) 06/28/2024   Stage 3a chronic kidney disease (HCC) 01/11/2023   Acute cough 08/26/2022   Lower respiratory infection 08/26/2022   Plantar fasciitis, bilateral 06/01/2021   Heel pain, bilateral 05/30/2021   Claudication 07/08/2020   Chronic venous insufficiency 07/25/2019   Encounter for orthopedic follow-up care 08/14/2018   Carpal tunnel syndrome of right wrist 04/24/2018   Diabetic peripheral neuropathy (HCC) 04/24/2018   Morbid obesity (HCC) 01/27/2018   Osteoarthritis of left knee 12/16/2017   Osteoarthritis of right knee 12/16/2017   Family history of prostate cancer 03/06/2017   Pure hypercholesterolemia 09/13/2016   Lumbar degenerative disc disease 07/22/2016   Spinal stenosis of lumbar region 11/03/2015   Abnormality of gait 09/15/2015   Restrictive lung disease 05/05/2015   Impotence of organic origin 02/05/2013   Hypogonadism male 02/05/2013   Elevated PSA 11/30/2011   Chest congestion 07/20/2010   GOUT 04/17/2010   SMOKER 04/17/2010   Sleep apnea 08/22/2009   Hypothyroidism 09/19/2007   Dyslipidemia associated with type 2 diabetes mellitus (HCC) 09/19/2007   Situational anxiety 09/19/2007   Hypertension associated with diabetes (HCC) 09/19/2007   Osteoarthritis 09/19/2007      Subjective:    Patient ID: Johnny Andrews, male    DOB: June 07, 1961, 63 y.o.   MRN: [id number]  Chief Complaint  Patient presents with   Follow-up    HPI:  Johnny Andrews is a 63 y.o. male here for left calcaneal osteomyelitis f/u in setting of diabetes   He has been on levo/flagyl  Seeing ortho in f/u and also started to see wound care (first visit 3 days ago). He was given hard sole boot   He said and was told his wound is improving in size  No side effect from abx     Allergies  Allergen Reactions   Other     Dust mites       Outpatient Medications Prior to Visit  Medication Sig Dispense Refill   acetaminophen  (TYLENOL ) 325 MG tablet Take 1-2 tablets (325-650 mg total) by mouth every 6 (six) hours as needed. 60 tablet 0   ALPRAZolam  (XANAX ) 0.5 MG tablet Take 1 tablet (0.5 mg total) by mouth 2 (two) times daily as needed for anxiety. 30 tablet 1   amLODipine  (NORVASC ) 10 MG tablet Take 1 tablet (10 mg total) by mouth daily. 30 tablet 0   aspirin  (BAYER ASPIRIN ) 325 MG tablet Take 1 tablet by mouth for 30 DAYS for blood clot prevention 30 tablet 0   blood glucose meter kit and supplies 1 each by Other route 2 (two) times daily. 1 each 1   empagliflozin  (JARDIANCE ) 25 MG TABS tablet Take 1 tablet (25 mg total) by mouth daily. 30 tablet 1   fenofibrate  micronized (LOFIBRA) 134 MG capsule Take 134 mg by mouth daily.     glucose blood (ONE TOUCH ULTRA TEST) test strip Two times a day for ULTRA 2 ONE TOUCH 100 each 1   insulin  aspart (NOVOLOG ) 100 UNIT/ML FlexPen Inject 75 Units into the skin 2 (two) times daily before a meal. 30 units am and 50 units pm     Insulin  Pen Needle (BD ULTRA-FINE PEN NEEDLES) 29G X 12.7MM MISC USE TWO CHECK BLOOD SUGAR TWICE A DAY. APPOINTMENT NEEDED FOR FURTHER REFILLS 100 each 3   levofloxacin  (LEVAQUIN ) 750 MG tablet Take 1 tablet (  750 mg total) by mouth daily. 28 tablet 0   levothyroxine  (SYNTHROID ) 137 MCG tablet TAKE 2 TABLETS (274 MCG TOTAL) BY MOUTH DAILY BEFORE BREAKFAST. 180 tablet 3   metFORMIN (GLUCOPHAGE-XR) 500 MG 24 hr tablet Take 1,000 mg by mouth 2 (two) times daily.      methocarbamol  (ROBAXIN ) 500 MG tablet Take 1 tablet (500 mg total) by mouth every 6 (six) hours as needed for muscle spasms. 30 tablet 0   metroNIDAZOLE  (FLAGYL ) 500 MG tablet Take 1 tablet (500 mg total) by mouth every 12 (twelve) hours for 28 days. 56 tablet 0   Multiple Vitamin (MULTIVITAMIN PO) Take 1 tablet by mouth daily at 12 noon.     olmesartan -hydrochlorothiazide  (BENICAR  HCT) 40-25 MG tablet  Take 1 tablet by mouth daily. 30 tablet 0   Omega-3 Fatty Acids (FISH OIL PO) Take 1 capsule by mouth daily at 12 noon.     OZEMPIC, 1 MG/DOSE, 4 MG/3ML SOPN Inject 10 mg into the skin once a week.     Respiratory Therapy Supplies (ADULT MASK) MISC 2 Devices by Does not apply route once. 2 each 0   simvastatin  (ZOCOR ) 20 MG tablet Take 20 mg by mouth daily.     topiramate  (TOPAMAX ) 50 MG tablet TAKE 2 TABLETS BY MOUTH AT BEDTIME. 180 tablet 1   traZODone  (DESYREL ) 100 MG tablet TAKE 1 TABLET BY MOUTH EVERYDAY AT BEDTIME 90 tablet 1   TRESIBA  FLEXTOUCH 200 UNIT/ML SOPN INJECT 100 UNITS TWICE A DAY  6   No facility-administered medications prior to visit.     Social History   Socioeconomic History   Marital status: Married    Spouse name: Eugenio   Number of children: 2   Years of education: BS   Highest education level: Not on file  Occupational History   Occupation: Disabled/HVAC    Employer: [employer]  Tobacco Use   Smoking status: Former    Current packs/day: 0.00    Average packs/day: 0.5 packs/day for 30.0 years (15.0 ttl pk-yrs)    Types: Cigarettes    Start date: 55    Quit date: 2008    Years since quitting: 17.9   Smokeless tobacco: Never   Tobacco comments:    30 years at most off and on smoking 03/14/15  Vaping Use   Vaping status: Never Used  Substance and Sexual Activity   Alcohol use: Yes    Alcohol/week: 0.0 standard drinks of alcohol    Comment: 2 drinks per day   Drug use: Not Currently    Comment: Occasional Marijuana use   Sexual activity: Not on file  Other Topics Concern   Not on file  Social History Narrative   Originally from [place]. Has lived in [place], [place], [place], & [place] since 1993. 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 Currently has a dog & cats. Previously had a parrot (conure) 12 years ago for 1 year but in same house.  Has also had excessive exposure to pigeon feces. He has had very  brief exposure to asbestos around 2000. No hot tub exposure. He has inhaled chemical fumes/gases/refridgerants through his work.      Marital status: married x 26 years      Children: 2 children (19, 54); no grandchildren      Lives: Lives at home with his wife      Employment: Forensic Psychologist  Tobacco: none; quit in 2013; smoked x 1/2 ppd x 20 years      Alcohol:  10 drinks; usually 2 drinks per day and then skip two days.      Drugs:  None      Exercise: none; job is physically demanding      Right-handed.   1 cup caffeine daily.      Lives with wife and 1 dog/2025   Social Drivers of Health   Financial Resource Strain: Medium Risk (11/23/2023)   Overall Financial Resource Strain (CARDIA)    Difficulty of Paying Living Expenses: Somewhat hard  Food Insecurity: No Food Insecurity (07/05/2024)   Hunger Vital Sign    Worried About Running Out of Food in the Last Year: Never true    Ran Out of Food in the Last Year: Never true  Transportation Needs: No Transportation Needs (07/05/2024)   PRAPARE - Administrator, Civil Service (Medical): No    Lack of Transportation (Non-Medical): No  Physical Activity: Inactive (11/23/2023)   Exercise Vital Sign    Days of Exercise per Week: 0 days    Minutes of Exercise per Session: 0 min  Stress: Stress Concern Present (11/23/2023)   [employer] of Occupational Health - Occupational Stress Questionnaire    Feeling of Stress : To some extent  Social Connections: Moderately Isolated (11/23/2023)   Social Connection and Isolation Panel    Frequency of Communication with Friends and Family: More than three times a week    Frequency of Social Gatherings with Friends and Family: Once a week    Attends Religious Services: Never    Database Administrator or Organizations: No    Attends Banker Meetings: Never    Marital Status: Married  Catering Manager Violence: Not At Risk (07/05/2024)   Humiliation, Afraid,  Rape, and Kick questionnaire    Fear of Current or Ex-Partner: No    Emotionally Abused: No    Physically Abused: No    Sexually Abused: No      Review of Systems    All other ros negative  Objective:    BP 120/76   Pulse 97   Temp 98.5 F (36.9 C) (Oral)   SpO2 90%  Nursing note and vital signs reviewed.  Physical Exam     General/constitutional: no distress, pleasant; in wheel chair HEENT: Normocephalic, PER, Conj Clear, EOMI, Oropharynx clear Neck supple CV: rrr no mrg Lungs: clear to auscultation, normal respiratory effort Abd: Soft, Nontender Ext: no edema Skin/msk Suture still in; wound seem clean       Labs: Lab Results  Component Value Date   WBC 12.0 (H) 07/02/2024   HGB 12.1 (L) 07/02/2024   HCT 38.5 (L) 07/02/2024   MCV 89.3 07/02/2024   PLT 307 07/02/2024   Last metabolic panel Lab Results  Component Value Date   GLUCOSE 189 (H) 07/02/2024   NA 134 (L) 07/02/2024   K 3.8 07/02/2024   CL 103 07/02/2024   CO2 24 07/02/2024   BUN 21 07/02/2024   CREATININE 1.09 07/02/2024   GFRNONAA >60 07/02/2024   CALCIUM 8.5 (L) 07/02/2024   PROT 7.2 06/28/2024   ALBUMIN 3.0 (L) 06/28/2024   LABGLOB 2.8 07/08/2020   AGRATIO 1.4 07/08/2020   BILITOT 0.4 06/28/2024   ALKPHOS 57 06/28/2024   AST 20 06/28/2024   ALT 23 06/28/2024   ANIONGAP 7 07/02/2024   Lab Results  Component Value Date   CRP 3.2 11/04/2015  Micro:  Serology:  Imaging:  Assessment & Plan:   Problem List Items Addressed This Visit   None Visit Diagnoses       Diabetic foot infection (HCC)    -  Primary   Relevant Orders   Ambulatory referral to Podiatry     Osteomyelitis, unspecified site, unspecified type (HCC)       Relevant Orders   Ambulatory referral to Podiatry   CBC w/Diff   COMPLETE METABOLIC PANEL WITHOUT GFR   C-reactive protein         No orders of the defined types were placed in this encounter.    63 yo male dm2, obesity, neuropathy,  admitted with left calcaneal om, here for f/u   Patient had 2 months ulcer on calcaneus No fever, chill Seen ortho outpatient and recommend to have debridement Admitted 06/28/24 Wbc 15 Started on bsAbx and I&D same day Operative cx - morganella (S cipro/bactrim ), proteus mirabilis (S cipro, bactrim , amp-sulb, ceftriaxone ; I cefazolin ), gpc in pairs, mixed anaerobic flora, klebsiella oxytoca (S cipro, bactrim ) Gpc in pairs never grew but comments of mixed anaerobic flora   Discharging on 4 weeks of levo/flagyl   --------- 07/25/24 id assessment Wound improving in size; doesn't appear grossly infected I am cautiously optimistic that the I&D and abx are doing its job, but I did discuss with patient diabetic foot calcaneal infection usually don't heal well and end up with amputation  He has skin compromize on the left 2nd and 5th toe that don't look infected and also the sole forefoot on the right. I have referred you to podiatry for ongoing diabetic foot care.  F/u wound care/ortho otherwise as arranged with them  Finish abx in 1 week and will follow up 4-6 weeks and then up to 3 months off abx to monitor for relapse  Labs today     Follow-up: Return in about 4 weeks (around 08/22/2024).      Constance ONEIDA Passer, MD [facility] 07/25/2024, 9:10 AM

## 2024-07-26 LAB — COMPLETE METABOLIC PANEL WITHOUT GFR
AG Ratio: 1.1 (calc) (ref 1.0–2.5)
ALT: 9 U/L (ref 9–46)
AST: 12 U/L (ref 10–35)
Albumin: 3.5 g/dL — ABNORMAL LOW (ref 3.6–5.1)
Alkaline phosphatase (APISO): 52 U/L (ref 35–144)
BUN: 25 mg/dL (ref 7–25)
CO2: 28 mmol/L (ref 20–32)
Calcium: 9.1 mg/dL (ref 8.6–10.3)
Chloride: 100 mmol/L (ref 98–110)
Creat: 1.24 mg/dL (ref 0.70–1.35)
Globulin: 3.1 g/dL (ref 1.9–3.7)
Glucose, Bld: 176 mg/dL — ABNORMAL HIGH (ref 65–99)
Potassium: 4.5 mmol/L (ref 3.5–5.3)
Sodium: 137 mmol/L (ref 135–146)
Total Bilirubin: 0.4 mg/dL (ref 0.2–1.2)
Total Protein: 6.6 g/dL (ref 6.1–8.1)

## 2024-07-26 LAB — CBC WITH DIFFERENTIAL/PLATELET
Absolute Lymphocytes: 1193 {cells}/uL (ref 850–3900)
Absolute Monocytes: 1260 {cells}/uL — ABNORMAL HIGH (ref 200–950)
Basophils Absolute: 54 {cells}/uL (ref 0–200)
Basophils Relative: 0.4 %
Eosinophils Absolute: 389 {cells}/uL (ref 15–500)
Eosinophils Relative: 2.9 %
HCT: 41.6 % (ref 39.4–51.1)
Hemoglobin: 13.3 g/dL (ref 13.2–17.1)
MCH: 27.8 pg (ref 27.0–33.0)
MCHC: 32 g/dL (ref 31.6–35.4)
MCV: 86.8 fL (ref 81.4–101.7)
MPV: 9.8 fL (ref 7.5–12.5)
Monocytes Relative: 9.4 %
Neutro Abs: 10506 {cells}/uL — ABNORMAL HIGH (ref 1500–7800)
Neutrophils Relative %: 78.4 %
Platelets: 335 Thousand/uL (ref 140–400)
RBC: 4.79 Million/uL (ref 4.20–5.80)
RDW: 13.8 % (ref 11.0–15.0)
Total Lymphocyte: 8.9 %
WBC: 13.4 Thousand/uL — ABNORMAL HIGH (ref 3.8–10.8)

## 2024-07-26 LAB — C-REACTIVE PROTEIN: CRP: 61.5 mg/L — ABNORMAL HIGH (ref <8.0)

## 2024-07-30 ENCOUNTER — Encounter (HOSPITAL_BASED_OUTPATIENT_CLINIC_OR_DEPARTMENT_OTHER): Admitting: Internal Medicine

## 2024-07-30 DIAGNOSIS — L97528 Non-pressure chronic ulcer of other part of left foot with other specified severity: Secondary | ICD-10-CM

## 2024-07-30 DIAGNOSIS — E1169 Type 2 diabetes mellitus with other specified complication: Secondary | ICD-10-CM | POA: Diagnosis not present

## 2024-07-30 DIAGNOSIS — E11621 Type 2 diabetes mellitus with foot ulcer: Secondary | ICD-10-CM | POA: Diagnosis not present

## 2024-07-30 DIAGNOSIS — S90812A Abrasion, left foot, initial encounter: Secondary | ICD-10-CM

## 2024-08-01 ENCOUNTER — Ambulatory Visit

## 2024-08-01 ENCOUNTER — Ambulatory Visit: Admitting: Podiatry

## 2024-08-01 ENCOUNTER — Encounter: Payer: Self-pay | Admitting: Podiatry

## 2024-08-01 DIAGNOSIS — E08621 Diabetes mellitus due to underlying condition with foot ulcer: Secondary | ICD-10-CM | POA: Diagnosis not present

## 2024-08-01 DIAGNOSIS — L97423 Non-pressure chronic ulcer of left heel and midfoot with necrosis of muscle: Secondary | ICD-10-CM | POA: Diagnosis not present

## 2024-08-04 ENCOUNTER — Ambulatory Visit (HOSPITAL_BASED_OUTPATIENT_CLINIC_OR_DEPARTMENT_OTHER)
Admission: RE | Admit: 2024-08-04 | Discharge: 2024-08-04 | Disposition: A | Discharge status: Home / Self Care | Source: Ambulatory Visit | Attending: Podiatry | Admitting: Podiatry

## 2024-08-04 DIAGNOSIS — E11621 Type 2 diabetes mellitus with foot ulcer: Secondary | ICD-10-CM | POA: Insufficient documentation

## 2024-08-04 DIAGNOSIS — L97423 Non-pressure chronic ulcer of left heel and midfoot with necrosis of muscle: Secondary | ICD-10-CM | POA: Diagnosis present

## 2024-08-04 DIAGNOSIS — E08621 Diabetes mellitus due to underlying condition with foot ulcer: Secondary | ICD-10-CM | POA: Insufficient documentation

## 2024-08-13 ENCOUNTER — Encounter (HOSPITAL_BASED_OUTPATIENT_CLINIC_OR_DEPARTMENT_OTHER): Admitting: Internal Medicine

## 2024-08-14 ENCOUNTER — Encounter (HOSPITAL_BASED_OUTPATIENT_CLINIC_OR_DEPARTMENT_OTHER): Admitting: Internal Medicine

## 2024-08-14 NOTE — Progress Notes (Signed)
 "  Chief Complaint  Patient presents with   Wound Check    Pt is here due to wound on the heel of the left foot and second toe, states he has been dealing with this for quite sometime, he has a lot of questions regarding the next steps, he is hoping he does not need a foot amputation, stated he was not able to stand for x-rays thinks he needs an MRI instead.    HPI: 63 y.o. male PMHx T2DM, last A1c was 7.3 on 06/28/2024, chronic heel ulcer presenting for second opinion.  Past Medical History:  Diagnosis Date   ALCOHOL USE 10/24/2008   ANXIETY 09/19/2007   Cough 05/22/2008   Diabetes mellitus without complication (HCC)    Phreesia 06/02/2020   DIABETES MELLITUS, TYPE II 09/19/2007   External thrombosed hemorrhoids 08/23/2008   GOUT 04/17/2010   HYPERLIPIDEMIA 09/19/2007   HYPERTENSION 09/19/2007   Hypertension    Phreesia 06/02/2020   HYPOTHYROIDISM 09/19/2007   Leg pain    OSTEOARTHRITIS 09/19/2007   OTHER DISEASES OF LUNG NOT ELSEWHERE CLASSIFIED 07/20/2010   Rash and other nonspecific skin eruption 09/19/2007   Restrictive lung disease    SCROTAL ABSCESS 05/27/2010   Sebaceous cyst 08/01/2008   SLEEP APNEA 08/22/2009   Sleep apnea    Phreesia 06/02/2020   SMOKER 04/17/2010   Thyroid  disease    Phreesia 06/02/2020    Past Surgical History:  Procedure Laterality Date   APPLICATION OF WOUND VAC Left 06/28/2024   Procedure: APPLICATION, WOUND VAC;  Surgeon: Elsa Lonni SAUNDERS, MD;  Location: [facility] OR;  Service: Orthopedics;  Laterality: Left;   COLONOSCOPY     INCISION AND DRAINAGE OF WOUND Left 06/28/2024   Procedure: IRRIGATION AND DEBRIDEMENT WOUND;  Surgeon: Elsa Lonni SAUNDERS, MD;  Location: [facility] OR;  Service: Orthopedics;  Laterality: Left;   NASAL SEPTUM SURGERY      Allergies[1]   LT heel 08/01/2024   Physical Exam: General: The patient is alert and oriented x3 in no acute distress.  Dermatology: Chronic ulcer left posterior heel extending into the deep  tissues.    Vascular: Clinically no concern for vascular compromise.  Pulses palpable  Neurological: Light touch and protective threshold diminished  Musculoskeletal Exam: No pedal deformities noted  Radiographic Exam:  Unable to stand for x-rays today in our office.  Assessment/Plan of Care: 1.   -Patient evaluated.  Imaging and chart reviewed -Order placed for home health dressing changes to the left heel  Special Instructions: LT heel dressing changes 3x/week: -cleanse with normal saline -pack wound with saline gauze -dress w/ heel cusion protector, abd, large kerlex, ace wrap        -Currently being managed at the wound care center as well as with infectious disease and Dr. Lonni Elsa, [facility].  Continue -I did go ahead and order an MRI of the left heel.  I do not see any recent imaging.  Patient requested MRI.  Agree -Will plan to contact the patient after MRI results are available to review and discuss further treatment options and recommendations -Return to clinic PRN    Thresa EMERSON Sar, DPM [facility]  Dr. Thresa EMERSON Sar, DPM    [address], [place] [address]  Office [phone]  Fax [phone]        [1]  Allergies Allergen Reactions   Other     Dust mites   "

## 2024-08-20 LAB — OPHTHALMOLOGY REPORT-SCANNED

## 2024-08-22 ENCOUNTER — Ambulatory Visit: Admitting: Internal Medicine

## 2024-08-27 ENCOUNTER — Telehealth: Payer: Self-pay | Admitting: Lab

## 2024-08-27 NOTE — Telephone Encounter (Signed)
 Patient states have called many times with no return call for MRI results and recommendations please review and advise.

## 2024-09-03 ENCOUNTER — Ambulatory Visit: Admitting: Internal Medicine

## 2024-09-03 ENCOUNTER — Other Ambulatory Visit: Payer: Self-pay

## 2024-09-03 VITALS — BP 154/89 | HR 75 | Temp 98.3°F | Ht 75.0 in

## 2024-09-03 DIAGNOSIS — M86372 Chronic multifocal osteomyelitis, left ankle and foot: Secondary | ICD-10-CM

## 2024-09-03 DIAGNOSIS — E1169 Type 2 diabetes mellitus with other specified complication: Secondary | ICD-10-CM | POA: Diagnosis not present

## 2024-09-03 DIAGNOSIS — L97529 Non-pressure chronic ulcer of other part of left foot with unspecified severity: Secondary | ICD-10-CM | POA: Diagnosis not present

## 2024-09-03 DIAGNOSIS — E11621 Type 2 diabetes mellitus with foot ulcer: Secondary | ICD-10-CM | POA: Diagnosis not present

## 2024-09-03 NOTE — Progress Notes (Signed)
 "       [facility]  Patient Active Problem List   Diagnosis Date Noted   Osteomyelitis of foot (HCC) 06/28/2024   Stage 3a chronic kidney disease (HCC) 01/11/2023   Acute cough 08/26/2022   Lower respiratory infection 08/26/2022   Plantar fasciitis, bilateral 06/01/2021   Heel pain, bilateral 05/30/2021   Claudication 07/08/2020   Chronic venous insufficiency 07/25/2019   Encounter for orthopedic follow-up care 08/14/2018   Carpal tunnel syndrome of right wrist 04/24/2018   Diabetic peripheral neuropathy (HCC) 04/24/2018   Morbid obesity (HCC) 01/27/2018   Osteoarthritis of left knee 12/16/2017   Osteoarthritis of right knee 12/16/2017   Family history of prostate cancer 03/06/2017   Pure hypercholesterolemia 09/13/2016   Lumbar degenerative disc disease 07/22/2016   Spinal stenosis of lumbar region 11/03/2015   Abnormality of gait 09/15/2015   Restrictive lung disease 05/05/2015   Impotence of organic origin 02/05/2013   Hypogonadism male 02/05/2013   Elevated PSA 11/30/2011   Chest congestion 07/20/2010   GOUT 04/17/2010   SMOKER 04/17/2010   Sleep apnea 08/22/2009   Hypothyroidism 09/19/2007   Dyslipidemia associated with type 2 diabetes mellitus (HCC) 09/19/2007   Situational anxiety 09/19/2007   Hypertension associated with diabetes (HCC) 09/19/2007   Osteoarthritis 09/19/2007      Subjective:    Patient ID: Johnny Andrews, male    DOB: August 08, 1961, 64 y.o.   MRN: [id number]  Chief Complaint  Patient presents with   Follow-up    4 week f/u    HPI:  Johnny Andrews is a 64 y.o. male here for left calcaneal osteomyelitis f/u in setting of diabetes   He has been on levo/flagyl  Seeing ortho in f/u and also started to see wound care (first visit 3 days ago). He was given hard sole boot   He said and was told his wound is improving in size  No side effect from abx   --------- 09/03/24 id clinic f/u Patient has only been  seeing podiatry. He had graduated from wound clinic He finished antibiotics round for dfo on 08/01/2024 He had a podiatry ordered mri foot on 12/20 His wound had completely healed and he has no sign of infection grossly on the left calcaneal area No fever, chill Feels well   Podiatry had discussed with him about repeat surgery based on mri finding from 08/04/24  He is not on any antibiotics at this time      Allergies  Allergen Reactions   Other     Dust mites      Outpatient Medications Prior to Visit  Medication Sig Dispense Refill   acetaminophen  (TYLENOL ) 325 MG tablet Take 1-2 tablets (325-650 mg total) by mouth every 6 (six) hours as needed. 60 tablet 0   ALPRAZolam  (XANAX ) 0.5 MG tablet Take 1 tablet (0.5 mg total) by mouth 2 (two) times daily as needed for anxiety. 30 tablet 1   amLODipine  (NORVASC ) 10 MG tablet Take 1 tablet (10 mg total) by mouth daily. 30 tablet 0   blood glucose meter kit and supplies 1 each by Other route 2 (two) times daily. 1 each 1   fenofibrate  micronized (LOFIBRA) 134 MG capsule Take 134 mg by mouth daily.     glucose blood (ONE TOUCH ULTRA TEST) test strip Two times a day for ULTRA 2 ONE TOUCH 100 each 1   insulin  aspart (NOVOLOG ) 100 UNIT/ML FlexPen Inject 75 Units into the skin 2 (two) times daily before  a meal. 30 units am and 50 units pm     Insulin  Pen Needle (BD ULTRA-FINE PEN NEEDLES) 29G X 12.7MM MISC USE TWO CHECK BLOOD SUGAR TWICE A DAY. APPOINTMENT NEEDED FOR FURTHER REFILLS 100 each 3   levofloxacin  (LEVAQUIN ) 750 MG tablet Take 1 tablet (750 mg total) by mouth daily. 28 tablet 0   levothyroxine  (SYNTHROID ) 137 MCG tablet TAKE 2 TABLETS (274 MCG TOTAL) BY MOUTH DAILY BEFORE BREAKFAST. 180 tablet 3   metFORMIN (GLUCOPHAGE-XR) 500 MG 24 hr tablet Take 1,000 mg by mouth 2 (two) times daily.      methocarbamol  (ROBAXIN ) 500 MG tablet Take 1 tablet (500 mg total) by mouth every 6 (six) hours as needed for muscle spasms. 30 tablet 0    Multiple Vitamin (MULTIVITAMIN PO) Take 1 tablet by mouth daily at 12 noon.     olmesartan -hydrochlorothiazide  (BENICAR  HCT) 40-25 MG tablet Take 1 tablet by mouth daily. 30 tablet 0   Omega-3 Fatty Acids (FISH OIL PO) Take 1 capsule by mouth daily at 12 noon.     Respiratory Therapy Supplies (ADULT MASK) MISC 2 Devices by Does not apply route once. 2 each 0   simvastatin  (ZOCOR ) 20 MG tablet Take 20 mg by mouth daily.     topiramate  (TOPAMAX ) 50 MG tablet TAKE 2 TABLETS BY MOUTH AT BEDTIME. 180 tablet 1   traZODone  (DESYREL ) 100 MG tablet TAKE 1 TABLET BY MOUTH EVERYDAY AT BEDTIME 90 tablet 1   TRESIBA  FLEXTOUCH 200 UNIT/ML SOPN INJECT 100 UNITS TWICE A DAY  6   aspirin  (BAYER ASPIRIN ) 325 MG tablet Take 1 tablet by mouth for 30 DAYS for blood clot prevention (Patient not taking: Reported on 09/03/2024) 30 tablet 0   empagliflozin  (JARDIANCE ) 25 MG TABS tablet Take 1 tablet (25 mg total) by mouth daily. (Patient not taking: Reported on 09/03/2024) 30 tablet 1   OZEMPIC, 1 MG/DOSE, 4 MG/3ML SOPN Inject 10 mg into the skin once a week. (Patient not taking: Reported on 09/03/2024)     No facility-administered medications prior to visit.     Social History   Socioeconomic History   Marital status: Married    Spouse name: Eugenio   Number of children: 2   Years of education: BS   Highest education level: Not on file  Occupational History   Occupation: Disabled/HVAC    Employer: [employer]  Tobacco Use   Smoking status: Former    Current packs/day: 0.00    Average packs/day: 0.5 packs/day for 30.0 years (15.0 ttl pk-yrs)    Types: Cigarettes    Start date: 43    Quit date: 2008    Years since quitting: 18.0   Smokeless tobacco: Never   Tobacco comments:    30 years at most off and on smoking 03/14/15  Vaping Use   Vaping status: Never Used  Substance and Sexual Activity   Alcohol use: Yes    Alcohol/week: 0.0 standard drinks of alcohol    Comment: 2 drinks per day   Drug use:  Not Currently    Comment: Occasional Marijuana use   Sexual activity: Not on file  Other Topics Concern   Not on file  Social History Narrative   Originally from [place]. Has lived in [place], [place], [place], & [place] since 1993. 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 Currently has a dog & cats. Previously had a parrot (conure) 12 years ago for  1 year but in same house.  Has also had excessive exposure to pigeon feces. He has had very brief exposure to asbestos around 2000. No hot tub exposure. He has inhaled chemical fumes/gases/refridgerants through his work.      Marital status: married x 26 years      Children: 2 children (19, 64); no grandchildren      Lives: Lives at home with his wife      Employment: [employer]      Tobacco: none; quit in 2013; smoked x 1/2 ppd x 20 years      Alcohol:  10 drinks; usually 2 drinks per day and then skip two days.      Drugs:  None      Exercise: none; job is physically demanding      Right-handed.   1 cup caffeine daily.      Lives with wife and 1 dog/2025   Social Drivers of Health   Tobacco Use: Medium Risk (08/01/2024)   Patient History    Smoking Tobacco Use: Former    Smokeless Tobacco Use: Never    Passive Exposure: Not on file  Financial Resource Strain: Medium Risk (11/23/2023)   Overall Financial Resource Strain (CARDIA)    Difficulty of Paying Living Expenses: Somewhat hard  Food Insecurity: No Food Insecurity (07/05/2024)   Epic    Worried About Programme Researcher, Broadcasting/film/video in the Last Year: Never true    Ran Out of Food in the Last Year: Never true  Transportation Needs: No Transportation Needs (07/05/2024)   Epic    Lack of Transportation (Medical): No    Lack of Transportation (Non-Medical): No  Physical Activity: Inactive (11/23/2023)   Exercise Vital Sign    Days of Exercise per Week: 0 days    Minutes of Exercise per Session: 0 min  Stress: Stress Concern Present (11/23/2023)    [employer] of Occupational Health - Occupational Stress Questionnaire    Feeling of Stress : To some extent  Social Connections: Moderately Isolated (11/23/2023)   Social Connection and Isolation Panel    Frequency of Communication with Friends and Family: More than three times a week    Frequency of Social Gatherings with Friends and Family: Once a week    Attends Religious Services: Never    Database Administrator or Organizations: No    Attends Banker Meetings: Never    Marital Status: Married  Catering Manager Violence: Not At Risk (07/05/2024)   Epic    Fear of Current or Ex-Partner: No    Emotionally Abused: No    Physically Abused: No    Sexually Abused: No  Depression (PHQ2-9): Low Risk (07/05/2024)   Depression (PHQ2-9)    PHQ-2 Score: 0  Alcohol Screen: Low Risk (06/15/2022)   Alcohol Screen    Last Alcohol Screening Score (AUDIT): 4  Housing: Unknown (07/05/2024)   Epic    Unable to Pay for Housing in the Last Year: No    Number of Times Moved in the Last Year: Not on file    Homeless in the Last Year: No  Utilities: Not At Risk (07/05/2024)   Epic    Threatened with loss of utilities: No  Health Literacy: Adequate Health Literacy (11/23/2023)   B1300 Health Literacy    Frequency of need for help with medical instructions: Never      Review of Systems    All other ros negative  Objective:    There were  no vitals taken for this visit. Nursing note and vital signs reviewed.  Physical Exam     General/constitutional: no distress, pleasant; in wheel chair HEENT: Normocephalic, PER, Conj Clear, EOMI, Oropharynx clear Neck supple CV: rrr no mrg Lungs: clear to auscultation, normal respiratory effort Abd: Soft, Nontender Ext: no edema Skin/msk Suture still in; wound seem clean     09/03/24 Left foot looks great no sign of active cellulitis or ulcer    Labs: Lab Results  Component Value Date   WBC 13.4 (H) 07/25/2024   HGB  13.3 07/25/2024   HCT 41.6 07/25/2024   MCV 86.8 07/25/2024   PLT 335 07/25/2024   Last metabolic panel Lab Results  Component Value Date   GLUCOSE 176 (H) 07/25/2024   NA 137 07/25/2024   K 4.5 07/25/2024   CL 100 07/25/2024   CO2 28 07/25/2024   BUN 25 07/25/2024   CREATININE 1.24 07/25/2024   GFRNONAA >60 07/02/2024   CALCIUM 9.1 07/25/2024   PROT 6.6 07/25/2024   ALBUMIN 3.0 (L) 06/28/2024   LABGLOB 2.8 07/08/2020   AGRATIO 1.4 07/08/2020   BILITOT 0.4 07/25/2024   ALKPHOS 57 06/28/2024   AST 12 07/25/2024   ALT 9 07/25/2024   ANIONGAP 7 07/02/2024   Lab Results  Component Value Date   CRP 61.5 (H) 07/25/2024     Micro:  Serology:  Imaging: Reviewed  08/04/24 mri left foot 1. Skin wound along the lateral margin of the heel with a small underlying fluid collection consistent with an abscess. There is osteomyelitis in the posterior and lateral sides of the calcaneus. 2. Diffuse fatty atrophy of intrinsic musculature of the foot consistent with diabetic myopathy.   Assessment & Plan:   Problem List Items Addressed This Visit   None Visit Diagnoses       Diabetic foot ulcer with osteomyelitis (HCC)    -  Primary   Relevant Orders   C-reactive protein   Sedimentation rate   CBC   COMPLETE METABOLIC PANEL WITHOUT GFR          No orders of the defined types were placed in this encounter.    64 yo male dm2, obesity, neuropathy, admitted with left calcaneal om, here for f/u   Patient had 2 months ulcer on calcaneus No fever, chill Seen ortho outpatient and recommend to have debridement Admitted 06/28/24 Wbc 15 Started on bsAbx and I&D same day Operative cx - morganella (S cipro/bactrim ), proteus mirabilis (S cipro, bactrim , amp-sulb, ceftriaxone ; I cefazolin ), gpc in pairs, mixed anaerobic flora, klebsiella oxytoca (S cipro, bactrim ) Gpc in pairs never grew but comments of mixed anaerobic flora   Discharging on 4 weeks of  levo/flagyl   --------- 07/25/24 id assessment Wound improving in size; doesn't appear grossly infected I am cautiously optimistic that the I&D and abx are doing its job, but I did discuss with patient diabetic foot calcaneal infection usually don't heal well and end up with amputation  He has skin compromize on the left 2nd and 5th toe that don't look infected and also the sole forefoot on the right. I have referred you to podiatry for ongoing diabetic foot care.  F/u wound care/ortho otherwise as arranged with them  Finish abx in 1 week and will follow up 4-6 weeks and then up to 3 months off abx to monitor for relapse  Labs today  ----------- 09/03/24 id clinic assessment Patient finished abx course for his calcaneal om 08/01/24 He had an mri of his  foot ordered immediately after the treatment was done and I do not know how to interpret that  From the look of his foot and how he clinically feels, I would continue to observe and trend esr/crp for the next 2 months  If anything is active in terms of infection it would be very mild and there is no urgent indication for surgery  If esr/crp worsens and an ulcer redeveloped and progression of bone erosion based on imaging then reasonable to consider I&D for targeted therapy   Follow up 6 weeks  Chart forwarded to dr Artist of podiatry   Follow-up: No follow-ups on file.      Constance ONEIDA Passer, MD [facility] 09/03/2024, 2:31 PM  "

## 2024-09-03 NOTE — Patient Instructions (Signed)
 I am not sure at this time that you you have active infection  Mri can't be interpreted by itself as still showing infection. Most of the changes likely could be normal evolution of previously treated infection   Given how things look, let's monitor for the next couple months   I'll let podiatry know my thoughts   Labs today  See me in 6 weeks

## 2024-09-04 LAB — COMPLETE METABOLIC PANEL WITHOUT GFR
AG Ratio: 1.3 (calc) (ref 1.0–2.5)
ALT: 13 U/L (ref 9–46)
AST: 11 U/L (ref 10–35)
Albumin: 4 g/dL (ref 3.6–5.1)
Alkaline phosphatase (APISO): 64 U/L (ref 35–144)
BUN/Creatinine Ratio: 28 (calc) — ABNORMAL HIGH (ref 6–22)
BUN: 30 mg/dL — ABNORMAL HIGH (ref 7–25)
CO2: 29 mmol/L (ref 20–32)
Calcium: 9.5 mg/dL (ref 8.6–10.3)
Chloride: 100 mmol/L (ref 98–110)
Creat: 1.06 mg/dL (ref 0.70–1.35)
Globulin: 3.2 g/dL (ref 1.9–3.7)
Glucose, Bld: 95 mg/dL (ref 65–99)
Potassium: 4.3 mmol/L (ref 3.5–5.3)
Sodium: 138 mmol/L (ref 135–146)
Total Bilirubin: 0.3 mg/dL (ref 0.2–1.2)
Total Protein: 7.2 g/dL (ref 6.1–8.1)

## 2024-09-04 LAB — CBC
HCT: 41.9 % (ref 39.4–51.1)
Hemoglobin: 13.2 g/dL (ref 13.2–17.1)
MCH: 27 pg (ref 27.0–33.0)
MCHC: 31.5 g/dL — ABNORMAL LOW (ref 31.6–35.4)
MCV: 85.7 fL (ref 81.4–101.7)
MPV: 10.1 fL (ref 7.5–12.5)
Platelets: 389 Thousand/uL (ref 140–400)
RBC: 4.89 Million/uL (ref 4.20–5.80)
RDW: 14.1 % (ref 11.0–15.0)
WBC: 13.1 Thousand/uL — ABNORMAL HIGH (ref 3.8–10.8)

## 2024-09-04 LAB — SEDIMENTATION RATE: Sed Rate: 34 mm/h — ABNORMAL HIGH (ref 0–20)

## 2024-09-04 LAB — C-REACTIVE PROTEIN: CRP: 5.5 mg/L

## 2024-09-05 ENCOUNTER — Encounter: Payer: Self-pay | Admitting: Podiatry

## 2024-09-05 ENCOUNTER — Ambulatory Visit: Admitting: Podiatry

## 2024-09-05 VITALS — Ht 75.0 in | Wt >= 6400 oz

## 2024-09-05 DIAGNOSIS — L97423 Non-pressure chronic ulcer of left heel and midfoot with necrosis of muscle: Secondary | ICD-10-CM | POA: Diagnosis not present

## 2024-09-05 DIAGNOSIS — E08621 Diabetes mellitus due to underlying condition with foot ulcer: Secondary | ICD-10-CM

## 2024-09-11 NOTE — Progress Notes (Signed)
 "  Chief Complaint  Patient presents with   Diabetic Ulcer    Pt is here to f/u on left heel due to diabetic ulcer and discuss MRI results.    HPI: 64 y.o. male PMHx T2DM, last A1c was 7.3 on 06/28/2024, for follow-up evaluation of chronic heel ulcer.  Significant improvement over the last few weeks  Past Medical History:  Diagnosis Date   ALCOHOL USE 10/24/2008   ANXIETY 09/19/2007   Cough 05/22/2008   Diabetes mellitus without complication (HCC)    Phreesia 06/02/2020   DIABETES MELLITUS, TYPE II 09/19/2007   External thrombosed hemorrhoids 08/23/2008   GOUT 04/17/2010   HYPERLIPIDEMIA 09/19/2007   HYPERTENSION 09/19/2007   Hypertension    Phreesia 06/02/2020   HYPOTHYROIDISM 09/19/2007   Leg pain    OSTEOARTHRITIS 09/19/2007   OTHER DISEASES OF LUNG NOT ELSEWHERE CLASSIFIED 07/20/2010   Rash and other nonspecific skin eruption 09/19/2007   Restrictive lung disease    SCROTAL ABSCESS 05/27/2010   Sebaceous cyst 08/01/2008   SLEEP APNEA 08/22/2009   Sleep apnea    Phreesia 06/02/2020   SMOKER 04/17/2010   Thyroid  disease    Phreesia 06/02/2020    Past Surgical History:  Procedure Laterality Date   APPLICATION OF WOUND VAC Left 06/28/2024   Procedure: APPLICATION, WOUND VAC;  Surgeon: Elsa Lonni SAUNDERS, MD;  Location: [facility] OR;  Service: Orthopedics;  Laterality: Left;   COLONOSCOPY     INCISION AND DRAINAGE OF WOUND Left 06/28/2024   Procedure: IRRIGATION AND DEBRIDEMENT WOUND;  Surgeon: Elsa Lonni SAUNDERS, MD;  Location: [facility] OR;  Service: Orthopedics;  Laterality: Left;   NASAL SEPTUM SURGERY      Allergies[1]   LT heel 08/01/2024   LT heel 09/03/2024   Physical Exam: General: The patient is alert and oriented x3 in no acute distress.  Dermatology: Significant improvement.  No surrounding erythema or edema concerning for an acute cellulitis or acute osteomyelitis.  Vascular: Clinically no concern for vascular compromise.  Pulses  palpable  Neurological: Light touch and protective threshold diminished  Musculoskeletal Exam: No pedal deformities noted.  Patient ambulatory  MR HEEL LEFT WO CONTRAST (Accession [id number]) (Order [id number]) Imaging Date: 08/04/2024 Department: [facility] Released By: Edith Lesley BROCKS Authorizing: Janit Thresa HERO, DPM  IMPRESSION: 1. Skin wound along the lateral margin of the heel with a small underlying fluid collection consistent with an abscess. There is osteomyelitis in the posterior and lateral sides of the calcaneus. 2. Diffuse fatty atrophy of intrinsic musculature of the foot consistent with diabetic myopathy.  Assessment/Plan of Care: 1.  Chronic diabetic ulcer posterior aspect of the left heel  -Patient evaluated.  Imaging and chart reviewed as well as notes and recommendations from infectious disease; Dr. Constance Passer.  Greatly appreciated -Findings on the MRI were reviewed in length in detail with the patient.  Hard to determine if the edema around the heel is consistent with postoperative changes versus actual cellulitis and osteomyelitis -Currently the wound appears to be improving and there is no significant indication of acute osteomyelitis or cellulitis.  He has been off of antibiotics for the last few weeks and there has been stability improvement of the foot  -Continue dressing changes of the posterior heel  Special Instructions: LT heel dressing changes 3x/week: -cleanse with normal saline -pack wound with saline gauze -dress w/ heel cusion protector, abd, large kerlex, ace wrap        - For now the patient is wanting to simply observe  and continue conservative care prior to any additional surgery or invasive procedures -Return to clinic 4 weeks   Thresa EMERSON Sar, DPM [facility]  Dr. Thresa EMERSON Sar, DPM    [address] [place], [place] [address]                Office [phone]  Fax  [phone]        [1]  Allergies Allergen Reactions   Other     Dust mites   "

## 2024-09-13 ENCOUNTER — Ambulatory Visit: Admitting: Internal Medicine

## 2024-09-13 ENCOUNTER — Other Ambulatory Visit: Payer: Self-pay | Admitting: Emergency Medicine

## 2024-09-13 DIAGNOSIS — G8929 Other chronic pain: Secondary | ICD-10-CM

## 2024-09-27 ENCOUNTER — Encounter (HOSPITAL_BASED_OUTPATIENT_CLINIC_OR_DEPARTMENT_OTHER): Admitting: Internal Medicine
